# Patient Record
Sex: Female | Born: 1950 | Race: White | Hispanic: No | Smoking: Former smoker
Health system: Southern US, Community
[De-identification: ages and names within clinical notes are randomized; demographics above are authoritative.]

## PROBLEM LIST (undated history)

## (undated) DIAGNOSIS — E785 Hyperlipidemia, unspecified: Secondary | ICD-10-CM

## (undated) DIAGNOSIS — K219 Gastro-esophageal reflux disease without esophagitis: Secondary | ICD-10-CM

## (undated) DIAGNOSIS — I700 Atherosclerosis of aorta: Secondary | ICD-10-CM

## (undated) DIAGNOSIS — R911 Solitary pulmonary nodule: Secondary | ICD-10-CM

## (undated) DIAGNOSIS — I10 Essential (primary) hypertension: Secondary | ICD-10-CM

## (undated) DIAGNOSIS — Z8719 Personal history of other diseases of the digestive system: Secondary | ICD-10-CM

## (undated) DIAGNOSIS — D6851 Activated protein C resistance: Secondary | ICD-10-CM

## (undated) DIAGNOSIS — I2699 Other pulmonary embolism without acute cor pulmonale: Secondary | ICD-10-CM

## (undated) DIAGNOSIS — J439 Emphysema, unspecified: Secondary | ICD-10-CM

## (undated) DIAGNOSIS — I87002 Postthrombotic syndrome without complications of left lower extremity: Secondary | ICD-10-CM

## (undated) DIAGNOSIS — N12 Tubulo-interstitial nephritis, not specified as acute or chronic: Secondary | ICD-10-CM

## (undated) DIAGNOSIS — I341 Nonrheumatic mitral (valve) prolapse: Secondary | ICD-10-CM

## (undated) DIAGNOSIS — J90 Pleural effusion, not elsewhere classified: Secondary | ICD-10-CM

## (undated) DIAGNOSIS — Z1509 Genetic susceptibility to other malignant neoplasm: Secondary | ICD-10-CM

## (undated) DIAGNOSIS — D6859 Other primary thrombophilia: Secondary | ICD-10-CM

## (undated) DIAGNOSIS — I480 Paroxysmal atrial fibrillation: Secondary | ICD-10-CM

## (undated) DIAGNOSIS — R310 Gross hematuria: Secondary | ICD-10-CM

## (undated) DIAGNOSIS — E119 Type 2 diabetes mellitus without complications: Secondary | ICD-10-CM

## (undated) DIAGNOSIS — D689 Coagulation defect, unspecified: Secondary | ICD-10-CM

## (undated) DIAGNOSIS — T457X1A Poisoning by anticoagulant antagonists, vitamin K and other coagulants, accidental (unintentional), initial encounter: Secondary | ICD-10-CM

## (undated) HISTORY — DX: Genetic susceptibility to other malignant neoplasm: Z15.09

## (undated) HISTORY — DX: Other primary thrombophilia: D68.59

## (undated) HISTORY — DX: Postthrombotic syndrome without complications of left lower extremity: I87.002

## (undated) HISTORY — DX: Essential (primary) hypertension: I10

## (undated) HISTORY — DX: Hyperlipidemia, unspecified: E78.5

## (undated) HISTORY — DX: Pleural effusion, not elsewhere classified: J90

## (undated) HISTORY — DX: Type 2 diabetes mellitus without complications: E11.9

## (undated) HISTORY — DX: Gross hematuria: R31.0

## (undated) HISTORY — DX: Other pulmonary embolism without acute cor pulmonale: I26.99

## (undated) HISTORY — DX: Paroxysmal atrial fibrillation: I48.0

## (undated) HISTORY — DX: Tubulo-interstitial nephritis, not specified as acute or chronic: N12

## (undated) HISTORY — DX: Atherosclerosis of aorta: I70.0

## (undated) HISTORY — DX: Coagulation defect, unspecified: D68.9

## (undated) HISTORY — DX: Emphysema, unspecified: J43.9

## (undated) HISTORY — PX: CARDIAC CATHETERIZATION: SHX172

## (undated) HISTORY — PX: COLONOSCOPY: SHX174

## (undated) HISTORY — DX: Nonrheumatic mitral (valve) prolapse: I34.1

## (undated) HISTORY — DX: Poisoning by anticoagulant antagonists, vitamin k and other coagulants, accidental (unintentional), initial encounter: T45.7X1A

---

## 1973-01-21 HISTORY — PX: HYSTERECTOMY ABDOMINAL WITH SALPINGO-OOPHORECTOMY: SHX6792

## 1981-01-21 HISTORY — PX: LYMPH NODE BIOPSY: SHX201

## 1987-01-22 DIAGNOSIS — I469 Cardiac arrest, cause unspecified: Secondary | ICD-10-CM

## 1987-01-22 HISTORY — DX: Cardiac arrest, cause unspecified: I46.9

## 2003-02-27 ENCOUNTER — Emergency Department (HOSPITAL_COMMUNITY): Admission: EM | Admit: 2003-02-27 | Discharge: 2003-02-27 | Payer: Self-pay | Admitting: Emergency Medicine

## 2003-03-03 ENCOUNTER — Ambulatory Visit (HOSPITAL_COMMUNITY): Admission: RE | Admit: 2003-03-03 | Discharge: 2003-03-03 | Payer: Self-pay | Admitting: Unknown Physician Specialty

## 2004-01-07 ENCOUNTER — Emergency Department (HOSPITAL_COMMUNITY): Admission: EM | Admit: 2004-01-07 | Discharge: 2004-01-07 | Payer: Self-pay | Admitting: Family Medicine

## 2004-01-17 ENCOUNTER — Emergency Department (HOSPITAL_COMMUNITY): Admission: EM | Admit: 2004-01-17 | Discharge: 2004-01-17 | Payer: Self-pay | Admitting: Family Medicine

## 2006-09-04 ENCOUNTER — Encounter: Admission: RE | Admit: 2006-09-04 | Discharge: 2006-09-04 | Payer: Self-pay | Admitting: Internal Medicine

## 2012-06-08 ENCOUNTER — Emergency Department (INDEPENDENT_AMBULATORY_CARE_PROVIDER_SITE_OTHER)
Admission: EM | Admit: 2012-06-08 | Discharge: 2012-06-08 | Disposition: A | Discharge status: Home / Self Care | Payer: 59 | Source: Home / Self Care | Attending: Family Medicine | Admitting: Family Medicine

## 2012-06-08 ENCOUNTER — Encounter (HOSPITAL_COMMUNITY): Payer: Self-pay | Admitting: Emergency Medicine

## 2012-06-08 DIAGNOSIS — J40 Bronchitis, not specified as acute or chronic: Secondary | ICD-10-CM

## 2012-06-08 DIAGNOSIS — J309 Allergic rhinitis, unspecified: Secondary | ICD-10-CM

## 2012-06-08 LAB — POCT RAPID STREP A: Streptococcus, Group A Screen (Direct): NEGATIVE

## 2012-06-08 MED ORDER — CETIRIZINE-PSEUDOEPHEDRINE ER 5-120 MG PO TB12: 1.0000 | ORAL_TABLET | Freq: Two times a day (BID) | ORAL | Status: DC | PRN

## 2012-06-08 MED ORDER — PREDNISONE 20 MG PO TABS: ORAL_TABLET | ORAL | Status: DC

## 2012-06-08 MED ORDER — DOXYCYCLINE HYCLATE 100 MG PO CAPS: 100.0000 mg | ORAL_CAPSULE | Freq: Two times a day (BID) | ORAL | Status: DC

## 2012-06-08 MED ORDER — BENZONATATE 100 MG PO CAPS: 100.0000 mg | ORAL_CAPSULE | Freq: Three times a day (TID) | ORAL | Status: DC

## 2012-06-08 MED ORDER — GUAIFENESIN-CODEINE 100-10 MG/5ML PO SYRP: 5.0000 mL | ORAL_SOLUTION | Freq: Three times a day (TID) | ORAL | Status: DC | PRN

## 2012-06-08 MED ORDER — ALBUTEROL SULFATE HFA 108 (90 BASE) MCG/ACT IN AERS: 1.0000 | INHALATION_SPRAY | Freq: Four times a day (QID) | RESPIRATORY_TRACT | Status: DC | PRN

## 2012-06-08 NOTE — ED Notes (Signed)
Pt c/o uri/sinus sx onset Saturday Sx include: cough, sore throat, nasal congestion, itchy eyes, facial pressure, fevers Denies: v/n/d Taking mucinex w/no relief Smokes 0.5 PPD  She is alert and oriented w/no signs of acute distress.

## 2012-06-08 NOTE — ED Provider Notes (Signed)
History     CSN: [id number]  Arrival date & time 06/08/12  1431   First MD Initiated Contact with Patient 06/08/12 1537      Chief Complaint  Patient presents with  . URI    (Consider location/radiation/quality/duration/timing/severity/associated sxs/prior treatment) HPI Comments: 62 year old female smoker. Here complaining of nasal congestion, productive cough of a clear sputum. Sinus pressure. Sneezing watery/itchy eyes. Symptoms also associated with wheezing especially at nighttime. Patient taking Mucinex with no significant relief. Denies chest pain or shortness of breath. Reports she felt subjective fever last night. Patient still smoking. Interested in smoke infection. Appetite is normal. Activity level is normal. Has not taken any fever medications today and her temperature is 98.1 Fahrenheit.   History reviewed. No pertinent past medical history.  History reviewed. No pertinent past surgical history.  No family history on file.  History  Substance Use Topics  . Smoking status: Current Every Day Smoker -- 0.50 packs/day    Types: Cigarettes  . Smokeless tobacco: Not on file  . Alcohol Use: No    OB History   Grav Para Term Preterm Abortions TAB SAB Ect Mult Living                  Review of Systems  Constitutional: Negative for chills, diaphoresis, activity change and appetite change.  HENT: Positive for congestion, sore throat, rhinorrhea, sneezing and sinus pressure. Negative for trouble swallowing.   Eyes: Positive for itching. Negative for discharge.  Respiratory: Positive for cough and wheezing.   Cardiovascular: Negative for chest pain and leg swelling.  Gastrointestinal: Negative for nausea, vomiting, abdominal pain and diarrhea.  Skin: Positive for rash.  Allergic/Immunologic: Positive for environmental allergies.  All other systems reviewed and are negative.    Allergies  Review of patient's allergies indicates no known allergies.  Home  Medications   Current Outpatient Rx  Name  Route  Sig  Dispense  Refill  . albuterol (PROVENTIL HFA;VENTOLIN HFA) 108 (90 BASE) MCG/ACT inhaler   Inhalation   Inhale 1-2 puffs into the lungs every 6 (six) hours as needed for wheezing.   1 Inhaler   0   . benzonatate (TESSALON) 100 MG capsule   Oral   Take 1 capsule (100 mg total) by mouth every 8 (eight) hours.   21 capsule   0   . cetirizine-pseudoephedrine (ZYRTEC-D) 5-120 MG per tablet   Oral   Take 1 tablet by mouth 2 (two) times daily as needed for allergies or rhinitis.   30 tablet   0   . doxycycline (VIBRAMYCIN) 100 MG capsule   Oral   Take 1 capsule (100 mg total) by mouth 2 (two) times daily.   20 capsule   0   . guaiFENesin-codeine (ROBITUSSIN AC) 100-10 MG/5ML syrup   Oral   Take 5 mLs by mouth 3 (three) times daily as needed for cough.   120 mL   0   . predniSONE (DELTASONE) 20 MG tablet      2 tabs by mouth daily for 5 days   10 tablet   0     BP 133/75  Pulse 78  Temp(Src) 98.1 F (36.7 C) (Oral)  Resp 20  SpO2 96%  Physical Exam  Constitutional: She is oriented to person, place, and time. She appears well-developed and well-nourished. No distress.  HENT:  Head: Normocephalic and atraumatic.  Right Ear: External ear normal.  Left Ear: External ear normal.  Nasal Congestion with erythema and swelling of nasal  turbinates, clear rhinorrhea. pharyngeal erythema no exudates. No uvula deviation. No trismus. TM's normal  Neck: No JVD present.  Cardiovascular: Normal rate, regular rhythm and normal heart sounds.   Pulmonary/Chest:  No respiratory distress. No tachypnea. Expiratory rhonchi bilaterally. No active wheezing. No rales.  Neurological: She is alert and oriented to person, place, and time.  Skin: No rash noted. She is not diaphoretic.    ED Course  Procedures (including critical care time)  Labs Reviewed  POCT RAPID STREP A ([facility] CARE ONLY)   No results found.   1.  Bronchitis   2. Allergic rhinitis       MDM  Negative strep. Nontoxic appearance. Increased sputum production and bilateral expiratory rhonchi concerning for bronchitis. Prescribed albuterol, prednisone, doxycycline, cetirizine/pseudoephedrine, Tessalon Perles and guaifenesin/codeine. Encouraged smoking cessation. Provided with contact information for primary care offices in our area. Supportive care and red flags that should prompt her return to medical attention discussed with patient and provided in writing.        Sharin Grave, MD 06/08/12 [phone]

## 2016-03-14 DIAGNOSIS — B349 Viral infection, unspecified: Secondary | ICD-10-CM | POA: Diagnosis not present

## 2016-03-14 DIAGNOSIS — J029 Acute pharyngitis, unspecified: Secondary | ICD-10-CM | POA: Diagnosis not present

## 2017-08-11 ENCOUNTER — Other Ambulatory Visit (HOSPITAL_COMMUNITY): Payer: Self-pay | Admitting: Internal Medicine

## 2017-08-11 ENCOUNTER — Other Ambulatory Visit: Payer: Self-pay | Admitting: Internal Medicine

## 2017-08-11 DIAGNOSIS — J301 Allergic rhinitis due to pollen: Secondary | ICD-10-CM | POA: Diagnosis not present

## 2017-08-11 DIAGNOSIS — Z803 Family history of malignant neoplasm of breast: Secondary | ICD-10-CM | POA: Diagnosis not present

## 2017-08-11 DIAGNOSIS — Z1231 Encounter for screening mammogram for malignant neoplasm of breast: Secondary | ICD-10-CM

## 2017-08-11 DIAGNOSIS — Z720 Tobacco use: Secondary | ICD-10-CM | POA: Diagnosis not present

## 2017-08-11 DIAGNOSIS — I341 Nonrheumatic mitral (valve) prolapse: Secondary | ICD-10-CM | POA: Diagnosis not present

## 2017-08-11 DIAGNOSIS — Z1329 Encounter for screening for other suspected endocrine disorder: Secondary | ICD-10-CM | POA: Diagnosis not present

## 2017-08-11 DIAGNOSIS — Z136 Encounter for screening for cardiovascular disorders: Secondary | ICD-10-CM | POA: Diagnosis not present

## 2017-08-11 DIAGNOSIS — Z1239 Encounter for other screening for malignant neoplasm of breast: Secondary | ICD-10-CM | POA: Diagnosis not present

## 2017-08-11 DIAGNOSIS — Z8041 Family history of malignant neoplasm of ovary: Secondary | ICD-10-CM | POA: Diagnosis not present

## 2017-08-14 ENCOUNTER — Ambulatory Visit (HOSPITAL_COMMUNITY): Payer: Medicare HMO | Attending: Cardiology

## 2017-08-14 ENCOUNTER — Other Ambulatory Visit: Payer: Self-pay

## 2017-08-14 DIAGNOSIS — I341 Nonrheumatic mitral (valve) prolapse: Secondary | ICD-10-CM | POA: Diagnosis not present

## 2017-08-14 DIAGNOSIS — I472 Ventricular tachycardia: Secondary | ICD-10-CM | POA: Insufficient documentation

## 2017-08-14 DIAGNOSIS — I5189 Other ill-defined heart diseases: Secondary | ICD-10-CM | POA: Insufficient documentation

## 2017-08-14 DIAGNOSIS — I517 Cardiomegaly: Secondary | ICD-10-CM | POA: Diagnosis not present

## 2017-08-22 DIAGNOSIS — Z720 Tobacco use: Secondary | ICD-10-CM | POA: Diagnosis not present

## 2017-08-22 DIAGNOSIS — I341 Nonrheumatic mitral (valve) prolapse: Secondary | ICD-10-CM | POA: Diagnosis not present

## 2017-08-22 DIAGNOSIS — Z23 Encounter for immunization: Secondary | ICD-10-CM | POA: Diagnosis not present

## 2017-08-22 DIAGNOSIS — Z803 Family history of malignant neoplasm of breast: Secondary | ICD-10-CM | POA: Diagnosis not present

## 2017-08-26 ENCOUNTER — Other Ambulatory Visit: Payer: Self-pay | Admitting: Internal Medicine

## 2017-08-26 DIAGNOSIS — F17200 Nicotine dependence, unspecified, uncomplicated: Secondary | ICD-10-CM

## 2017-08-26 DIAGNOSIS — Z87891 Personal history of nicotine dependence: Secondary | ICD-10-CM

## 2017-09-02 ENCOUNTER — Ambulatory Visit
Admission: RE | Admit: 2017-09-02 | Discharge: 2017-09-02 | Disposition: A | Discharge status: Home / Self Care | Payer: Medicare HMO | Source: Ambulatory Visit | Attending: Internal Medicine | Admitting: Internal Medicine

## 2017-09-02 DIAGNOSIS — Z1231 Encounter for screening mammogram for malignant neoplasm of breast: Secondary | ICD-10-CM

## 2017-09-09 ENCOUNTER — Ambulatory Visit: Payer: Medicare HMO

## 2017-12-01 ENCOUNTER — Other Ambulatory Visit: Payer: Self-pay | Admitting: Internal Medicine

## 2017-12-01 DIAGNOSIS — Z0000 Encounter for general adult medical examination without abnormal findings: Secondary | ICD-10-CM | POA: Diagnosis not present

## 2017-12-01 DIAGNOSIS — I5189 Other ill-defined heart diseases: Secondary | ICD-10-CM | POA: Diagnosis not present

## 2017-12-01 DIAGNOSIS — Z8679 Personal history of other diseases of the circulatory system: Secondary | ICD-10-CM | POA: Diagnosis not present

## 2017-12-01 DIAGNOSIS — E2839 Other primary ovarian failure: Secondary | ICD-10-CM

## 2017-12-01 DIAGNOSIS — Z1211 Encounter for screening for malignant neoplasm of colon: Secondary | ICD-10-CM | POA: Diagnosis not present

## 2017-12-01 DIAGNOSIS — Z1389 Encounter for screening for other disorder: Secondary | ICD-10-CM | POA: Diagnosis not present

## 2017-12-01 DIAGNOSIS — E559 Vitamin D deficiency, unspecified: Secondary | ICD-10-CM | POA: Diagnosis not present

## 2017-12-01 DIAGNOSIS — Z119 Encounter for screening for infectious and parasitic diseases, unspecified: Secondary | ICD-10-CM | POA: Diagnosis not present

## 2017-12-01 DIAGNOSIS — Z720 Tobacco use: Secondary | ICD-10-CM | POA: Diagnosis not present

## 2018-02-03 ENCOUNTER — Ambulatory Visit
Admission: RE | Admit: 2018-02-03 | Discharge: 2018-02-03 | Disposition: A | Discharge status: Home / Self Care | Payer: Medicare HMO | Source: Ambulatory Visit | Attending: Internal Medicine | Admitting: Internal Medicine

## 2018-02-03 DIAGNOSIS — M8589 Other specified disorders of bone density and structure, multiple sites: Secondary | ICD-10-CM | POA: Diagnosis not present

## 2018-02-03 DIAGNOSIS — E2839 Other primary ovarian failure: Secondary | ICD-10-CM

## 2018-02-03 DIAGNOSIS — Z780 Asymptomatic menopausal state: Secondary | ICD-10-CM | POA: Diagnosis not present

## 2018-02-10 DIAGNOSIS — Z720 Tobacco use: Secondary | ICD-10-CM | POA: Diagnosis not present

## 2018-02-10 DIAGNOSIS — M8580 Other specified disorders of bone density and structure, unspecified site: Secondary | ICD-10-CM | POA: Diagnosis not present

## 2018-02-10 DIAGNOSIS — Z6379 Other stressful life events affecting family and household: Secondary | ICD-10-CM | POA: Diagnosis not present

## 2018-11-16 ENCOUNTER — Other Ambulatory Visit: Payer: Self-pay

## 2018-11-16 ENCOUNTER — Ambulatory Visit (INDEPENDENT_AMBULATORY_CARE_PROVIDER_SITE_OTHER): Payer: Medicare HMO | Admitting: Internal Medicine

## 2018-11-16 ENCOUNTER — Encounter: Payer: Self-pay | Admitting: Internal Medicine

## 2018-11-16 VITALS — BP 181/88 | HR 80 | Temp 98.2°F | Ht 64.0 in | Wt 165.4 lb

## 2018-11-16 DIAGNOSIS — I10 Essential (primary) hypertension: Secondary | ICD-10-CM | POA: Diagnosis not present

## 2018-11-16 DIAGNOSIS — Z634 Disappearance and death of family member: Secondary | ICD-10-CM

## 2018-11-16 DIAGNOSIS — Z1509 Genetic susceptibility to other malignant neoplasm: Secondary | ICD-10-CM

## 2018-11-16 DIAGNOSIS — F17210 Nicotine dependence, cigarettes, uncomplicated: Secondary | ICD-10-CM

## 2018-11-16 DIAGNOSIS — M7989 Other specified soft tissue disorders: Secondary | ICD-10-CM

## 2018-11-16 DIAGNOSIS — I87002 Postthrombotic syndrome without complications of left lower extremity: Secondary | ICD-10-CM | POA: Insufficient documentation

## 2018-11-16 DIAGNOSIS — I341 Nonrheumatic mitral (valve) prolapse: Secondary | ICD-10-CM | POA: Diagnosis not present

## 2018-11-16 DIAGNOSIS — Z8041 Family history of malignant neoplasm of ovary: Secondary | ICD-10-CM

## 2018-11-16 DIAGNOSIS — Z8049 Family history of malignant neoplasm of other genital organs: Secondary | ICD-10-CM | POA: Diagnosis not present

## 2018-11-16 DIAGNOSIS — Z803 Family history of malignant neoplasm of breast: Secondary | ICD-10-CM

## 2018-11-16 DIAGNOSIS — Z800 Family history of malignant neoplasm of digestive organs: Secondary | ICD-10-CM | POA: Diagnosis not present

## 2018-11-16 DIAGNOSIS — Z720 Tobacco use: Secondary | ICD-10-CM | POA: Insufficient documentation

## 2018-11-16 HISTORY — DX: Disappearance and death of family member: Z63.4

## 2018-11-16 LAB — BRAIN NATRIURETIC PEPTIDE: B Natriuretic Peptide: 45.7 pg/mL (ref 0.0–100.0)

## 2018-11-16 NOTE — Progress Notes (Signed)
   CC: Lower extremity swelling   HPI:  [name] is a 68 y.o. female with PMHx listed below presenting for lower extremity swelling. Please see the A&P for the status of the patient's chronic medical problems.  No past medical history on file.   Family History  Problem Relation Age of Onset  . Diabetes Mother   . Diabetes Father   . CAD Father   . Hypothyroidism Sister   . Melanoma Sister   . Ovarian cancer Other   . Uterine cancer Other   . Breast cancer Other   . Diabetes Son   . Uterine cancer Niece   . Pancreatic cancer Nephew     Past Surgical History:  Procedure Laterality Date  . 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 She has two twin boys, five grandchildren, and two great-grandchildren. One of her sons, his 61 year old daughter, and her two grandchildren ages one in two live with her. She is widowed. She initially had one brothers and four sister however one of her sisters has passed one of her brothers has passed.  She is a current smoker. She smokes one half pack per day. She denies use of alcohol or illicit substances.  Review of Systems:  Performed and all others negative.  Physical Exam: Vitals:   11/16/18 1312  BP: (!) 181/88  Pulse: 80  Temp: 98.2 F (36.8 C)  TempSrc: Oral  SpO2: 97%  Weight: 165 lb 6.4 oz (75 kg)  Height: 5\' 4"  (1.626 m)   General: Well nourished female in no acute distress HENT: Normocephalic, atraumatic, moist mucus membranes Pulm: Good air movement with no wheezing or crackles  CV: RRR, no murmurs, no rubs  Abdomen: Active bowel sounds, soft, non-distended, no tenderness to palpation  Extremities: Pulses palpable in all extremities, bilateral pitting edema to the knees Skin: Warm and dry  Neuro: Alert and oriented x 3  Assessment & Plan:   See Encounters Tab for problem based charting.  Patient discussed with Dr.  Lynnae January

## 2018-11-16 NOTE — Assessment & Plan Note (Addendum)
Patient noted to have a high blood pressure on physical exam. She states that she has never been told she had hypertension but does note that she was diagnosed with preeclampsia at 68 y.o. she has never been on a blood pressure medication. She denies signs or symptoms of an organ damage including memory changes, visual changes, headaches, shortness of breath, chest pain, abdominal pain, changes in urine.  A/P: - We discussed that given her degree of elevation she will likely need blood pressure medication. She will come back in one week for nursing blood pressure check which point we will likely need further medication if it is elevated. - Check BMP and protein to creatinine ratio

## 2018-11-16 NOTE — Patient Instructions (Addendum)
Thank you for allowing [place] to provide your care. Today we discussed:  1) Family history of cancer. Please let me know if you're interested in seeing a genetic counselor and to receive further testing.  2) Hypertension. Your blood pressure was elevated today. We will have you back in one week for blood pressure check. If it is high at that point we will need to start medications.  3) Lower extremity swelling. We're gonna check some blood work today to determine what is causing your lower extremity swelling. I will call you when I have the results and we will discuss if there is anything further we need to do.  Please come back in one week for blood pressure check.

## 2018-11-17 DIAGNOSIS — Z1509 Genetic susceptibility to other malignant neoplasm: Secondary | ICD-10-CM | POA: Insufficient documentation

## 2018-11-17 LAB — CMP14 + ANION GAP
ALT: 20 IU/L (ref 0–32)
AST: 16 IU/L (ref 0–40)
Albumin/Globulin Ratio: 2 (ref 1.2–2.2)
Albumin: 4.1 g/dL (ref 3.8–4.8)
Alkaline Phosphatase: 81 IU/L (ref 39–117)
Anion Gap: 13 mmol/L (ref 10.0–18.0)
BUN/Creatinine Ratio: 22 (ref 12–28)
BUN: 17 mg/dL (ref 8–27)
Bilirubin Total: 0.3 mg/dL (ref 0.0–1.2)
CO2: 25 mmol/L (ref 20–29)
Calcium: 9.8 mg/dL (ref 8.7–10.3)
Chloride: 103 mmol/L (ref 96–106)
Creatinine, Ser: 0.78 mg/dL (ref 0.57–1.00)
GFR calc Af Amer: 90 mL/min/{1.73_m2} (ref >59)
GFR calc non Af Amer: 78 mL/min/{1.73_m2} (ref >59)
Globulin, Total: 2.1 g/dL (ref 1.5–4.5)
Glucose: 86 mg/dL (ref 65–99)
Potassium: 5.2 mmol/L (ref 3.5–5.2)
Sodium: 141 mmol/L (ref 134–144)
Total Protein: 6.2 g/dL (ref 6.0–8.5)

## 2018-11-17 LAB — LIPID PANEL
Chol/HDL Ratio: 2.9 ratio (ref 0.0–4.4)
Cholesterol, Total: 168 mg/dL (ref 100–199)
HDL: 58 mg/dL (ref >39)
LDL Chol Calc (NIH): 85 mg/dL (ref 0–99)
Triglycerides: 146 mg/dL (ref 0–149)
VLDL Cholesterol Cal: 25 mg/dL (ref 5–40)

## 2018-11-17 LAB — PROTEIN / CREATININE RATIO, URINE
Creatinine, Urine: 43.7 mg/dL
Protein, Ur: <4 mg/dL

## 2018-11-17 LAB — HEMOGLOBIN A1C
Est. average glucose Bld gHb Est-mCnc: 131 mg/dL
Hgb A1c MFr Bld: 6.2 % — ABNORMAL HIGH (ref 4.8–5.6)

## 2018-11-17 NOTE — Assessment & Plan Note (Signed)
Patient with history of mitral valve prolapse noted on previous echo in 2019. Is not further imaging since that time. She denies shortness of breath, orthopnea, or chest pain.

## 2018-11-17 NOTE — Assessment & Plan Note (Signed)
Patient has had a lot of family losses over the past year. She lost her brother in September and one of her sisters was recently diagnosed with metastatic breast cancer. She is not had any changes in sleep, interest, feelings of guilt, energy, or concentration. She is a good family support. She states that her son and his 69 year old daughter lives with her. In addition she is one-year-old and a two-year-old living with her.  A/P: - She does not meet the definition her major depressive disorder. - Continue to monitor

## 2018-11-17 NOTE — Assessment & Plan Note (Signed)
Currently smokes 1/2 pack per day. She is not interested in quitting at this point. Advised for cessation.

## 2018-11-17 NOTE — Assessment & Plan Note (Signed)
Patient presents for evaluation of lower extremity swelling. She states that is been present for the past 2 to 3 weeks. She notes that sometimes it does improve with elevation of her legs but other times it does not. She is unable to speak to whether she has any orthopnea as she sleeps with 2 to 3 pillows at night. She denies chest pain or shortness of breath she denies a significant alcohol use history. She denies changes in urine output.  A/P: - Check BNP, CMP, and protein to creatinine ratio - May need repeat echo - Symptoms are cosmetic. Will hold on diuretic therapy at this point.

## 2018-11-17 NOTE — Assessment & Plan Note (Signed)
On review of family history patient's mother was diagnosed with ovarian cancer, a niece was diagnosed with uterine cancer, and nephew was diagnosed with pancreatic cancer, and his sister has been diagnosed with breast cancer. She is never had genetic counseling. She is unsure if any of her relatives have been tested. We discussed that given her family history she may need referral to genetic counseling. She currently has five granddaughters and one great granddaughter. She will discuss it with her granddaughters and let us know if she like to be referred to genetic counseling.

## 2018-11-18 ENCOUNTER — Telehealth: Payer: Self-pay | Admitting: Internal Medicine

## 2018-11-18 DIAGNOSIS — M7989 Other specified soft tissue disorders: Secondary | ICD-10-CM

## 2018-11-18 MED ORDER — FUROSEMIDE 20 MG PO TABS: 20.0000 mg | ORAL_TABLET | Freq: Every day | ORAL | 0 refills | Status: DC | PRN

## 2018-11-18 NOTE — Telephone Encounter (Signed)
Called the patient to discuss her lab work. We will start furosemide 20 mg once daily. We discussed her elevated A1c and the need to repeat it at her next visit. She eats a lot of candy and she is gonna try to decrease her candy intake. She will be by Monday for blood pressure check. I which point if her blood pressure is still elevated we will need to start medications. All questions and concerns addressed.  Ina Homes, MD

## 2018-11-18 NOTE — Progress Notes (Signed)
Internal Medicine Clinic Attending  Case discussed with Dr. Helberg at the time of the visit.  We reviewed the resident's history and exam and pertinent patient test results.  I agree with the assessment, diagnosis, and plan of care documented in the resident's note.    

## 2018-11-23 ENCOUNTER — Encounter: Payer: Self-pay | Admitting: Internal Medicine

## 2018-11-23 ENCOUNTER — Ambulatory Visit (INDEPENDENT_AMBULATORY_CARE_PROVIDER_SITE_OTHER): Payer: Medicare HMO | Admitting: Internal Medicine

## 2018-11-23 ENCOUNTER — Other Ambulatory Visit: Payer: Self-pay

## 2018-11-23 VITALS — BP 168/79 | HR 83 | Temp 97.9°F | Ht 64.0 in | Wt 163.0 lb

## 2018-11-23 DIAGNOSIS — I10 Essential (primary) hypertension: Secondary | ICD-10-CM

## 2018-11-23 DIAGNOSIS — R7303 Prediabetes: Secondary | ICD-10-CM

## 2018-11-23 DIAGNOSIS — Z23 Encounter for immunization: Secondary | ICD-10-CM

## 2018-11-23 MED ORDER — LISINOPRIL 10 MG PO TABS: 10.0000 mg | ORAL_TABLET | Freq: Every day | ORAL | 2 refills | Status: DC

## 2018-11-23 NOTE — Progress Notes (Signed)
   CC: Hypertension follow-up  HPI: Patient is a 68 year old female with past medical history of prediabetes and grade 1 diastolic dysfunction who presents for follow-up on elevated blood pressure.  [name] is a 68 y.o.   No past medical history on file. Review of Systems:   Review of Systems  Respiratory: Negative for shortness of breath.   Cardiovascular: Negative for chest pain.  Gastrointestinal: Negative for abdominal pain.  Neurological: Negative for headaches.  All other systems reviewed and are negative.  Physical Exam:  Vitals:   11/23/18 1459  BP: (!) 168/79  Pulse: 83  Temp: 97.9 F (36.6 C)  TempSrc: Oral  SpO2: 95%  Weight: 163 lb (73.9 kg)  Height: 5\' 4"  (1.626 m)   Physical Exam  Constitutional: She is well-developed, well-nourished, and in no distress.  HENT:  Head: Normocephalic and atraumatic.  Eyes: EOM are normal. Right eye exhibits no discharge. Left eye exhibits no discharge.  Neck: Normal range of motion. No tracheal deviation present.  Cardiovascular: Normal rate and regular rhythm. Exam reveals no gallop and no friction rub.  No murmur heard. Pulmonary/Chest: Effort normal and breath sounds normal. No respiratory distress. She has no wheezes. She has no rales.  Abdominal: Soft. She exhibits no distension. There is no abdominal tenderness. There is no rebound and no guarding.  Musculoskeletal: Normal range of motion.        General: Edema (Trace peripheral edema of bilateral ankles) present. No tenderness or deformity.  Neurological: She is alert. Coordination normal.  Skin: Skin is warm and dry. No rash noted. She is not diaphoretic. No erythema.  Psychiatric: Memory and judgment normal.    Assessment & Plan:   See Encounters Tab for problem based charting.  Patient seen and discussed with Dr. Philipp Ovens

## 2018-11-23 NOTE — Assessment & Plan Note (Addendum)
Patient with blood pressure of 181/88 at last visit.  Readings today of 168/79 and 163/81 on repeat.  Patient without signs/symptoms of hypertension related organ damage including headache, vision changes, chest pain, shortness of breath, abdominal pain.  Patient states she is trying to modify her diet to lose weight.  Patient likely to benefit from antihypertensive therapy, lisinopril selected due to prediabetic A1c.  Patient with undetectable urine protein excretion, assessed at last visit. * Prescription sent for lisinopril 10 mg daily * Patient advised on benefits of weight loss and low-salt diet * Follow-up appointment in 4 weeks to check creatinine and adjust dose as necessary

## 2018-11-23 NOTE — Patient Instructions (Addendum)
You were seen today for follow-up on your blood pressure.  Given your elevated blood pressure, we think he would benefit from starting medication.  We sent a prescription for lisinopril to your pharmacy.  Want you to follow-up in 4 weeks for some blood work to check your kidney function.  Your blood pressure can definitely be decreased with diet and exercise as well.  If your blood pressure starts to improve with weight loss we could potentially decrease or stop the medication. A very small percentage of people can experience lip or facial swelling from this medicine.  Stop taking the medication and call the clinic immediately if you experience this symptom.    Thank you for allowing [place] to be part of your medical care

## 2018-11-24 NOTE — Progress Notes (Signed)
Internal Medicine Clinic Attending  I saw and evaluated the patient.  I personally confirmed the key portions of the history and exam documented by Dr. MacLean and I reviewed pertinent patient test results.  The assessment, diagnosis, and plan were formulated together and I agree with the documentation in the resident's note.  

## 2018-12-10 ENCOUNTER — Encounter: Payer: Self-pay | Admitting: *Deleted

## 2018-12-14 ENCOUNTER — Encounter: Payer: Medicare HMO | Admitting: Internal Medicine

## 2018-12-21 ENCOUNTER — Ambulatory Visit (INDEPENDENT_AMBULATORY_CARE_PROVIDER_SITE_OTHER): Payer: Medicare HMO | Admitting: Internal Medicine

## 2018-12-21 ENCOUNTER — Other Ambulatory Visit: Payer: Self-pay

## 2018-12-21 ENCOUNTER — Encounter: Payer: Medicare HMO | Admitting: Internal Medicine

## 2018-12-21 ENCOUNTER — Encounter: Payer: Self-pay | Admitting: Internal Medicine

## 2018-12-21 VITALS — BP 147/71 | HR 79 | Temp 97.6°F | Ht 64.0 in | Wt 164.5 lb

## 2018-12-21 DIAGNOSIS — Z79899 Other long term (current) drug therapy: Secondary | ICD-10-CM | POA: Diagnosis not present

## 2018-12-21 DIAGNOSIS — I10 Essential (primary) hypertension: Secondary | ICD-10-CM

## 2018-12-21 LAB — BASIC METABOLIC PANEL
Anion gap: 8 (ref 5–15)
BUN: 15 mg/dL (ref 8–23)
CO2: 28 mmol/L (ref 22–32)
Calcium: 9.2 mg/dL (ref 8.9–10.3)
Chloride: 104 mmol/L (ref 98–111)
Creatinine, Ser: 0.78 mg/dL (ref 0.44–1.00)
GFR calc Af Amer: >60 mL/min (ref >60)
GFR calc non Af Amer: >60 mL/min (ref >60)
Glucose, Bld: 113 mg/dL — ABNORMAL HIGH (ref 70–99)
Potassium: 4.1 mmol/L (ref 3.5–5.1)
Sodium: 140 mmol/L (ref 135–145)

## 2018-12-21 NOTE — Patient Instructions (Addendum)
Kimberly Walters,  It was a pleasure meeting you this afternoon! Today we discussed your blood pressure. Your at home measurements appear to be well controlled. Today we will draw some blood to see how your kidney's are doing. We will increase your medication from 10 mg to 20 mg, so please take 2 pills daily. If you notice any side effects such as dizziness or lightheadedness, please call the clinic. It was a pleasure working with you, have an enjoyable day!  Sincerely,  Maudie Mercury, MD

## 2018-12-21 NOTE — Assessment & Plan Note (Addendum)
Patient arrived to the clinic today for F/U on her hypertension. BP today was 147/71 with a HR of 79. Patient did not bring her BP journal, but states her systolic pressures have been in the 120-130s with two pressures in the 140s. Her diastolic pressures have been in the 70s. Patient states that she has been compliant with her 10mg  Zestril QD. Patient denies side effects of her medication. Patient is in good health, overall. To further reduce her risk of CAD, CKD, and stroke, her zestril dose will be increased to 20 mg QD for tighter control of her blood pressure.   Plan:  - Increase Zestril  To 20 mg QD.  - Patient educated on side effects of Zestril and will reach out to the clinic if she develops any adverse effects.  - BMP to assess Cr level.

## 2018-12-21 NOTE — Progress Notes (Signed)
   [id number]  HPI:  [name] is a 68 y.o. , with a PMH noted below, who presents to the clinic for a follow up on her hypertension. To see the management of her acute and chronic conditions, please see the A&P note under the encounter tab.   Past Medical History:  Diagnosis Date  . Hypertension    Review of Systems:   Review of Systems  Constitutional: Negative for malaise/fatigue and weight loss.  Eyes: Negative for blurred vision.  Respiratory: Negative for shortness of breath.   Cardiovascular: Negative for chest pain and palpitations.  Gastrointestinal: Negative for abdominal pain, blood in stool, constipation, diarrhea, melena, nausea and vomiting.  Musculoskeletal: Negative for myalgias.  Neurological: Negative for dizziness and headaches.     Physical Exam:  Vitals:   12/21/18 1330  BP: (!) 147/71  Pulse: 79  Temp: 97.6 F (36.4 C)  TempSrc: Oral  SpO2: 96%  Weight: 164 lb 8 oz (74.6 kg)  Height: 5\' 4"  (1.626 m)   Physical Exam Vitals signs and nursing note reviewed.  Constitutional:      General: She is not in acute distress.    Appearance: Normal appearance. She is not ill-appearing or toxic-appearing.     Comments: Patient sitting in her chair comfortably, no noticeable acute distress.   HENT:     Head: Normocephalic and atraumatic.  Eyes:     General:        Right eye: No discharge.        Left eye: No discharge.     Conjunctiva/sclera: Conjunctivae normal.  Cardiovascular:     Rate and Rhythm: Normal rate and regular rhythm.     Pulses: Normal pulses.     Heart sounds: Normal heart sounds. No murmur. No friction rub. No gallop.   Pulmonary:     Effort: Pulmonary effort is normal.     Breath sounds: Normal breath sounds. No wheezing, rhonchi or rales.  Abdominal:     General: Bowel sounds are normal.     Palpations: Abdomen is soft.     Tenderness: There is no abdominal tenderness. There is no guarding.  Musculoskeletal:      General: No swelling or tenderness.     Right lower leg: No edema.     Left lower leg: No edema.  Neurological:     General: No focal deficit present.     Mental Status: She is alert and oriented to person, place, and time.  Psychiatric:        Mood and Affect: Mood normal.        Behavior: Behavior normal.      Assessment & Plan:   See Encounters Tab for problem based charting.  Patient seen with Dr. Evette Doffing

## 2018-12-23 NOTE — Progress Notes (Signed)
Internal Medicine Clinic Attending  I saw and evaluated the patient.  I personally confirmed the key portions of the history and exam documented by Dr. Winters and I reviewed pertinent patient test results.  The assessment, diagnosis, and plan were formulated together and I agree with the documentation in the resident's note.  

## 2019-01-11 ENCOUNTER — Encounter: Payer: Medicare HMO | Admitting: Internal Medicine

## 2019-01-12 ENCOUNTER — Other Ambulatory Visit: Payer: Self-pay | Admitting: Internal Medicine

## 2019-01-12 DIAGNOSIS — M7989 Other specified soft tissue disorders: Secondary | ICD-10-CM

## 2019-01-20 ENCOUNTER — Other Ambulatory Visit: Payer: Self-pay | Admitting: Internal Medicine

## 2019-01-20 DIAGNOSIS — I10 Essential (primary) hypertension: Secondary | ICD-10-CM

## 2019-01-20 MED ORDER — LISINOPRIL 10 MG PO TABS: 10.0000 mg | ORAL_TABLET | Freq: Every day | ORAL | 0 refills | Status: DC

## 2019-01-20 NOTE — Telephone Encounter (Signed)
Need refill on blood pressure medicine ; pt contact [phone]   [id number] - [place], [place] [address]

## 2019-01-22 DIAGNOSIS — N12 Tubulo-interstitial nephritis, not specified as acute or chronic: Secondary | ICD-10-CM

## 2019-01-22 HISTORY — DX: Tubulo-interstitial nephritis, not specified as acute or chronic: N12

## 2019-02-16 ENCOUNTER — Other Ambulatory Visit: Payer: Self-pay | Admitting: Internal Medicine

## 2019-02-16 DIAGNOSIS — I10 Essential (primary) hypertension: Secondary | ICD-10-CM

## 2019-03-15 ENCOUNTER — Other Ambulatory Visit: Payer: Self-pay

## 2019-03-15 ENCOUNTER — Ambulatory Visit (INDEPENDENT_AMBULATORY_CARE_PROVIDER_SITE_OTHER): Payer: Medicare HMO | Admitting: Internal Medicine

## 2019-03-15 VITALS — BP 121/80 | HR 94 | Temp 98.4°F | Wt 165.2 lb

## 2019-03-15 DIAGNOSIS — R1013 Epigastric pain: Secondary | ICD-10-CM | POA: Diagnosis not present

## 2019-03-15 DIAGNOSIS — Z79899 Other long term (current) drug therapy: Secondary | ICD-10-CM

## 2019-03-15 DIAGNOSIS — R1084 Generalized abdominal pain: Secondary | ICD-10-CM

## 2019-03-15 DIAGNOSIS — R1011 Right upper quadrant pain: Secondary | ICD-10-CM | POA: Diagnosis not present

## 2019-03-15 DIAGNOSIS — I10 Essential (primary) hypertension: Secondary | ICD-10-CM | POA: Diagnosis not present

## 2019-03-15 DIAGNOSIS — R140 Abdominal distension (gaseous): Secondary | ICD-10-CM

## 2019-03-15 DIAGNOSIS — Z1211 Encounter for screening for malignant neoplasm of colon: Secondary | ICD-10-CM

## 2019-03-15 MED ORDER — OMEPRAZOLE 20 MG PO CPDR: 20.0000 mg | DELAYED_RELEASE_CAPSULE | Freq: Every day | ORAL | 0 refills | Status: DC

## 2019-03-15 NOTE — Patient Instructions (Signed)
Ms. Arps, It was a pleasure seeing you!  Your blood pressure looks great today! Keep up the good work. We do not need to make any medication changes.   We also discussed your abdominal pain/bloating after meals. I'd like you to try a medicine called omeprazole once daily. I'll plan to give you a goal in 6-8 weeks to see if you've had any improvement. I'm also going to get an ultrasound of your gallbladder and will let you know when I have these results.   We will give you a stool testing kit today to perform as your colon cancer screening.   Take care, Dr. Koleen Distance

## 2019-03-16 ENCOUNTER — Encounter: Payer: Self-pay | Admitting: Internal Medicine

## 2019-03-16 DIAGNOSIS — Z0000 Encounter for general adult medical examination without abnormal findings: Secondary | ICD-10-CM | POA: Insufficient documentation

## 2019-03-16 DIAGNOSIS — R1084 Generalized abdominal pain: Secondary | ICD-10-CM | POA: Insufficient documentation

## 2019-03-16 DIAGNOSIS — Z1211 Encounter for screening for malignant neoplasm of colon: Secondary | ICD-10-CM | POA: Insufficient documentation

## 2019-03-16 MED ORDER — LISINOPRIL 20 MG PO TABS: 20.0000 mg | ORAL_TABLET | Freq: Every day | ORAL | 2 refills | Status: DC

## 2019-03-16 NOTE — Progress Notes (Signed)
   CC: HTN, abdominal pain   HPI:  Ms.Stephan L Blackner is a 69 y.o. female with history of HTN who presents for follow-up on chronic HTN and acute complaint of abdominal pain and bloating.   Past Medical History:  Diagnosis Date  . Hypertension    Review of Systems:  Review of Systems  Constitutional: Negative for chills, fever and weight loss.  HENT: Negative for hearing loss.   Eyes: Negative for blurred vision.  Respiratory: Negative for cough and shortness of breath.   Cardiovascular: Negative for chest pain, palpitations and leg swelling.  Gastrointestinal: Positive for abdominal pain and heartburn. Negative for blood in stool, constipation, diarrhea, melena, nausea and vomiting.  Genitourinary: Negative for dysuria.  Musculoskeletal: Negative for falls.  Skin: Negative for rash.  Neurological: Negative for dizziness, sensory change, focal weakness and headaches.    Physical Exam:  Vitals:   03/15/19 1326  BP: 121/80  Pulse: 94  Temp: 98.4 F (36.9 C)  TempSrc: Oral  SpO2: 94%  Weight: 165 lb 3.2 oz (74.9 kg)   Physical Exam Constitutional:      General: She is not in acute distress.    Appearance: She is well-developed.  Eyes:     General: No scleral icterus. Cardiovascular:     Rate and Rhythm: Normal rate and regular rhythm.     Heart sounds: Normal heart sounds.  Pulmonary:     Effort: Pulmonary effort is normal.     Breath sounds: Normal breath sounds.  Abdominal:     General: Abdomen is protuberant. Bowel sounds are normal. There is no distension.     Palpations: Abdomen is soft.     Tenderness: There is abdominal tenderness in the right upper quadrant and epigastric area. Positive signs include Murphy's sign.  Skin:    General: Skin is warm and dry.  Neurological:     General: No focal deficit present.     Mental Status: She is alert and oriented to person, place, and time.  Psychiatric:        Mood and Affect: Mood normal.        Behavior: Behavior  normal.      Assessment & Plan:   See Encounters Tab for problem based charting.  Patient discussed with Dr. Lynnae January

## 2019-03-16 NOTE — Assessment & Plan Note (Addendum)
Patient reports 2 month history of postprandial abdominal pain described as bloating. Symptoms worse with spicy foods or foods with higher fat content. She endorses associated belching, heartburn, and occasional bad taste in her mouth. She denies fevers, chills, dysphagia, weight loss, vomiting, changes in stools.  On exam, she has epigastric and RUQ pain with a positive Murphy's. CMP at last visit revealed normal liver function and alk phos. Her symptoms seem more consistent with GERD so will start treatment with daily PPI. Will also evaluate her gallbladder with RUQ u/s given her postprandial pain and exam findings.  F/U with telehealth visit in 6 weeks to see if she has gotten any improvement on PPI.

## 2019-03-16 NOTE — Assessment & Plan Note (Signed)
Patient would prefer to avoid colonoscopy if possible, but is willing to get one if absolutely necessary. We agreed to initiate colon cancer screening with [employer] testing and only pursue colonoscopy if further indicated.

## 2019-03-16 NOTE — Assessment & Plan Note (Signed)
This problem is chronic and stable. Blood pressure improved since increasing Lisinopril dose at last visit. Tolerating medication well. Hope blood pressure readings range from [id number] systolic. Will continue 20 mg daily.

## 2019-03-19 NOTE — Progress Notes (Signed)
Internal Medicine Clinic Attending  Case discussed with Dr. Bloomfield at the time of the visit.  We reviewed the resident's history and exam and pertinent patient test results.  I agree with the assessment, diagnosis, and plan of care documented in the resident's note.  

## 2019-03-19 NOTE — Addendum Note (Signed)
Addended by: Modena Nunnery D on: 03/19/2019 12:47 PM   Modules accepted: Level of Service

## 2019-04-02 DIAGNOSIS — H5203 Hypermetropia, bilateral: Secondary | ICD-10-CM | POA: Diagnosis not present

## 2019-04-04 ENCOUNTER — Other Ambulatory Visit: Payer: Self-pay | Admitting: Internal Medicine

## 2019-04-04 DIAGNOSIS — M7989 Other specified soft tissue disorders: Secondary | ICD-10-CM

## 2019-04-07 ENCOUNTER — Ambulatory Visit (HOSPITAL_COMMUNITY): Payer: Medicare HMO

## 2019-04-10 ENCOUNTER — Other Ambulatory Visit: Payer: Self-pay | Admitting: Internal Medicine

## 2019-04-10 DIAGNOSIS — I10 Essential (primary) hypertension: Secondary | ICD-10-CM

## 2019-04-28 ENCOUNTER — Other Ambulatory Visit: Payer: Self-pay

## 2019-04-28 ENCOUNTER — Encounter: Payer: Self-pay | Admitting: Internal Medicine

## 2019-04-28 ENCOUNTER — Ambulatory Visit: Payer: Medicare HMO | Admitting: Internal Medicine

## 2019-04-28 DIAGNOSIS — K219 Gastro-esophageal reflux disease without esophagitis: Secondary | ICD-10-CM

## 2019-04-28 NOTE — Progress Notes (Signed)
Attempted to reach Kimberly Walters on home and work phone multiple times without success.

## 2019-05-03 ENCOUNTER — Other Ambulatory Visit: Payer: Self-pay | Admitting: Student in an Organized Health Care Education/Training Program

## 2019-05-03 DIAGNOSIS — Z1231 Encounter for screening mammogram for malignant neoplasm of breast: Secondary | ICD-10-CM

## 2019-06-02 ENCOUNTER — Encounter: Payer: Self-pay | Admitting: *Deleted

## 2019-06-02 NOTE — Progress Notes (Unsigned)

## 2019-07-12 ENCOUNTER — Other Ambulatory Visit: Payer: Self-pay

## 2019-07-12 ENCOUNTER — Encounter (HOSPITAL_COMMUNITY): Payer: Self-pay

## 2019-07-12 ENCOUNTER — Inpatient Hospital Stay (HOSPITAL_COMMUNITY)
Admission: EM | Admit: 2019-07-12 | Discharge: 2019-07-16 | DRG: 175 | Disposition: A | Discharge status: Home Health | Payer: Medicare HMO | Attending: Internal Medicine | Admitting: Internal Medicine

## 2019-07-12 ENCOUNTER — Emergency Department (HOSPITAL_COMMUNITY): Payer: Medicare HMO

## 2019-07-12 DIAGNOSIS — R0789 Other chest pain: Secondary | ICD-10-CM | POA: Diagnosis not present

## 2019-07-12 DIAGNOSIS — J9601 Acute respiratory failure with hypoxia: Secondary | ICD-10-CM | POA: Diagnosis not present

## 2019-07-12 DIAGNOSIS — R0602 Shortness of breath: Secondary | ICD-10-CM | POA: Diagnosis not present

## 2019-07-12 DIAGNOSIS — Z23 Encounter for immunization: Secondary | ICD-10-CM

## 2019-07-12 DIAGNOSIS — R042 Hemoptysis: Secondary | ICD-10-CM | POA: Diagnosis not present

## 2019-07-12 DIAGNOSIS — Z20822 Contact with and (suspected) exposure to covid-19: Secondary | ICD-10-CM | POA: Diagnosis present

## 2019-07-12 DIAGNOSIS — D72829 Elevated white blood cell count, unspecified: Secondary | ICD-10-CM | POA: Diagnosis not present

## 2019-07-12 DIAGNOSIS — F17210 Nicotine dependence, cigarettes, uncomplicated: Secondary | ICD-10-CM | POA: Diagnosis present

## 2019-07-12 DIAGNOSIS — I471 Supraventricular tachycardia: Secondary | ICD-10-CM | POA: Diagnosis not present

## 2019-07-12 DIAGNOSIS — I480 Paroxysmal atrial fibrillation: Secondary | ICD-10-CM | POA: Diagnosis present

## 2019-07-12 DIAGNOSIS — R0902 Hypoxemia: Secondary | ICD-10-CM | POA: Diagnosis not present

## 2019-07-12 DIAGNOSIS — Z79899 Other long term (current) drug therapy: Secondary | ICD-10-CM

## 2019-07-12 DIAGNOSIS — R0689 Other abnormalities of breathing: Secondary | ICD-10-CM | POA: Diagnosis not present

## 2019-07-12 DIAGNOSIS — Z90710 Acquired absence of both cervix and uterus: Secondary | ICD-10-CM | POA: Diagnosis not present

## 2019-07-12 DIAGNOSIS — J439 Emphysema, unspecified: Secondary | ICD-10-CM | POA: Diagnosis present

## 2019-07-12 DIAGNOSIS — R509 Fever, unspecified: Secondary | ICD-10-CM | POA: Diagnosis not present

## 2019-07-12 DIAGNOSIS — Z720 Tobacco use: Secondary | ICD-10-CM | POA: Diagnosis present

## 2019-07-12 DIAGNOSIS — I2699 Other pulmonary embolism without acute cor pulmonale: Principal | ICD-10-CM | POA: Diagnosis present

## 2019-07-12 DIAGNOSIS — R410 Disorientation, unspecified: Secondary | ICD-10-CM | POA: Diagnosis not present

## 2019-07-12 DIAGNOSIS — J449 Chronic obstructive pulmonary disease, unspecified: Secondary | ICD-10-CM | POA: Diagnosis present

## 2019-07-12 DIAGNOSIS — J90 Pleural effusion, not elsewhere classified: Secondary | ICD-10-CM | POA: Diagnosis not present

## 2019-07-12 DIAGNOSIS — I10 Essential (primary) hypertension: Secondary | ICD-10-CM | POA: Diagnosis present

## 2019-07-12 DIAGNOSIS — R079 Chest pain, unspecified: Secondary | ICD-10-CM | POA: Diagnosis not present

## 2019-07-12 DIAGNOSIS — R9431 Abnormal electrocardiogram [ECG] [EKG]: Secondary | ICD-10-CM | POA: Diagnosis not present

## 2019-07-12 DIAGNOSIS — Z801 Family history of malignant neoplasm of trachea, bronchus and lung: Secondary | ICD-10-CM | POA: Diagnosis not present

## 2019-07-12 LAB — BASIC METABOLIC PANEL
Anion gap: 10 (ref 5–15)
BUN: 13 mg/dL (ref 8–23)
CO2: 24 mmol/L (ref 22–32)
Calcium: 8.5 mg/dL — ABNORMAL LOW (ref 8.9–10.3)
Chloride: 103 mmol/L (ref 98–111)
Creatinine, Ser: 0.81 mg/dL (ref 0.44–1.00)
GFR calc Af Amer: >60 mL/min (ref >60)
GFR calc non Af Amer: >60 mL/min (ref >60)
Glucose, Bld: 202 mg/dL — ABNORMAL HIGH (ref 70–99)
Potassium: 4 mmol/L (ref 3.5–5.1)
Sodium: 137 mmol/L (ref 135–145)

## 2019-07-12 LAB — CBC
HCT: 42.7 % (ref 36.0–46.0)
Hemoglobin: 14 g/dL (ref 12.0–15.0)
MCH: 30.7 pg (ref 26.0–34.0)
MCHC: 32.8 g/dL (ref 30.0–36.0)
MCV: 93.6 fL (ref 80.0–100.0)
Platelets: 153 10*3/uL (ref 150–400)
RBC: 4.56 MIL/uL (ref 3.87–5.11)
RDW: 13.3 % (ref 11.5–15.5)
WBC: 16.2 10*3/uL — ABNORMAL HIGH (ref 4.0–10.5)
nRBC: 0 % (ref 0.0–0.2)

## 2019-07-12 LAB — TROPONIN I (HIGH SENSITIVITY): Troponin I (High Sensitivity): 7 ng/L (ref <18)

## 2019-07-12 MED ORDER — SODIUM CHLORIDE 0.9% FLUSH
3.0000 mL | Freq: Once | INTRAVENOUS | Status: AC
  Administered 2019-07-13: 3 mL via INTRAVENOUS

## 2019-07-12 NOTE — ED Triage Notes (Addendum)
Pt bib [facility] w/ c/o L sided 10/10 chest pain radiating to L shoulder, arm, and jaw. Pt has hx of V-Tach and cardiac arrest. Pt endorses SOB. Pt received 2 nitro and 324 mg aspirin w/ EMS.

## 2019-07-13 ENCOUNTER — Emergency Department (HOSPITAL_COMMUNITY): Payer: Medicare HMO

## 2019-07-13 ENCOUNTER — Ambulatory Visit: Payer: Medicare HMO

## 2019-07-13 ENCOUNTER — Encounter (HOSPITAL_COMMUNITY): Payer: Self-pay | Admitting: Internal Medicine

## 2019-07-13 DIAGNOSIS — J449 Chronic obstructive pulmonary disease, unspecified: Secondary | ICD-10-CM | POA: Diagnosis present

## 2019-07-13 DIAGNOSIS — I2699 Other pulmonary embolism without acute cor pulmonale: Secondary | ICD-10-CM | POA: Diagnosis present

## 2019-07-13 DIAGNOSIS — Z720 Tobacco use: Secondary | ICD-10-CM | POA: Diagnosis not present

## 2019-07-13 DIAGNOSIS — R509 Fever, unspecified: Secondary | ICD-10-CM | POA: Diagnosis not present

## 2019-07-13 DIAGNOSIS — J90 Pleural effusion, not elsewhere classified: Secondary | ICD-10-CM

## 2019-07-13 DIAGNOSIS — I10 Essential (primary) hypertension: Secondary | ICD-10-CM

## 2019-07-13 DIAGNOSIS — D72829 Elevated white blood cell count, unspecified: Secondary | ICD-10-CM | POA: Diagnosis not present

## 2019-07-13 DIAGNOSIS — J439 Emphysema, unspecified: Secondary | ICD-10-CM | POA: Diagnosis present

## 2019-07-13 LAB — URINALYSIS, ROUTINE W REFLEX MICROSCOPIC
Bilirubin Urine: NEGATIVE
Glucose, UA: NEGATIVE mg/dL
Hgb urine dipstick: NEGATIVE
Ketones, ur: 20 mg/dL — AB
Leukocytes,Ua: NEGATIVE
Nitrite: NEGATIVE
Protein, ur: NEGATIVE mg/dL
Specific Gravity, Urine: >1.046 — ABNORMAL HIGH (ref 1.005–1.030)
pH: 5 (ref 5.0–8.0)

## 2019-07-13 LAB — BASIC METABOLIC PANEL
Anion gap: 12 (ref 5–15)
BUN: 11 mg/dL (ref 8–23)
CO2: 23 mmol/L (ref 22–32)
Calcium: 8.3 mg/dL — ABNORMAL LOW (ref 8.9–10.3)
Chloride: 101 mmol/L (ref 98–111)
Creatinine, Ser: 0.78 mg/dL (ref 0.44–1.00)
GFR calc Af Amer: >60 mL/min (ref >60)
GFR calc non Af Amer: >60 mL/min (ref >60)
Glucose, Bld: 133 mg/dL — ABNORMAL HIGH (ref 70–99)
Potassium: 3.9 mmol/L (ref 3.5–5.1)
Sodium: 136 mmol/L (ref 135–145)

## 2019-07-13 LAB — LACTIC ACID, PLASMA: Lactic Acid, Venous: 0.7 mmol/L (ref 0.5–1.9)

## 2019-07-13 LAB — CBC
HCT: 42.9 % (ref 36.0–46.0)
Hemoglobin: 13.9 g/dL (ref 12.0–15.0)
MCH: 30.5 pg (ref 26.0–34.0)
MCHC: 32.4 g/dL (ref 30.0–36.0)
MCV: 94.1 fL (ref 80.0–100.0)
Platelets: 159 10*3/uL (ref 150–400)
RBC: 4.56 MIL/uL (ref 3.87–5.11)
RDW: 13.6 % (ref 11.5–15.5)
WBC: 16.4 10*3/uL — ABNORMAL HIGH (ref 4.0–10.5)
nRBC: 0 % (ref 0.0–0.2)

## 2019-07-13 LAB — PROTIME-INR
INR: 1.2 (ref 0.8–1.2)
Prothrombin Time: 14.4 seconds (ref 11.4–15.2)

## 2019-07-13 LAB — HIV ANTIBODY (ROUTINE TESTING W REFLEX): HIV Screen 4th Generation wRfx: NONREACTIVE

## 2019-07-13 LAB — TROPONIN I (HIGH SENSITIVITY): Troponin I (High Sensitivity): 9 ng/L (ref <18)

## 2019-07-13 LAB — SARS CORONAVIRUS 2 BY RT PCR (HOSPITAL ORDER, PERFORMED IN ~~LOC~~ HOSPITAL LAB): SARS Coronavirus 2: NEGATIVE

## 2019-07-13 LAB — APTT: aPTT: 37 seconds — ABNORMAL HIGH (ref 24–36)

## 2019-07-13 MED ORDER — AEROCHAMBER PLUS FLO-VU LARGE MISC
1.0000 | Freq: Once | Status: AC
  Administered 2019-07-13: 1

## 2019-07-13 MED ORDER — IOHEXOL 350 MG/ML SOLN
100.0000 mL | Freq: Once | INTRAVENOUS | Status: AC | PRN
  Administered 2019-07-13: 100 mL via INTRAVENOUS

## 2019-07-13 MED ORDER — KETOROLAC TROMETHAMINE 15 MG/ML IJ SOLN
15.0000 mg | Freq: Four times a day (QID) | INTRAMUSCULAR | Status: AC | PRN
  Administered 2019-07-13 – 2019-07-14 (×2): 15 mg via INTRAVENOUS
  Filled 2019-07-13 (×2): qty 1

## 2019-07-13 MED ORDER — APIXABAN 5 MG PO TABS: 5.0000 mg | ORAL_TABLET | Freq: Two times a day (BID) | ORAL | Status: DC

## 2019-07-13 MED ORDER — HEPARIN (PORCINE) 25000 UT/250ML-% IV SOLN
1250.0000 [IU]/h | INTRAVENOUS | Status: DC
  Administered 2019-07-13: 1250 [IU]/h via INTRAVENOUS
  Filled 2019-07-13: qty 250

## 2019-07-13 MED ORDER — ONDANSETRON HCL 4 MG/2ML IJ SOLN: 4.0000 mg | Freq: Three times a day (TID) | INTRAMUSCULAR | Status: DC | PRN

## 2019-07-13 MED ORDER — APIXABAN (ELIQUIS) EDUCATION KIT FOR DVT/PE PATIENTS
PACK | Freq: Once | Status: DC
  Filled 2019-07-13: qty 1

## 2019-07-13 MED ORDER — HEPARIN BOLUS VIA INFUSION
4000.0000 [IU] | Freq: Once | INTRAVENOUS | Status: AC
  Administered 2019-07-13: 4000 [IU] via INTRAVENOUS
  Filled 2019-07-13: qty 4000

## 2019-07-13 MED ORDER — SODIUM CHLORIDE 0.9 % IV SOLN
2.0000 g | INTRAVENOUS | Status: DC
  Administered 2019-07-13: 2 g via INTRAVENOUS
  Filled 2019-07-13: qty 20

## 2019-07-13 MED ORDER — FENTANYL CITRATE (PF) 100 MCG/2ML IJ SOLN
50.0000 ug | Freq: Once | INTRAMUSCULAR | Status: AC
  Administered 2019-07-13: 50 ug via INTRAVENOUS
  Filled 2019-07-13: qty 2

## 2019-07-13 MED ORDER — ALBUTEROL SULFATE HFA 108 (90 BASE) MCG/ACT IN AERS
2.0000 | INHALATION_SPRAY | RESPIRATORY_TRACT | Status: DC | PRN
  Administered 2019-07-13: 2 via RESPIRATORY_TRACT
  Filled 2019-07-13: qty 6.7

## 2019-07-13 MED ORDER — PNEUMOCOCCAL VAC POLYVALENT 25 MCG/0.5ML IJ INJ
0.5000 mL | INJECTION | INTRAMUSCULAR | Status: AC
  Administered 2019-07-15: 0.5 mL via INTRAMUSCULAR
  Filled 2019-07-13: qty 0.5

## 2019-07-13 MED ORDER — OXYCODONE HCL 5 MG PO TABS
5.0000 mg | ORAL_TABLET | ORAL | Status: DC | PRN
  Administered 2019-07-13 – 2019-07-16 (×9): 5 mg via ORAL
  Filled 2019-07-13 (×12): qty 1

## 2019-07-13 MED ORDER — APIXABAN 5 MG PO TABS
10.0000 mg | ORAL_TABLET | Freq: Two times a day (BID) | ORAL | Status: DC
  Administered 2019-07-13 – 2019-07-16 (×8): 10 mg via ORAL
  Filled 2019-07-13 (×9): qty 2

## 2019-07-13 MED ORDER — FENTANYL CITRATE (PF) 100 MCG/2ML IJ SOLN
100.0000 ug | Freq: Once | INTRAMUSCULAR | Status: AC
  Administered 2019-07-13: 100 ug via INTRAVENOUS
  Filled 2019-07-13: qty 2

## 2019-07-13 MED ORDER — SODIUM CHLORIDE 0.9 % IV SOLN
500.0000 mg | INTRAVENOUS | Status: DC
  Administered 2019-07-13: 500 mg via INTRAVENOUS
  Filled 2019-07-13: qty 500

## 2019-07-13 NOTE — Discharge Instructions (Signed)
Information on my medicine - ELIQUIS (apixaban)  Why was Eliquis prescribed for you? Eliquis was prescribed to treat blood clots that may have been found in the veins of your legs (deep vein thrombosis) or in your lungs (pulmonary embolism) and to reduce the risk of them occurring again.  What do You need to know about Eliquis ? The starting dose is 10 mg (two 5 mg tablets) taken TWICE daily for the FIRST SEVEN (7) DAYS, then on 07/20/2019 the dose is reduced to ONE 5 mg tablet taken TWICE daily.  Eliquis may be taken with or without food.   Try to take the dose about the same time in the morning and in the evening. If you have difficulty swallowing the tablet whole please discuss with your pharmacist how to take the medication safely.  Take Eliquis exactly as prescribed and DO NOT stop taking Eliquis without talking to the doctor who prescribed the medication.  Stopping may increase your risk of developing a new blood clot.  Refill your prescription before you run out.  After discharge, you should have regular check-up appointments with your healthcare provider that is prescribing your Eliquis.    What do you do if you miss a dose? If a dose of ELIQUIS is not taken at the scheduled time, take it as soon as possible on the same day and twice-daily administration should be resumed. The dose should not be doubled to make up for a missed dose.  Important Safety Information A possible side effect of Eliquis is bleeding. You should call your healthcare provider right away if you experience any of the following: ? Bleeding from an injury or your nose that does not stop. ? Unusual colored urine (red or dark brown) or unusual colored stools (red or black). ? Unusual bruising for unknown reasons. ? A serious fall or if you hit your head (even if there is no bleeding).  Some medicines may interact with Eliquis and might increase your risk of bleeding or clotting while on Eliquis. To help  avoid this, consult your healthcare provider or pharmacist prior to using any new prescription or non-prescription medications, including herbals, vitamins, non-steroidal anti-inflammatory drugs (NSAIDs) and supplements.  This website has more information on Eliquis (apixaban): http://www.eliquis.com/eliquis/home

## 2019-07-13 NOTE — TOC Benefit Eligibility Note (Signed)
Transition of Care [facility]) Benefit Eligibility Note    Patient Details  Name: Kimberly Walters MRN: [id number] Date of Birth: 05-Dec-1950   Medication/Dose: Eliquis  Covered?: Yes     Prescription Coverage Preferred Pharmacy: [facility] or [employer] with Person/Company/Phone Number:: [employer]  Co-Pay: $47 for 30 day retail/ $141 for 90 day mail order  Prior Approval: No          Delorse Lek Phone Number: 07/13/2019, 3:30 PM

## 2019-07-13 NOTE — ED Provider Notes (Signed)
[facility]ERGENCY DEPARTMENT Provider Note   CSN: [id number] Arrival date & time: 07/12/19  2246     History Chief Complaint  Patient presents with  . Chest Pain    Kimberly Walters is a 69 y.o. female.  Patient with past medical history notable for hypertension, MVP, presents to the emergency department with chief complaint of left-sided chest pain.  She states pain radiates into her shoulder.  She states pain started yesterday.  She reports associated cough.  She denies any fever, but is noted to be mildly febrile in triage with a temperature of 100.8.  She states that she feels exhausted and worn out.  She denies any known sick contacts.  She has had her Covid vaccines.  She is an everyday smoker.  She denies any treatments prior to arrival.  The history is provided by the patient. No language interpreter was used.       Past Medical History:  Diagnosis Date  . Hypertension     Patient Active Problem List   Diagnosis Date Noted  . Generalized postprandial abdominal pain 03/16/2019  . Screening for colon cancer 03/16/2019  . 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 Swelling of lower extremity 11/16/2018  . Hypertension 11/16/2018  . Mitral valve prolapse 11/16/2018  . Tobacco use 11/16/2018  . Bereavement 11/16/2018    Past Surgical History:  Procedure Laterality Date  . HYSTERECTOMY ABDOMINAL WITH SALPINGO-OOPHORECTOMY  1975     OB History   No obstetric history on file.     Family History  Problem Relation Age of Onset  . Diabetes Mother   . Diabetes Father   . CAD Father   . Hypothyroidism Sister   . Melanoma Sister   . Ovarian cancer Other   . Uterine cancer Other   . Breast cancer Other   . Diabetes Son   . Uterine cancer Niece   . Pancreatic cancer Nephew     Social History   Tobacco Use  . Smoking status: Current Every Day Smoker    Packs/day: 0.50    Types: Cigarettes  . Tobacco comment: cutting back    Substance Use Topics  . Alcohol use: No  . Drug use: No    Home Medications Prior to Admission medications   Medication Sig Start Date End Date Taking? Authorizing Provider  acetaminophen (TYLENOL) 325 MG tablet Take 650 mg by mouth every 6 (six) hours as needed.   Yes [provider]  lisinopril (ZESTRIL) 20 MG tablet TAKE 1 TABLET BY MOUTH EVERY DAY Patient taking differently: Take 20 mg by mouth daily.  04/12/19  Yes [place], [name], DO  furosemide (LASIX) 20 MG tablet TAKE 1 TABLET (20 MG TOTAL) BY MOUTH DAILY AS NEEDED (SWELLING IN YOUR LEGS). Patient not taking: Reported on 07/12/2019 04/05/19   Aldine Contes, MD  omeprazole (PRILOSEC) 20 MG capsule TAKE 1 CAPSULE BY MOUTH EVERY DAY Patient not taking: Reported on 07/12/2019 04/12/19   Modena Nunnery D, DO    Allergies    Patient has no known allergies.  Review of Systems   Review of Systems  All other systems reviewed and are negative.   Physical Exam Updated Vital Signs BP 127/60   Pulse 93   Temp (!) 100.8 F (38.2 C) (Oral)   Resp (!) 21   Ht 5\' 4"  (1.626 m)   Wt 72.6 kg   SpO2 99%   BMI 27.46 kg/m   Physical Exam Vitals and nursing note  reviewed.  Constitutional:      General: She is not in acute distress.    Appearance: She is well-developed.  HENT:     Head: Normocephalic and atraumatic.  Eyes:     Conjunctiva/sclera: Conjunctivae normal.  Cardiovascular:     Rate and Rhythm: Normal rate and regular rhythm.     Heart sounds: No murmur heard.   Pulmonary:     Effort: No respiratory distress.     Breath sounds: Normal breath sounds.     Comments: Mildly increased work of breathing Abdominal:     Palpations: Abdomen is soft.     Tenderness: There is no abdominal tenderness.  Musculoskeletal:     Cervical back: Neck supple.  Skin:    General: Skin is warm and dry.  Neurological:     Mental Status: She is alert and oriented to person, place, and time.  Psychiatric:         Mood and Affect: Mood normal.        Behavior: Behavior normal.     ED Results / Procedures / Treatments   Labs (all labs ordered are listed, but only abnormal results are displayed) Labs Reviewed  BASIC METABOLIC PANEL - Abnormal; Notable for the following components:      Result Value   Glucose, Bld 202 (*)    Calcium 8.5 (*)    All other components within normal limits  CBC - Abnormal; Notable for the following components:   WBC 16.2 (*)    All other components within normal limits  APTT - Abnormal; Notable for the following components:   aPTT 37 (*)    All other components within normal limits  SARS CORONAVIRUS 2 BY RT PCR (HOSPITAL ORDER, [place] [facility])  CULTURE, BLOOD (ROUTINE X 2)  CULTURE, BLOOD (ROUTINE X 2)  URINE CULTURE  LACTIC ACID, PLASMA  PROTIME-INR  LACTIC ACID, PLASMA  URINALYSIS, ROUTINE W REFLEX MICROSCOPIC  HEPARIN LEVEL (UNFRACTIONATED)  TROPONIN I (HIGH SENSITIVITY)  TROPONIN I (HIGH SENSITIVITY)    EKG EKG Interpretation  Date/Time:  Monday July 12 2019 23:15:28 EDT Ventricular Rate:  96 PR Interval:  128 QRS Duration: 91 QT Interval:  335 QTC Calculation: 424 R Axis:   71 Text Interpretation: Sinus rhythm Probable anteroseptal infarct, old No significant change was found Confirmed by Ezequiel Essex [phone]) on 07/12/2019 11:45:40 PM   Radiology DG Chest 2 View  Result Date: 07/12/2019 CLINICAL DATA:  Left-sided chest pain. EXAM: CHEST - 2 VIEW COMPARISON:  January 07, 2004 FINDINGS: Mild atelectasis and/or infiltrate is seen within the left lung base. A stable 3 mm calcified nodular opacity is seen overlying the right lung base. There is a small left pleural effusion. No pneumothorax is identified. The heart size and mediastinal contours are within normal limits. The visualized skeletal structures are unremarkable. IMPRESSION: 1. Mild left basilar atelectasis and/or infiltrate. 2. Small left pleural effusion.  Electronically Signed   By: Virgina Norfolk M.D.   On: 07/12/2019 23:55    Procedures .Critical Care Performed by: Montine Circle, PA-C Authorized by: Montine Circle, PA-C   Critical care provider statement:    Critical care time (minutes):  50   Critical care was necessary to treat or prevent imminent or life-threatening deterioration of the following conditions:  Respiratory failure   Critical care was time spent personally by me on the following activities:  Discussions with consultants, evaluation of patient's response to treatment, examination of patient, ordering and performing treatments and  interventions, ordering and review of laboratory studies, ordering and review of radiographic studies, pulse oximetry, re-evaluation of patient's condition, obtaining history from patient or surrogate and review of old charts   (including critical care time)  Medications Ordered in ED Medications  sodium chloride flush (NS) 0.9 % injection 3 mL (has no administration in time range)  cefTRIAXone (ROCEPHIN) 2 g in sodium chloride 0.9 % 100 mL IVPB (has no administration in time range)  azithromycin (ZITHROMAX) 500 mg in sodium chloride 0.9 % 250 mL IVPB (has no administration in time range)  fentaNYL (SUBLIMAZE) injection 50 mcg (has no administration in time range)    ED Course  I have reviewed the triage vital signs and the nursing notes.  Pertinent labs & imaging results that were available during my care of the patient were reviewed by me and considered in my medical decision making (see chart for details).    MDM Rules/Calculators/A&P                          This patient complains of chest pain, shortness of breath this in isvolves an extensive number of treatment options, and is a complaint that carries with it a high risk of complications and morbidity.  The differential diagnosis includes ACS, MI, pneumonia.  Pertinent Labs I ordered, reviewed, and interpreted labs, which  included CBC which shows leukocytosis to 16.2, troponin is 7, BMP shows no significant electrolyte derangement.  Covid test is negative.  Imaging Interpretation I ordered imaging studies which included chest x-ray.  I independently visualized and interpreted the chest x-ray, which showed shows evidence of left sided opacity.  I also ordered a CT PE study.  The CT PE study showed bilateral PEs and left-sided pulmonary infarct.  Will start heparin..   Medications I ordered medication Heparin and fentanyl for pain and PE.  Sources Additional history obtained from daughter. Previous records obtained and reviewed no prior history of PE or DVT.  Consultants Internal medicine residency, appreciate residents for admitting the patient.  Critical Interventions  Heparin infusion.  Reassessments After the interventions stated above, I reevaluated the patient and found still quite uncomfortable.  Will give another dose of fentanyl.  Patient found to have bilateral PEs.  She is not hypotensive.  She is maintaining greater than 90% O2 saturation on 2 L of oxygen.  She does appear uncomfortable, but does not appear unstable.  Discussed results with the patient.  Agreeable with plan for admission to the hospital.  Final Clinical Impression(s) / ED Diagnoses Final diagnoses:  Pulmonary infarct [facility])  Other acute pulmonary embolism, unspecified whether acute cor pulmonale present [facility])    Rx / DC Orders ED Discharge Orders    None       Montine Circle, PA-C 07/13/19 0251    Ezequiel Essex, MD 07/13/19 [phone]

## 2019-07-13 NOTE — Evaluation (Signed)
Physical Therapy Evaluation Patient Details Name: Kimberly Walters MRN: [id number] DOB: 12/28/1950 Today's Date: 07/13/2019   History of Present Illness  [name] is a 69 y/o female with history of HTN, tobacco abuse who presents with acute onset chest pain and shortness of breath that began yesterday morning. Also endorses associated cough and nausea. Prior to symptom onset she noticed left lower extremity pain and swelling that began 4 days prior which has now resolved, as well as generalized fatigue and malaise. In ED pt was febrile and tachy and CT of chest showed B PE's with L sided developing pulmonary infarct.   Clinical Impression  Pt admitted with above diagnosis. Pt presents with 9/10 L chest and shoulder pain, RN notified. At rest on 2.5L O2 O2 sats 90-92% with HR low 100's. Pt with wet cough, occasionally coughing up mucus. Pt required min HHA to steady with pivot to recliner and then back to bed as pt was more uncomfortable in recliner than bed. O2 sats dropped to 85% on RA with HR 124 bpm. Pt with 3/4 DOE. Further ambulation deferred today.  Pt currently with functional limitations due to the deficits listed below (see PT Problem List). Pt will benefit from skilled PT to increase their independence and safety with mobility to allow discharge to the venue listed below.       Follow Up Recommendations No PT follow up    Equipment Recommendations  None recommended by PT    Recommendations for Other Services       Precautions / Restrictions Precautions Precautions: Fall Restrictions Weight Bearing Restrictions: No      Mobility  Bed Mobility Overal bed mobility: Modified Independent             General bed mobility comments: pt able to come to EOB from [facility] elevated with use of rail (pt cannot currently tolerate HOB below 55 deg). Able to lift LE's against gravity independently for return to bed  Transfers Overall transfer level: Needs assistance Equipment used: 1  person hand held assist Transfers: Sit to/from [facility] Sit to Stand: Min assist Stand pivot transfers: Min assist       General transfer comment: min HHA to steady with sit to stand and to pivot to recliner. Pt more uncomfortable in recliner, remained a few minutes and then requested back to bed.   Ambulation/Gait             General Gait Details: deferred today due to DOE, O2 desat, and chest discomfort  Stairs            Wheelchair Mobility    Modified Rankin (Stroke Patients Only)       Balance Overall balance assessment: Mild deficits observed, not formally tested                                           Pertinent Vitals/Pain Pain Assessment: 0-10 Pain Score: 9  Pain Location: R chest and shoulder, LE soreness from falling recently (not rated) Pain Descriptors / Indicators: Cramping;Tightness Pain Intervention(s): Limited activity within patient's tolerance;Monitored during session    Home Living Family/patient expects to be discharged to:: Private residence Living Arrangements: Children Available Help at Discharge: Family;Available PRN/intermittently Type of Home: House Home Access: Stairs to enter Entrance Stairs-Rails: Right Entrance Stairs-Number of Steps: 4 Home Layout: One level Home Equipment: Walker - 2 wheels;Cane - single  point;Shower seat Additional Comments: pt lives with 2 sons and 95 yo granddaughter    Prior Function Level of Independence: Independent         Comments: did not use AD. Was keeping her 2 great grands (2 and 3) regularly     Hand Dominance        Extremity/Trunk Assessment   Upper Extremity Assessment Upper Extremity Assessment: Overall WFL for tasks assessed    Lower Extremity Assessment Lower Extremity Assessment: Overall WFL for tasks assessed    Cervical / Trunk Assessment Cervical / Trunk Assessment: Normal  Communication   Communication: Other (comment) (SOB  with conversation)  Cognition Arousal/Alertness: Awake/alert Behavior During Therapy: WFL for tasks assessed/performed Overall Cognitive Status: Within Functional Limits for tasks assessed                                        General Comments General comments (skin integrity, edema, etc.): on 2.5 L SpO2 90% HR low 100's, on RA SPO2 85% HR up to 124 bpm. Pt with 2/4 dyspnea with conversation at rest, 3/4 DOE.     Exercises General Exercises - Lower Extremity Ankle Circles/Pumps: AROM;Both;20 reps;Supine   Assessment/Plan    PT Assessment Patient needs continued PT services  PT Problem List Cardiopulmonary status limiting activity;Pain;Decreased mobility       PT Treatment Interventions DME instruction;Gait training;Stair training;Functional mobility training;Therapeutic activities;Therapeutic exercise;Patient/family education    PT Goals (Current goals can be found in the Care Plan section)  Acute Rehab PT Goals Patient Stated Goal: less pain PT Goal Formulation: With patient Time For Goal Achievement: 07/27/19 Potential to Achieve Goals: Good Additional Goals Additional Goal #1: Pt to ambulate with SPO2 >90% on RA    Frequency Min 3X/week   Barriers to discharge        Co-evaluation               AM-PAC PT "6 Clicks" Mobility  Outcome Measure Help needed turning from your back to your side while in a flat bed without using bedrails?: None Help needed moving from lying on your back to sitting on the side of a flat bed without using bedrails?: None Help needed moving to and from a bed to a chair (including a wheelchair)?: A Little Help needed standing up from a chair using your arms (e.g., wheelchair or bedside chair)?: A Little Help needed to walk in hospital room?: A Lot Help needed climbing 3-5 steps with a railing? : Total 6 Click Score: 17    End of Session Equipment Utilized During Treatment: Oxygen Activity Tolerance: Patient limited by  pain Patient left: in bed;with call bell/phone within reach;with bed alarm set Nurse Communication: Mobility status;Patient requests pain meds PT Visit Diagnosis: Pain;Difficulty in walking, not elsewhere classified (R26.2) Pain - Right/Left: Left Pain - part of body: Shoulder    Time: 4782-9562 PT Time Calculation (min) (ACUTE ONLY): 24 min   Charges:   PT Evaluation $PT Eval Moderate Complexity: 1 Mod PT Treatments $Therapeutic Activity: 8-22 mins        Leighton Roach, PT  Acute Rehab Services  Pager [phone] Office [place] 07/13/2019, 2:10 PM

## 2019-07-13 NOTE — Progress Notes (Signed)
ANTICOAGULATION CONSULT NOTE - Initial Consult  Pharmacy Consult for Heparin Indication: pulmonary embolus  No Known Allergies  Patient Measurements: Height: 5\' 4"  (162.6 cm) Weight: 72.6 kg (160 lb) IBW/kg (Calculated) : 54.7 Heparin Dosing Weight: 70 kg  Vital Signs: Temp: 100.8 F (38.2 C) (06/21 2249) Temp Source: Oral (06/21 2249) BP: 118/81 (06/22 0115) Pulse Rate: 88 (06/22 0115)  Labs: Recent Labs    07/12/19 2248 07/13/19 0033  HGB 14.0  --   HCT 42.7  --   PLT 153  --   APTT  --  37*  LABPROT  --  14.4  INR  --  1.2  CREATININE 0.81  --   TROPONINIHS 7 9    Estimated Creatinine Clearance: 64.1 mL/min (by C-G formula based on SCr of 0.81 mg/dL).   Medical History: Past Medical History:  Diagnosis Date  . Hypertension     Medications:  No current facility-administered medications on file prior to encounter.   Current Outpatient Medications on File Prior to Encounter  Medication Sig Dispense Refill  . acetaminophen (TYLENOL) 325 MG tablet Take 650 mg by mouth every 6 (six) hours as needed.    [name] lisinopril (ZESTRIL) 20 MG tablet TAKE 1 TABLET BY MOUTH EVERY DAY (Patient taking differently: Take 20 mg by mouth daily. ) 90 tablet 0  . furosemide (LASIX) 20 MG tablet TAKE 1 TABLET (20 MG TOTAL) BY MOUTH DAILY AS NEEDED (SWELLING IN YOUR LEGS). (Patient not taking: Reported on 07/12/2019) 90 tablet 0  . omeprazole (PRILOSEC) 20 MG capsule TAKE 1 CAPSULE BY MOUTH EVERY DAY (Patient not taking: Reported on 07/12/2019) 90 capsule 0     Assessment: 69 y.o. female with PE for heparin Goal of Therapy:  Heparin level 0.3-0.7 units/ml Monitor platelets by anticoagulation protocol: Yes   Plan:  Heparin 4000 units IV bolus, then start heparin 1250 units/hr Check heparin level in 6 hours.   Annaleese Guier, [name] Curb 07/13/2019,2:39 AM

## 2019-07-13 NOTE — H&P (Signed)
Date: 07/13/2019               Patient Name:  Kimberly Walters MRN: [id number]  DOB: 05-28-1950 Age / Sex: 69 y.o., female   PCP: Delice Bison, DO         Medical Service: Internal Medicine Teaching Service         Attending Physician: Dr. Lucious Groves, DO    First Contact: Dr. Darrick Meigs Pager: [phone]  Second Contact: Dr. Truman Hayward Pager: [phone]       After Hours (After 5p/  First Contact Pager: [phone]  weekends / holidays): Second Contact Pager: [phone]   Chief Complaint: chest pain, shortness of breath   History of Present Illness: Ms. Ficek is a 69 y/o female with history of HTN who presents with acute onset chest pain and shortness of breath that began yesterday morning. Also endorses associated cough and nausea. Prior to symptom onset she noticed left lower extremity pain and swelling that began 4 days prior which has now resolved, as well as generalized fatigue and malaise.  When her symptoms had not improved by yesterday evening, her daughter became concerned and called EMS.  Denies any history of blood clots, recent travel, or prolonged immobilization. Her sister has a blood clotting disorder, but is unsure of specifics. She does smoke approximately a half pack per day.  Denies light headedness, headaches, URI symptoms, hemoptysis, palpitations, abdominal pain, urinary symptoms.   On arrival to the ED she was febrile to 100.8 and tachycardic but normotensive and saturating well on room air. Lab work significant for leukocytosis of 16. Other labs including BMP and troponins were normal. CXR showed left-sided pleural effusion with possible infiltrate. A CT angio of the chest was obtained which showed extensive bilateral pulmonary arterial filling defects, as well as evidence of developing left-sided pulmonary infarct.   Meds:  Current Meds  Medication Sig  . acetaminophen (TYLENOL) 325 MG tablet Take 650 mg by mouth every 6 (six) hours as needed.  [name] lisinopril  (ZESTRIL) 20 MG tablet TAKE 1 TABLET BY MOUTH EVERY DAY (Patient taking differently: Take 20 mg by mouth daily. )     Allergies: Allergies as of 07/12/2019  . (No Known Allergies)   Past Medical History:  Diagnosis Date  . Hypertension     Family History: Mom died from congestive heart failure; father had diabetes and heart disease; all of her sisters are diabetic   Social History: lives at home with her son; smokes 1/2 ppd; no EtOH or illicit drug use  Review of Systems: A complete ROS was negative except as per HPI.   Physical Exam: Blood pressure 134/70, pulse 95, temperature (!) 100.8 F (38.2 C), temperature source Oral, resp. rate 18, height 5\' 4"  (1.626 m), weight 72.6 kg, SpO2 94 %. General: awake, alert, appears uncomfortable 2/2 pain, non-toxic  HEENT: [place]/AT; moist mucous membranes; conjunctiva normal  Neck: supple; no JVD CV: RRR; no m/r/g Pulm: normal work of breathing; shallow breaths due to pain; scattered expiratory wheezes  Abd: BS+; abdomen soft, non-tender, non-distended Neuro: A&Ox3; no focal deficits Ext: LLE without calf pain or swelling; negative Homan's sign.   EKG: personally reviewed my interpretation is NSR. No acute ischemic changes   CXR: personally reviewed my interpretation is small left sided pleural effusion, otherwise clear.   Assessment & Plan by Problem: Principal Problem:   Acute pulmonary embolus ([place])  In summary, Ms. Blass is a 69 y/o female with history HTN  who presents with acute left-sided chest pain and shortness of breath. Work-up consistent with bilateral segmental PE with evidence of left-sided pulmonary infarct and effusion. She was admitted for further management.   Acute bilateral segmental PE -patient is HDS, does not have evidence of right heart strain on imaging and has normal troponins. No indication for IR intervention or systemic thrombolytics -she was initiated on heparin drip in the ED which can be transitioned to  PO DOAC  -risk factors for DVT/PE include smoking and family history; will need to ensure she's UTD on age appropriate cancer screenings  -given that this seems to have been an unprovoked event, will need at least 6 months of anticoagulation with preference for lifelong therapy  -continue supportive care with oxygen supplementation as needed and analgesics   HTN -on Lisinopril as an outpatient -will hold while inpatient   DVT ppx: on full dose anticoagulation Diet: Regular CODE: FULL   Dispo: Admit patient to Observation with expected length of stay less than 2 midnights.  SignedDelice Bison, DO 07/13/2019, 3:38 AM  Pager: [phone] After 5pm on weekdays and 1pm on weekends: On Call pager: [phone]

## 2019-07-13 NOTE — Progress Notes (Signed)
Transitions of Care Pharmacist Note  Kimberly Walters is a 69 y.o. female that has been diagnosed with PE and will be prescribed Eliquis (apixaban) at discharge.   Patient Education: I provided the following education on Eliquis (apixaban) to the patient: How to take the medication Described what the medication is Signs of bleeding Signs/symptoms of VTE and stroke  Answered their questions  Discharge Medications Plan: The patient wants to have their discharge medications filled by the Transitions of Care pharmacy rather than their usual pharmacy.  The discharge orders pharmacy has been changed to the Transitions of Care pharmacy, the patient will receive a phone call regarding co-pay, and their medications will be delivered by the Transitions of Care pharmacy.    Thank you,   Cristela Felt, PharmD PGY1 Pharmacy Resident  July 13, 2019

## 2019-07-13 NOTE — Progress Notes (Signed)
NAME:  Kimberly Walters, MRN:  [id number], DOB:  [birth year], LOS: 0 ADMISSION DATE:  07/12/2019   Brief History  69 yo female with hypertension who presented [facility] on [facility] for acute onset chest pain and shortness of breath and found to have bilateral segmental Pes. She was noted to have LLE pain/swelling 4d prior.  She denied any risk factors however did note a sister with a clotting disorder. Smokes 0.5packs per day.  Subjective  No overnight events She was examined and evaluated at bedside this am. She mentions that she is continuing to endorse chest pain, exacerbated by breathing but severity has improved since admission. Family member at bedside mentions having other family members with lupus. Mentions tripping on the steps and injuring her leg about a month ago and states the same leg was swollen for a period of time.  Significant Hospital Events   6/22 admission  Objective   Blood pressure 106/65, pulse 99, temperature (!) 100.8 F (38.2 C), temperature source Oral, resp. rate (!) 27, height 5\' 4"  (1.626 m), weight 72.6 kg, SpO2 93 %.     Intake/Output Summary (Last 24 hours) at 07/13/2019 0541 Last data filed at 07/13/2019 0256 Gross per 24 hour  Intake 350 ml  Output --  Net 350 ml   Filed Weights   07/12/19 2349  Weight: 72.6 kg    Examination: GENERAL: in no acute distress CARDIAC: tachycardic rate. Regular rhythm. No peripheral edema.  PULMONARY: mild respiratory distress with gross expiratory wheezing. Poor air movement with expiratory wheezes appreciated on auscultation as well  Consults:  none  Significant Diagnostic Tests:  6/21 CXR>>mild left basilar aelectasis and/or infiltrate. Small left pleural effusion 6/22  CTA chest>>extensive bilateral pulm arterial filling defects extending from bilateral lower lobe into the segmental and subsegmental branches. Mild central pulm artery enlargement but with a maintained rV/LV ratio that could suggest right heart strain.  Mixed ground glass consolidative opacity in the left lung base concerning for pulm infarct. Additional ground glass in the posterior basal segment of the RLL could reflect infarct vs atelectasis. Emphysema  Micro Data:  n/a  Antimicrobials:  n/a  Labs    CBC Latest Ref Rng & Units 07/13/2019 07/12/2019  WBC 4.0 - 10.5 K/uL 16.4(H) 16.2(H)  Hemoglobin 12.0 - 15.0 g/dL 13.9 14.0  Hematocrit 36 - 46 % 42.9 42.7  Platelets 150 - 400 K/uL 159 153   BMP Latest Ref Rng & Units 07/13/2019 07/12/2019 12/21/2018  Glucose 70 - 99 mg/dL 133(H) 202(H) 113(H)  BUN 8 - 23 mg/dL 11 13 15   Creatinine 0.44 - 1.00 mg/dL 0.78 0.81 0.78  BUN/Creat Ratio 12 - 28 - - -  Sodium 135 - 145 mmol/L 136 137 140  Potassium 3.5 - 5.1 mmol/L 3.9 4.0 4.1  Chloride 98 - 111 mmol/L 101 103 104  CO2 22 - 32 mmol/L 23 24 28   Calcium 8.9 - 10.3 mg/dL 8.3(L) 8.5(L) 9.2    Summary  69 yo female with hypertension who was admitted to IMTS on 07/13/19 for acute bilateral segmental and subsegmental PE's that is presumably unprovoked. She is hemodynamically stable and was placed on apixaban. She is still somewhat short of breath this morning and remains on minimal supplemental oxygen. We are obtaining a PT consultation to assist with safe disposition.  Assessment & Plan:  Principal Problem:   Acute pulmonary embolus (HCC)  Acute bilateral segemental and subsegmental PE. Presumedly unprovoked at this time. FH notable for lupus in a  sister. Remains hemodynamically stable. Normal LV/RV ratio on CTA to suggest right heart strain. Fever/leukocytosis likely 2/2 PE however would consider infectious etiology if symptoms worsen or do not improve over the next few days. Plan Continue eliquis Will need age appropriate cancer screening May benefit from lupus workup if anticoagulation is stopped after 26mo  PT eval  Emphysematous pulmonary changes. As noted on 6/22 CTA. Significant pack year history. Currently smokes about 0.5  packs/day. Discussed importance of smoking cessation to which she noted she stopped about 3d ago. Offered nicotine patch to which she refused. Discussed obtaining PFTs outpatient for further evaluation.  Hypertension. Home antihypertensives held on admission. Blood pressures normotensive at this time. Will resume if needed later on.  Best practice:  CODE STATUS: full Diet: cardiac DVT for prophylaxis: eliquis Social considerations/Family communication: daughter updated at bedside Dispo: 0-1d   Mitzi Hansen, [place] PGY-1 Contact info:  Please page me at [phone] from 7am-5pm M-F.  Please use the IMTS after hours pager at [phone] after 5pm M-F and on weekends 07/13/19  5:41 AM

## 2019-07-13 NOTE — ED Notes (Signed)
Report given to inpatient RN.

## 2019-07-13 NOTE — ED Notes (Signed)
Lunch Tray Ordered @ 1048. °

## 2019-07-14 DIAGNOSIS — J439 Emphysema, unspecified: Secondary | ICD-10-CM | POA: Diagnosis present

## 2019-07-14 DIAGNOSIS — J9601 Acute respiratory failure with hypoxia: Secondary | ICD-10-CM | POA: Diagnosis present

## 2019-07-14 DIAGNOSIS — I480 Paroxysmal atrial fibrillation: Secondary | ICD-10-CM | POA: Diagnosis present

## 2019-07-14 DIAGNOSIS — I2699 Other pulmonary embolism without acute cor pulmonale: Secondary | ICD-10-CM | POA: Diagnosis present

## 2019-07-14 DIAGNOSIS — F17210 Nicotine dependence, cigarettes, uncomplicated: Secondary | ICD-10-CM | POA: Diagnosis present

## 2019-07-14 DIAGNOSIS — Z90710 Acquired absence of both cervix and uterus: Secondary | ICD-10-CM | POA: Diagnosis not present

## 2019-07-14 DIAGNOSIS — Z20822 Contact with and (suspected) exposure to covid-19: Secondary | ICD-10-CM | POA: Diagnosis present

## 2019-07-14 DIAGNOSIS — I10 Essential (primary) hypertension: Secondary | ICD-10-CM | POA: Diagnosis present

## 2019-07-14 DIAGNOSIS — Z801 Family history of malignant neoplasm of trachea, bronchus and lung: Secondary | ICD-10-CM | POA: Diagnosis not present

## 2019-07-14 DIAGNOSIS — R509 Fever, unspecified: Secondary | ICD-10-CM | POA: Diagnosis not present

## 2019-07-14 DIAGNOSIS — Z79899 Other long term (current) drug therapy: Secondary | ICD-10-CM | POA: Diagnosis not present

## 2019-07-14 DIAGNOSIS — Z23 Encounter for immunization: Secondary | ICD-10-CM | POA: Diagnosis present

## 2019-07-14 DIAGNOSIS — Z720 Tobacco use: Secondary | ICD-10-CM | POA: Diagnosis not present

## 2019-07-14 DIAGNOSIS — R9431 Abnormal electrocardiogram [ECG] [EKG]: Secondary | ICD-10-CM | POA: Diagnosis not present

## 2019-07-14 DIAGNOSIS — I471 Supraventricular tachycardia: Secondary | ICD-10-CM | POA: Diagnosis not present

## 2019-07-14 DIAGNOSIS — R042 Hemoptysis: Secondary | ICD-10-CM | POA: Diagnosis not present

## 2019-07-14 LAB — CBC
HCT: 40 % (ref 36.0–46.0)
Hemoglobin: 12.9 g/dL (ref 12.0–15.0)
MCH: 30.5 pg (ref 26.0–34.0)
MCHC: 32.3 g/dL (ref 30.0–36.0)
MCV: 94.6 fL (ref 80.0–100.0)
Platelets: 158 10*3/uL (ref 150–400)
RBC: 4.23 MIL/uL (ref 3.87–5.11)
RDW: 13.6 % (ref 11.5–15.5)
WBC: 16.1 10*3/uL — ABNORMAL HIGH (ref 4.0–10.5)
nRBC: 0 % (ref 0.0–0.2)

## 2019-07-14 LAB — BASIC METABOLIC PANEL
Anion gap: 13 (ref 5–15)
BUN: 14 mg/dL (ref 8–23)
CO2: 22 mmol/L (ref 22–32)
Calcium: 8.2 mg/dL — ABNORMAL LOW (ref 8.9–10.3)
Chloride: 101 mmol/L (ref 98–111)
Creatinine, Ser: 0.93 mg/dL (ref 0.44–1.00)
GFR calc Af Amer: >60 mL/min (ref >60)
GFR calc non Af Amer: >60 mL/min (ref >60)
Glucose, Bld: 169 mg/dL — ABNORMAL HIGH (ref 70–99)
Potassium: 3.8 mmol/L (ref 3.5–5.1)
Sodium: 136 mmol/L (ref 135–145)

## 2019-07-14 LAB — URINE CULTURE

## 2019-07-14 LAB — TSH: TSH: 0.547 u[IU]/mL (ref 0.350–4.500)

## 2019-07-14 MED ORDER — METOPROLOL TARTRATE 5 MG/5ML IV SOLN
5.0000 mg | Freq: Once | INTRAVENOUS | Status: AC
  Administered 2019-07-14: 5 mg via INTRAVENOUS
  Filled 2019-07-14: qty 5

## 2019-07-14 MED ORDER — METOPROLOL TARTRATE 5 MG/5ML IV SOLN
INTRAVENOUS | Status: AC
  Filled 2019-07-14: qty 5

## 2019-07-14 MED ORDER — METOPROLOL TARTRATE 25 MG PO TABS
25.0000 mg | ORAL_TABLET | Freq: Two times a day (BID) | ORAL | Status: DC
  Administered 2019-07-14 – 2019-07-15 (×3): 25 mg via ORAL
  Filled 2019-07-14 (×3): qty 1

## 2019-07-14 MED ORDER — METOPROLOL TARTRATE 5 MG/5ML IV SOLN
5.0000 mg | Freq: Once | INTRAVENOUS | Status: AC
  Administered 2019-07-14: 5 mg via INTRAVENOUS

## 2019-07-14 NOTE — Progress Notes (Signed)
NAME:  Kimberly Walters, MRN:  [id number], DOB:  1950-11-07, LOS: 1 ADMISSION DATE:  07/12/2019   Brief History  69 yo female with hypertension who presented to [facility] on 6/21 for acute onset chest pain and shortness of breath and found to have bilateral segmental Pes. She was noted to have LLE pain/swelling 4d prior.  She denied any risk factors however did note a sister with a clotting disorder. Smokes 0.5packs per day.  Subjective/Interm history  Unable to discharge yesterday due to desaturations with ambulation requiring 2.5L supplemental O2  Paged around 0930 this morning for tele reporting SVT. Pt evaluated at bedside. She notes that she does feel some chest discomfort on the left, otherwise no other symtpoms. HR on the monitor showing 140s-150s. 5mg  lopressor given which worked briefly and EKG was obtained showing atrial fibrillation. HR increased back into the 140s but responded well to another 5mg  lopressor and has remained <110 since then. Pt re-evaluated and she is noting that the chest discomfort is resolved and she is feeling better.  Significant Hospital Events   6/22 admission 6/23 on 3L supplemental O2. Went into afib with RVR>improved with lopressor x2. Continuing to monitor. Starting 25mg  metoprolol po  Objective   Blood pressure 120/62, pulse 96, temperature 98.1 F (36.7 C), resp. rate 20, height 5\' 4"  (1.626 m), weight 72.6 kg, SpO2 95 %.     Intake/Output Summary (Last 24 hours) at 07/14/2019 0518 Last data filed at 07/13/2019 2100 Gross per 24 hour  Intake 240 ml  Output --  Net 240 ml   Filed Weights   07/12/19 2349  Weight: 72.6 kg    Examination: GENERAL: in no acute distress; no diaphoresis CARDIAC: tachycardic rate. Irregular rhythm. No peripheral edema.  PULMONARY: lungs clear. Breathing comfortably on 3L supplemental O2  Consults:  none  Significant Diagnostic Tests:  6/21 CXR>>mild left basilar aelectasis and/or infiltrate. Small left pleural  effusion 6/22  CTA chest>>extensive bilateral pulm arterial filling defects extending from bilateral lower lobe into the segmental and subsegmental branches. Mild central pulm artery enlargement but with a maintained rV/LV ratio that could suggest right heart strain. Mixed ground glass consolidative opacity in the left lung base concerning for pulm infarct. Additional ground glass in the posterior basal segment of the RLL could reflect infarct vs atelectasis. Emphysema  Micro Data:  n/a  Antimicrobials:  n/a  Labs    CBC Latest Ref Rng & Units 07/14/2019 07/13/2019 07/12/2019  WBC 4.0 - 10.5 K/uL 16.1(H) 16.4(H) 16.2(H)  Hemoglobin 12.0 - 15.0 g/dL 12.9 13.9 14.0  Hematocrit 36 - 46 % 40.0 42.9 42.7  Platelets 150 - 400 K/uL 158 159 153   BMP Latest Ref Rng & Units 07/13/2019 07/12/2019 12/21/2018  Glucose 70 - 99 mg/dL 133(H) 202(H) 113(H)  BUN 8 - 23 mg/dL 11 13 15   Creatinine 0.44 - 1.00 mg/dL 0.78 0.81 0.78  BUN/Creat Ratio 12 - 28 - - -  Sodium 135 - 145 mmol/L 136 137 140  Potassium 3.5 - 5.1 mmol/L 3.9 4.0 4.1  Chloride 98 - 111 mmol/L 101 103 104  CO2 22 - 32 mmol/L 23 24 28   Calcium 8.9 - 10.3 mg/dL 8.3(L) 8.5(L) 9.2    Summary  69 yo female with hypertension who was admitted to IMTS on 07/13/19 for acute bilateral segmental and subsegmental PE's that is presumably unprovoked and was placed on apixaban. She has continued to require 3L of supplemental oxygen. She developed afib with RVR on 6/23 which  responded well to lopressor. We have started her on po metoprolol to have longer lasting rate control. Her HR, on last assessment, was being maintained in the 90s.  Assessment & Plan:  Principal Problem:   Acute pulmonary embolus (HCC) Active Problems:   Hypertension   Tobacco use   Emphysema, unspecified ([place])   Acute pulmonary embolism (HCC)  Acute hypoxic respiratory failure due to -Acute unprovoked bilateral segemental and subsegmental PE.  FH notable for lupus in a  sister. Remains hemodynamically stable--tachycardia improving. Normal LV/RV ratio on CTA so unlikely to have right heart strain. Fever resolved--likely 2/2 PE Leukocytosis likely reactive Plan Continue eliquis. Will discharge with supplemental oxygen and instruct her on close outpatient follow up. Will need age appropriate cancer screening at discharge  Emphysematous pulmonary changes. As noted on 6/22 CTA. Likely contributing to respiratory symptoms Tobacco use disorder. Patient noting recent cessation 3d prior to admission. Not interested in nicotine patch at this time. Continue to encourage continued cessation at discharge.  Acute onset atrial fibrillation--likely precipitated by PE. See subjective.  No electrolyte derangements on BMP and TSH wnl.  EKG without STE/TWI regional patterns. Plan: started on 25mg  metoprolol. Already on eliquis for the PE. Continue tele monitoring.   Hypertension. Continue holding home antihypertensives  Best practice:  CODE STATUS: full Diet: cardiac DVT for prophylaxis: eliquis Social considerations/Family communication: updated at bedside Dispo: [place], [place] PGY-1 Contact info:  Please page me at [phone] from 7am-5pm M-F.  Please use the IMTS after hours pager at [phone] after 5pm M-F and on weekends 07/14/19  5:18 AM

## 2019-07-14 NOTE — Progress Notes (Signed)
Chaplain responded to Spiritual Consult request for an AD.  Chaplain introduced self to patient and her son and daughter.  Chaplain inquired if patient was interested in an AD.  She was, so Chaplain explained AD.  Patient discussed each section with her children.  This brought up sad memories of when their dad had died as well as memories around other family members' deaths.  Both children shed tears thinking about this being an eventuality with their mom.  She and they comforted one another.  Son had to leave but daughter stayed. She and I held her mother's hands as Chaplain prayed with mother, offering up her petition asking  the Lord to protect her children, all of them including her grandchildren and great-grands.  Chaplain invited patient and daughter to have nurse contact [place] when document has been completed for notary.  Chaplain will return when called.  De Burrs Chaplain Resident.

## 2019-07-14 NOTE — Assessment & Plan Note (Signed)
Discovered during hospitalization for bilateral Pulmonary embolism.

## 2019-07-14 NOTE — Progress Notes (Signed)
Physical Therapy Treatment Patient Details Name: Kimberly Walters MRN: [id number] DOB: 07-24-1950 Today's Date: 07/14/2019    History of Present Illness [name] is a 69 y/o female with history of HTN, tobacco abuse who presents with acute onset chest pain and shortness of breath that began yesterday morning. Also endorses associated cough and nausea. Prior to symptom onset she noticed left lower extremity pain and swelling that began 4 days prior which has now resolved, as well as generalized fatigue and malaise. In ED pt was febrile and tachy and CT of chest showed B PE's with L sided developing pulmonary infarct.     PT Comments    Pt with mild resting dyspnea and chest wall discomfort 2* PE upon PT arrival to room, but agreeable to mobility as pt determined to go home ASAP. Pt required close guard for safety this day for all mobility, and tolerated 25 ft ambulation in room before needing rest for significant dyspnea and tachycardia up to 127 bpm. Pt's SpO2 was 88% and greater on 2LO2 during gait, but immediately post-gait in recliner sats dropped to 78%, requiring 4LO2, breathing technique, and 2-minute seated rest to recover to 90%. PT briefly trialled RA prior to mobility, with SpO2 reaching 85% so not appropriate to trial room air with pt during mobility this day. PT updated recommendation to reflect HHPT, as pt is deconditioned and struggles with mobility at this time. At baseline, pt is very independent and would like to return to this.  SATURATION QUALIFICATIONS: (This note is used to comply with regulatory documentation for home oxygen)  Patient Saturations on Room Air at Rest = 85%  Patient Saturations on Room Air while Ambulating = -- %  Patient Saturations on 4 Liters of oxygen while Ambulating = 90%  Please briefly explain why patient needs home oxygen: to maintain O2 >88% during mobility    Follow Up Recommendations  Home health PT;Supervision for mobility/OOB     Equipment  Recommendations  None recommended by PT    Recommendations for Other Services       Precautions / Restrictions Precautions Precautions: Fall Restrictions Weight Bearing Restrictions: No    Mobility  Bed Mobility Overal bed mobility: Needs Assistance Bed Mobility: Supine to Sit     Supine to sit: HOB elevated;Supervision     General bed mobility comments: for safety, increased time and effort with use of HOB elevation to 45* and bedrails.  Transfers Overall transfer level: Needs assistance Equipment used: 1 person hand held assist Transfers: Sit to/from Stand Sit to Stand: Min assist         General transfer comment: Min assist for initial power up and steadying upon standing, slow to rise and steady.  Ambulation/Gait Ambulation/Gait assistance: Min guard Gait Distance (Feet): 25 Feet Assistive device: None Gait Pattern/deviations: Step-through pattern;Decreased stride length;Trunk flexed;Wide base of support Gait velocity: decr   General Gait Details: Close guard for safety, verbal cuing for upright posture. Slow, mildly unsteady gait with DOE 3/4. SpO2 88% and greater on 2LO2 during gait, but immediately post-gait in recliner sats dropped to 78%, requiring 4LO2 to recover to 90% with breathing technique and 2 minutes rest.   Stairs             Wheelchair Mobility    Modified Rankin (Stroke Patients Only)       Balance Overall balance assessment: Mild deficits observed, not formally tested  Cognition Arousal/Alertness: Awake/alert Behavior During Therapy: WFL for tasks assessed/performed Overall Cognitive Status: Within Functional Limits for tasks assessed                                        Exercises Other Exercises Other Exercises: Breathing technique: in through nose, out through mouth (pt requires max verbal cuing for breathing technique during mobility and  recovery)    General Comments General comments (skin integrity, edema, etc.): HR 113-127 bpm during session, SpO2 minimum 78% on 2LO2 after short distance ambulation requiring 4LO2 to recover sats >88%. DOE 3/4 with mobility      Pertinent Vitals/Pain Pain Assessment: Faces Faces Pain Scale: Hurts even more Pain Location: R chest wall Pain Descriptors / Indicators: Cramping;Tightness Pain Intervention(s): Monitored during session;Limited activity within patient's tolerance;Repositioned    Home Living                      Prior Function            PT Goals (current goals can now be found in the care plan section) Acute Rehab PT Goals Patient Stated Goal: less pain PT Goal Formulation: With patient Time For Goal Achievement: 07/27/19 Potential to Achieve Goals: Good Progress towards PT goals: Progressing toward goals    Frequency    Min 3X/week      PT Plan Discharge plan needs to be updated    Co-evaluation              AM-PAC PT "6 Clicks" Mobility   Outcome Measure  Help needed turning from your back to your side while in a flat bed without using bedrails?: A Little Help needed moving from lying on your back to sitting on the side of a flat bed without using bedrails?: A Little Help needed moving to and from a bed to a chair (including a wheelchair)?: A Little Help needed standing up from a chair using your arms (e.g., wheelchair or bedside chair)?: A Little Help needed to walk in hospital room?: A Little Help needed climbing 3-5 steps with a railing? : A Lot 6 Click Score: 17    End of Session Equipment Utilized During Treatment: Oxygen Activity Tolerance: Patient limited by pain;Patient limited by fatigue;Other (comment) (dyspnea) Patient left: in bed;with call bell/phone within reach;with bed alarm set Nurse Communication: Mobility status PT Visit Diagnosis: Pain;Difficulty in walking, not elsewhere classified (R26.2) Pain - Right/Left:  Left Pain - part of body: Shoulder     Time: 3893-7342 PT Time Calculation (min) (ACUTE ONLY): 20 min  Charges:  $Gait Training: 8-22 mins                     Bretta Fees E, PT [place] Pager [phone]  Office [phone]    Ande Therrell D Elonda Husky 07/14/2019, 10:38 AM

## 2019-07-14 NOTE — TOC Initial Note (Signed)
Transition of Care [facility]) - Initial/Assessment Note    Patient Details  Name: Kimberly Walters MRN: [id number] Date of Birth: 08-11-1950  Transition of Care [facility]) CM/SW Contact:    Emeterio Reeve, [place] Phone Number: 07/14/2019, 4:46 PM  Clinical Narrative:                  CSW met with pt at bedside. CSW introduced self and explained her role at the hospital. Pts daughter was present for conversation. PT stated PTA she was independent and taking care of all ADL's. Pt lives at home with her two sons and granddaughter. Pt was not using any DME  CSW reviewed PT reccs of HHPT. Pt is agreeable to PT. Pt states she has not used HH in the past. PT stated she wanted to use [facility], csw informed [facility] that [facility] not have a HHPT nor a contract with other HHPT. PT states she has no other preference.   CSW reached out to [place] at [place] [facility]. Tiffany stated she will look at availability and return the phone call.   CSW will continue to follow.   Expected Discharge Plan: [place] Barriers to Discharge: Continued Medical Work up   Patient Goals and CMS Choice Patient states their goals for this hospitalization and ongoing recovery are:: "To return home"      Expected Discharge Plan and Services Expected Discharge Plan: [place]       Living arrangements for the past 2 months: Single Family Home                                      Prior Living Arrangements/Services Living arrangements for the past 2 months: Single Family Home Lives with:: Adult Children Patient language and need for interpreter reviewed:: Yes Do you feel safe going back to the place where you live?: Yes      Need for Family Participation in Patient Care: Yes (Comment) Care giver support system in place?: Yes (comment)   Criminal Activity/Legal Involvement Pertinent to Current Situation/Hospitalization: No - Comment as needed  Activities of Daily Living Home  Assistive Devices/Equipment: None ADL Screening (condition at time of admission) Patient's cognitive ability adequate to safely complete daily activities?: Yes Is the patient deaf or have difficulty hearing?: No Does the patient have difficulty seeing, even when wearing glasses/contacts?: No Does the patient have difficulty concentrating, remembering, or making decisions?: No Patient able to express need for assistance with ADLs?: Yes Does the patient have difficulty dressing or bathing?: No Independently performs ADLs?: Yes (appropriate for developmental age) Communication: Independent Dressing (OT): Independent Does the patient have difficulty walking or climbing stairs?: No Weakness of Legs: None Weakness of Arms/Hands: None  Permission Sought/Granted   Permission granted to share information with : Yes, Verbal Permission Granted     Permission granted to share info w AGENCY: HH agencies        Emotional Assessment Appearance:: Appears stated age Attitude/Demeanor/Rapport: Engaged Affect (typically observed): Appropriate Orientation: : Oriented to Self, Oriented to Place, Oriented to  Time, Oriented to Situation Alcohol / Substance Use: Not Applicable Psych Involvement: No (comment)  Admission diagnosis:  Pulmonary infarct ([place]) [I26.99] Acute pulmonary embolus ([place]) [I26.99] Other acute pulmonary embolism, unspecified whether acute cor pulmonale present ([place]) [I26.99] Acute pulmonary embolism ([place]) [I26.99] Patient Active Problem List   Diagnosis Date Noted  .  Paroxysmal atrial fibrillation with RVR ([place])   . Acute pulmonary embolus ([place]) 07/13/2019  . Emphysema, unspecified ([place]) 07/13/2019  . Acute pulmonary embolism ([place]) 07/13/2019  . Generalized postprandial abdominal pain 03/16/2019  . Screening for colon cancer 03/16/2019  . 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 Swelling of lower extremity 11/16/2018  . Hypertension 11/16/2018  . Mitral valve prolapse  11/16/2018  . Tobacco use 11/16/2018  . Bereavement 11/16/2018   PCP:  Delice Bison, DO Pharmacy:   [id number] [address] [place]Phone: [phone]: [phone] MZacarias PontesTransitions of CEl [place] [address]Phone: [phone]: [phone]    Social Determinants of Health (SDOH) Interventions    Readmission Risk Interventions No flowsheet data found.  MEmeterio Reeve LLatanya Presser LKaanapaliSocial Worker [phone]

## 2019-07-15 ENCOUNTER — Inpatient Hospital Stay (HOSPITAL_COMMUNITY): Payer: Medicare HMO

## 2019-07-15 ENCOUNTER — Encounter (HOSPITAL_COMMUNITY): Payer: Self-pay | Admitting: Internal Medicine

## 2019-07-15 DIAGNOSIS — R042 Hemoptysis: Secondary | ICD-10-CM

## 2019-07-15 DIAGNOSIS — R9431 Abnormal electrocardiogram [ECG] [EKG]: Secondary | ICD-10-CM

## 2019-07-15 LAB — ECHOCARDIOGRAM COMPLETE
Height: 64 in
Weight: 2560 oz

## 2019-07-15 LAB — CBC
HCT: 39 % (ref 36.0–46.0)
Hemoglobin: 12.6 g/dL (ref 12.0–15.0)
MCH: 30.2 pg (ref 26.0–34.0)
MCHC: 32.3 g/dL (ref 30.0–36.0)
MCV: 93.5 fL (ref 80.0–100.0)
Platelets: 176 10*3/uL (ref 150–400)
RBC: 4.17 MIL/uL (ref 3.87–5.11)
RDW: 13.5 % (ref 11.5–15.5)
WBC: 14.4 10*3/uL — ABNORMAL HIGH (ref 4.0–10.5)
nRBC: 0 % (ref 0.0–0.2)

## 2019-07-15 MED ORDER — METOPROLOL TARTRATE 50 MG PO TABS
50.0000 mg | ORAL_TABLET | Freq: Two times a day (BID) | ORAL | Status: DC
  Administered 2019-07-15 – 2019-07-16 (×2): 50 mg via ORAL
  Filled 2019-07-15 (×2): qty 1

## 2019-07-15 MED ORDER — METOPROLOL TARTRATE 25 MG PO TABS
25.0000 mg | ORAL_TABLET | Freq: Once | ORAL | Status: AC
  Administered 2019-07-15: 25 mg via ORAL
  Filled 2019-07-15: qty 1

## 2019-07-15 NOTE — Progress Notes (Signed)
Physical Therapy Treatment Patient Details Name: Kimberly Walters MRN: [id number] DOB: 1950/03/11 Today's Date: 07/15/2019    History of Present Illness Ms. Walstad is a 69 y/o female with history of HTN, tobacco abuse who presents with acute onset chest pain and shortness of breath that began yesterday morning. Also endorses associated cough and nausea. Prior to symptom onset she noticed left lower extremity pain and swelling that began 4 days prior which has now resolved, as well as generalized fatigue and malaise. In ED pt was febrile and tachy and CT of chest showed B PE's with L sided developing pulmonary infarct.     PT Comments    Pt with much improved activity tolerance this day, although dyspnea on exertion still present requiring cuing from PT for breathing technique and standing rest breaks. Pt required 3LO2 during ambulation to maintain SpO2 88% and greater, RN notified. Multiple productive coughs noted during mobility, which pt states makes her breathing feel better. PT to continue to follow acutely.   SATURATION QUALIFICATIONS: (This note is used to comply with regulatory documentation for home oxygen)  Patient Saturations on Room Air at Rest = unable to assess  Patient Saturations on Room Air while Ambulating = unable to assess   Patient Saturations on 3 Liters of oxygen while Ambulating = 88%  Please briefly explain why patient needs home oxygen: unable to assess RA trials as pt satting at 85% on 2LO2 during gait, requires supplemental O2 to maintain O2 saturation at appropriate level   Follow Up Recommendations  Home health PT;Supervision for mobility/OOB     Equipment Recommendations  None recommended by PT    Recommendations for Other Services       Precautions / Restrictions Precautions Precautions: Fall Restrictions Weight Bearing Restrictions: No    Mobility  Bed Mobility Overal bed mobility: Needs Assistance Bed Mobility: Supine to Sit;Sit to Supine      Supine to sit: HOB elevated;Supervision Sit to supine: Supervision;HOB elevated   General bed mobility comments: for safety, increased time and effort with use of HOB elevation to 45* and bedrails  Transfers Overall transfer level: Needs assistance Equipment used: None Transfers: Sit to/from Stand Sit to Stand: Supervision         General transfer comment: for safety  Ambulation/Gait Ambulation/Gait assistance: Min guard Gait Distance (Feet): 75 Feet (x2) Assistive device: None Gait Pattern/deviations: Step-through pattern;Decreased stride length;Wide base of support Gait velocity: decr   General Gait Details: Min guard for safety, pt with wide BOS and lateral unsteadiness but no overt LOB. SpO2 85% on 2LO2, increased O2 to 3LO2 with recovery to 89-91% during gait. Verbal cuing for standing rest break, breathing technique.   Stairs             Wheelchair Mobility    Modified Rankin (Stroke Patients Only)       Balance Overall balance assessment: Mild deficits observed, not formally tested                                          Cognition Arousal/Alertness: Awake/alert Behavior During Therapy: WFL for tasks assessed/performed Overall Cognitive Status: Within Functional Limits for tasks assessed                                        Exercises  Other Exercises Other Exercises: Breathing technique: in through nose, out through mouth (pt requires max verbal cuing for breathing technique during mobility and recovery)    General Comments General comments (skin integrity, edema, etc.): HRmax 108 bpm during gait, SpO2 88-91% on 3LO2 during gait      Pertinent Vitals/Pain Pain Assessment: Faces Faces Pain Scale: Hurts little more Pain Location: chest wall Pain Descriptors / Indicators: Sore;Discomfort Pain Intervention(s): Limited activity within patient's tolerance;Monitored during session;Repositioned;Premedicated before  session    Home Living                      Prior Function            PT Goals (current goals can now be found in the care plan section) Acute Rehab PT Goals Patient Stated Goal: less pain PT Goal Formulation: With patient Time For Goal Achievement: 07/27/19 Potential to Achieve Goals: Good Progress towards PT goals: Progressing toward goals    Frequency    Min 3X/week      PT Plan Current plan remains appropriate    Co-evaluation              AM-PAC PT "6 Clicks" Mobility   Outcome Measure  Help needed turning from your back to your side while in a flat bed without using bedrails?: None Help needed moving from lying on your back to sitting on the side of a flat bed without using bedrails?: None Help needed moving to and from a bed to a chair (including a wheelchair)?: A Little Help needed standing up from a chair using your arms (e.g., wheelchair or bedside chair)?: A Little Help needed to walk in hospital room?: A Little Help needed climbing 3-5 steps with a railing? : A Little 6 Click Score: 20    End of Session Equipment Utilized During Treatment: Oxygen;Gait belt Activity Tolerance: Other (comment) (dyspnea) Patient left: in bed;with call bell/phone within reach;with family/visitor present Nurse Communication: Mobility status PT Visit Diagnosis: Pain;Difficulty in walking, not elsewhere classified (R26.2) Pain - Right/Left: Left Pain - part of body: Shoulder     Time: 7124-5809 PT Time Calculation (min) (ACUTE ONLY): 18 min  Charges:  $Gait Training: 8-22 mins                     Zaquan Duffner E, PT Acute Rehabilitation Services Pager [phone]  Office [phone]   Orange Hilligoss D Sharief Wainwright 07/15/2019, 5:18 PM

## 2019-07-15 NOTE — Progress Notes (Signed)
  Echocardiogram 2D Echocardiogram has been performed.  Jennette Dubin 07/15/2019, 11:01 AM

## 2019-07-15 NOTE — Progress Notes (Signed)
Internal Medicine Attending:   I saw and examined the patient. I reviewed Dr Chase Picket note and I agree with the resident's findings and plan as documented in the resident's note.  Patient overall feeling better this morning.  She did have another episode of A. fib with RVR last night despite the 25 mg twice daily oral metoprolol tartrate.  She remains on 1 L of supplemental oxygen via nasal cannula.  She does not report some episodes of hemoptysis that started yesterday/last night.  Agree with plan delineated by Dr. Darrick Meigs.  Suspect mild hemoptysis is due to her pulmonary embolism with pulmonary infarction would be fine to monitor this for now if worsens consult pulmonology for bronchoscopy.  For her A. fib with RVR we will increase her metoprolol tartrate to 50 mg twice daily and obtain echocardiogram.

## 2019-07-15 NOTE — TOC Progression Note (Signed)
Transition of Care [facility]) - Progression Note    Patient Details  Name: Kimberly Walters MRN: [id number] Date of Birth: [birth year]  Transition of Care [facility]) CM/SW [place], [place] Phone Number: 07/15/2019, 3:43 PM  Clinical Narrative:     CSW met with pt and daughter bedside. Confirmed they are okay with [place] rec for PT. CSW explained that [facility] PT arranged with [facility]. No other needs identified. CSW sign off for now.   Expected Discharge Plan: [place] Barriers to Discharge: Continued Medical Work up  Expected Discharge Plan and Services Expected Discharge Plan: [place] arrangements for the past 2 months: Single Family Home                                       Social Determinants of Health (SDOH) Interventions    Readmission Risk Interventions No flowsheet data found.

## 2019-07-15 NOTE — Progress Notes (Signed)
NAME:  KISA FUJII, MRN:  [id number], DOB:  Feb 15, 1950, LOS: 2 ADMISSION DATE:  07/12/2019   Brief History  69 yo female with hypertension who presented to [facility] on 6/21 for acute onset chest pain and shortness of breath and found to have bilateral segmental Pes. She was noted to have LLE pain/swelling 4d prior.  She denied any risk factors however did note a sister with a clotting disorder. Smokes 0.5packs per day.  Subjective/Interm history  Went back into afib with RVR last evening which responded to IV metoprolol  This morning, feels better. Daughter at bedside. She has been out of bed this morning and notes that she tolerated it well.  Patient endorses hemoptysis since yesterday. Initially her sputum was thick and white but now has same consistency as before but now with blood. Patient's daughter concerned given that her father had lung cancer previously.Discussed that CT Chest was negative; however, will need cancer screening as outpatient.  Significant Hospital Events   6/22 admission 6/23 on 3L supplemental O2. Went into afib with RVR>improved with lopressor x2. Continuing to monitor. Starting 25mg  metoprolol po 6/24 had another episode of RVR overnight. 1episode of hemopytsis overnight. Obtaining echo.  Objective   Blood pressure (!) 121/58, pulse 97, temperature 97.9 F (36.6 C), resp. rate 18, height 5\' 4"  (1.626 m), weight 72.6 kg, SpO2 93 %.    No intake or output data in the 24 hours ending 07/15/19 1058 Filed Weights   07/12/19 2349  Weight: 72.6 kg    Examination: General: in NAD Cardiac: regular rate, irregular rhythm. No LE edema Pulm: breathing comfortably on 1L [place]. Lungs clear  Consults:  none  Significant Diagnostic Tests:  6/21 CXR>>mild left basilar aelectasis and/or infiltrate. Small left pleural effusion 6/22  CTA chest>>extensive bilateral pulm arterial filling defects extending from bilateral lower lobe into the segmental and subsegmental branches.  Mild central pulm artery enlargement but with a maintained rV/LV ratio that could suggest right heart strain. Mixed ground glass consolidative opacity in the left lung base concerning for pulm infarct. Additional ground glass in the posterior basal segment of the RLL could reflect infarct vs atelectasis. Emphysema   Summary  69 yo female with hypertension who was admitted to [facility]MT[facility] on 07/13/19 for acute bilateral segmental and subsegmental PE's that is presumably unprovoked and was placed on apixaban. She has continued to require 3L of supplemental oxygen. She developed afib with RVR on 6/23 which responded well to lopressor. We have started her on po metoprolol to have longer lasting rate control. She continues to have intermittent episodes of RVR which continues to respond to lopressor and is most likely due to PE however we are obtaining an echocardiogram today to evaluate for other causes of afib.  Assessment & Plan:  Principal Problem:   Acute pulmonary embolus (HCC) Active Problems:   Hypertension   Tobacco use   Emphysema, unspecified (HCC)   Acute pulmonary embolism (HCC)   Paroxysmal atrial fibrillation with RVR (HCC)  Acute hypoxic respiratory failure due to -Acute unprovoked bilateral segemental and subsegmental PE.  Remains hemodynamically stable--tachycardia improving. Normal LV/RV ratio on CTA so unlikely to have right heart strain. Down to 1L [place]  Hemopytsis (noted 6/24) likely 2/2 to PE Plan --Continue eliquis.  --Will need to monitor hemopytsis and will need to consider bronchoscopy if it worsens  Acute onset atrial fibrillation with RVR--likely precipitated by PE. Had another recurrence last evening.  No electrolyte derangements on BMP and TSH wnl.  EKG without STE/TWI regional patterns.  Plan: --increase metoprolol (started 6/23) to 50mg  po bid for rate control --continue eliquis  --echocardiogram today to r/o other etiologies of afib other than the PE --continuous  tele monitoring  Emphysematous pulmonary changes. As noted on 6/22 CTA. Likely contributing to respiratory symptoms Tobacco use disorder. Patient noting recent cessation 3d prior to admission. Not interested in nicotine patch at this time. Continue to encourage continued cessation at discharge.  Hypertension. Continue holding home antihypertensives  Best practice:  CODE STATUS: full Diet: cardiac DVT for prophylaxis: eliquis Social considerations/Family communication: updated at bedside Dispo: 1-2d. Will need age appropriate cancer screening at discharge   Mitzi Hansen, MD [place] PGY-1 Contact info:  Please page me at [phone] from 7am-5pm M-F.  Please use the I[facility] after hours pager at [phone] after 5pm M-F and on weekends 07/15/19  10:58 AM  Labs    CBC Latest Ref Rng & Units 07/15/2019 07/14/2019 07/13/2019  WBC 4.0 - 10.5 K/uL 14.4(H) 16.1(H) 16.4(H)  Hemoglobin 12.0 - 15.0 g/dL 12.6 12.9 13.9  Hematocrit 36 - 46 % 39.0 40.0 42.9  Platelets 150 - 400 K/uL 176 158 159   BMP Latest Ref Rng & Units 07/14/2019 07/13/2019 07/12/2019  Glucose 70 - 99 mg/dL 169(H) 133(H) 202(H)  BUN 8 - 23 mg/dL 14 11 13   Creatinine 0.44 - 1.00 mg/dL 0.93 0.78 0.81  BUN/Creat Ratio 12 - 28 - - -  Sodium 135 - 145 mmol/L 136 136 137  Potassium 3.5 - 5.1 mmol/L 3.8 3.9 4.0  Chloride 98 - 111 mmol/L 101 101 103  CO2 22 - 32 mmol/L 22 23 24   Calcium 8.9 - 10.3 mg/dL 8.2(L) 8.3(L) 8.5(L)

## 2019-07-15 NOTE — Progress Notes (Signed)
Notified by CCMD that pt converted into afib with a heart rate of 140s. This RN assessed pt, paged MD Koleen Distance, and completed a EKG.  MD put order in for IV Metoprolol 5mg 

## 2019-07-16 DIAGNOSIS — J9601 Acute respiratory failure with hypoxia: Secondary | ICD-10-CM

## 2019-07-16 LAB — CBC
HCT: 36.2 % (ref 36.0–46.0)
Hemoglobin: 11.8 g/dL — ABNORMAL LOW (ref 12.0–15.0)
MCH: 30.4 pg (ref 26.0–34.0)
MCHC: 32.6 g/dL (ref 30.0–36.0)
MCV: 93.3 fL (ref 80.0–100.0)
Platelets: 186 10*3/uL (ref 150–400)
RBC: 3.88 MIL/uL (ref 3.87–5.11)
RDW: 13.4 % (ref 11.5–15.5)
WBC: 13.4 10*3/uL — ABNORMAL HIGH (ref 4.0–10.5)
nRBC: 0 % (ref 0.0–0.2)

## 2019-07-16 MED ORDER — METOPROLOL TARTRATE 50 MG PO TABS: 50.0000 mg | ORAL_TABLET | Freq: Two times a day (BID) | ORAL | 0 refills | Status: DC

## 2019-07-16 MED ORDER — APIXABAN 5 MG PO TABS: 5.0000 mg | ORAL_TABLET | Freq: Two times a day (BID) | ORAL | 0 refills | Status: DC

## 2019-07-16 MED ORDER — APIXABAN 5 MG PO TABS: 10.0000 mg | ORAL_TABLET | Freq: Two times a day (BID) | ORAL | 0 refills | Status: DC

## 2019-07-16 MED FILL — ELIQUIS 5 MG TABLET: 5 | 30 days supply | Qty: 68 | Fill #0

## 2019-07-16 MED FILL — METOPROLOL TARTRATE 50 MG T: 50 | 30 days supply | Qty: 60 | Fill #0

## 2019-07-16 NOTE — Progress Notes (Signed)
The chaplain followed up with the patient concerning the completion of their AD paperwork. The chaplain notified the family that a notary is not currently available, but they can get it notarized outside of the hospital. The family plans to complete the paperwork after discharge. The chaplain is no longer needed for this patient.  Brion Aliment Chaplain Resident For questions concerning this note please contact me by pager [phone]

## 2019-07-16 NOTE — TOC Transition Note (Signed)
Transition of Care [facility]) - CM/SW Discharge Note   Patient Details  Name: Kimberly Walters MRN: [id number] Date of Birth: [birth year]  Transition of Care [facility]) CM/SW Contact:  Emeterio Reeve, [place] Phone Number: 07/16/2019, 2:14 PM   Clinical Narrative:     Pt is discharging home with HHpt/ot through [facility].   Pt will be discharging home with oxygen from [employer]. Oxygen will be delivered to the pts home due to pt not wanting to wait for it at the hospital.   Final next level of care: [place] Barriers to Discharge: Barriers Resolved   Patient Goals and CMS Choice Patient states their goals for this hospitalization and ongoing recovery are:: "To return home"      Discharge Placement                Patient to be transferred to facility by: Family      Discharge Plan and Services                DME Arranged: Oxygen DME Agency: [facility] Date DME Agency Contacted: 07/16/19 Time DME Agency Contacted: 1214 Representative spoke with at DME Agency: [place]: PT, OT HH Agency: [facility] Date [place]: 07/15/19   Representative spoke with at [place]: [place] ([place]) Interventions     Readmission Risk Interventions No flowsheet data found.   Emeterio Reeve, Latanya Presser, [place] Social Worker [phone]

## 2019-07-16 NOTE — Discharge Summary (Signed)
Name: Kimberly Walters MRN: [id number] DOB: Aug 05, 1950 69 y.o. PCP: Delice Bison, DO  Date of Admission: 07/12/2019 10:46 PM Date of Discharge: 07/16/2019 Attending Physician: Lucious Groves, DO  Discharge Diagnosis: 1.  Acute hypoxic respiratory failure 2.  Acute bilateral segmental and subsegmental pulmonary embolisms 3.  A. fib with RVR 4.  Emphysematous changes on chest imaging  Discharge Medications: Allergies as of 07/16/2019   No Known Allergies     Medication List    TAKE these medications   acetaminophen 325 MG tablet Commonly known as: TYLENOL Take 650 mg by mouth every 6 (six) hours as needed.   apixaban 5 MG Tabs tablet Commonly known as: ELIQUIS Take 2 tablets (10 mg total) by mouth 2 (two) times daily for 4 days.   apixaban 5 MG Tabs tablet Commonly known as: ELIQUIS Take 1 tablet (5 mg total) by mouth 2 (two) times daily. Start taking on: July 19, 2019   furosemide 20 MG tablet Commonly known as: LASIX TAKE 1 TABLET (20 MG TOTAL) BY MOUTH DAILY AS NEEDED (SWELLING IN YOUR LEGS).   lisinopril 20 MG tablet Commonly known as: ZESTRIL TAKE 1 TABLET BY MOUTH EVERY DAY   metoprolol tartrate 50 MG tablet Commonly known as: LOPRESSOR Take 1 tablet (50 mg total) by mouth 2 (two) times daily.   omeprazole 20 MG capsule Commonly known as: PRILOSEC TAKE 1 CAPSULE BY MOUTH EVERY DAY            Durable Medical Equipment  (From admission, onward)         Start     Ordered   07/16/19 0000  For home use only DME oxygen       Question Answer Comment  Length of Need 6 Months   Oxygen delivery system Gas      07/16/19 1118          Disposition and follow-up:   Kimberly Walters was discharged from [facility] in Stable condition.  At the hospital follow up visit please address:  1.  Acute hypoxic respiratory failure secondary to acute bilateral pulmonary embolisms.  Considering presumably unprovoked at this time.  No sign of  significant right heart strain at this time.  -Continue Eliquis for at least 6 months -Reassess need for supplemental oxygen  2.  Acute onset atrial fibrillation with RVR.  No prior history of atrial fibrillation.  Most likely, precipitated by the pulmonary embolism.  Metoprolol started during her hospitalization and titrated up to 50 mg which he seemed to be responding well to. -Continue metoprolol 50 mg -Consider referral to A. fib clinic -Continue Eliquis  3.  Emphysematous lung changes on imaging -Consider obtaining PFTs after recovery from her PE  Labs / imaging needed at time of follow-up: None  Pending labs/ test needing follow-up: None  Follow-up Appointments: PCP in 3 to 5 days   Hospital Course: 69 year old female with hypertension who presented to [facility] emergency department on 07/12/2019 for acute onset chest pain associated with shortness of breath.  Imaging in the ED revealed bilateral pulmonary embolisms.  She was started on Eliquis for this. She was also in acute hypoxic respiratory failure on admission requiring 3 L of supplemental oxygen.  This was titrated down to 1 L at rest.  PT was consulted and she did require 3 L to maintain her oxygen saturations greater than 88% with ambulation.  Her hospitalization was extended due to acute onset of atrial fibrillation with RVR.  Metoprolol was started and titrated up to 50 mg after which time she had no further episodes but remained in atrial fibrillation.  We did obtain an echocardiogram which did not show any valvular disease or any other abnormalities to speak for the atrial fibrillation so this was most likely due to the pulmonary embolisms and will hopefully resolve on its own after resolution.  Discharge Vitals:   BP 109/76 (BP Location: Right Arm)   Pulse 95   Temp (!) 97.3 F (36.3 C)   Resp 19   Ht 5\' 4"  (1.626 m)   Wt 72.6 kg   SpO2 93%   BMI 27.46 kg/m   Pertinent Labs, Studies, and Procedures:   6/22   CTA chest>>extensive bilateral pulm arterial filling defects extending from bilateral lower lobe into the segmental and subsegmental branches. Mild central pulm artery enlargement but with a maintained rV/LV ratio that could suggest right heart strain. Mixed ground glass consolidative opacity in the left lung base concerning for pulm infarct. Additional ground glass in the posterior basal segment of the RLL could reflect infarct vs atelectasis. Emphysema  6/24 echocardiogram>>LVEF 50-55%, unable to evaluate for regional wall abnormalities, intermittent septal bounce possible due to PE, mild LV dilation and hypertrophy; normal diastolic parameters; normal RV size and function; no evidence of valvular disease; IVC dilated with >50% resp variability  CBC Latest Ref Rng & Units 07/16/2019 07/15/2019 07/14/2019  WBC 4.0 - 10.5 K/uL 13.4(H) 14.4(H) 16.1(H)  Hemoglobin 12.0 - 15.0 g/dL 11.8(L) 12.6 12.9  Hematocrit 36 - 46 % 36.2 39.0 40.0  Platelets 150 - 400 K/uL 186 176 158   BMP Latest Ref Rng & Units 07/14/2019 07/13/2019 07/12/2019  Glucose 70 - 99 mg/dL 169(H) 133(H) 202(H)  BUN 8 - 23 mg/dL 14 11 13   Creatinine 0.44 - 1.00 mg/dL 0.93 0.78 0.81  BUN/Creat Ratio 12 - 28 - - -  Sodium 135 - 145 mmol/L 136 136 137  Potassium 3.5 - 5.1 mmol/L 3.8 3.9 4.0  Chloride 98 - 111 mmol/L 101 101 103  CO2 22 - 32 mmol/L 22 23 24   Calcium 8.9 - 10.3 mg/dL 8.2(L) 8.3(L) 8.5(L)     Discharge Instructions: Discharge Instructions    For home use only DME oxygen   Complete by: As directed    Length of Need: 6 Months   Oxygen delivery system: Gas      Signed: Mitzi Hansen, MD 07/16/2019, 11:39 AM   Pager: [phone]

## 2019-07-16 NOTE — Progress Notes (Signed)
Pt and daughter refusing to wait until Oxygen delivered to the room, they are requesting to leave now.  This Designer, multimedia provided education to pt and daughter on risk of leaving hospital without oxygen delivered to room.

## 2019-07-16 NOTE — Progress Notes (Addendum)
SATURATION QUALIFICATIONS: (This note is used to comply with regulatory documentation for home oxygen)  Patient Saturations on Room Air at Rest = 91%  Patient Saturations on Room Air while Ambulating = 86%  Patient Saturations on 2 Liters of oxygen while Ambulating = 96%  Please briefly explain why patient needs home oxygen: Pt oxygen saturations 91% on room air at rest. Pt oxygen saturations decrease to 86% with ambulation.

## 2019-07-16 NOTE — Progress Notes (Signed)
NAME:  Kimberly Walters, MRN:  [id number], DOB:  May 31, 1950, LOS: 3 ADMISSION DATE:  07/12/2019   Brief History  69 yo female with hypertension who presented to [facility] on 6/21 for acute onset chest pain and shortness of breath and found to have bilateral segmental Pes. She was noted to have LLE pain/swelling 4d prior.  She denied any risk factors however did note a sister with a clotting disorder. Smokes 0.5packs per day.  Subjective/Interm history  No significant overnight events  Patient evaluated at bedside this morning. Her daughter is at bedside. Patient reports feeling better this morning. She notes that she was able to get up to the bathroom without significant dyspnea but does have productive cough with hemoptysis (improving). She does endorse some epigastric pain radiating to the left side.  Patient also able to work with physical therapy yesterday with 3L O2. Discussed discharge planning with oxygen and follow up with PCP. Patient and daughter express understanding.  Significant Hospital Events   6/22 admission 6/23 on 3L supplemental O2. Went into afib with RVR>improved with lopressor x2. Continuing to monitor. Starting 25mg  metoprolol po 6/24 had another episode of RVR overnight. 1episode of hemopytsis overnight. Obtaining echo.  Objective   Blood pressure 127/60, pulse 95, temperature 98.7 F (37.1 C), temperature source Oral, resp. rate 18, height 5\' 4"  (1.626 m), weight 72.6 kg, SpO2 93 %.    No intake or output data in the 24 hours ending 07/16/19 0500 Filed Weights   07/12/19 2349  Weight: 72.6 kg    Examination: General: resting comfortably Pulm: breathing comfortably on 1L supplemental O2. Lungs clear Cardiac: regular rate, irregular rhythm.   Consults:  none  Significant Diagnostic Tests:  6/21 CXR>>mild left basilar aelectasis and/or infiltrate. Small left pleural effusion 6/22  CTA chest>>extensive bilateral pulm arterial filling defects extending from  bilateral lower lobe into the segmental and subsegmental branches. Mild central pulm artery enlargement but with a maintained rV/LV ratio that could suggest right heart strain. Mixed ground glass consolidative opacity in the left lung base concerning for pulm infarct. Additional ground glass in the posterior basal segment of the RLL could reflect infarct vs atelectasis. Emphysema  6/24 echocardiogram>>LVEF 50-55%, unable to evaluate for regional wall abnormalities, intermittent septal bounce possible due to PE, mild LV dilation and hypertrophy; normal diastolic parameters; normal RV size and function; no evidence of valvular disease; IVC dilated with >50% resp variability   Summary  69 yo female with hypertension who was admitted to [facility] on 07/13/19 for acute bilateral segmental and subsegmental PE's that is presumably unprovoked and was placed on apixaban. She has continued to require 3L of supplemental oxygen. She developed afib with RVR on 6/23 which responded well to lopressor. We have started her on po metoprolol to have longer lasting rate control. We obtained an echocardiogram yesterday which did not show any typical causes of atrial fibrillation. She is medically stable for discharge today.  Assessment & Plan:  Principal Problem:   Acute pulmonary embolus (HCC) Active Problems:   Hypertension   Tobacco use   Emphysema, unspecified (HCC)   Acute pulmonary embolism (HCC)   Paroxysmal atrial fibrillation with RVR (HCC)  Acute hypoxic respiratory failure due to -Acute unprovoked bilateral segemental and subsegmental PE.  Remains hemodynamically stable--tachycardia improving.  Down to 1L [place]  Hemopytsis (noted 6/24) likely 2/2 to PE Plan --Continue eliquis.  --Will need to monitor hemopytsis and will need to consider bronchoscopy if it worsens  Acute onset atrial fibrillation  with RVR--likely precipitated by PE. Rate controlled on metoprolol with HR 80s-90s. Echo with septal bouncing  consistent with PE but no valvular abnormalities or other etiology identifiable of afib. Plan: --continue 50mg  metoprolol --continue eliquis   Emphysematous pulmonary changes. As noted on 6/22 CTA. Likely contributing to respiratory symptoms  Tobacco use disorder. Patient noting recent cessation 3d prior to admission. Not interested in nicotine patch at this time. Continue to encourage continued cessation at discharge.  Hypertension. Will resume lisinopril at discharge.  Best practice:  CODE STATUS: full Diet: cardiac DVT for prophylaxis: eliquis Social considerations/Family communication: updated at bedside Dispo: discharge today. Will need age appropriate cancer screening at discharge   Mitzi Hansen, MD [place] PGY-1 Contact info:  Please page me at [phone] from 7am-5pm M-F.  Please use the [facility] after hours pager at [phone] after 5pm M-F and on weekends 07/16/19  5:00 AM  Labs    CBC Latest Ref Rng & Units 07/15/2019 07/14/2019 07/13/2019  WBC 4.0 - 10.5 K/uL 14.4(H) 16.1(H) 16.4(H)  Hemoglobin 12.0 - 15.0 g/dL 12.6 12.9 13.9  Hematocrit 36 - 46 % 39.0 40.0 42.9  Platelets 150 - 400 K/uL 176 158 159   BMP Latest Ref Rng & Units 07/14/2019 07/13/2019 07/12/2019  Glucose 70 - 99 mg/dL 169(H) 133(H) 202(H)  BUN 8 - 23 mg/dL 14 11 13   Creatinine 0.44 - 1.00 mg/dL 0.93 0.78 0.81  BUN/Creat Ratio 12 - 28 - - -  Sodium 135 - 145 mmol/L 136 136 137  Potassium 3.5 - 5.1 mmol/L 3.8 3.9 4.0  Chloride 98 - 111 mmol/L 101 101 103  CO2 22 - 32 mmol/L 22 23 24   Calcium 8.9 - 10.3 mg/dL 8.2(L) 8.3(L) 8.5(L)

## 2019-07-18 LAB — CULTURE, BLOOD (ROUTINE X 2)
Culture: NO GROWTH
Culture: NO GROWTH
Special Requests: ADEQUATE

## 2019-07-19 ENCOUNTER — Telehealth: Payer: Self-pay | Admitting: *Deleted

## 2019-07-19 NOTE — Telephone Encounter (Signed)
Call from [facility] - requesting verbal order for PT/OT. Per chart, hospital SW arranged services w/[facility] stated she had called them, they do not have PT/OT. Verbal order given for "PT/OT/skilled nursing for evaluation". If inappropriate, let me know. Thanks

## 2019-07-19 NOTE — Telephone Encounter (Signed)
I agree, thank you.

## 2019-07-20 ENCOUNTER — Observation Stay (HOSPITAL_COMMUNITY)
Admission: EM | Admit: 2019-07-20 | Discharge: 2019-07-21 | Disposition: A | Discharge status: Home Health | Payer: Medicare HMO | Attending: Internal Medicine | Admitting: Internal Medicine

## 2019-07-20 ENCOUNTER — Emergency Department (HOSPITAL_COMMUNITY): Payer: Medicare HMO

## 2019-07-20 ENCOUNTER — Other Ambulatory Visit: Payer: Self-pay

## 2019-07-20 DIAGNOSIS — I10 Essential (primary) hypertension: Secondary | ICD-10-CM | POA: Insufficient documentation

## 2019-07-20 DIAGNOSIS — Z20822 Contact with and (suspected) exposure to covid-19: Secondary | ICD-10-CM | POA: Diagnosis not present

## 2019-07-20 DIAGNOSIS — I2699 Other pulmonary embolism without acute cor pulmonale: Principal | ICD-10-CM | POA: Insufficient documentation

## 2019-07-20 DIAGNOSIS — R0902 Hypoxemia: Secondary | ICD-10-CM | POA: Diagnosis not present

## 2019-07-20 DIAGNOSIS — R079 Chest pain, unspecified: Secondary | ICD-10-CM | POA: Diagnosis not present

## 2019-07-20 DIAGNOSIS — F17210 Nicotine dependence, cigarettes, uncomplicated: Secondary | ICD-10-CM | POA: Insufficient documentation

## 2019-07-20 DIAGNOSIS — J90 Pleural effusion, not elsewhere classified: Secondary | ICD-10-CM | POA: Diagnosis not present

## 2019-07-20 DIAGNOSIS — Z7901 Long term (current) use of anticoagulants: Secondary | ICD-10-CM | POA: Diagnosis not present

## 2019-07-20 DIAGNOSIS — D649 Anemia, unspecified: Secondary | ICD-10-CM | POA: Diagnosis not present

## 2019-07-20 DIAGNOSIS — Z79899 Other long term (current) drug therapy: Secondary | ICD-10-CM | POA: Diagnosis not present

## 2019-07-20 DIAGNOSIS — Z8249 Family history of ischemic heart disease and other diseases of the circulatory system: Secondary | ICD-10-CM | POA: Diagnosis not present

## 2019-07-20 DIAGNOSIS — I491 Atrial premature depolarization: Secondary | ICD-10-CM | POA: Insufficient documentation

## 2019-07-20 DIAGNOSIS — I4891 Unspecified atrial fibrillation: Secondary | ICD-10-CM | POA: Diagnosis not present

## 2019-07-20 DIAGNOSIS — R0602 Shortness of breath: Secondary | ICD-10-CM | POA: Diagnosis not present

## 2019-07-20 DIAGNOSIS — R0789 Other chest pain: Secondary | ICD-10-CM | POA: Diagnosis not present

## 2019-07-20 DIAGNOSIS — I480 Paroxysmal atrial fibrillation: Secondary | ICD-10-CM | POA: Insufficient documentation

## 2019-07-20 LAB — CBC
HCT: 34.1 % — ABNORMAL LOW (ref 36.0–46.0)
Hemoglobin: 10.7 g/dL — ABNORMAL LOW (ref 12.0–15.0)
MCH: 29.9 pg (ref 26.0–34.0)
MCHC: 31.4 g/dL (ref 30.0–36.0)
MCV: 95.3 fL (ref 80.0–100.0)
Platelets: 331 10*3/uL (ref 150–400)
RBC: 3.58 MIL/uL — ABNORMAL LOW (ref 3.87–5.11)
RDW: 13.9 % (ref 11.5–15.5)
WBC: 11.7 10*3/uL — ABNORMAL HIGH (ref 4.0–10.5)
nRBC: 0 % (ref 0.0–0.2)

## 2019-07-20 LAB — BASIC METABOLIC PANEL
Anion gap: 9 (ref 5–15)
BUN: 14 mg/dL (ref 8–23)
CO2: 27 mmol/L (ref 22–32)
Calcium: 8.4 mg/dL — ABNORMAL LOW (ref 8.9–10.3)
Chloride: 106 mmol/L (ref 98–111)
Creatinine, Ser: 0.64 mg/dL (ref 0.44–1.00)
GFR calc Af Amer: >60 mL/min (ref >60)
GFR calc non Af Amer: >60 mL/min (ref >60)
Glucose, Bld: 124 mg/dL — ABNORMAL HIGH (ref 70–99)
Potassium: 4.1 mmol/L (ref 3.5–5.1)
Sodium: 142 mmol/L (ref 135–145)

## 2019-07-20 LAB — TROPONIN I (HIGH SENSITIVITY)
Troponin I (High Sensitivity): 7 ng/L (ref <18)
Troponin I (High Sensitivity): 7 ng/L (ref <18)

## 2019-07-20 LAB — BRAIN NATRIURETIC PEPTIDE: B Natriuretic Peptide: 157.7 pg/mL — ABNORMAL HIGH (ref 0.0–100.0)

## 2019-07-20 LAB — SARS CORONAVIRUS 2 BY RT PCR (HOSPITAL ORDER, PERFORMED IN ~~LOC~~ HOSPITAL LAB): SARS Coronavirus 2: NEGATIVE

## 2019-07-20 MED ORDER — IOHEXOL 350 MG/ML SOLN
75.0000 mL | Freq: Once | INTRAVENOUS | Status: AC | PRN
  Administered 2019-07-20: 75 mL via INTRAVENOUS

## 2019-07-20 MED ORDER — HYDROCODONE-ACETAMINOPHEN 5-325 MG PO TABS
1.0000 | ORAL_TABLET | Freq: Once | ORAL | Status: AC
  Administered 2019-07-20: 1 via ORAL
  Filled 2019-07-20: qty 1

## 2019-07-20 MED ORDER — METOPROLOL TARTRATE 50 MG PO TABS
50.0000 mg | ORAL_TABLET | Freq: Two times a day (BID) | ORAL | Status: DC
  Administered 2019-07-20 – 2019-07-21 (×2): 50 mg via ORAL
  Filled 2019-07-20 (×2): qty 1

## 2019-07-20 MED ORDER — KETOROLAC TROMETHAMINE 15 MG/ML IJ SOLN
15.0000 mg | Freq: Four times a day (QID) | INTRAMUSCULAR | Status: DC | PRN
  Administered 2019-07-20: 15 mg via INTRAVENOUS
  Filled 2019-07-20: qty 1

## 2019-07-20 MED ORDER — SODIUM CHLORIDE 0.9% FLUSH: 3.0000 mL | Freq: Once | INTRAVENOUS | Status: DC

## 2019-07-20 MED ORDER — HEPARIN (PORCINE) 25000 UT/250ML-% IV SOLN
1300.0000 [IU]/h | INTRAVENOUS | Status: DC
  Administered 2019-07-20: 1150 [IU]/h via INTRAVENOUS
  Filled 2019-07-20: qty 250

## 2019-07-20 MED ORDER — SODIUM CHLORIDE 0.9 % IV BOLUS
500.0000 mL | Freq: Once | INTRAVENOUS | Status: AC
  Administered 2019-07-20: 500 mL via INTRAVENOUS

## 2019-07-20 MED ORDER — HEPARIN BOLUS VIA INFUSION
2400.0000 [IU] | Freq: Once | INTRAVENOUS | Status: AC
  Administered 2019-07-20: 2400 [IU] via INTRAVENOUS
  Filled 2019-07-20: qty 2400

## 2019-07-20 NOTE — ED Notes (Signed)
Pt assisted to the bathroom in wheelchair. RN noted increased SOB with exertion.

## 2019-07-20 NOTE — ED Notes (Signed)
Pt transported to XR.  

## 2019-07-20 NOTE — H&P (Signed)
Date: 07/20/2019               Patient Name:  Kimberly Walters MRN: [id number]  DOB: [birth year] Age / Sex: 69 y.o., female   PCP: Delice Bison, DO         Medical Service: Internal Medicine Teaching Service         Attending Physician: Dr. Lucious Groves, DO    First Contact: Dr. Darrick Meigs Pager: [phone]  Second Contact: Dr. Sherry Ruffing  Pager: [phone]       After Hours (After 5p/  First Contact Pager: [phone]  weekends / holidays): Second Contact Pager: [phone]   Chief Complaint: chest pain, shortness of breath   History of Present Illness: Kimberly Walters is a 69 y/o female recently  admitted with acute hypoxic respiratory failure secondary to bilateral pulmonary emboli complicated by pulmonary infarct presents with worsening chest pain and increased work of breathing.  She has been compliant with Eliquis since discharge and has not missed any doses.  Also endorses cough with blood tinged sputum production. Her sister has anti-cardiolipin syndrome and is anticoagulated with warfarin. Since her discharge she has been tired and resting mostly in bed. On the afternoon of 6/28 she noticed some left leg cramping and worsening sob. She said this afternoon she was having dull substernal chest pain and it started to feel sharp. Her SOB was worsening and she came to the ED. She had an episode of night sweats on 6/25.Denies fevers, chills, syncope, palpitations, n/v, abdominal pain, urinary symptoms, changes in BMs, lower extremity edema    Meds:  Current Meds  Medication Sig  . acetaminophen (TYLENOL) 500 MG tablet Take 1,000 mg by mouth as needed for moderate pain.   [name] apixaban (ELIQUIS) 5 MG TABS tablet Take 1 tablet (5 mg total) by mouth 2 (two) times daily.  [name] lisinopril (ZESTRIL) 20 MG tablet TAKE 1 TABLET BY MOUTH EVERY DAY (Patient taking differently: Take 20 mg by mouth daily. )  . metoprolol tartrate (LOPRESSOR) 50 MG tablet Take 1 tablet (50 mg total) by mouth 2 (two) times  daily.     Allergies: Allergies as of 07/20/2019  . (No Known Allergies)   Past Medical History:  Diagnosis Date  . Hypertension     Family History: positive for sister with clotting disorder. Mother died from CHF. Father had DM and CAD; off of her sisters have DM  Social History: lives at home with her son; smokes 1/2 ppd but has not had smoked since first hospitalization; no EtOH or illicit drug use   Review of Systems: A complete ROS was negative except as per HPI.   Physical Exam: Blood pressure (!) 128/59, pulse 79, temperature 98.3 F (36.8 C), temperature source Oral, resp. rate (!) 23, height 5\' 4"  (1.626 m), weight 72.1 kg, SpO2 96 %.   General: NAD, nl appearance HE: Normocephalic, atraumatic , EOMI, Conjunctivae normal ENT: No congestion, no rhinorrhea, no exudate or erythema  Cardiovascular: Normal rate, regular rhythm.  No murmurs, rubs, or gallops Pulmonary : Tachypnic, on nasal canula, no increased work of breathing. No wheezes, rales, or rhonchi Abdominal: soft, nontender,  bowel sounds present Musculoskeletal: no swelling , deformity, injury ,or tenderness in extremities, Skin: Warm, dry , no bruising, erythema, or rash Psychiatric/Behavioral:  normal mood, normal behavior  Neuro: AOx4  Results for orders placed or performed during the hospital encounter of 07/20/19 (from the past 24 hour(s))  Basic metabolic panel  Status: Abnormal   Collection Time: 07/20/19  4:51 PM  Result Value Ref Range   Sodium 142 135 - 145 mmol/L   Potassium 4.1 3.5 - 5.1 mmol/L   Chloride 106 98 - 111 mmol/L   CO2 27 22 - 32 mmol/L   Glucose, Bld 124 (H) 70 - 99 mg/dL   BUN 14 8 - 23 mg/dL   Creatinine, Ser 0.64 0.44 - 1.00 mg/dL   Calcium 8.4 (L) 8.9 - 10.3 mg/dL   GFR calc non Af Amer >60 >60 mL/min   GFR calc Af Amer >60 >60 mL/min   Anion gap 9 5 - 15  CBC     Status: Abnormal   Collection Time: 07/20/19  4:51 PM  Result Value Ref Range   WBC 11.7 (H) 4.0 - 10.5  K/uL   RBC 3.58 (L) 3.87 - 5.11 MIL/uL   Hemoglobin 10.7 (L) 12.0 - 15.0 g/dL   HCT 34.1 (L) 36 - 46 %   MCV 95.3 80.0 - 100.0 fL   MCH 29.9 26.0 - 34.0 pg   MCHC 31.4 30.0 - 36.0 g/dL   RDW 13.9 11.5 - 15.5 %   Platelets 331 150 - 400 K/uL   nRBC 0.0 0.0 - 0.2 %  Troponin I (High Sensitivity)     Status: None   Collection Time: 07/20/19  4:51 PM  Result Value Ref Range   Troponin I (High Sensitivity) 7 <18 ng/L  Troponin I (High Sensitivity)     Status: None   Collection Time: 07/20/19  7:13 PM  Result Value Ref Range   Troponin I (High Sensitivity) 7 <18 ng/L  Brain natriuretic peptide     Status: Abnormal   Collection Time: 07/20/19  7:13 PM  Result Value Ref Range   B Natriuretic Peptide 157.7 (H) 0.0 - 100.0 pg/mL    EKG: personally reviewed my interpretation is sinus rhythm, with a PVC  CXR: personally reviewed my interpretation is small right, moderate right pleural effusions,   CT Angio Chest IMPRESSION: 1. Acute bilateral pulmonary emboli are again demonstrated. Clot burden on the right appears mildly decreased compared to the prior chest CT, however there is more proximal extension of thrombus on the left with thrombus now visible in the distal left main pulmonary artery and more thrombus in the descending left pulmonary artery. RV LV ratio remains non elevated, no right heart strain by CT measurements. 2. Moderate left pleural effusion, increased compared to prior with worsening consolidation in the left lower lobe which may reflect pneumonia, pulmonary infarct, or combination of the 2.  Assessment & Plan by Problem: Active Problems:   Pulmonary embolism [facility])  Ms. Cardy is a 69 y/o female recently admitted with acute hypoxic respiratory failure secondary to bilateral pulmonary emboli complicated by pulmonary infarct presents with worsening chest pain and increased work of breathing.   Pulmonary Embolism CT Angio shows worsening of PE on left side compared  to CT angio one week ago, specifically more proximal extension of thrombus on the left with thrombus now visible in the distal left main pulmonary arter and more thrombus in the descending left pulmonary artery. Right sided clot burden slightly decreased.  BNP 157, but no signs of right sided heart failure or sign of right heart strain on CT. Patient has been compliant on abixiban and taking as directed. Started on 5mg  BID dose today. She has been started on unfractioned heparin in ED. Her vitals are stable and started on supplemental oxygen for  comfort. Patient has no history suggestive of malabsorption. It is more likely patient to have a hypercoagulable disorder. Her sister has antiphospholipid syndrome and she will likely need workup for hypercoagulable state. Patient has consolidation in left lower lobe. This most likely represents pulmonary infarct, however can not rule out pneumonia. Patients leukocytosis is trending down since last admission other symptom of blood tinged sputum can also be explained by PE. Afebrile. Pleural effusion have worsened, but patient is able to maintain normal oxygen saturation and only on supplemental oxygen for comfort as mentioned above. No need for thoracentesis at this time.   Plan: - Continue Heparin -Toradol PRN for pain - Procalcitonin to guide decision on possible pneumonia.  - Trend CBC  Paroxysmal atrial fibrillation Patient in sinus rhythm on exam. In atrial fibrillation on last hospitalization. - Continue Lopressor 50 mg  Diet:Regular VTE px: Heparin Code: Full  Dispo: Admit patient to Observation with expected length of stay less than 2 midnights.  Signed: Madalyn Rob, MD 07/20/2019, 10:12 PM  After 5pm on weekdays and 1pm on weekends: On Call pager: [phone]

## 2019-07-20 NOTE — ED Notes (Signed)
Pt 94% on room air RR 27-29. Placed on 2 L O2 for comfort. 96% with RR 22-24

## 2019-07-20 NOTE — ED Notes (Signed)
Pt transported to CT ?

## 2019-07-20 NOTE — ED Provider Notes (Signed)
Kimberly Walters Provider Note   CSN: [id number] Arrival date & time: 07/20/19  1635     History Chief Complaint  Patient presents with  . Chest Pain  . Shortness of Breath    Kimberly Walters is a 69 y.o. female.  HPI   Pt is a 69 y/o female with a h/o HTN, acute respiratory failure with hypoxia, afib with RVR, and recently dx PE (07/13/2019) who presents to the ED today for eval of chest pain and shortness of breath. Chest pain located to the left side of the chest.  Chest pain has been present all day and worsened around 3pm. Pain felt like a stabbing pain. Pain is worse with breathing. Rates pain initially a 10/10, now pain rated 4/10. She took some tylenol. She states that she is not more SOB that she has been since being diagnosed with a PE. She has been coughing and has had some hemoptysis as well. She had more copious hemoptysis today that was darker than it has been today.   She has been compliant with her Eliquis.   Past Medical History:  Diagnosis Date  . Hypertension     Patient Active Problem List   Diagnosis Date Noted  . Acute respiratory failure with hypoxia ([place])   . Paroxysmal atrial fibrillation with RVR ([place])   . Acute pulmonary embolus ([place]) 07/13/2019  . Emphysema, unspecified ([place]) 07/13/2019  . Acute pulmonary embolism ([place]) 07/13/2019  . Generalized postprandial abdominal pain 03/16/2019  . Screening for colon cancer 03/16/2019  . 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 Swelling of lower extremity 11/16/2018  . Hypertension 11/16/2018  . Mitral valve prolapse 11/16/2018  . Tobacco use 11/16/2018  . Bereavement 11/16/2018    Past Surgical History:  Procedure Laterality Date  . HYSTERECTOMY ABDOMINAL WITH SALPINGO-OOPHORECTOMY  1975     OB History   No obstetric history on file.     Family History  Problem Relation Age of Onset  . Diabetes Mother   . Diabetes Father   . CAD Father   .  Hypothyroidism Sister   . Melanoma Sister   . Ovarian cancer Other   . Uterine cancer Other   . Breast cancer Other   . Diabetes Son   . Uterine cancer Niece   . Pancreatic cancer Nephew     Social History   Tobacco Use  . Smoking status: Current Every Day Smoker    Packs/day: 0.50    Types: Cigarettes  . Smokeless tobacco: Never Used  . Tobacco comment: cutting back   Substance Use Topics  . Alcohol use: No  . Drug use: No    Home Medications Prior to Admission medications   Medication Sig Start Date End Date Taking? Authorizing Provider  acetaminophen (TYLENOL) 325 MG tablet Take 650 mg by mouth every 6 (six) hours as needed.    [provider]  apixaban (ELIQUIS) 5 MG TABS tablet Take 2 tablets (10 mg total) by mouth 2 (two) times daily for 4 days. 07/13/19 07/17/19  Mitzi Hansen, MD  apixaban (ELIQUIS) 5 MG TABS tablet Take 1 tablet (5 mg total) by mouth 2 (two) times daily. 07/19/19 09/17/19  Mitzi Hansen, MD  furosemide (LASIX) 20 MG tablet TAKE 1 TABLET (20 MG TOTAL) BY MOUTH DAILY AS NEEDED (SWELLING IN YOUR LEGS). Patient not taking: Reported on 07/12/2019 04/05/19   Aldine Contes, MD  lisinopril (ZESTRIL) 20 MG tablet TAKE 1 TABLET BY MOUTH EVERY DAY Patient  taking differently: Take 20 mg by mouth daily.  04/12/19   [place], [name], DO  metoprolol tartrate (LOPRESSOR) 50 MG tablet Take 1 tablet (50 mg total) by mouth 2 (two) times daily. 07/16/19 08/15/19  Mitzi Hansen, MD  omeprazole (PRILOSEC) 20 MG capsule TAKE 1 CAPSULE BY MOUTH EVERY DAY Patient not taking: Reported on 07/12/2019 04/12/19   Modena Nunnery D, DO    Allergies    Patient has no known allergies.  Review of Systems   Review of Systems  Constitutional: Negative for chills and fever.  HENT: Negative for ear pain and sore throat.   Eyes: Negative for visual disturbance.  Respiratory: Positive for shortness of breath. Negative for cough.   Cardiovascular: Positive for chest  pain and leg swelling. Negative for palpitations.  Gastrointestinal: Negative for abdominal pain, constipation, diarrhea, nausea and vomiting.  Genitourinary: Negative for dysuria and hematuria.  Musculoskeletal: Negative for back pain.  Skin: Negative for rash.  Neurological: Negative for headaches.  All other systems reviewed and are negative.   Physical Exam Updated Vital Signs BP (!) 120/56   Pulse 67   Temp 98.3 F (36.8 C) (Oral)   Resp (!) 24   Ht 5\' 4"  (1.626 m)   Wt 72.1 kg   SpO2 100%   BMI 27.29 kg/m   Physical Exam Vitals and nursing note reviewed.  Constitutional:      General: She is not in acute distress.    Appearance: She is well-developed.  HENT:     Head: Normocephalic and atraumatic.  Eyes:     Conjunctiva/sclera: Conjunctivae normal.  Cardiovascular:     Rate and Rhythm: Normal rate and regular rhythm.     Heart sounds: No murmur heard.   Pulmonary:     Effort: Pulmonary effort is normal. No respiratory distress.     Breath sounds: Examination of the right-lower field reveals decreased breath sounds. Examination of the left-lower field reveals decreased breath sounds. Decreased breath sounds present. No wheezing, rhonchi or rales.  Abdominal:     General: Bowel sounds are normal.     Palpations: Abdomen is soft.     Tenderness: There is no abdominal tenderness. There is no guarding or rebound.  Musculoskeletal:     Cervical back: Neck supple.     Right lower leg: Edema (trace) present.     Left lower leg: Edema (1+) present.  Skin:    General: Skin is warm and dry.  Neurological:     Mental Status: She is alert.     ED Results / Procedures / Treatments   Labs (all labs ordered are listed, but only abnormal results are displayed) Labs Reviewed  BASIC METABOLIC PANEL - Abnormal; Notable for the following components:      Result Value   Glucose, Bld 124 (*)    Calcium 8.4 (*)    All other components within normal limits  CBC - Abnormal;  Notable for the following components:   WBC 11.7 (*)    RBC 3.58 (*)    Hemoglobin 10.7 (*)    HCT 34.1 (*)    All other components within normal limits  BRAIN NATRIURETIC PEPTIDE  TROPONIN I (HIGH SENSITIVITY)  TROPONIN I (HIGH SENSITIVITY)    EKG None  Radiology DG Chest 2 View  Result Date: 07/20/2019 CLINICAL DATA:  Chest pain EXAM: CHEST - 2 VIEW COMPARISON:  07/12/2019, chest CT 07/13/2019 FINDINGS: Small right pleural effusion and moderate left pleural effusion increased compared to prior. Increased airspace  disease within the left greater than right lung base. Stable cardiomediastinal silhouette. No pneumothorax. Probable chronic mild wedging deformities of mid thoracic vertebra and thoracolumbar vertebra. IMPRESSION: 1. Moderate left and small right bilateral pleural effusions, increased compared to prior. 2. Worsening airspace disease at the left greater than right lung base. Electronically Signed   By: Donavan Foil M.D.   On: 07/20/2019 17:47    Procedures Procedures (including critical care time)  Medications Ordered in ED Medications  sodium chloride flush (NS) 0.9 % injection 3 mL (3 mLs Intravenous Not Given 07/20/19 1654)  HYDROcodone-acetaminophen (NORCO/VICODIN) 5-325 MG per tablet 1 tablet (1 tablet Oral Given 07/20/19 1811)  sodium chloride 0.9 % bolus 500 mL (0 mLs Intravenous Stopped 07/20/19 1913)  iohexol (OMNIPAQUE) 350 MG/ML injection 75 mL (75 mLs Intravenous Contrast Given 07/20/19 1930)    ED Course  I have reviewed the triage vital signs and the nursing notes.  Pertinent labs & imaging results that were available during my care of the patient were reviewed by me and considered in my medical decision making (see chart for details).    MDM Rules/Calculators/A&P                          69 year old female with recently diagnosed with PE presenting for evaluation of worsening shortness of breath and chest pain.  Has been on light 3 L O2 at home.  Also  reports an increase in hemoptysis today.  CBC with mild leukocytosis, mild anemia BMP nonacute Trop negative  EKG with NSR, PACs, low voltage in the precordial leads  CXR with moderate left and small right bilateral pleural effusions, increased compared to prior. Worsening airspace disease at the left greater than right lung base.  At shift change, pt pending CTA chest and BNP. Care signed out to [facility], PA-C with plan to f/u on pending CTA chest and disposition accordingly.   Final Clinical Impression(s) / ED Diagnoses Final diagnoses:  None    Rx / DC Orders ED Discharge Orders    None       Bishop Dublin 07/20/19 1934    Lucrezia Starch, MD 07/20/19 2352

## 2019-07-20 NOTE — ED Notes (Signed)
Admitting at bedside will return to start heparin drip

## 2019-07-20 NOTE — Progress Notes (Signed)
ANTICOAGULATION CONSULT NOTE  Pharmacy Consult for Heparin Indication: pulmonary embolus  No Known Allergies  Patient Measurements: Height: 5\' 4"  (162.6 cm) Weight: 72.1 kg (159 lb) IBW/kg (Calculated) : 54.7 Heparin Dosing Weight: 69.5 kg  Vital Signs: Temp: 98.3 F (36.8 C) (06/29 1640) Temp Source: Oral (06/29 1640) BP: 122/66 (06/29 1915) Pulse Rate: 69 (06/29 1915)  Labs: Recent Labs    07/20/19 1651 07/20/19 1913  HGB 10.7*  --   HCT 34.1*  --   PLT 331  --   CREATININE 0.64  --   TROPONINIHS 7 7    Estimated Creatinine Clearance: 64.6 mL/min (by C-G formula based on SCr of 0.64 mg/dL).   Medical History: Past Medical History:  Diagnosis Date  . Hypertension     Medications:  Scheduled:  . heparin  2,400 Units Intravenous Once  . sodium chloride flush  3 mL Intravenous Once    Assessment: Patient is a 5 yof that presents to the ED with worsening SOB and hypoxia. The patient was dx with a PE ~ 1 week ago and sent home on Apixaban which she reports compliance with. On evaluation the patient's PE has worsened on CT. Pharmacy has been asked to dose heparin at this time for her PE.   Last dose of Apixaban was at 0900 on 6/29 Goal of Therapy:  aPTT 66-102 seconds Monitor platelets by anticoagulation protocol: Yes   Plan:  - No Heparin bolus as on Apixaban PTA last dose was at 0900 on 6/29 - With worsening PE and symptoms will give a half dose bolus of Heparin 2400 units IV x 1 dose - Start heparin drip @ 1150 units/hr  - Will monitor heparin using aPTT until aPTT and Heparin levels correlate - aPTT level in ~ 6 hours  - Monitor patient for s/s of bleeding and CBC while on heparin   Duanne Limerick PharmD. BCPS  07/20/2019,8:42 PM

## 2019-07-20 NOTE — ED Notes (Signed)
Per family at bedside pt was discharge home on 2-3L of oxygen.   Ambulated pt in the hall while wearing 2 L of supplemental oxygen. SpO2 decreased to 88%, RR 27. Pt spo increased to 94% at rest.

## 2019-07-20 NOTE — ED Provider Notes (Addendum)
Care assumed from C. Courture PA-C at shift change pending BNP and CTA results.  See her note for full H&P.   Briefly this is a 69 year old female presenting with shortness of breath.  She has recent PE diagnosis x1 week ago and discharged home on Eliquis which she reports compliance with.  Plan is for likely admission for her dyspnea with worsening pleural effusions seen on chest xray.  Physical Exam  BP (!) 120/56   Pulse 67   Temp 98.3 F (36.8 C) (Oral)   Resp (!) 24   Ht 5\' 4"  (1.626 m)   Wt 72.1 kg   SpO2 100%   BMI 27.29 kg/m   Physical Exam PE: Constitutional: well-developed, well-nourished HENT: normocephalic, atraumatic. no cervical adenopathy Cardiovascular: normal rate and rhythm, distal pulses intact Pulmonary/Chest: Patient is tachypneic.  Lung sounds decreased throughout.  No wheezing, rales or rhonchi heard.  Oxygen saturation is 100% on room air. Abdominal: soft and nontender Musculoskeletal: full ROM.  Bilateral 1+ lower extremity edema. Neurological: alert with goal directed thinking Skin: warm and dry, no rash, no diaphoresis Psychiatric: normal mood and affect, normal behavior   ED Course/Procedures     Results for orders placed or performed during the hospital encounter of 07/20/19 (from the past 24 hour(s))  Basic metabolic panel     Status: Abnormal   Collection Time: 07/20/19  4:51 PM  Result Value Ref Range   Sodium 142 135 - 145 mmol/L   Potassium 4.1 3.5 - 5.1 mmol/L   Chloride 106 98 - 111 mmol/L   CO2 27 22 - 32 mmol/L   Glucose, Bld 124 (H) 70 - 99 mg/dL   BUN 14 8 - 23 mg/dL   Creatinine, Ser 0.64 0.44 - 1.00 mg/dL   Calcium 8.4 (L) 8.9 - 10.3 mg/dL   GFR calc non Af Amer >60 >60 mL/min   GFR calc Af Amer >60 >60 mL/min   Anion gap 9 5 - 15  CBC     Status: Abnormal   Collection Time: 07/20/19  4:51 PM  Result Value Ref Range   WBC 11.7 (H) 4.0 - 10.5 K/uL   RBC 3.58 (L) 3.87 - 5.11 MIL/uL   Hemoglobin 10.7 (L) 12.0 - 15.0 g/dL   HCT  34.1 (L) 36 - 46 %   MCV 95.3 80.0 - 100.0 fL   MCH 29.9 26.0 - 34.0 pg   MCHC 31.4 30.0 - 36.0 g/dL   RDW 13.9 11.5 - 15.5 %   Platelets 331 150 - 400 K/uL   nRBC 0.0 0.0 - 0.2 %  Troponin I (High Sensitivity)     Status: None   Collection Time: 07/20/19  4:51 PM  Result Value Ref Range   Troponin I (High Sensitivity) 7 <18 ng/L  Brain natriuretic peptide     Status: Abnormal   Collection Time: 07/20/19  7:13 PM  Result Value Ref Range   B Natriuretic Peptide 157.7 (H) 0.0 - 100.0 pg/mL   EXAM: CT ANGIOGRAPHY CHEST WITH CONTRAST  TECHNIQUE: Multidetector CT imaging of the chest was performed using the standard protocol during bolus administration of intravenous contrast. Multiplanar CT image reconstructions and MIPs were obtained to evaluate the vascular anatomy.  CONTRAST: 57mL OMNIPAQUE IOHEXOL 350 MG/ML SOLN  COMPARISON: Chest x-ray 07/20/2019, chest CT 07/13/2019  FINDINGS: Cardiovascular: Satisfactory opacification of the pulmonary arteries to the segmental level. Pulmonary emboli are again visualized within the right lower lobar, segmental and subsegmental vessels with overall decreased clot burden  compared to prior. No filling defects within the right middle or upper lobe vessels. Embolus within left lower lobe segmental and subsegmental vessels with more proximal extension of thrombus in the descending left pulmonary artery with small amount of thrombus within the distal left main pulmonary artery. RV LV ratio is non elevated at 0.7. Borderline to mild cardiomegaly. No pericardial effusion. Nonaneurysmal aorta.  Mediastinum/Nodes: Midline trachea. No thyroid mass. No suspicious adenopathy. Esophagus within normal limits.  Lungs/Pleura: Moderate left pleural effusion, increased compared to prior. Worsening consolidation in the left lower lobe. Atelectasis in the right base. No pneumothorax. Emphysematous disease.  Upper Abdomen: No acute  abnormality  Musculoskeletal: Chronic wedging T7 and T12. No acute or suspicious osseous abnormality.  Review of the MIP images confirms the above findings.  IMPRESSION: 1. Acute bilateral pulmonary emboli are again demonstrated. Clot burden on the right appears mildly decreased compared to the prior chest CT, however there is more proximal extension of thrombus on the left with thrombus now visible in the distal left main pulmonary artery and more thrombus in the descending left pulmonary artery. RV LV ratio remains non elevated, no right heart strain by CT measurements. 2. Moderate left pleural effusion, increased compared to prior with worsening consolidation in the left lower lobe which may reflect pneumonia, pulmonary infarct, or combination of the 2.  Critical Value/emergent results were called by telephone at the time of interpretation on 07/20/2019 at 8:27 pm to provider Anda Kraft , who verbally acknowledged these results.  Emphysema (ICD10-J43.9).   Electronically Signed By: Donavan Foil M.D. On: 07/20/2019 20:27  .Critical Care Performed by: Cherre Robins, PA-C Authorized by: Cherre Robins, PA-C   Critical care provider statement:    Critical care time (minutes):  30   Critical care was necessary to treat or prevent imminent or life-threatening deterioration of the following conditions: PE.   Critical care was time spent personally by me on the following activities:  Development of treatment plan with patient or surrogate, discussions with consultants, examination of patient, evaluation of patient's response to treatment, ordering and performing treatments and interventions, ordering and review of laboratory studies, ordering and review of radiographic studies, re-evaluation of patient's condition, review of old charts and pulse oximetry   I assumed direction of critical care for this patient from another provider in my specialty: yes       MDM  Patient  received in sign out at [place]. Please see previous prior note to include MDM at this point.  BNP slightly elevated at 157.7.  Delta troponin is flat at 7.  Received call from radiologist regarding CTA chest.  Imaging shows that the pulmonary emboli on the right side is similar to previous imaging.  However the left-sided emboli has propagated more proximally.  There is no right heart strain.  Heparin ordered.    Spoke with Dr. Koleen Distance with internal medicine service who agrees to assume care of patient and bring into the hospital for further evaluation and management.    Portions of this note were generated with [employer]. Dictation errors may occur despite best attempts at proofreading.      Cherre Robins, PA-C 07/20/19 2036    Loreli Debruler, Harley Hallmark, PA-C 07/20/19 2038    Lucrezia Starch, MD 07/20/19 2352

## 2019-07-20 NOTE — ED Triage Notes (Signed)
Pt BIB [facility] from home c/o 6/10, centralized Chest pain and worsening shortness of breath today around 3 pm. CP worsens with cough.   Recent HX PE with hospital admission discharged 07/16/2019. Pt complaint with Eliquis.  NTG x1 with CP improvement and 324 ASA given by EMS.

## 2019-07-21 ENCOUNTER — Other Ambulatory Visit: Payer: Self-pay

## 2019-07-21 ENCOUNTER — Other Ambulatory Visit: Payer: Self-pay | Admitting: Internal Medicine

## 2019-07-21 ENCOUNTER — Telehealth: Payer: Self-pay | Admitting: Oncology

## 2019-07-21 DIAGNOSIS — I2699 Other pulmonary embolism without acute cor pulmonale: Secondary | ICD-10-CM | POA: Diagnosis not present

## 2019-07-21 DIAGNOSIS — R042 Hemoptysis: Secondary | ICD-10-CM

## 2019-07-21 LAB — HEPARIN LEVEL (UNFRACTIONATED): Heparin Unfractionated: 0.73 IU/mL — ABNORMAL HIGH (ref 0.30–0.70)

## 2019-07-21 LAB — CBC
HCT: 35.6 % — ABNORMAL LOW (ref 36.0–46.0)
Hemoglobin: 11.2 g/dL — ABNORMAL LOW (ref 12.0–15.0)
MCH: 29.9 pg (ref 26.0–34.0)
MCHC: 31.5 g/dL (ref 30.0–36.0)
MCV: 95.2 fL (ref 80.0–100.0)
Platelets: 347 10*3/uL (ref 150–400)
RBC: 3.74 MIL/uL — ABNORMAL LOW (ref 3.87–5.11)
RDW: 13.9 % (ref 11.5–15.5)
WBC: 9.8 10*3/uL (ref 4.0–10.5)
nRBC: 0 % (ref 0.0–0.2)

## 2019-07-21 LAB — BASIC METABOLIC PANEL
Anion gap: 8 (ref 5–15)
BUN: 15 mg/dL (ref 8–23)
CO2: 31 mmol/L (ref 22–32)
Calcium: 8.6 mg/dL — ABNORMAL LOW (ref 8.9–10.3)
Chloride: 104 mmol/L (ref 98–111)
Creatinine, Ser: 0.8 mg/dL (ref 0.44–1.00)
GFR calc Af Amer: >60 mL/min (ref >60)
GFR calc non Af Amer: >60 mL/min (ref >60)
Glucose, Bld: 126 mg/dL — ABNORMAL HIGH (ref 70–99)
Potassium: 4.2 mmol/L (ref 3.5–5.1)
Sodium: 143 mmol/L (ref 135–145)

## 2019-07-21 LAB — APTT: aPTT: 51 seconds — ABNORMAL HIGH (ref 24–36)

## 2019-07-21 LAB — PROCALCITONIN: Procalcitonin: <0.1 ng/mL

## 2019-07-21 MED ORDER — ENOXAPARIN SODIUM 80 MG/0.8ML ~~LOC~~ SOLN
1.0000 mg/kg | Freq: Two times a day (BID) | SUBCUTANEOUS | Status: DC
  Administered 2019-07-21: 75 mg via SUBCUTANEOUS
  Filled 2019-07-21: qty 0.8

## 2019-07-21 MED ORDER — ENOXAPARIN (LOVENOX) PATIENT EDUCATION KIT
PACK | Freq: Once | Status: AC
  Filled 2019-07-21: qty 1

## 2019-07-21 MED ORDER — ENOXAPARIN SODIUM 80 MG/0.8ML ~~LOC~~ SOLN
80.0000 mg | Freq: Two times a day (BID) | SUBCUTANEOUS | 0 refills | Status: DC
Start: 2019-07-21 — End: 2019-08-12

## 2019-07-21 MED ORDER — HEPARIN BOLUS VIA INFUSION
1000.0000 [IU] | Freq: Once | INTRAVENOUS | Status: AC
  Administered 2019-07-21: 1000 [IU] via INTRAVENOUS
  Filled 2019-07-21: qty 1000

## 2019-07-21 MED FILL — ENOXAPARIN SODIUM 80 MG/0.8: 80 | 30 days supply | Qty: 31 | Fill #0

## 2019-07-21 NOTE — Progress Notes (Signed)
ANTICOAGULATION CONSULT NOTE - Follow Up Consult  Pharmacy Consult for heparin Indication: pulmonary embolus  Labs: Recent Labs    07/20/19 1651 07/20/19 1913 07/21/19 0312  HGB 10.7*  --  11.2*  HCT 34.1*  --  35.6*  PLT 331  --  347  APTT  --   --  51*  HEPARINUNFRC  --   --  0.73*  CREATININE 0.64  --  0.80  TROPONINIHS 7 7  --     Assessment: 68yo female subtherapeutic on heparin with initial dosing for worsening PE despite Eliquis treatment; no gtt issues or signs of bleeding per RN.  Goal of Therapy:  aPTT 66-102 seconds   Plan:  Will give small heparin bolus of 1000 units and increase heparin gtt by 3 units/kg/hr to 1300 units/hr and check PTT in 6 hours.    Wynona Neat, PharmD, BCPS  07/21/2019,4:00 AM

## 2019-07-21 NOTE — Discharge Instructions (Signed)
Pulmonary Embolism  A pulmonary embolism (PE) is a sudden blockage or decrease of blood flow in one or both lungs. Most blockages come from a blood clot that forms in the vein of a lower leg, thigh, or arm (deep vein thrombosis, DVT) and travels to the lungs. A clot is blood that has thickened into a gel or solid. PE is a dangerous and life-threatening condition that needs to be treated right away. What are the causes? This condition is usually caused by a blood clot that forms in a vein and moves to the lungs. In rare cases, it may be caused by air, fat, part of a tumor, or other tissue that moves through the veins and into the lungs. What increases the risk? The following factors may make you more likely to develop this condition:  Experiencing a traumatic injury, such as breaking a hip or leg.  Having: ? A spinal cord injury. ? Orthopedic surgery, especially hip or knee replacement. ? Any major surgery. ? A stroke. ? DVT. ? Blood clots or blood clotting disease. ? Long-term (chronic) lung or heart disease. ? Cancer treated with chemotherapy. ? A central venous catheter.  Taking medicines that contain estrogen. These include birth control pills and hormone replacement therapy.  Being: ? Pregnant. ? In the period of time after your baby is delivered (postpartum). ? Older than age 60. ? Overweight. ? A smoker, especially if you have other risks. What are the signs or symptoms? Symptoms of this condition usually start suddenly and include:  Shortness of breath during activity or at rest.  Coughing, coughing up blood, or coughing up blood-tinged mucus.  Chest pain that is often worse with deep breaths.  Rapid or irregular heartbeat.  Feeling light-headed or dizzy.  Fainting.  Feeling anxious.  Fever.  Sweating.  Pain and swelling in a leg. This is a symptom of DVT, which can lead to PE. How is this diagnosed? This condition may be diagnosed based on:  Your medical  history.  A physical exam.  Blood tests.  CT pulmonary angiogram. This test checks blood flow in and around your lungs.  Ventilation-perfusion scan, also called a lung VQ scan. This test measures air flow and blood flow to the lungs.  An ultrasound of the legs. How is this treated? Treatment for this condition depends on many factors, such as the cause of your PE, your risk for bleeding or developing more clots, and other medical conditions you have. Treatment aims to remove, dissolve, or stop blood clots from forming or growing larger. Treatment may include:  Medicines, such as: ? Blood thinning medicines (anticoagulants) to stop clots from forming. ? Medicines that dissolve clots (thrombolytics).  Procedures, such as: ? Using a flexible tube to remove a blood clot (embolectomy) or to deliver medicine to destroy it (catheter-directed thrombolysis). ? Inserting a filter into a large vein that carries blood to the heart (inferior vena cava). This filter (vena cava filter) catches blood clots before they reach the lungs. ? Surgery to remove the clot (surgical embolectomy). This is rare. You may need a combination of immediate, long-term (up to 3 months after diagnosis), and extended (more than 3 months after diagnosis) treatments. Your treatment may continue for several months (maintenance therapy). You and your health care provider will work together to choose the treatment program that is best for you. Follow these instructions at home: Medicines  Take over-the-counter and prescription medicines only as told by your health care provider.  If you   are taking an anticoagulant medicine: ? Take the medicine every day at the same time each day. ? Understand what foods and drugs interact with your medicine. ? Understand the side effects of this medicine, including excessive bruising or bleeding. Ask your health care provider or pharmacist about other side effects. General  instructions  Wear a medical alert bracelet or carry a medical alert card that says you have had a PE and lists what medicines you take.  Ask your health care provider when you may return to your normal activities. Avoid sitting or lying for a long time without moving.  Maintain a healthy weight. Ask your health care provider what weight is healthy for you.  Do not use any products that contain nicotine or tobacco, such as cigarettes, e-cigarettes, and chewing tobacco. If you need help quitting, ask your health care provider.  Talk with your health care provider about any travel plans. It is important to make sure that you are still able to take your medicine while on trips.  Keep all follow-up visits as told by your health care provider. This is important. Contact a health care provider if:  You missed a dose of your blood thinner medicine. Get help right away if:  You have: ? New or increased pain, swelling, warmth, or redness in an arm or leg. ? Numbness or tingling in an arm or leg. ? Shortness of breath during activity or at rest. ? A fever. ? Chest pain. ? A rapid or irregular heartbeat. ? A severe headache. ? Vision changes. ? A serious fall or accident, or you hit your head. ? Stomach (abdominal) pain. ? Blood in your vomit, stool, or urine. ? A cut that will not stop bleeding.  You cough up blood.  You feel light-headed or dizzy.  You cannot move your arms or legs.  You are confused or have memory loss. These symptoms may represent a serious problem that is an emergency. Do not wait to see if the symptoms will go away. Get medical help right away. Call your local emergency services (911 in the [place] Do not drive yourself to the hospital. Summary  A pulmonary embolism (PE) is a sudden blockage or decrease of blood flow in one or both lungs. PE is a dangerous and life-threatening condition that needs to be treated right away.  Treatments for this condition usually  include medicines to thin your blood (anticoagulants) or medicines to break apart blood clots (thrombolytics).  If you are given blood thinners, it is important to take the medicine every day at the same time each day.  Understand what foods and drugs interact with any medicines that you are taking.  If you have signs of PE or DVT, call your local emergency services (911 in the [place] This information is not intended to replace advice given to you by your health care provider. Make sure you discuss any questions you have with your health care provider. Document Revised: 10/15/2017 Document Reviewed: 10/15/2017 Elsevier Patient Education  2020 Elsevier Inc.  

## 2019-07-21 NOTE — TOC Transition Note (Signed)
Transition of Care (TOC) - CM/SW Discharge Note Marvetta Gibbons RN, BSN Transitions of Care Unit 4E- RN Case Manager See Treatment Team for direct phone #     Patient Details  Name: CANTRELL MARTUS MRN: [id number] Date of Birth: 11/27/1950  Transition of Care [facility]) CM/SW Contact:  Dawayne Patricia, RN Phone Number: 07/21/2019, 11:47 AM   Clinical Narrative:    Pt recently admitted 6/21-6/25 and was set up with [facility] on discharge along with home 02 - home 02 was set up with [employer]- and per TOC note [place] with [facility]- Per MD pt also need f/u appointment with Hematology.  Call made to the [facility] for Hematology f/u appointment- spoke with Seth Bake- they note in the system referral was placed in correctly however Seth Bake was able to correct it and make f/u appointment- appointment made with Dr. Alen Blew- for July 20 at 11am.   Call also made to [facility] with [facility] to check on [facility] referral - as patient reports she has not heard from any [facility] agency. [place] to check on referral.  Noted a telephonic encounter note referencing a call from [place] with [facility] about [place] orders - so TOC made a call to [place] with [facility] about [facility] services- [facility] but had to call for orders as no orders were in place at discharge. However at this time [facility] is on a delay for start of care until end of next week.  Spoke again with [place] with [facility] to see if they could do a start of care any sooner- and they can if [facility] added to order - start of care can be done tomorrow 7/1.  Spoke with MD who is agreeable to [facility] for disease management and Lovenox needs- order modified to add RN to [place] will provide [facility] RN/PT needs with start of care for 7/1 - notified T[place] with [facility] of provider change to [facility].    Final next level of care: Home w Home Health Services Barriers to Discharge: No Barriers Identified   Patient Goals and CMS Choice Patient states their  goals for this hospitalization and ongoing recovery are:: return home      Discharge Placement               Home with [facility]        Discharge Plan and Services   Discharge Planning Services: CM Consult, Follow-up appt scheduled Post Acute Care Choice: Home Health, Resumption of Svcs/PTA Provider                    HH Arranged: RN, PT [facility] Agency: [facility] Date [place]: 07/21/19 Time [place]: 1146 Representative spoke with at [place]: [place] Interventions     Readmission Risk Interventions No flowsheet data found.

## 2019-07-21 NOTE — Discharge Summary (Signed)
Name: TANEISHA FUSON MRN: [id number] DOB: 03-21-1950 69 y.o. PCP: Delice Bison, DO  Date of Admission: 07/20/2019  4:35 PM Date of Discharge: 07/21/2019 Attending Physician: Dr. Joni Reining  Discharge Diagnosis: 1. Pulmonary embolism  Discharge Medications: Allergies as of 07/21/2019   No Known Allergies     Medication List    STOP taking these medications   apixaban 5 MG Tabs tablet Commonly known as: ELIQUIS   furosemide 20 MG tablet Commonly known as: LASIX   omeprazole 20 MG capsule Commonly known as: PRILOSEC     TAKE these medications   acetaminophen 500 MG tablet Commonly known as: TYLENOL Take 1,000 mg by mouth as needed for moderate pain.   enoxaparin 80 MG/0.8ML injection Commonly known as: LOVENOX Inject 0.8 mLs (80 mg total) into the skin every 12 (twelve) hours.   lisinopril 20 MG tablet Commonly known as: ZESTRIL TAKE 1 TABLET BY MOUTH EVERY DAY Notes to patient: Take as you were at home.   metoprolol tartrate 50 MG tablet Commonly known as: LOPRESSOR Take 1 tablet (50 mg total) by mouth 2 (two) times daily.       Disposition and follow-up:   Ms.Brettney L Maule was discharged from [facility] in Stable condition.  At the hospital follow up visit please address:  1.  Acute PE after treatment failure while on eliquis. Pt recently hospitalized 6/21-6/25 for bilateral PE's presumably unprovoked, and was discharged on eliquis. Returned to [facility] on 07/21/19 for worsening dyspnea and pleuritic chest pain. CTA showed propogation of her prior PE. Pt had noted FH of a sister with antiphospholipid syndrome. 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 --Medications discontinued: eliquis --Medication added: BID lovenox at discharge --F/u in warfarin clinic 7/1 to discuss bridge to warfarin --F/u with PCP 07/29/19 arranged prior to discharge --obtain antiphospholipid antibody  testing at time of follow up (OK to do during anticoagulation) --Recommend referral to hematology for further evaluation --Recommend age appropriate cancer screening  2. Hemopytis. Suspect 2/2 PE. No masses noted on imaging report.  --continue to monitor. If hemoptysis persists, consider pulm referral for bronchoscopy  2.  Labs / imaging needed at time of follow-up: antiphospholipid antibody  3.  Pending labs/ test needing follow-up: none  Follow-up Appointments:  [place] [place] [facility] up.   Specialty: [place] Why: HHPT/OT ordered on last admit- they will f/u on referral and call for start of care (look out for a phone # calling that starts with [phone]) Contact information: [address] [place] [address] [phone]        Wyatt Portela, MD. Go on 08/10/2019.   Specialty: Oncology Why: appointment made for hematology follow up-  at 11:00 am Contact information: [place] [address] [phone]        Pennie Banter, RPH-CPP .   Specialty: Pharmacist Contact information: [place] [address] [phone]               Hospital Course: 76 yof recently admitted 6/21-6/25 for unprovoked bilateral PE's and was discharged with eliquis. Pt returned to ED 07/21/19 for progressive shortness of breath, chest pain, and unilateral leg pain. CTA in the ED showed worsening of her PE. She reported compliance with her eliquis regimen and denied any missed doses. She reports a sister with antiphospholipid  antibody syndrome. She was discharged on 3L supplemental O2 on last discharge. No increase in O2 require during hospitalization.  She was transitioned to BID lovenox and was medically stable at time of discharge.  Discharge Vitals:   BP (!) 141/85 (BP Location: Right Arm)   Pulse 75   Temp 98.1 F (36.7 C) (Oral)   Resp 20   Ht 5\' 4"  (1.626 m)   Wt 74.3 kg    SpO2 93%   BMI 28.12 kg/m   Pertinent Labs, Studies, and Procedures:  6/29 CTA chest>>acute bilateral pulmonary emboli again demonstrated. Clot burden on the right mildly decreased compared to prior however left clot has distended proximally and is now visible in the distal left main pulm artery and more thrombus in the descending left pulm artery. RV/LV ratio remains non-elevated, no right heart strain. Moderate left pleural effusion increased from prior with worsening consolidation in the LLL which may reflect pna, pulm infarct or both   Signed: Mitzi Hansen, MD 07/23/2019, 3:05 PM   Pager: [phone]

## 2019-07-21 NOTE — Telephone Encounter (Signed)
A new hem appt has been scheduled for Kimberly Walters to see Dr. Alen Blew on 7/20 at 11am.

## 2019-07-21 NOTE — Progress Notes (Signed)
Discharge instructions given to patient. IV removed, clean and intact. Medications reviewed, all questions answered. Pt escorted home with daughter.  Essa Malachi R Jentri Aye, RN  

## 2019-07-21 NOTE — Progress Notes (Signed)
NAME:  Kimberly Walters, MRN:  [id number], DOB:  1950/12/15, LOS: 0 ADMISSION DATE:  07/20/2019   Brief History  39 yof recently admitted 6/21-6/25 for unprovoked bilateral PE's and was discharged with eliquis. Pt returned to ED 07/21/19 for progressive shortness of breath, chest pain, and unilateral leg pain. Workup in the ED showed worsening of her PE.  Subjective  No overnight events.   Patient evaluated at bedside this morning. Daughter is at bedside. She is resting comfortably in bed at this time on 3L O2 via [place]. Discussed failure of Eliquis and recommendation for Lovenox with transition to warfarin. Patient will also need to follow up with hematologist. Patient and daughter express understanding.  Daughter also inquired about the pleural effusion noted on imaging. We discussed that, since she is not displaying really significant sx from this, we would not typically do a thoracentesis, especially in the setting of being anticoagulated.   After rounding on pt this morning, I did reach out to pharmacy to discuss the lovenox and education to go with it. They are noting that lovenox tends to be fairly expensive so they will be checking cost to discuss with pt and daughter. They relayed that they are unsure if this will be able to be completed today.   The daughter had also noted that she had not received a call from hematology, afib clinic or [facility] which were all referred on prior discharge. I also spoke with SW and case management regarding ensuring pt has appts with Afib clinic and hematology prior to discharge. They agree to follow these issues up prior to discharge.   Significant Hospital Events   6/29> admitted   Objective   Blood pressure (!) 142/74, pulse 73, temperature 98.4 F (36.9 C), temperature source Oral, resp. rate (!) 24, height 5\' 4"  (1.626 m), weight 74.3 kg, SpO2 97 %.     Intake/Output Summary (Last 24 hours) at 07/21/2019 0743 Last data filed at 07/21/2019 0404 Gross per 24  hour  Intake 589.82 ml  Output --  Net 589.82 ml   Filed Weights   07/20/19 1640 07/20/19 2209  Weight: 72.1 kg 74.3 kg    Examination: General: resting comfortably in bed CV: RRR, no LE edema Pulm: breathing comfortably on 3L [place], diminished sounds at the left base  Significant Diagnostic Tests:  6/29 CTA chest>>acute bilateral pulmonary emboli again demonstrated. Clot burden on the right mildly decreased compared to prior however left clot has distended proximally and is now visible in the distal left main pulm artery and more thrombus in the descending left pulm artery. RV/LV ratio remains non-elevated, no right heart strain. Moderate left pleural effusion increased from prior with worsening consolidation in the LLL which may reflect pna, pulm infarct or both   Labs    CBC Latest Ref Rng & Units 07/21/2019 07/20/2019 07/16/2019  WBC 4.0 - 10.5 K/uL 9.8 11.7(H) 13.4(H)  Hemoglobin 12.0 - 15.0 g/dL 11.2(L) 10.7(L) 11.8(L)  Hematocrit 36 - 46 % 35.6(L) 34.1(L) 36.2  Platelets 150 - 400 K/uL 347 331 186   BMP Latest Ref Rng & Units 07/21/2019 07/20/2019 07/14/2019  Glucose 70 - 99 mg/dL 126(H) 124(H) 169(H)  BUN 8 - 23 mg/dL 15 14 14   Creatinine 0.44 - 1.00 mg/dL 0.80 0.64 0.93  BUN/Creat Ratio 12 - 28 - - -  Sodium 135 - 145 mmol/L 143 142 136  Potassium 3.5 - 5.1 mmol/L 4.2 4.1 3.8  Chloride 98 - 111 mmol/L 104 106 101  CO2 22 - 32 mmol/L 31 27 22   Calcium 8.9 - 10.3 mg/dL 8.6(L) 8.4(L) 8.2(L)    Summary  75 yof admitted to [facility] for worsening of her PE while on eliquis. I would consider this a failure of eliquis and, especially in the setting of her sister having antiphospholipid syndrome, she will require alternative therapy. No diagnostic findings consistent with right heart strain and she remains on her prior discharge O2 requirement of 3L. We are transitioning her off heparin today and starting BID lovenox dosing. She is medically stable for discharge today pending pharmacy  education and arranging outpatient follow up with [facility], hematology, and afib clinic.  Assessment & Plan:  Active Problems:   Pulmonary embolism (HCC)  Acute worsening of bilateral pulmonary embolisms in the setting of suspected drug failure. No evidence of right heart strain diagnostically and stable respiratory status from prior discharge.  Pt reiterated that she was taking her eliquis as instructed and had not missed any dosing. Difficult to discern for certain if this was a drug failure however would highly consider it in the setting of her FH significance of a sister with APS. Ideally, we would transition her to the BID lovenox dosing and have her follow up with hematology. Unfortunately, pharmacy is noting her insurance will only cover for 2 weeks of the BID dosing. Warfarin is also indicated in her situation however would like to hold off to allow hematology to obtain some of the testing that would otherwise have to be delayed while on warfarin. Plan: Will transition her to BID lovenox dosing. She already has an appt in our clinic on Friday. Will arrange her to see Dr. Elie Confer in clinic to start warfarin bridge since earliest hematology appt is 7/20.  Warfarin clinic 7/1 at [facility] July 8 10:45  Atrial fibrillation. Now in SR. Rate controlled on metoprolol. Anticoagulated with lovenox BID as above.  F/u in afib clinic at discharge.   Best practice:  CODE STATUS: Full Diet: cardiac DVT for prophylaxis: lovenox BID Social considerations/Family communication: see above Dispo: d/c today   Mitzi Hansen, MD [place] PGY-1 Contact info:  Please page me at [phone] from 7am-5pm M-F.  Please use the [facility] after hours pager at [phone] after 5pm M-F and on weekends 07/21/19  7:43 AM

## 2019-07-21 NOTE — Patient Outreach (Signed)
[facility]) Care Management  07/21/2019  DANNIKA HILGEMAN 04/21/1950 [id number]  Incoming call received by patients daughter Alric Ran). Daughter states patient is currently readmitted and would like automated follow up calls to be discontinued.  CMA has sent notification to [place] for calls to be deactivated.  Ina Homes [facility] Management Assistant [phone]

## 2019-07-22 ENCOUNTER — Ambulatory Visit: Payer: Medicare HMO | Admitting: Pharmacist

## 2019-07-22 VITALS — BP 123/59 | HR 66

## 2019-07-22 DIAGNOSIS — I2609 Other pulmonary embolism with acute cor pulmonale: Secondary | ICD-10-CM

## 2019-07-22 DIAGNOSIS — I2699 Other pulmonary embolism without acute cor pulmonale: Secondary | ICD-10-CM

## 2019-07-22 DIAGNOSIS — D6859 Other primary thrombophilia: Secondary | ICD-10-CM | POA: Insufficient documentation

## 2019-07-22 DIAGNOSIS — Z7901 Long term (current) use of anticoagulants: Secondary | ICD-10-CM | POA: Insufficient documentation

## 2019-07-22 NOTE — Patient Instructions (Signed)
Continue 80mg  of Lovenox, every 12 hours. Administer 2 inches away from your "belly-button", at the level of the waistband, alternating sites (Right-Left-Right, etc.) Continue this until last dose on 14-July-21. On 15-July-21, you will commence Lovenox 120mg  subcutaneously every twenty-four (24) hours. You will continue this until seen by the Hematologist on August 10, 2019. After any blood work that the Hematologist wishes to collect at that visit, and with their approval, I will commence warfarin/Coumadin, which will be overlapped with the Lovenox, for MINIMALLY five (5) days. Call [phone] with any questions you may have before your next scheduled visits with Dr. Koleen Distance on 8-JUL-21 or with the Hematologist on 20-JUL-21.

## 2019-07-22 NOTE — Addendum Note (Signed)
Addended by: Jorene Guest B on: 07/22/2019 11:36 AM   Modules accepted: Level of Service

## 2019-07-22 NOTE — Progress Notes (Signed)
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 Re-admitted 29-JUN-21 with pulmonary infarction/pulmonary embolism. Have reviewed the physical exam findings as well as the HPI and discharge notes. Spoke with members of the [facility] B2/Lane Service yesterday to discuss options for LMWH. We discussed a 2017 Thrombosis and Haemostasis Paper, a meta-analysis, examining the efficacy and safety of once-daily LMWH (1.5mg /kg sq q24h). Findings of this paper were favorable with respect to efficacy, safety and mortality. Case manager reported to the team that patient's insurance would only permit 14 days of LMWH at 1mg /kg SQ q12h. Decision by team, the patient with shared decision making was to use 1mg /kg SQ q12h x 14 days (30-JUN-21 to 14-JUL-21), on day 15--in order to have continued LMWH insurance coverage, the patient will be started on 1.5mg /kg SQ q24h (patient weight today is 76kg, receiving 80mg  SQ q12h). When the patient is transitioned to enoxaparin 1.5mg /kg SQ q24h, dose will be 114kg---no commercially available syringe strength. Given she had a breakthrough embolic event, I would suggest rounding UP to the 120mg  commercially available syringe strength and will commence this on 15-JUL-21 until seen by the Hematologist. Once any hypercoagulable work-up is performed at that visit--NOT on warfarin, we will proceed with the requisite minimum overlap of LMWH + warfarin of FIVE (5) days MINIMUM. INR will be evaluated after five days to determine if the INR is therapeutic (target 2.0 - 3.0--unless otherwise selected by hematologist to be higher.) We/I can see the patient in the [facility] for continued oversight of her warfarin therapy. A total of 45 minutes were allocated for patient disease state  and drug therapy patient education. My phone number has been provided to the patient and her daughter. She was apprised of symptoms/signs that could indicate bleeding as well as recurrence of VTE. She was given an opportunity to ask, and have questions answered.   Jorene Guest, PharmD, CPP

## 2019-07-23 ENCOUNTER — Encounter: Payer: Medicare HMO | Admitting: Internal Medicine

## 2019-07-23 DIAGNOSIS — I2699 Other pulmonary embolism without acute cor pulmonale: Secondary | ICD-10-CM | POA: Diagnosis not present

## 2019-07-23 DIAGNOSIS — I10 Essential (primary) hypertension: Secondary | ICD-10-CM | POA: Diagnosis not present

## 2019-07-23 DIAGNOSIS — Z9181 History of falling: Secondary | ICD-10-CM | POA: Diagnosis not present

## 2019-07-23 DIAGNOSIS — Z9981 Dependence on supplemental oxygen: Secondary | ICD-10-CM | POA: Diagnosis not present

## 2019-07-23 DIAGNOSIS — F17210 Nicotine dependence, cigarettes, uncomplicated: Secondary | ICD-10-CM | POA: Diagnosis not present

## 2019-07-23 DIAGNOSIS — I480 Paroxysmal atrial fibrillation: Secondary | ICD-10-CM | POA: Diagnosis not present

## 2019-07-23 DIAGNOSIS — Z7901 Long term (current) use of anticoagulants: Secondary | ICD-10-CM | POA: Diagnosis not present

## 2019-07-27 ENCOUNTER — Emergency Department (HOSPITAL_COMMUNITY)
Admission: EM | Admit: 2019-07-27 | Discharge: 2019-07-28 | Disposition: A | Discharge status: Home / Self Care | Payer: Medicare HMO | Source: Home / Self Care | Attending: Emergency Medicine | Admitting: Emergency Medicine

## 2019-07-27 ENCOUNTER — Telehealth: Payer: Self-pay | Admitting: Internal Medicine

## 2019-07-27 ENCOUNTER — Ambulatory Visit (INDEPENDENT_AMBULATORY_CARE_PROVIDER_SITE_OTHER): Payer: Medicare HMO | Admitting: Internal Medicine

## 2019-07-27 ENCOUNTER — Encounter (HOSPITAL_COMMUNITY): Payer: Self-pay

## 2019-07-27 ENCOUNTER — Encounter: Payer: Self-pay | Admitting: Internal Medicine

## 2019-07-27 VITALS — BP 136/70 | HR 71 | Temp 98.2°F | Ht 64.0 in | Wt 160.2 lb

## 2019-07-27 DIAGNOSIS — D649 Anemia, unspecified: Secondary | ICD-10-CM | POA: Diagnosis not present

## 2019-07-27 DIAGNOSIS — N200 Calculus of kidney: Secondary | ICD-10-CM | POA: Diagnosis not present

## 2019-07-27 DIAGNOSIS — R6883 Chills (without fever): Secondary | ICD-10-CM | POA: Diagnosis not present

## 2019-07-27 DIAGNOSIS — R319 Hematuria, unspecified: Secondary | ICD-10-CM | POA: Diagnosis not present

## 2019-07-27 DIAGNOSIS — I700 Atherosclerosis of aorta: Secondary | ICD-10-CM | POA: Diagnosis not present

## 2019-07-27 DIAGNOSIS — J90 Pleural effusion, not elsewhere classified: Secondary | ICD-10-CM | POA: Diagnosis not present

## 2019-07-27 DIAGNOSIS — R1011 Right upper quadrant pain: Secondary | ICD-10-CM | POA: Diagnosis not present

## 2019-07-27 DIAGNOSIS — D3500 Benign neoplasm of unspecified adrenal gland: Secondary | ICD-10-CM | POA: Diagnosis not present

## 2019-07-27 DIAGNOSIS — Z79899 Other long term (current) drug therapy: Secondary | ICD-10-CM | POA: Insufficient documentation

## 2019-07-27 DIAGNOSIS — Z833 Family history of diabetes mellitus: Secondary | ICD-10-CM | POA: Diagnosis not present

## 2019-07-27 DIAGNOSIS — N1330 Unspecified hydronephrosis: Secondary | ICD-10-CM | POA: Diagnosis not present

## 2019-07-27 DIAGNOSIS — D72829 Elevated white blood cell count, unspecified: Secondary | ICD-10-CM | POA: Diagnosis not present

## 2019-07-27 DIAGNOSIS — R52 Pain, unspecified: Secondary | ICD-10-CM | POA: Diagnosis not present

## 2019-07-27 DIAGNOSIS — Z9981 Dependence on supplemental oxygen: Secondary | ICD-10-CM | POA: Diagnosis not present

## 2019-07-27 DIAGNOSIS — R8289 Other abnormal findings on cytological and histological examination of urine: Secondary | ICD-10-CM | POA: Diagnosis not present

## 2019-07-27 DIAGNOSIS — F17210 Nicotine dependence, cigarettes, uncomplicated: Secondary | ICD-10-CM | POA: Diagnosis not present

## 2019-07-27 DIAGNOSIS — J439 Emphysema, unspecified: Secondary | ICD-10-CM | POA: Diagnosis present

## 2019-07-27 DIAGNOSIS — T457X1A Poisoning by anticoagulant antagonists, vitamin K and other coagulants, accidental (unintentional), initial encounter: Secondary | ICD-10-CM | POA: Diagnosis not present

## 2019-07-27 DIAGNOSIS — R0902 Hypoxemia: Secondary | ICD-10-CM | POA: Diagnosis not present

## 2019-07-27 DIAGNOSIS — Z8249 Family history of ischemic heart disease and other diseases of the circulatory system: Secondary | ICD-10-CM | POA: Diagnosis not present

## 2019-07-27 DIAGNOSIS — Z87891 Personal history of nicotine dependence: Secondary | ICD-10-CM | POA: Insufficient documentation

## 2019-07-27 DIAGNOSIS — I480 Paroxysmal atrial fibrillation: Secondary | ICD-10-CM | POA: Diagnosis not present

## 2019-07-27 DIAGNOSIS — R6510 Systemic inflammatory response syndrome (SIRS) of non-infectious origin without acute organ dysfunction: Secondary | ICD-10-CM | POA: Diagnosis not present

## 2019-07-27 DIAGNOSIS — I2699 Other pulmonary embolism without acute cor pulmonale: Secondary | ICD-10-CM

## 2019-07-27 DIAGNOSIS — N134 Hydroureter: Secondary | ICD-10-CM | POA: Diagnosis not present

## 2019-07-27 DIAGNOSIS — B951 Streptococcus, group B, as the cause of diseases classified elsewhere: Secondary | ICD-10-CM | POA: Diagnosis present

## 2019-07-27 DIAGNOSIS — N179 Acute kidney failure, unspecified: Secondary | ICD-10-CM | POA: Diagnosis not present

## 2019-07-27 DIAGNOSIS — N136 Pyonephrosis: Secondary | ICD-10-CM | POA: Diagnosis not present

## 2019-07-27 DIAGNOSIS — K6389 Other specified diseases of intestine: Secondary | ICD-10-CM | POA: Diagnosis not present

## 2019-07-27 DIAGNOSIS — Z791 Long term (current) use of non-steroidal anti-inflammatories (NSAID): Secondary | ICD-10-CM | POA: Diagnosis not present

## 2019-07-27 DIAGNOSIS — Z86711 Personal history of pulmonary embolism: Secondary | ICD-10-CM | POA: Diagnosis not present

## 2019-07-27 DIAGNOSIS — Z9181 History of falling: Secondary | ICD-10-CM | POA: Diagnosis not present

## 2019-07-27 DIAGNOSIS — D689 Coagulation defect, unspecified: Secondary | ICD-10-CM

## 2019-07-27 DIAGNOSIS — N23 Unspecified renal colic: Secondary | ICD-10-CM | POA: Diagnosis not present

## 2019-07-27 DIAGNOSIS — R310 Gross hematuria: Secondary | ICD-10-CM | POA: Insufficient documentation

## 2019-07-27 DIAGNOSIS — Z20822 Contact with and (suspected) exposure to covid-19: Secondary | ICD-10-CM | POA: Diagnosis not present

## 2019-07-27 DIAGNOSIS — Z7901 Long term (current) use of anticoagulants: Secondary | ICD-10-CM | POA: Diagnosis not present

## 2019-07-27 DIAGNOSIS — N12 Tubulo-interstitial nephritis, not specified as acute or chronic: Secondary | ICD-10-CM | POA: Diagnosis not present

## 2019-07-27 DIAGNOSIS — I10 Essential (primary) hypertension: Secondary | ICD-10-CM | POA: Insufficient documentation

## 2019-07-27 DIAGNOSIS — K429 Umbilical hernia without obstruction or gangrene: Secondary | ICD-10-CM | POA: Diagnosis not present

## 2019-07-27 LAB — CBC
HCT: 37 % (ref 36.0–46.0)
HCT: 35.6 % — ABNORMAL LOW (ref 36.0–46.0)
Hemoglobin: 11.3 g/dL — ABNORMAL LOW (ref 12.0–15.0)
Hemoglobin: 11 g/dL — ABNORMAL LOW (ref 12.0–15.0)
MCH: 29.1 pg (ref 26.0–34.0)
MCH: 29.5 pg (ref 26.0–34.0)
MCHC: 30.5 g/dL (ref 30.0–36.0)
MCHC: 30.9 g/dL (ref 30.0–36.0)
MCV: 95.4 fL (ref 80.0–100.0)
MCV: 95.4 fL (ref 80.0–100.0)
Platelets: 483 10*3/uL — ABNORMAL HIGH (ref 150–400)
Platelets: 463 10*3/uL — ABNORMAL HIGH (ref 150–400)
RBC: 3.88 MIL/uL (ref 3.87–5.11)
RBC: 3.73 MIL/uL — ABNORMAL LOW (ref 3.87–5.11)
RDW: 13.4 % (ref 11.5–15.5)
RDW: 13.3 % (ref 11.5–15.5)
WBC: 10.9 10*3/uL — ABNORMAL HIGH (ref 4.0–10.5)
WBC: 11.9 10*3/uL — ABNORMAL HIGH (ref 4.0–10.5)
nRBC: 0 % (ref 0.0–0.2)
nRBC: 0 % (ref 0.0–0.2)

## 2019-07-27 LAB — COMPREHENSIVE METABOLIC PANEL
ALT: 55 U/L — ABNORMAL HIGH (ref 0–44)
AST: 33 U/L (ref 15–41)
Albumin: 2.4 g/dL — ABNORMAL LOW (ref 3.5–5.0)
Alkaline Phosphatase: 74 U/L (ref 38–126)
Anion gap: 9 (ref 5–15)
BUN: 21 mg/dL (ref 8–23)
CO2: 26 mmol/L (ref 22–32)
Calcium: 8.6 mg/dL — ABNORMAL LOW (ref 8.9–10.3)
Chloride: 104 mmol/L (ref 98–111)
Creatinine, Ser: 0.81 mg/dL (ref 0.44–1.00)
GFR calc Af Amer: >60 mL/min (ref >60)
GFR calc non Af Amer: >60 mL/min (ref >60)
Glucose, Bld: 186 mg/dL — ABNORMAL HIGH (ref 70–99)
Potassium: 3.7 mmol/L (ref 3.5–5.1)
Sodium: 139 mmol/L (ref 135–145)
Total Bilirubin: 0.3 mg/dL (ref 0.3–1.2)
Total Protein: 5.6 g/dL — ABNORMAL LOW (ref 6.5–8.1)

## 2019-07-27 LAB — HEPARIN ANTI-XA: Heparin LMW: 0.97 IU/mL

## 2019-07-27 LAB — TYPE AND SCREEN
ABO/RH(D): A POS
Antibody Screen: NEGATIVE

## 2019-07-27 LAB — ABO/RH: ABO/RH(D): A POS

## 2019-07-27 LAB — POCT INR: INR: 1.1 — AB (ref 2.0–3.0)

## 2019-07-27 NOTE — Telephone Encounter (Signed)
Pt s daughter is bringing to clinic, she states pt was fussing about not wanting to go to ED anyway

## 2019-07-27 NOTE — Telephone Encounter (Signed)
Pt reporting a lot  Bright Red Blood in the stool today.  Pt is concerned and would like for a nurse to call back.

## 2019-07-27 NOTE — Telephone Encounter (Signed)
Pt reports spitting up blood while in hospital and continues, states she reported to the doctor while inpt, she is now having blood come out of rectal area and when she voids, states she is having a little when she is not using the bathroom, nurse had her check her underwear while on phone and she stated she has a little coming out on its own. She denies chest pain, short of breath, h/a, weakness, dizziness. She has someone present with her. appt in clinic? Or ED? Sending to attending and blue team Per charsetta we can do a 1545 today, please advise

## 2019-07-27 NOTE — ED Triage Notes (Signed)
Pt arrives to ED w/ c/o vaginal bleeding. Pt also reports coughing up blood. Pt recently admitted for PE and discharged w/ known PE on a blood thinner.

## 2019-07-27 NOTE — Progress Notes (Signed)
   CC: vaginal bleeding   HPI:  [name] is a 69 y.o. female with recent pulmonary embolisms on Lovenox therapy presenting for evaluation of painless rectal/vaginal bleeding. She notes that she was in her usual state of health until earlier today when she was using the restroom. She noted to have bright red blood in the toilet bowl and is unsure whether it was associated with her bowel movement or urine. She notes urinating following that and noticing only a small amount of blood with urinating. She denies any other symptoms. Please see problem based charting for complete assessment and plan.  Past Medical History:  Diagnosis Date  . Hypertension    Review of Systems:  Negative except as stated in HPI.  Physical Exam:  Vitals:   07/27/19 1549  BP: 136/70  Pulse: 71  Temp: 98.2 F (36.8 C)  TempSrc: Oral  SpO2: 99%  Weight: 160 lb 3.2 oz (72.7 kg)  Height: 5\' 4"  (1.626 m)   Physical Exam  Constitutional: Appears well-developed and well-nourished. No distress.  HENT: Normocephalic and atraumatic, EOMI, conjunctiva normal, moist mucous membranes Cardiovascular: Normal rate, regular rhythm, S1 and S2 present, no murmurs, rubs, gallops.  Distal pulses intact Respiratory: No respiratory distress, no accessory muscle use.  Effort is normal.  Lungs are clear to auscultation bilaterally. GI: Nondistended, soft, nontender to palpation, normal active bowel sounds Musculoskeletal: Normal bulk and tone.  No peripheral edema noted. Neurological: Is alert and oriented x4, no apparent focal deficits noted. Skin: Warm and dry.  No rash, erythema, lesions noted. Genitourinary: labia majora and minora without lesions noted; no adnexal tenderness; no obvious source of bleeding identified Rectal: Normal tone; no external or internal hemorrhoids noted on rectal exam; brown stool in rectal vault without bleeding  Psychiatric: Normal mood and affect. Behavior is normal. Judgment and thought  content normal.   Assessment & Plan:   See Encounters Tab for problem based charting.  Patient discussed with Dr. Evette Doffing

## 2019-07-27 NOTE — Patient Instructions (Addendum)
Kimberly Walters,  It was a pleasure seeing you in clinic. Today we discussed:   Bleeding:  Thank you for coming to the clinic today for this. Your examination is reassuring; however, I will check some labs and inform you of the results as soon as possible.   If you have any questions or concerns, please call our clinic at [phone] between 9am-5pm and after hours call [phone] and ask for the internal medicine resident on call. If you feel you are having a medical emergency please call 911.   Thank you, we look forward to helping you remain healthy!

## 2019-07-27 NOTE — Telephone Encounter (Signed)
I called Kimberly Walters and talked to her and her daughter after got page on after hour on call pager. She is on Lovenox BID at home due to recent PE. Reports continuous hematuria (also vaginal bleeding?) since this afternoon. She went to bathroom and urinated significant amount of blood, 3 times in past 2 hours. Reports pressure on her abdomen when goes to bathroom. Denies dizziness or headache.   The daughter mentions that pt' SBP is 140s and HR at 70s. She was seen in [facility] today but her symptoms got significantly worse. CBC checked today and showed Hb 11.3 (was 11.2, last week  And 12.6 12 days ago).  -Instructed to hold Lovenox tonight and come to ED for further management if continues to bleed whithin next

## 2019-07-27 NOTE — Telephone Encounter (Signed)
Ok. Im sure we can work her in.

## 2019-07-28 ENCOUNTER — Other Ambulatory Visit: Payer: Self-pay

## 2019-07-28 DIAGNOSIS — Z9981 Dependence on supplemental oxygen: Secondary | ICD-10-CM | POA: Diagnosis not present

## 2019-07-28 DIAGNOSIS — I10 Essential (primary) hypertension: Secondary | ICD-10-CM | POA: Diagnosis not present

## 2019-07-28 DIAGNOSIS — Z9181 History of falling: Secondary | ICD-10-CM | POA: Diagnosis not present

## 2019-07-28 DIAGNOSIS — D689 Coagulation defect, unspecified: Secondary | ICD-10-CM | POA: Insufficient documentation

## 2019-07-28 DIAGNOSIS — I480 Paroxysmal atrial fibrillation: Secondary | ICD-10-CM | POA: Diagnosis not present

## 2019-07-28 DIAGNOSIS — Z7901 Long term (current) use of anticoagulants: Secondary | ICD-10-CM | POA: Diagnosis not present

## 2019-07-28 DIAGNOSIS — T457X1A Poisoning by anticoagulant antagonists, vitamin K and other coagulants, accidental (unintentional), initial encounter: Secondary | ICD-10-CM | POA: Insufficient documentation

## 2019-07-28 DIAGNOSIS — I2699 Other pulmonary embolism without acute cor pulmonale: Secondary | ICD-10-CM | POA: Diagnosis not present

## 2019-07-28 DIAGNOSIS — F17210 Nicotine dependence, cigarettes, uncomplicated: Secondary | ICD-10-CM | POA: Diagnosis not present

## 2019-07-28 LAB — URINALYSIS, ROUTINE W REFLEX MICROSCOPIC

## 2019-07-28 LAB — URINALYSIS, MICROSCOPIC (REFLEX): RBC / HPF: >50 RBC/hpf (ref 0–5)

## 2019-07-28 MED ORDER — CEPHALEXIN 500 MG PO CAPS
500.0000 mg | ORAL_CAPSULE | Freq: Three times a day (TID) | ORAL | 0 refills | Status: DC
Start: 2019-07-28 — End: 2019-08-05

## 2019-07-28 MED ORDER — LIDOCAINE HCL (PF) 1 % IJ SOLN
INTRAMUSCULAR | Status: AC
  Filled 2019-07-28: qty 5

## 2019-07-28 MED ORDER — STERILE WATER FOR INJECTION IJ SOLN
INTRAMUSCULAR | Status: AC
  Administered 2019-07-28: 2.1 mL
  Filled 2019-07-28: qty 10

## 2019-07-28 MED ORDER — CEFTRIAXONE SODIUM 1 G IJ SOLR
1.0000 g | Freq: Once | INTRAMUSCULAR | Status: AC
  Administered 2019-07-28: 1 g via INTRAMUSCULAR
  Filled 2019-07-28: qty 10

## 2019-07-28 NOTE — Addendum Note (Signed)
Addended by: Lalla Brothers T on: 07/28/2019 04:47 PM   Modules accepted: Level of Service

## 2019-07-28 NOTE — Discharge Instructions (Signed)
You were seen today for grossly bloody urine.  Often times this is related to minor trauma or urinary tract infection.  You will be treated for urinary tract infection.  Continue your Lovenox as directed.  If you develop dizziness, weakness, any new or worsening symptoms you should be reevaluated.

## 2019-07-28 NOTE — Assessment & Plan Note (Signed)
Ms. Diamonds Lippard presents with concerns of an episode of bleeding while using the restroom. Patient endorses seeing bright red blood in her toilet earlier today and is unsure whether it is associated with bowel movement or with urination. She notes second episode when trying to urinate and noticing blood when wiping; however, not as significant as the first episode. On examination, no source of bleeding identified on rectal or vaginal exam. Discussed with Dr. Elie Confer, and recommended to check for anti-Xa heparin levels.  CBC with stable hemoglobin levels, Heparin Xa levels within therapeutic range and INR subtherapeutic. Followed up with patient following her clinic visit via telephone to discuss lab results. Given risk vs benefit of anticoagulation given her recurrent PE's and therapeutic levels of heparin Xa, recommended for continuing Lovenox at this time. Patient advised that if bleeding significantly worsens, to seek further care. She expressed understanding.  Plan: Patient to f/u with PCP on 7/8 Patient to f/u with hematologist on 7/20

## 2019-07-28 NOTE — ED Provider Notes (Signed)
[facility] EMERGENCY DEPARTMENT Provider Note   CSN: [id number] Arrival date & time: 07/27/19  2215     History Chief Complaint  Patient presents with  . Shortness of Breath    Kimberly Walters is a 69 y.o. female.  HPI     This is a 69 year old female with a history of pulmonary embolism on Lovenox, atrial fibrillation, hypertension who presents with hematuria.  Patient reports that today she developed gross blood and clot in her urine.  She initially described "bleeding from her vagina."  However she states that the bleeding only occurs when she urinates.  She was seen and evaluated internal medicine clinic today had normal hemoglobin and was told to watch carefully.  However, she states that she has had ongoing bleeding that has been "significant."  She describes having a negative rectal and vaginal exam in clinic although clinic notes do not reflect this.  Denies any dizziness.    Reports frequency but no dysuria.  Denies chest pain.  Reports decreased oxygen requirement since discharge from her recent PE diagnosis.  She missed her evening dose of Lovenox.  Past Medical History:  Diagnosis Date  . Hypertension     Patient Active Problem List   Diagnosis Date Noted  . Primary hypercoagulable state ([place]) 07/22/2019  . Long term (current) use of anticoagulants 07/22/2019  . Pulmonary embolism ([place]) 07/20/2019  . Acute respiratory failure with hypoxia ([place])   . Paroxysmal atrial fibrillation with RVR ([place])   . Acute pulmonary embolus ([place]) 07/13/2019  . Emphysema, unspecified ([place]) 07/13/2019  . Acute pulmonary embolism ([place]) 07/13/2019  . Generalized postprandial abdominal pain 03/16/2019  . Screening for colon cancer 03/16/2019  . 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 Swelling of lower extremity 11/16/2018  . Hypertension 11/16/2018  . Mitral valve prolapse 11/16/2018  . Tobacco use 11/16/2018  . Bereavement 11/16/2018    Past Surgical History:   Procedure Laterality Date  . HYSTERECTOMY ABDOMINAL WITH SALPINGO-OOPHORECTOMY  1975     OB History   No obstetric history on file.     Family History  Problem Relation Age of Onset  . Diabetes Mother   . Diabetes Father   . CAD Father   . Hypothyroidism Sister   . Melanoma Sister   . Ovarian cancer Other   . Uterine cancer Other   . Breast cancer Other   . Diabetes Son   . Uterine cancer Niece   . Pancreatic cancer Nephew     Social History   Tobacco Use  . Smoking status: Former Smoker    Packs/day: 0.50    Types: Cigarettes    Quit date: 07/01/2019    Years since quitting: 0.0  . Smokeless tobacco: Never Used  . Tobacco comment: cutting back   Substance Use Topics  . Alcohol use: No  . Drug use: No    Home Medications Prior to Admission medications   Medication Sig Start Date End Date Taking? Authorizing Provider  acetaminophen (TYLENOL) 500 MG tablet Take 1,000 mg by mouth as needed for moderate pain.     [provider]  cephALEXin (KEFLEX) 500 MG capsule Take 1 capsule (500 mg total) by mouth 3 (three) times daily. 07/28/19   Jaskiran Pata, Barbette Hair, MD  enoxaparin (LOVENOX) 80 MG/0.8ML injection Inject 0.8 mLs (80 mg total) into the skin every 12 (twelve) hours. 07/21/19 08/20/19  Mitzi Hansen, MD  lisinopril (ZESTRIL) 20 MG tablet TAKE 1 TABLET BY MOUTH EVERY DAY Patient  taking differently: Take 20 mg by mouth daily.  04/12/19   [place], [name], DO  metoprolol tartrate (LOPRESSOR) 50 MG tablet Take 1 tablet (50 mg total) by mouth 2 (two) times daily. 07/16/19 08/15/19  Mitzi Hansen, MD    Allergies    Patient has no known allergies.  Review of Systems   Review of Systems  Constitutional: Negative for fever.  Respiratory: Negative for shortness of breath.   Cardiovascular: Negative for chest pain.  Gastrointestinal: Negative for abdominal pain, nausea and vomiting.  Genitourinary: Positive for frequency and hematuria. Negative for dysuria.   Neurological: Negative for dizziness and light-headedness.  All other systems reviewed and are negative.   Physical Exam Updated Vital Signs BP 125/74   Pulse 67   Temp 98.2 F (36.8 C) (Oral)   Resp 20   Ht 1.626 m (5\' 4" )   Wt 72.6 kg   SpO2 95%   BMI 27.46 kg/m   Physical Exam Vitals and nursing note reviewed.  Constitutional:      Appearance: She is well-developed.     Comments: Chronically ill-appearing but nontoxic  HENT:     Head: Normocephalic and atraumatic.     Mouth/Throat:     Mouth: Mucous membranes are moist.  Eyes:     Pupils: Pupils are equal, round, and reactive to light.  Cardiovascular:     Rate and Rhythm: Normal rate and regular rhythm.     Heart sounds: Normal heart sounds.  Pulmonary:     Effort: Pulmonary effort is normal. No respiratory distress.     Breath sounds: No wheezing.     Comments: Nasal cannula in place Abdominal:     General: Bowel sounds are normal.     Palpations: Abdomen is soft.  Genitourinary:    Comments: Deferred given reported recent exam earlier today Musculoskeletal:     Cervical back: Neck supple.  Skin:    General: Skin is warm and dry.  Neurological:     Mental Status: She is alert and oriented to person, place, and time.  Psychiatric:        Mood and Affect: Mood normal.     ED Results / Procedures / Treatments   Labs (all labs ordered are listed, but only abnormal results are displayed) Labs Reviewed  COMPREHENSIVE METABOLIC PANEL - Abnormal; Notable for the following components:      Result Value   Glucose, Bld 186 (*)    Calcium 8.6 (*)    Total Protein 5.6 (*)    Albumin 2.4 (*)    ALT 55 (*)    All other components within normal limits  CBC - Abnormal; Notable for the following components:   WBC 11.9 (*)    RBC 3.73 (*)    Hemoglobin 11.0 (*)    HCT 35.6 (*)    Platelets 463 (*)    All other components within normal limits  URINALYSIS, ROUTINE W REFLEX MICROSCOPIC - Abnormal; Notable for  the following components:   Color, Urine BROWN (*)    APPearance TURBID (*)    Glucose, UA   (*)    Value: TEST NOT REPORTED DUE TO COLOR INTERFERENCE OF URINE PIGMENT   Hgb urine dipstick   (*)    Value: TEST NOT REPORTED DUE TO COLOR INTERFERENCE OF URINE PIGMENT   Bilirubin Urine   (*)    Value: TEST NOT REPORTED DUE TO COLOR INTERFERENCE OF URINE PIGMENT   Ketones, ur   (*)    Value:  TEST NOT REPORTED DUE TO COLOR INTERFERENCE OF URINE PIGMENT   Protein, ur   (*)    Value: TEST NOT REPORTED DUE TO COLOR INTERFERENCE OF URINE PIGMENT   Nitrite   (*)    Value: TEST NOT REPORTED DUE TO COLOR INTERFERENCE OF URINE PIGMENT   Leukocytes,Ua   (*)    Value: TEST NOT REPORTED DUE TO COLOR INTERFERENCE OF URINE PIGMENT   All other components within normal limits  URINALYSIS, MICROSCOPIC (REFLEX) - Abnormal; Notable for the following components:   Bacteria, UA FEW (*)    All other components within normal limits  URINE CULTURE  TYPE AND SCREEN  ABO/RH    EKG None  Radiology No results found.  Procedures Procedures (including critical care time)  Medications Ordered in ED Medications  cefTRIAXone (ROCEPHIN) injection 1 g (1 g Intramuscular Given 07/28/19 0415)  sterile water (preservative free) injection (2.1 mLs  Given 07/28/19 0420)    ED Course  I have reviewed the triage vital signs and the nursing notes.  Pertinent labs & imaging results that were available during my care of the patient were reviewed by me and considered in my medical decision making (see chart for details).  Clinical Course as of Jul 28 427  Wed Jul 28, 2019  0429 Spoke with internal medicine resident teaching service.  Discussed plan of care.  Patient hemodynamically stable and with stable hemoglobin.  Urine analysis difficult to interpret.  We will plan to treat for UTI.  Continue Lovenox and follow-up in clinic.   [CH]    Clinical Course User Index [CH] Dom Haverland, Barbette Hair, MD   MDM  Rules/Calculators/A&P                           Patient presents with descriptions of gross hematuria.  She reports only bleeding with urination.  She reports having a normal rectal and vaginal exam today without blood although clinic notes do not go into detail.  Will defer exam at this time.  I have reviewed her labs from triage.  Hemoglobin is stable.  Vital signs are normal and she is not in any distress.  She is not hemodynamically unstable.  Requested urinalysis.  Urinalysis is difficult to interpret given gross hematuria.  There are greater than 50 red blood cells, 6-10 white cells and few bacteria.  Urine culture was sent.  Given prevalence of hematuria with UTI, will treat for urinary tract infection.  Discussed this with internal medicine resident service as above.  Plan for continuing Lovenox given benefits of Lovenox injections for PE treatment as patient is hemodynamically stable and has no evidence of clinically significant bleeding.  If bleeding persists with UTI treatment, patient will need urology evaluation.  After history, exam, and medical workup I feel the patient has been appropriately medically screened and is safe for discharge home. Pertinent diagnoses were discussed with the patient. Patient was given return precautions.   Final Clinical Impression(s) / ED Diagnoses Final diagnoses:  Gross hematuria    Rx / DC Orders ED Discharge Orders         Ordered    cephALEXin (KEFLEX) 500 MG capsule  3 times daily     Discontinue  Reprint     07/28/19 0428           Merryl Hacker, MD 07/28/19 [phone]

## 2019-07-28 NOTE — Assessment & Plan Note (Addendum)
Patient admitted to hospital on 6/21-6/25 for acute hypoxic respiratory failure secondary to acute bilateral pulmonary embolisms. These were presumably unprovoked and patient discharged with Eliquis and supplemental oxygen. She was readmitted on 6/29-6/30  with worsening dyspnea and pleuritic chest pain, noted to have propogation of her PE while on Eliquis. Patient discharged on Lovenox subq twice daily and recommended to start bridge to warfarin on 7/1 when following up in coumadin clinic. She is also recommended to follow up with hematologist given strong family history of coagulation disorder (sister with antiphospholipid syndrome).  At this visit, patient is down to 2L oxygen (discharged on 3L oxygen). She notes significant improvement in pleuritic chest pain, and endorses only taking Tylenol once over the past 72 hours. On examination, lungs are clear to auscultation bilaterally.  Concerns for possible malignancy promoting a hypercoagulable state are also a possibility in addition to her family history of coagulation disorder. Patient does have significant history of tobacco use disorder. She has not had a colonoscopy yet. She would benefit from routine age appropriate cancer screening. At her visit on 7/1 in coumadin clinic, decision made to hold off on warfarin bridge pending her evaluation by hematologist. Patient recommended to continue Lovenox 80mg  q12h and f/u with hematologist on 7/20.

## 2019-07-28 NOTE — Progress Notes (Signed)
Internal Medicine Clinic Attending ° °Case discussed with Dr. Aslam  At the time of the visit.  We reviewed the resident’s history and exam and pertinent patient test results.  I agree with the assessment, diagnosis, and plan of care documented in the resident’s note.  °

## 2019-07-29 ENCOUNTER — Other Ambulatory Visit: Payer: Self-pay

## 2019-07-29 ENCOUNTER — Encounter: Payer: Self-pay | Admitting: Internal Medicine

## 2019-07-29 ENCOUNTER — Telehealth: Payer: Self-pay | Admitting: *Deleted

## 2019-07-29 ENCOUNTER — Ambulatory Visit (HOSPITAL_COMMUNITY)
Admission: RE | Admit: 2019-07-29 | Discharge: 2019-07-29 | Disposition: A | Discharge status: Home / Self Care | Payer: Medicare HMO | Source: Ambulatory Visit | Attending: Student in an Organized Health Care Education/Training Program | Admitting: Student in an Organized Health Care Education/Training Program

## 2019-07-29 ENCOUNTER — Observation Stay: Admission: AD | Admit: 2019-07-29 | Payer: Medicare HMO | Source: Ambulatory Visit | Admitting: Internal Medicine

## 2019-07-29 ENCOUNTER — Ambulatory Visit (INDEPENDENT_AMBULATORY_CARE_PROVIDER_SITE_OTHER): Payer: Medicare HMO | Admitting: Internal Medicine

## 2019-07-29 DIAGNOSIS — D3500 Benign neoplasm of unspecified adrenal gland: Secondary | ICD-10-CM | POA: Diagnosis not present

## 2019-07-29 DIAGNOSIS — I2699 Other pulmonary embolism without acute cor pulmonale: Secondary | ICD-10-CM | POA: Diagnosis not present

## 2019-07-29 DIAGNOSIS — R1011 Right upper quadrant pain: Secondary | ICD-10-CM | POA: Insufficient documentation

## 2019-07-29 DIAGNOSIS — K429 Umbilical hernia without obstruction or gangrene: Secondary | ICD-10-CM | POA: Diagnosis not present

## 2019-07-29 DIAGNOSIS — N1330 Unspecified hydronephrosis: Secondary | ICD-10-CM | POA: Diagnosis not present

## 2019-07-29 DIAGNOSIS — N134 Hydroureter: Secondary | ICD-10-CM | POA: Diagnosis not present

## 2019-07-29 LAB — URINE CULTURE: Culture: 60000 — AB

## 2019-07-29 MED ORDER — ONDANSETRON HCL 4 MG PO TABS: 4.0000 mg | ORAL_TABLET | Freq: Every day | ORAL | 0 refills | Status: DC | PRN

## 2019-07-29 MED ORDER — KETOROLAC TROMETHAMINE 30 MG/ML IJ SOLN
30.0000 mg | Freq: Once | INTRAMUSCULAR | Status: AC
  Administered 2019-07-29: 30 mg via INTRAMUSCULAR

## 2019-07-29 MED ORDER — OXYCODONE HCL 5 MG PO TABS: 5.0000 mg | ORAL_TABLET | ORAL | 0 refills | Status: DC | PRN

## 2019-07-29 MED ORDER — MELOXICAM 15 MG PO TABS: 15.0000 mg | ORAL_TABLET | Freq: Every day | ORAL | 0 refills | Status: DC

## 2019-07-29 NOTE — Telephone Encounter (Signed)
Spoke with Kimberly Walters's daughter regarding indication for NSAIDs for renal colic. Discussed risk of bleed with long term use especially with concurrent anti-coagulation therapy. Advised to use sparingly for severe pain and discontinue once pain has resolved promptly to avoid GI bleeds. Kimberly Walters expressed understanding.

## 2019-07-29 NOTE — Addendum Note (Signed)
Addended by: Mosetta Anis on: 07/29/2019 12:50 PM   Modules accepted: Orders

## 2019-07-29 NOTE — Telephone Encounter (Signed)
Patient's daughter called in asking to bring patient in early for today's appt. States patient has been vomiting since 2 AM (non-bloody emesis) also coughing up blood. Instructed daughter to head directly to ED. Patient refusing to go to ED 2/2 wait times. Daughter will bring patient in now but is advised that she may not be able to be seen early as provider schedule is full. Daughter states understanding. Hubbard Hartshorn, BSN, RN-BC

## 2019-07-29 NOTE — Assessment & Plan Note (Addendum)
Kimberly Walters is a 69 yo F w/ PMH of recent PEs on Lovenox, HTN, Paroxysmal A.fib, presenting to [facility] with complaint of acute flank pain. She was in her usual state of health until about 2am this morning when she had acute onset stabbing pain of her R flank without any obvious inciting event. She mentions the pain is constant 10/10 and never had pain like this in the past. She mentions continuing to have hematuria despite taking the keflex that was prescribed for her in the ED couple days prior for UTI. She denies any dysuria, urgency or frequency. Mentions having very small urine volume.  A/P Flank pain with hematuria concerning for renal colic due to nephrolithiasis. Acute onset with radiation and nausea/vomiting fits the disease script. Will get stat CT to assess for size. May need urgent referral to urology. Unable to tolerate oral intake at the moment  - IM toradol in clinic - stat CT renal study - May need to go to ED if unable to control symptoms  Addendum: CT scan showing no current nephrolithiasis. Read suggests pyelonephritis vs recent stone passage. Discussed results with Kimberly Walters and daughter. Advised to go home with anti-emetics, pain medications and c/w antibiotic therapy. Kimberly Walters is in agreement with the plan.

## 2019-07-29 NOTE — Progress Notes (Signed)
Internal Medicine Clinic Attending  I saw and evaluated the patient.  I personally confirmed the key portions of the history and exam documented by Dr. Truman Hayward and I reviewed pertinent patient test results.  The assessment, diagnosis, and plan were formulated together and I agree with the documentation in the residents note.   Clinical history very consistent with acute obstructing nephrolithiasis on the right side. Point-of-care ultrasound of the right kidney shows moderate hydronephrosis, there is a hypoechoic mass on the kidney that may be a complex cyst distant to the hydronephrosis. Will plan for stat CT abdomen this morning to rule in obstructing stone, measure size and location. Likely to need admission and intervention given hydronephrosis. Urologic surgery will be higher than average risk due to recent PE, we will not want to interrupt anticoagulation if at all possible right now.

## 2019-07-29 NOTE — Addendum Note (Signed)
Addended by: Mosetta Anis on: 07/29/2019 01:55 PM   Modules accepted: Orders

## 2019-07-29 NOTE — Telephone Encounter (Signed)
Call from pt's daughter,Kimberly Walters - state when she went to the pharmacy to pick up new medications; the pharmacist told her Meloxicam interacts with her blood thinner, Lvenox and not to take until she talks to the doctor.

## 2019-07-29 NOTE — Progress Notes (Signed)
   CC: Flank pain  HPI: Ms.Dennice L Belmar is a 69 y.o. with PMH listed below presenting with complaint of flank pain. Please see problem based assessment and plan for further details.  Past Medical History:  Diagnosis Date  . Hypertension    Review of Systems: Review of Systems  Constitutional: Negative for chills, fever and malaise/fatigue.  Eyes: Negative for blurred vision.  Respiratory: Negative for shortness of breath.   Cardiovascular: Negative for chest pain, palpitations and leg swelling.  Gastrointestinal: Positive for nausea and vomiting. Negative for diarrhea.  Genitourinary: Positive for flank pain and hematuria. Negative for dysuria, frequency and urgency.  All other systems reviewed and are negative.    Physical Exam: Vitals:   07/29/19 0946  BP: (!) 164/84  Pulse: 81  Temp: 98.2 F (36.8 C)  TempSrc: Oral  SpO2: 96%  Weight: 162 lb 3.2 oz (73.6 kg)  Height: 5\' 4"  (1.626 m)   Physical Exam Constitutional:      General: She is in acute distress (moaning in pain).     Appearance: She is ill-appearing.  HENT:     Nose: No congestion.     Mouth/Throat:     Mouth: Mucous membranes are moist.     Pharynx: Oropharynx is clear. No oropharyngeal exudate.  Eyes:     Conjunctiva/sclera: Conjunctivae normal.  Cardiovascular:     Rate and Rhythm: Normal rate and regular rhythm.     Pulses: Normal pulses.     Heart sounds: No murmur heard.   Pulmonary:     Effort: Pulmonary effort is normal.     Breath sounds: Normal breath sounds. No wheezing or rales.  Abdominal:     General: Abdomen is flat. Bowel sounds are normal. There is no distension.     Palpations: Abdomen is soft.     Tenderness: There is abdominal tenderness.  Musculoskeletal:        General: Tenderness present. No swelling.     Cervical back: Normal range of motion and neck supple.  Skin:    General: Skin is warm and dry.  Neurological:     General: No focal deficit present.     Mental  Status: She is oriented to person, place, and time.    Assessment & Plan:   Right upper quadrant abdominal pain Ms.Polakowski is a 69 yo F w/ PMH of recent PEs on Lovenox, HTN, Paroxysmal A.fib, presenting to [facility] with complaint of acute flank pain. She was in her usual state of health until about 2am this morning when she had acute onset stabbing pain of her R flank without any obvious inciting event. She mentions the pain is constant 10/10 and never had pain like this in the past. She mentions continuing to have hematuria despite taking the keflex that was prescribed for her in the ED couple days prior for UTI. She denies any dysuria, urgency or frequency. Mentions having very small urine volume.  A/P Flank pain with hematuria concerning for renal colic due to nephrolithiasis. Acute onset with radiation and nausea/vomiting fits the disease script. Will get stat CT to assess for size. May need urgent referral to urology. Unable to tolerate oral intake at the moment  - IM toradol in clinic - stat CT renal study - May need to go to ED if unable to control symptoms    Patient discussed with Dr. Evette Doffing  -Gilberto Better, [place] [facility] Pager: [phone]

## 2019-07-29 NOTE — Assessment & Plan Note (Addendum)
Recently admitted for PE. Instructed to hold per overnight provider due to concern for hematuria. Discussed case with Dr.Groce. Advised to continue anti-coagulation through this process to prevent re-occurence of PE

## 2019-07-30 ENCOUNTER — Telehealth: Payer: Self-pay | Admitting: Emergency Medicine

## 2019-07-30 ENCOUNTER — Other Ambulatory Visit: Payer: Self-pay

## 2019-07-30 ENCOUNTER — Encounter (HOSPITAL_COMMUNITY): Payer: Self-pay | Admitting: Internal Medicine

## 2019-07-30 ENCOUNTER — Inpatient Hospital Stay (HOSPITAL_COMMUNITY)
Admission: EM | Admit: 2019-07-30 | Discharge: 2019-08-05 | DRG: 690 | Disposition: A | Discharge status: Home / Self Care | Payer: Medicare HMO | Attending: Internal Medicine | Admitting: Internal Medicine

## 2019-07-30 ENCOUNTER — Telehealth: Payer: Self-pay | Admitting: Internal Medicine

## 2019-07-30 DIAGNOSIS — I480 Paroxysmal atrial fibrillation: Secondary | ICD-10-CM | POA: Diagnosis present

## 2019-07-30 DIAGNOSIS — D689 Coagulation defect, unspecified: Secondary | ICD-10-CM | POA: Diagnosis present

## 2019-07-30 DIAGNOSIS — R310 Gross hematuria: Secondary | ICD-10-CM | POA: Diagnosis present

## 2019-07-30 DIAGNOSIS — I2699 Other pulmonary embolism without acute cor pulmonale: Secondary | ICD-10-CM | POA: Diagnosis present

## 2019-07-30 DIAGNOSIS — D649 Anemia, unspecified: Secondary | ICD-10-CM | POA: Diagnosis present

## 2019-07-30 DIAGNOSIS — N179 Acute kidney failure, unspecified: Secondary | ICD-10-CM | POA: Diagnosis present

## 2019-07-30 DIAGNOSIS — J449 Chronic obstructive pulmonary disease, unspecified: Secondary | ICD-10-CM | POA: Diagnosis present

## 2019-07-30 DIAGNOSIS — Z833 Family history of diabetes mellitus: Secondary | ICD-10-CM

## 2019-07-30 DIAGNOSIS — I10 Essential (primary) hypertension: Secondary | ICD-10-CM | POA: Diagnosis present

## 2019-07-30 DIAGNOSIS — Z86711 Personal history of pulmonary embolism: Secondary | ICD-10-CM | POA: Diagnosis not present

## 2019-07-30 DIAGNOSIS — Z20822 Contact with and (suspected) exposure to covid-19: Secondary | ICD-10-CM | POA: Diagnosis present

## 2019-07-30 DIAGNOSIS — Z79899 Other long term (current) drug therapy: Secondary | ICD-10-CM

## 2019-07-30 DIAGNOSIS — N136 Pyonephrosis: Secondary | ICD-10-CM | POA: Diagnosis present

## 2019-07-30 DIAGNOSIS — J90 Pleural effusion, not elsewhere classified: Secondary | ICD-10-CM | POA: Diagnosis present

## 2019-07-30 DIAGNOSIS — Z791 Long term (current) use of non-steroidal anti-inflammatories (NSAID): Secondary | ICD-10-CM

## 2019-07-30 DIAGNOSIS — Z8249 Family history of ischemic heart disease and other diseases of the circulatory system: Secondary | ICD-10-CM | POA: Diagnosis not present

## 2019-07-30 DIAGNOSIS — B951 Streptococcus, group B, as the cause of diseases classified elsewhere: Secondary | ICD-10-CM | POA: Diagnosis present

## 2019-07-30 DIAGNOSIS — F17210 Nicotine dependence, cigarettes, uncomplicated: Secondary | ICD-10-CM | POA: Diagnosis present

## 2019-07-30 DIAGNOSIS — R1011 Right upper quadrant pain: Secondary | ICD-10-CM

## 2019-07-30 DIAGNOSIS — J439 Emphysema, unspecified: Secondary | ICD-10-CM | POA: Diagnosis present

## 2019-07-30 DIAGNOSIS — N12 Tubulo-interstitial nephritis, not specified as acute or chronic: Secondary | ICD-10-CM | POA: Diagnosis present

## 2019-07-30 DIAGNOSIS — R6510 Systemic inflammatory response syndrome (SIRS) of non-infectious origin without acute organ dysfunction: Secondary | ICD-10-CM

## 2019-07-30 DIAGNOSIS — T457X1A Poisoning by anticoagulant antagonists, vitamin K and other coagulants, accidental (unintentional), initial encounter: Secondary | ICD-10-CM | POA: Diagnosis present

## 2019-07-30 LAB — COMPREHENSIVE METABOLIC PANEL WITH GFR
ALT: 40 U/L (ref 0–44)
AST: 24 U/L (ref 15–41)
Albumin: 2.8 g/dL — ABNORMAL LOW (ref 3.5–5.0)
Alkaline Phosphatase: 67 U/L (ref 38–126)
Anion gap: 10 (ref 5–15)
BUN: 15 mg/dL (ref 8–23)
CO2: 28 mmol/L (ref 22–32)
Calcium: 8.6 mg/dL — ABNORMAL LOW (ref 8.9–10.3)
Chloride: 101 mmol/L (ref 98–111)
Creatinine, Ser: 1.11 mg/dL — ABNORMAL HIGH (ref 0.44–1.00)
GFR calc Af Amer: 59 mL/min — ABNORMAL LOW
GFR calc non Af Amer: 51 mL/min — ABNORMAL LOW
Glucose, Bld: 123 mg/dL — ABNORMAL HIGH (ref 70–99)
Potassium: 4.4 mmol/L (ref 3.5–5.1)
Sodium: 139 mmol/L (ref 135–145)
Total Bilirubin: 0.8 mg/dL (ref 0.3–1.2)
Total Protein: 6.1 g/dL — ABNORMAL LOW (ref 6.5–8.1)

## 2019-07-30 LAB — CBC WITH DIFFERENTIAL/PLATELET
Abs Immature Granulocytes: 0.31 10*3/uL — ABNORMAL HIGH (ref 0.00–0.07)
Basophils Absolute: 0.1 10*3/uL (ref 0.0–0.1)
Basophils Relative: 0 %
Eosinophils Absolute: 0.1 10*3/uL (ref 0.0–0.5)
Eosinophils Relative: 1 %
HCT: 36.9 % (ref 36.0–46.0)
Hemoglobin: 11.7 g/dL — ABNORMAL LOW (ref 12.0–15.0)
Immature Granulocytes: 2 %
Lymphocytes Relative: 6 %
Lymphs Abs: 1 10*3/uL (ref 0.7–4.0)
MCH: 29.3 pg (ref 26.0–34.0)
MCHC: 31.7 g/dL (ref 30.0–36.0)
MCV: 92.5 fL (ref 80.0–100.0)
Monocytes Absolute: 0.7 10*3/uL (ref 0.1–1.0)
Monocytes Relative: 4 %
Neutro Abs: 14.6 10*3/uL — ABNORMAL HIGH (ref 1.7–7.7)
Neutrophils Relative %: 87 %
Platelets: 399 10*3/uL (ref 150–400)
RBC: 3.99 MIL/uL (ref 3.87–5.11)
RDW: 13.5 % (ref 11.5–15.5)
WBC: 16.8 10*3/uL — ABNORMAL HIGH (ref 4.0–10.5)
nRBC: 0 % (ref 0.0–0.2)

## 2019-07-30 LAB — PROTIME-INR
INR: 1.1 (ref 0.8–1.2)
Prothrombin Time: 13.3 seconds (ref 11.4–15.2)

## 2019-07-30 LAB — URINALYSIS, ROUTINE W REFLEX MICROSCOPIC
Bacteria, UA: NONE SEEN
Bilirubin Urine: NEGATIVE
Glucose, UA: NEGATIVE mg/dL
Ketones, ur: NEGATIVE mg/dL
Leukocytes,Ua: NEGATIVE
Nitrite: NEGATIVE
Protein, ur: NEGATIVE mg/dL
Specific Gravity, Urine: 1.028 (ref 1.005–1.030)
pH: 5 (ref 5.0–8.0)

## 2019-07-30 LAB — LACTIC ACID, PLASMA: Lactic Acid, Venous: 1 mmol/L (ref 0.5–1.9)

## 2019-07-30 LAB — HEMOGLOBIN AND HEMATOCRIT, BLOOD
HCT: 32 % — ABNORMAL LOW (ref 36.0–46.0)
Hemoglobin: 10.2 g/dL — ABNORMAL LOW (ref 12.0–15.0)

## 2019-07-30 LAB — SARS CORONAVIRUS 2 BY RT PCR (HOSPITAL ORDER, PERFORMED IN ~~LOC~~ HOSPITAL LAB): SARS Coronavirus 2: NEGATIVE

## 2019-07-30 LAB — APTT: aPTT: 33 seconds (ref 24–36)

## 2019-07-30 MED ORDER — WARFARIN - PHARMACIST DOSING INPATIENT: Freq: Every day | Status: DC

## 2019-07-30 MED ORDER — LACTATED RINGERS IV SOLN: INTRAVENOUS | Status: DC

## 2019-07-30 MED ORDER — SODIUM CHLORIDE 0.9 % IV SOLN
2.0000 g | Freq: Once | INTRAVENOUS | Status: AC
  Administered 2019-07-30: 2 g via INTRAVENOUS

## 2019-07-30 MED ORDER — METOPROLOL TARTRATE 12.5 MG HALF TABLET
12.5000 mg | ORAL_TABLET | Freq: Two times a day (BID) | ORAL | Status: DC
  Administered 2019-07-30 – 2019-08-05 (×12): 12.5 mg via ORAL
  Filled 2019-07-30 (×12): qty 1

## 2019-07-30 MED ORDER — SODIUM CHLORIDE 0.9 % IV BOLUS
1000.0000 mL | Freq: Once | INTRAVENOUS | Status: AC
  Administered 2019-07-30: 1000 mL via INTRAVENOUS

## 2019-07-30 MED ORDER — CEPHALEXIN 250 MG PO CAPS: 500.0000 mg | ORAL_CAPSULE | Freq: Three times a day (TID) | ORAL | Status: DC

## 2019-07-30 MED ORDER — CEFTRIAXONE SODIUM 1 G IJ SOLR
2.0000 g | Freq: Once | INTRAMUSCULAR | Status: DC
  Filled 2019-07-30: qty 20

## 2019-07-30 MED ORDER — FENTANYL CITRATE (PF) 100 MCG/2ML IJ SOLN
100.0000 ug | Freq: Once | INTRAMUSCULAR | Status: AC
  Administered 2019-07-30: 100 ug via INTRAVENOUS
  Filled 2019-07-30: qty 2

## 2019-07-30 MED ORDER — ONDANSETRON HCL 4 MG/2ML IJ SOLN
4.0000 mg | Freq: Once | INTRAMUSCULAR | Status: AC
  Administered 2019-07-30: 4 mg via INTRAVENOUS
  Filled 2019-07-30: qty 2

## 2019-07-30 MED ORDER — HYDROMORPHONE HCL 1 MG/ML IJ SOLN
0.5000 mg | Freq: Four times a day (QID) | INTRAMUSCULAR | Status: DC | PRN
  Administered 2019-07-31 – 2019-08-03 (×6): 0.5 mg via INTRAVENOUS
  Filled 2019-07-30 (×6): qty 1

## 2019-07-30 MED ORDER — ACETAMINOPHEN 500 MG PO TABS
1000.0000 mg | ORAL_TABLET | Freq: Once | ORAL | Status: AC
  Administered 2019-07-30: 1000 mg via ORAL
  Filled 2019-07-30: qty 2

## 2019-07-30 MED ORDER — ONDANSETRON HCL 4 MG/2ML IJ SOLN
4.0000 mg | Freq: Four times a day (QID) | INTRAMUSCULAR | Status: DC | PRN
  Administered 2019-08-03: 4 mg via INTRAVENOUS
  Filled 2019-07-30 (×2): qty 2

## 2019-07-30 MED ORDER — WARFARIN SODIUM 5 MG PO TABS
5.0000 mg | ORAL_TABLET | Freq: Once | ORAL | Status: AC
  Administered 2019-07-30: 5 mg via ORAL
  Filled 2019-07-30 (×2): qty 1

## 2019-07-30 MED ORDER — METOPROLOL TARTRATE 25 MG PO TABS: 50.0000 mg | ORAL_TABLET | Freq: Two times a day (BID) | ORAL | Status: DC

## 2019-07-30 MED ORDER — SODIUM CHLORIDE 0.9 % IV SOLN
1.0000 g | INTRAVENOUS | Status: DC
  Administered 2019-07-31 – 2019-08-05 (×6): 1 g via INTRAVENOUS
  Filled 2019-07-30: qty 1
  Filled 2019-07-30: qty 10
  Filled 2019-07-30 (×2): qty 1
  Filled 2019-07-30 (×3): qty 10

## 2019-07-30 MED ORDER — LACTATED RINGERS IV BOLUS
1000.0000 mL | Freq: Once | INTRAVENOUS | Status: AC
  Administered 2019-07-30: 1000 mL via INTRAVENOUS

## 2019-07-30 MED ORDER — OXYCODONE HCL 5 MG PO TABS: 5.0000 mg | ORAL_TABLET | ORAL | Status: DC | PRN

## 2019-07-30 MED ORDER — ENOXAPARIN SODIUM 80 MG/0.8ML ~~LOC~~ SOLN
1.0000 mg/kg | Freq: Two times a day (BID) | SUBCUTANEOUS | Status: DC
  Administered 2019-07-30: 72.5 mg via SUBCUTANEOUS
  Filled 2019-07-30: qty 0.8
  Filled 2019-07-30 (×2): qty 0.72

## 2019-07-30 NOTE — Telephone Encounter (Signed)
Agree with referral to ER.

## 2019-07-30 NOTE — Telephone Encounter (Signed)
Post ED Visit - Positive Culture Follow-up  Culture report reviewed by antimicrobial stewardship pharmacist: [place] Team []  Elenor Quinones, Pharm.D. []  Heide Guile, Pharm.D., BCPS AQ-ID []  Parks Neptune, Pharm.D., BCPS []  Alycia Rossetti, Pharm.D., BCPS []  [place], Pharm.D., BCPS, AAHIVP []  Legrand Como, Pharm.D., BCPS, AAHIVP [x]  Salome Arnt, PharmD, BCPS []  Johnnette Gourd, PharmD, BCPS []  Hughes Better, PharmD, BCPS []  Leeroy Cha, PharmD []  Laqueta Linden, PharmD, BCPS []  Albertina Parr, PharmD  [place] Team []  Leodis Sias, PharmD []  Lindell Spar, PharmD []  Royetta Asal, PharmD []  Graylin Shiver, Rph []  Rema Fendt) Glennon Mac, PharmD []  Arlyn Dunning, PharmD []  Netta Cedars, PharmD []  Dia Sitter, PharmD []  Leone Haven, PharmD []  Gretta Arab, PharmD []  Theodis Shove, PharmD []  Peggyann Juba, PharmD []  Reuel Boom, PharmD   Positive urine culture Treated with Cephalexin, organism sensitive to the same and no further patient follow-up is required at this time.  Sandi Raveling Amazin Pincock 07/30/2019, 11:48 AM

## 2019-07-30 NOTE — ED Triage Notes (Signed)
Per [facility] pt presents with Right flank pain x2 days, hematuria x2 weeks, recently started a blood thinner for a PE. Pt seen here 7/6 for coughing up blood.  CT scan 2 days ago, EMS reports pt recently dx w/a mass to her kidney. IM advised her to come to ED for evaluation for uncontrolled pain. Taking oxycodone and meloxicam for pain.   20g LH   BP 135/83 HR 85 RR 16 95% 4L [place] wear this at home CBG 148

## 2019-07-30 NOTE — ED Notes (Signed)
Claudia PA at bedside  

## 2019-07-30 NOTE — Telephone Encounter (Signed)
Pt daughter is calling to inform physician is not feeling any better from yesterday & the pain medicine isn't working pls contact [phone]

## 2019-07-30 NOTE — Progress Notes (Signed)
Pt arrived to the unit. Pt not in distress and tolerated well. Vital signs taken, family at bedside.

## 2019-07-30 NOTE — Progress Notes (Signed)
Pharmacy Antibiotic Note  Kimberly Walters is a 69 y.o. female admitted on 07/30/2019 with UTI.  Pharmacy has been consulted for cephalexin dosing. WBC 16.8. Tm 103.63F. SCr 1.11. Of note, UCx from 7/7 grew out 60k CFU's of GBS.   Plan: Cephalexin 500 mg PO three times daily Monitor CBC, renal fx, cultures and clinical progress   Height: 5\' 4"  (162.6 cm) Weight: 72.6 kg (160 lb) IBW/kg (Calculated) : 54.7  Temp (24hrs), Avg:99.3 F (37.4 C), Min:99.3 F (37.4 C), Max:99.3 F (37.4 C)  Recent Labs  Lab 07/27/19 1638 07/27/19 2233 07/30/19 1242 07/30/19 1252  WBC 10.9* 11.9* 16.8*  --   CREATININE  --  0.81 1.11*  --   LATICACIDVEN  --   --   --  1.0    Estimated Creatinine Clearance: 46.7 mL/min (A) (by C-G formula based on SCr of 1.11 mg/dL (H)).    No Known Allergies   Thank you for allowing pharmacy to be a part of this patient's care.  Albertina Parr, PharmD., BCPS, BCCCP Clinical Pharmacist Clinical phone for 07/30/19 until 11:30pm: [phone] If after 11:30pm, please refer to [facility] for unit-specific pharmacist

## 2019-07-30 NOTE — Telephone Encounter (Addendum)
Received 2nd phone call from patient's daughter, Kimberly Walters.  She states "We are now in the ER and they haven't put my mom in a freaking room yet!  Are we going to have to wait all day!  Yesterday we were able to be seen in your office and she is a lot worse today and in a lot more pain!".  RN explained to daughter, again, ER triage procedure and patient is in the best place now if anything emergent were to happen, Informed daughter both Dr. Truman Hayward and attending were in agreement patient needs to be evaluated in ER.  Patient's daughter verbalized understanding and ended call. SChaplin, RN,BSN

## 2019-07-30 NOTE — Plan of Care (Signed)
Problem: Education: °Goal: Knowledge of General Education information will improve °Description: Including pain rating scale, medication(s)/side effects and non-pharmacologic comfort measures °Outcome: Completed/Met °  °

## 2019-07-30 NOTE — H&P (Addendum)
Date: 07/30/2019               Patient Name:  Kimberly Walters MRN: [id number]  DOB: [birth year] Age / Sex: 69 y.o., female   PCP: Delice Bison, DO         Medical Service: Internal Medicine Teaching Service         Attending Physician: Dr. Rebeca Alert Raynaldo Opitz, MD    First Contact: Dr. Alfonse Spruce Pager: [phone]  Second Contact: Dr. Eileen Stanford Pager: [phone]       After Hours (After 5p/  First Contact Pager: [phone]  weekends / holidays): Second Contact Pager: [phone]   Chief Complaint: Right lower back pain with hematuria  History of Present Illness:  Patient is a 69 yo female with history of PE on Lovenox, A-fib, HTN, who presents to the ER for Right low back pain, associated with hematuria, nausea and vomiting.   Patient was seen in the ED on 07/28/19 for hematuria. Urine culture grew Group B Strep. She was started on Keflex 500 mg for. When she was seen at the clinic on 07/29/19, CT was order for her acute onset of R flank pain and hematuria. CT shows no evidence of nephrolithiasis. There is perinephric stranding and perinephric fluid which can suggest Pyelonephritis. Patient was sent home with Oxycodone for pain and continue antibiotics. Overnight, her daughter reports of right flank pain since morning with hematuria, associated with nausea. Patient states that it is the worst pain of her life, which Oxycodone only provide minimal relief. Patient states that pain originates from Right lower flank and moves up to between her scapulas. Shortness of breath and shivering was also present. Patient states that she is taking the Keflex and Lovenox as instructed.   Patient was examined at bedside today. She reports pain has significantly improves after receiving Fetanyl injection. Rate 1/10. Patient denies dysuria, chest pain, headache, weight loss or night sweat.    Meds: Current Meds  Medication Sig   acetaminophen (TYLENOL) 500 MG tablet Take 1,000 mg by mouth as needed for moderate pain.     cephALEXin (KEFLEX) 500 MG capsule Take 1 capsule (500 mg total) by mouth 3 (three) times daily.   enoxaparin (LOVENOX) 80 MG/0.8ML injection Inject 0.8 mLs (80 mg total) into the skin every 12 (twelve) hours.   lisinopril (ZESTRIL) 20 MG tablet TAKE 1 TABLET BY MOUTH EVERY DAY (Patient taking differently: Take 20 mg by mouth daily. )   meloxicam (MOBIC) 15 MG tablet Take 1 tablet (15 mg total) by mouth daily.   metoprolol tartrate (LOPRESSOR) 50 MG tablet Take 1 tablet (50 mg total) by mouth 2 (two) times daily.   ondansetron (ZOFRAN) 4 MG tablet Take 1 tablet (4 mg total) by mouth daily as needed for nausea or vomiting.   oxyCODONE (ROXICODONE) 5 MG immediate release tablet Take 1 tablet (5 mg total) by mouth every 4 (four) hours as needed for up to 5 days for severe pain.     Allergies: Allergies as of 07/30/2019   (No Known Allergies)   Past Medical History:  Diagnosis Date   Hypertension     Family History:  Many family members of blood clot disorders and disabetes  Social History:  Former smoker. Quit recently. Was smoking 1/2 pack a day Alcohol occasionally No drugs use  Review of Systems: Review of Systems  Constitutional: Positive for chills and fever. Negative for weight loss.  HENT: Negative for hearing loss.   Respiratory: Positive  for shortness of breath.   Cardiovascular: Negative for chest pain.  Gastrointestinal: Positive for abdominal pain and nausea.  Genitourinary: Positive for flank pain and hematuria. Negative for dysuria.  Neurological: Negative for headaches.  Psychiatric/Behavioral: Negative for substance abuse.    Physical Exam: Blood pressure 124/67, pulse 82, temperature 99 F (37.2 C), temperature source Oral, resp. rate 18, height 5\' 4"  (1.626 m), weight 72.6 kg, SpO2 95 %.  Physical Exam Constitutional:      Comments: In mild distress  HENT:     Head: Normocephalic.  Eyes:     General: No scleral icterus. Cardiovascular:     Rate and  Rhythm: Normal rate and regular rhythm.     Heart sounds: Normal heart sounds. No murmur heard.   Pulmonary:     Effort: No respiratory distress.     Comments: Crackles noted at Left lower lobe  Chest:     Chest wall: No tenderness.  Abdominal:     General: Bowel sounds are normal.     Palpations: Abdomen is soft.     Tenderness: There is no abdominal tenderness. There is right CVA tenderness. There is no left CVA tenderness.  Musculoskeletal:     Right lower leg: No edema.     Left lower leg: No edema.  Skin:    General: Skin is warm.     Coloration: Skin is not jaundiced.  Neurological:     Mental Status: She is alert.     EKG: personally reviewed my interpretation is sinus rhythm   Assessment & Plan by Problem:  Patient is a 69 yo female with history of PE on Lovenox, A-fib, HTN, who presents to the ER for Right low back pain, associated with hematuria, nausea and vomiting. Currently treating for Pyelonephritis.    Principal Problem:   Pyelonephritis Active Problems:   Hypertension   Emphysema, unspecified ([place])   Pulmonary embolism (HCC)   Pleural effusion on left  1. Pyelonephritis Patient presenting with Right CVA tenderness and hematuria, which suggest renal calculi. CT renal stone study reveals no evidence of nephrolithiasis.There is perinephric stranding and perinephric fluid which can suggest Pyelonephritis. Urine culture 07/28/19 grew Group B strep. 2/4 SIRS criteria on admission (RR 21, Temp 103.1). She is admitted for treatment of urosepsis secondary to pyelonephritis.  - Start Cetriaxone 2g (day 1) - 1 L bolus of LR given. Continue LR 100cc/h  - UA negative for Nitrites and Leukocytes, likely because patient was taking Keflex at home. Pending urine culture.  - Blood culture pending  - Dilaudid 0.5 mg Q6h PRN for pain - Zofran PRN for nausea  2. History of Pulmonary embolism Patient was in the ED in July 12 2019 for pulmonary embolism. Eliquis was started.  Patient was seen again in the ED in 07/20/19. Patient was switched to Lovenox and planned to bridge to Warfarin.  - Continue Lovenox and Warfarin bridging - Pharmacy follow INR - Monitor H&H q6h. If Hgb drop 1-2 g or visible bleeding, will hold anticoagulants. Baseline Hgb ~ 11  3. Acute kidney injury - Baseline Creatine 0.8. Current Cr 1.11 - Continue LR 100 cc/h.  - Follow up BMP in AM  4. Pleural Effusion Left pleural effusion was present since 07/12/19. Differentials include neoplasm given smoking history. Crackle noted on at left lower lobe on physical exam. Patient is not fluid overload on exam. - Monitor at the moment - Patient quit smoking recently  5. Hypertension  - Holding home med Lisinopril given current  BP  6. A-fib - Continue Metoprolol - Lovenox-Warfarin for anticoagulation  7. Emphysema disease - Patient has history of smoking. Quit recently - O2 sat 95% on 2L   Diet: regular IVF: 100cc/h LR CODE: Full DVT: Lovenox-Warfarin  Dispo: Admit patient to Inpatient with expected length of stay greater than 2 midnights.  Signed: Gaylan Gerold, DO 07/30/2019, 6:50 PM  Pager: [phone] After 5pm on weekdays and 1pm on weekends: On Call pager: [phone]

## 2019-07-30 NOTE — Progress Notes (Signed)
ANTICOAGULATION CONSULT NOTE - Initial Consult  Pharmacy Consult for Warfarin + Lovenox bridge  Indication: pulmonary embolus  No Known Allergies  Patient Measurements: Height: 5\' 4"  (162.6 cm) Weight: 72.6 kg (160 lb) IBW/kg (Calculated) : 54.7  Vital Signs: Temp: 103.1 F (39.5 C) (07/09 1527) Temp Source: Rectal (07/09 1527) BP: 131/57 (07/09 1645) Pulse Rate: 89 (07/09 1645)  Labs: Recent Labs    07/27/19 2233 07/30/19 1242 07/30/19 1606  HGB 11.0* 11.7*  --   HCT 35.6* 36.9  --   PLT 463* 399  --   APTT  --   --  33  LABPROT  --   --  13.3  INR  --   --  1.1  CREATININE 0.81 1.11*  --     Estimated Creatinine Clearance: 46.7 mL/min (A) (by C-G formula based on SCr of 1.11 mg/dL (H)).   Medical History: Past Medical History:  Diagnosis Date  . Hypertension     Medications:  (Not in a hospital admission)   Assessment: 3 YOF with h/o PE on Lovenox to start warfarin with Lovenox bridge. INR on admission is 1.1. Hgb low, Plt wnl. SCr 1.11   Goal of Therapy:  INR 2-3 Monitor platelets by anticoagulation protocol: Yes   Plan:  -Warfarin 5 mg x 1 dose today -Lovenox 1 mg/kg (72.5 mg) twice daily -Monitor CBC, daily INR, renal fx and s/s of bleeding   Albertina Parr, PharmD., BCPS, BCCCP Clinical Pharmacist Clinical phone for 07/30/19 until 11:30pm: [phone] If after 11:30pm, please refer to [facility] for unit-specific pharmacist

## 2019-07-30 NOTE — ED Notes (Signed)
Report called to [facility] RN 57M

## 2019-07-30 NOTE — ED Provider Notes (Signed)
[facility] EMERGENCY DEPARTMENT Provider Note   CSN: [id number] Arrival date & time: 07/30/19  1029     History Chief Complaint  Patient presents with   Flank Pain    Kimberly Walters is a 69 y.o. female with history of PE on lovenox, HTN, PAF presents to ER for evaluation of right low back pain for the next 24 hours associated with hematuria, nausea, vomiting.  Patient has had blood in urine for at least 2 weeks. Seen in ER for this on 7/6 and was told she had a UTI, compliant with keflex for the last 3 days.  Went to PCP yesterday who did a CT and told her it was probably a stone per daughter report.  Daughter at bedside expressing frustration and "don't think it is just a stone".  States patient needs something else "other than a CT or x-ray".  Daughter concern this is a stroke because patient started shaking this morning and couldn't walk.  Patient states this is from the pain.  Pain was sudden this morning despite oxycodone every 4 hours.  Pain is constant, severe in right low back. States it radiated to the right abdomen but not anymore.  Daughter states it was so bad patient started shaking shivering and unable to walk.  Denies fall, trauma. Patient reports feeling cold sometimes. Daughter states EMS told her she had a fever but can't remember the recorded temperature.  Oral temp 99.7 on arrival to ER.  Patient had meloxicam in waiting room.  Denies history of kidney stones.  Denies extremity pain, paresthesias, weakness or numbness.    HPI     Past Medical History:  Diagnosis Date   Hypertension     Patient Active Problem List   Diagnosis Date Noted   Right upper quadrant abdominal pain 07/29/2019   Anticoagulant-induced bleeding ([place]) 07/28/2019   Primary hypercoagulable state ([place]) 07/22/2019   Long term (current) use of anticoagulants 07/22/2019   Pulmonary embolism ([place]) 07/20/2019   Acute respiratory failure with hypoxia (HCC)    Paroxysmal atrial  fibrillation with RVR (HCC)    Emphysema, unspecified ([place]) 07/13/2019   Acute pulmonary embolism ([place]) 07/13/2019   Generalized postprandial abdominal pain 03/16/2019   Screening for colon cancer 03/16/2019   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 11/17/2018   Swelling of lower extremity 11/16/2018   Hypertension 11/16/2018   Mitral valve prolapse 11/16/2018   Tobacco use 11/16/2018   Bereavement 11/16/2018    Past Surgical History:  Procedure Laterality Date   HYSTERECTOMY ABDOMINAL WITH SALPINGO-OOPHORECTOMY  1975     OB History   No obstetric history on file.     Family History  Problem Relation Age of Onset   Diabetes Mother    Diabetes Father    CAD Father    Hypothyroidism Sister    Melanoma Sister    Ovarian cancer Other    Uterine cancer Other    Breast cancer Other    Diabetes Son    Uterine cancer Niece    Pancreatic cancer Nephew     Social History   Tobacco Use   Smoking status: Former Smoker    Packs/day: 0.50    Types: Cigarettes    Quit date: 07/01/2019    Years since quitting: 0.0   Smokeless tobacco: Never Used   Tobacco comment: cutting back   Substance Use Topics   Alcohol use: No   Drug use: No    Home Medications Prior to Admission medications   Medication  Sig Start Date End Date Taking? Authorizing Provider  acetaminophen (TYLENOL) 500 MG tablet Take 1,000 mg by mouth as needed for moderate pain.    Yes [provider]  cephALEXin (KEFLEX) 500 MG capsule Take 1 capsule (500 mg total) by mouth 3 (three) times daily. 07/28/19  Yes Horton, Barbette Hair, MD  enoxaparin (LOVENOX) 80 MG/0.8ML injection Inject 0.8 mLs (80 mg total) into the skin every 12 (twelve) hours. 07/21/19 08/20/19 Yes Christian, Rylee, MD  lisinopril (ZESTRIL) 20 MG tablet TAKE 1 TABLET BY MOUTH EVERY DAY Patient taking differently: Take 20 mg by mouth daily.  04/12/19  Yes [place]field, [name], DO  meloxicam (MOBIC) 15 MG tablet Take 1 tablet  (15 mg total) by mouth daily. 07/29/19  Yes Mosetta Anis, MD  metoprolol tartrate (LOPRESSOR) 50 MG tablet Take 1 tablet (50 mg total) by mouth 2 (two) times daily. 07/16/19 08/15/19 Yes Christian, Rylee, MD  ondansetron (ZOFRAN) 4 MG tablet Take 1 tablet (4 mg total) by mouth daily as needed for nausea or vomiting. 07/29/19 07/28/20 Yes Mosetta Anis, MD  oxyCODONE (ROXICODONE) 5 MG immediate release tablet Take 1 tablet (5 mg total) by mouth every 4 (four) hours as needed for up to 5 days for severe pain. 07/29/19 08/03/19 Yes Mosetta Anis, MD    Allergies    Patient has no known allergies.  Review of Systems   Review of Systems  Constitutional: Positive for chills (rigors).  Gastrointestinal: Positive for nausea and vomiting.  Genitourinary: Positive for hematuria.  Musculoskeletal: Positive for back pain.  All other systems reviewed and are negative.   Physical Exam Updated Vital Signs BP 137/66 (BP Location: Left Arm)    Pulse 70    Temp (!) 103.1 F (39.5 C) (Rectal)    Resp 16    Ht 5\' 4"  (1.626 m)    Wt 72.6 kg    SpO2 98%    BMI 27.46 kg/m   Physical Exam Vitals and nursing note reviewed.  Constitutional:      Appearance: She is well-developed.     Comments: Non toxic but patient appears uncomfortable. Rigors, which stop with movement, sitting up   HENT:     Head: Normocephalic and atraumatic.     Nose: Nose normal.  Eyes:     Conjunctiva/sclera: Conjunctivae normal.  Cardiovascular:     Rate and Rhythm: Normal rate and regular rhythm.     Comments: 1+ radial and DP pulses bilaterally. No abdominal pulsatility.  Pulmonary:     Effort: Pulmonary effort is normal.     Breath sounds: Normal breath sounds.  Abdominal:     General: Bowel sounds are normal.     Palpations: Abdomen is soft.     Tenderness: There is no abdominal tenderness.     Comments: No G/R/R. No suprapubic or CVA tenderness. Negative Murphy's and McBurney's. Active BS to lower quadrants.   Musculoskeletal:         General: Normal range of motion.     Cervical back: Normal range of motion.     Comments: Patient reports "soreness" in right low back/upper buttock.  No reproducible lumbar midline/paraspinal muscular tenderness. Normal skin over back, no contusion or ecchymosis  Skin:    General: Skin is warm and dry.     Capillary Refill: Capillary refill takes less than 2 seconds.  Neurological:     Mental Status: She is alert.     Comments: Sensation and strength intact in upper/lower extremities. Patient  able to sit up, lift both legs and hold them above bed without worsening low back pain or deficit. Negative SLR bilaterally   Psychiatric:        Behavior: Behavior normal.     ED Results / Procedures / Treatments   Labs (all labs ordered are listed, but only abnormal results are displayed) Labs Reviewed  CBC WITH DIFFERENTIAL/PLATELET - Abnormal; Notable for the following components:      Result Value   WBC 16.8 (*)    Hemoglobin 11.7 (*)    Neutro Abs 14.6 (*)    Abs Immature Granulocytes 0.31 (*)    All other components within normal limits  COMPREHENSIVE METABOLIC PANEL - Abnormal; Notable for the following components:   Glucose, Bld 123 (*)    Creatinine, Ser 1.11 (*)    Calcium 8.6 (*)    Total Protein 6.1 (*)    Albumin 2.8 (*)    GFR calc non Af Amer 51 (*)    GFR calc Af Amer 59 (*)    All other components within normal limits  URINALYSIS, ROUTINE W REFLEX MICROSCOPIC - Abnormal; Notable for the following components:   APPearance HAZY (*)    Hgb urine dipstick LARGE (*)    All other components within normal limits  URINE CULTURE  CULTURE, BLOOD (ROUTINE X 2)  CULTURE, BLOOD (ROUTINE X 2)  LACTIC ACID, PLASMA  APTT  PROTIME-INR  LACTIC ACID, PLASMA    EKG None  Radiology CT RENAL STONE STUDY  Result Date: 07/29/2019 CLINICAL DATA:  Flank pain with hematuria EXAM: CT ABDOMEN AND PELVIS WITHOUT CONTRAST TECHNIQUE: Multidetector CT imaging of the abdomen and pelvis  was performed following the standard protocol without oral or IV contrast. COMPARISON:  None. FINDINGS: Lower chest: There is a left pleural effusion with consolidation in the left base. There is atelectatic change in the lateral right base. Hepatobiliary: There is a small granuloma in the right lobe of the liver. No other focal liver lesions are identified on this noncontrast enhanced study. The gallbladder wall is not appreciably thickened. There is no biliary duct dilatation. Pancreas: There is no pancreatic mass or inflammatory focus. Spleen: No splenic lesions are evident. Adrenals/Urinary Tract: The right adrenal appears normal. 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 There is perinephric stranding and perinephric fluid in the right perirenal/perinephric fascia region. There is a cyst arising from the lower pole of the right kidney measuring 6.0 x 5.5 cm. There is moderate hydronephrosis on the right. There is no hydronephrosis on the left. There is increased attenuation throughout the fluid in the right collecting system. There is edema tracking along portions of the right ureter. No ureteral calculus is evident on either side, however. Urinary bladder is midline with wall thickness within normal limits. Stomach/Bowel: There is no appreciable bowel wall or mesenteric thickening. There are multiple descending colonic and sigmoid diverticula without diverticulitis. There is no evident bowel obstruction. The terminal ileum appears normal. There is no evident free air or portal venous air. Vascular/Lymphatic: There is no abdominal aortic aneurysm. There is aortic and iliac artery atherosclerosis. There is no appreciable adenopathy in the abdomen or pelvis. Reproductive: The uterus is absent.  No pelvic mass evident. Other: No periappendiceal region inflammation evident. No appreciable abscess or ascites in the abdomen or pelvis. There is a small umbilical hernia containing only  fat. No bowel containing hernia evident. Musculoskeletal: There is degenerative change throughout the lumbar region. There  is slight anterior wedging of the L1 vertebral body. There are no blastic or lytic bone lesions. There is no intramuscular lesion. IMPRESSION: 1. There is moderate hydronephrosis and proximal ureterectasis on the right. There is extensive soft tissue stranding and fluid in the right perinephric fascia region. Increased attenuation is noted in the fluid in the dilated right collecting system. No ureteral calculus is evident on the right. Question recent calculus passage on the right. Pyelonephritis may present in this manner and is a differential consideration given the appearance on the right. 2. Left pleural effusion with left base consolidation, likely pneumonia. Atelectasis lateral right base noted. 3. Multiple descending colonic and sigmoid diverticula without diverticulitis. No bowel wall thickening or bowel obstruction. No abscess in the abdomen or pelvis. No periappendiceal region inflammation. 4.  Uterus absent. 5.  Small benign left adrenal adenoma. 6.  Aortic Atherosclerosis (ICD10-I70.0). 7.  Small umbilical hernia containing fat but no bowel. These results will be called to the ordering clinician or representative by the Radiologist Assistant, and communication documented in the PACS or [employer]. Electronically Signed   By: Lowella Grip III M.D.   On: 07/29/2019 12:23    Procedures .Critical Care Performed by: Kinnie Feil, PA-C Authorized by: Kinnie Feil, PA-C   Critical care provider statement:    Critical care time (minutes):  45   Critical care was necessary to treat or prevent imminent or life-threatening deterioration of the following conditions:  Sepsis (SIRS refractory pain)   Critical care was time spent personally by me on the following activities:  Discussions with consultants, evaluation of patient's response to treatment, examination  of patient, ordering and performing treatments and interventions, ordering and review of laboratory studies, ordering and review of radiographic studies, pulse oximetry, re-evaluation of patient's condition, obtaining history from patient or surrogate and review of old charts   I assumed direction of critical care for this patient from another provider in my specialty: no     (including critical care time)  Medications Ordered in ED Medications  cefTRIAXone (ROCEPHIN) 2 g in sodium chloride 0.9 % 100 mL IVPB (2 g Intravenous New Bag/Given 07/30/19 1640)  fentaNYL (SUBLIMAZE) injection 100 mcg (100 mcg Intravenous Given 07/30/19 1242)  ondansetron (ZOFRAN) injection 4 mg (4 mg Intravenous Given 07/30/19 1242)  sodium chloride 0.9 % bolus 1,000 mL (1,000 mLs Intravenous New Bag/Given 07/30/19 1620)  acetaminophen (TYLENOL) tablet 1,000 mg (1,000 mg Oral Given 07/30/19 1635)  fentaNYL (SUBLIMAZE) injection 100 mcg (100 mcg Intravenous Given 07/30/19 1635)    ED Course  I have reviewed the triage vital signs and the nursing notes.  Pertinent labs & imaging results that were available during my care of the patient were reviewed by me and considered in my medical decision making (see chart for details).  Clinical Course as of Jul 30 1647  Fri Jul 30, 2019  1246 Creatinine(!): 1.11 [CG]  1346 WBC(!): 16.8 [CG]  1347 Hgb urine dipstick(!): LARGE [CG]  1347 RBC / HPF: 6-10 [CG]  1347 WBC, UA: 0-5 [CG]  1347 Bacteria, UA: NONE SEEN [CG]  1347 Squamous Epithelial / LPF: 0-5 [CG]  1347 Mucus: PRESENT [CG]  1347 Lactic Acid, Venous: 1.0 [CG]  9563 Spoke to radiologist Dr Jasmine December who read CT renal yesterday.  States images most classically look like pyelonephritis, some fluid in the kidney could be blood or pus from infection recomends urology consult.    [CG]  8756 Spoke to Dr Tresa Moore with urology who has  reviewed patient's CT scan. Agrees clinical picture most likely from pyelonephritis given recent urine  culture. States CT renal shows there is blood and patient likely having pain from passing blood clots, like a kidney stone.  Recommends to treat as pyelo and pain control similar to kidney stone pain.  Patient does not need surgery or admission from urological stand point. Consider admit if pain uncontrollable.     [CG]  1456 Spoke to [place] who states keflex is appropriate for group B strep grew on culture 7/6   [CG]    Clinical Course User Index [CG] Arlean Hopping   MDM Rules/Calculators/A&P                          I reviewed patient's available medical records, triage nursing notes.  Seen in the ED for gross hematuria and 7/6, discharged with Keflex.  Seen by PCP yesterday and CT scan obtained shows findings consistent with either pyelonephritis or recently passed kidney stone.  Urine culture grew 60 K of group B strep.  On exam she is nontoxic but appears uncomfortable, afebrile.  No tachycardia.  No neuro pulse deficits distally.  I cannot reproduce CVA tenderness or abdominal tenderness.  No pain.  I order lab work, urinalysis and urine culture.  I ordered fentanyl, Zofran for pain control.  Patient's pain initially well controlled but pain returned, severe.  I have spoken with radiologist read CT scan yesterday as well as urology, see above.  High suspicion for infectious process.    Rectal temp obtained, fever of 103.  She meets sirs criteria with fever, leukocytosis that is trending upwards.  Hemodynamically stable.  Urinalysis today shows some possible improvement after 3 days of Keflex.  Given her recent urine culture, this is likely the source  Given patient's refractory pain, SIRS criteria, will speak to hospitalist/IM for admission for UTI/SIRS.  I have ordered IV fluids, ceftriazone okay'ed by pharm, fentanyl and Tylenol.  Discussed with EDP. Final Clinical Impression(s) / ED Diagnoses Final diagnoses:  SIRS (systemic inflammatory response  syndrome) [facility])    Rx / DC Orders ED Discharge Orders    None       Arlean Hopping 07/30/19 1649    Milton Ferguson, MD 08/02/19 1006

## 2019-07-30 NOTE — Telephone Encounter (Signed)
Agree with assessment that they need to stay in the ER for full assessment

## 2019-07-30 NOTE — Telephone Encounter (Signed)
TC to patient, spoke with daughter, Hilda Blades, she states her mother (patient) is "worse than yesterday".  Patient heard in background and states she is in severe back pain which radiates to her abdomen, she is nauseated, no vomiting, reports positive hematuria, states she is very SOB and unable to take a deep breath".  RN asked if patient has any chest discomfort, patient states she is unable to tell if she has chest pain or pressure.  RN advised patient's daughter with above symptoms/patient hx, to call 911 for ER  Evaluation.  Daughter states she wants a guarantee that patient will not have to wait for a long time in ER.  This RN explained to daughter there is no guarantee how long patient will have to wait, that ER will triage patient and all patients are seen according to urgency.  Daughter verbalized understanding and is in agreement.  Patient was seen by Dr. Truman Hayward yesterday, he was notified of call this morning. SChaplin, RN,BSN

## 2019-07-31 DIAGNOSIS — R310 Gross hematuria: Secondary | ICD-10-CM | POA: Diagnosis present

## 2019-07-31 LAB — URINE CULTURE: Culture: NO GROWTH

## 2019-07-31 LAB — HEMOGLOBIN AND HEMATOCRIT, BLOOD
HCT: 29.6 % — ABNORMAL LOW (ref 36.0–46.0)
HCT: 29.8 % — ABNORMAL LOW (ref 36.0–46.0)
Hemoglobin: 9.2 g/dL — ABNORMAL LOW (ref 12.0–15.0)
Hemoglobin: 9.4 g/dL — ABNORMAL LOW (ref 12.0–15.0)

## 2019-07-31 LAB — CBC
HCT: 29.6 % — ABNORMAL LOW (ref 36.0–46.0)
Hemoglobin: 9.3 g/dL — ABNORMAL LOW (ref 12.0–15.0)
MCH: 29.8 pg (ref 26.0–34.0)
MCHC: 31.4 g/dL (ref 30.0–36.0)
MCV: 94.9 fL (ref 80.0–100.0)
Platelets: 318 10*3/uL (ref 150–400)
RBC: 3.12 MIL/uL — ABNORMAL LOW (ref 3.87–5.11)
RDW: 13.9 % (ref 11.5–15.5)
WBC: 12.7 10*3/uL — ABNORMAL HIGH (ref 4.0–10.5)
nRBC: 0 % (ref 0.0–0.2)

## 2019-07-31 LAB — COMPREHENSIVE METABOLIC PANEL
ALT: 29 U/L (ref 0–44)
AST: 16 U/L (ref 15–41)
Albumin: 2.1 g/dL — ABNORMAL LOW (ref 3.5–5.0)
Alkaline Phosphatase: 59 U/L (ref 38–126)
Anion gap: 7 (ref 5–15)
BUN: 12 mg/dL (ref 8–23)
CO2: 29 mmol/L (ref 22–32)
Calcium: 8.3 mg/dL — ABNORMAL LOW (ref 8.9–10.3)
Chloride: 105 mmol/L (ref 98–111)
Creatinine, Ser: 0.92 mg/dL (ref 0.44–1.00)
GFR calc Af Amer: >60 mL/min (ref >60)
GFR calc non Af Amer: >60 mL/min (ref >60)
Glucose, Bld: 86 mg/dL (ref 70–99)
Potassium: 3.9 mmol/L (ref 3.5–5.1)
Sodium: 141 mmol/L (ref 135–145)
Total Bilirubin: 0.3 mg/dL (ref 0.3–1.2)
Total Protein: 5.1 g/dL — ABNORMAL LOW (ref 6.5–8.1)

## 2019-07-31 LAB — RETICULOCYTES
Immature Retic Fract: 15.8 % (ref 2.3–15.9)
RBC.: 3.08 MIL/uL — ABNORMAL LOW (ref 3.87–5.11)
Retic Count, Absolute: 58.5 10*3/uL (ref 19.0–186.0)
Retic Ct Pct: 1.9 % (ref 0.4–3.1)

## 2019-07-31 LAB — LACTATE DEHYDROGENASE: LDH: 179 U/L (ref 98–192)

## 2019-07-31 LAB — PROTIME-INR
INR: 1.2 (ref 0.8–1.2)
Prothrombin Time: 14.3 seconds (ref 11.4–15.2)

## 2019-07-31 MED ORDER — WARFARIN SODIUM 5 MG PO TABS
5.0000 mg | ORAL_TABLET | Freq: Once | ORAL | Status: AC
  Administered 2019-07-31: 5 mg via ORAL
  Filled 2019-07-31: qty 1

## 2019-07-31 MED ORDER — ENOXAPARIN SODIUM 80 MG/0.8ML ~~LOC~~ SOLN
75.0000 mg | Freq: Two times a day (BID) | SUBCUTANEOUS | Status: DC
  Administered 2019-07-31 – 2019-08-05 (×10): 75 mg via SUBCUTANEOUS
  Filled 2019-07-31 (×10): qty 0.8

## 2019-07-31 MED ORDER — PANTOPRAZOLE SODIUM 40 MG IV SOLR
40.0000 mg | Freq: Once | INTRAVENOUS | Status: AC
  Administered 2019-07-31: 40 mg via INTRAVENOUS
  Filled 2019-07-31: qty 40

## 2019-07-31 MED ORDER — PANTOPRAZOLE SODIUM 40 MG IV SOLR
40.0000 mg | INTRAVENOUS | Status: DC
  Administered 2019-07-31 – 2019-08-01 (×2): 40 mg via INTRAVENOUS
  Filled 2019-07-31 (×2): qty 40

## 2019-07-31 MED ORDER — PANTOPRAZOLE SODIUM 40 MG IV SOLR: 40.0000 mg | Freq: Once | INTRAVENOUS | Status: DC

## 2019-07-31 NOTE — Progress Notes (Addendum)
Subjective: Ms. Vanorman reports that she feels better this morning. Her pain has significantly improved. She has been on supplemental oxygen 2-3L since she was diagnosed with PE in June. She usually does not use her supplemental oxygen all the time. When she takes it off, her O2 saturation is in the high 80s and mid 90s. Patient states that her urine last night was dark. She denies dysuria.   Objective:  Vital signs in last 24 hours: Vitals:   07/30/19 1841 07/30/19 2259 07/31/19 0240 07/31/19 0624  BP: 124/67 119/71 111/64 121/62  Pulse: 82 73 73 68  Resp: 18 20 20 16   Temp: 99 F (37.2 C) 99.5 F (37.5 C) 99.7 F (37.6 C) 99 F (37.2 C)  TempSrc: Oral Oral Oral Oral  SpO2: 95% 96% 96% 96%  Weight:  75.4 kg    Height:        Physical Exam  Physical Exam Constitutional:      General: She is not in acute distress.    Appearance: She is not toxic-appearing.  HENT:     Head: Normocephalic.  Eyes:     General: No scleral icterus. Cardiovascular:     Rate and Rhythm: Normal rate and regular rhythm.     Heart sounds: Normal heart sounds.  Pulmonary:     Effort: Pulmonary effort is normal. No respiratory distress.     Comments: Crackles and diminished breath sound at lower left lung field Abdominal:     General: Bowel sounds are normal.     Palpations: Abdomen is soft.     Tenderness: There is no abdominal tenderness.  Musculoskeletal:     Cervical back: Normal range of motion.     Right lower leg: No edema.     Left lower leg: No edema.  Skin:    General: Skin is warm.  Neurological:     Mental Status: She is alert.  Psychiatric:        Mood and Affect: Mood normal.     Assessment/Plan: Patient is a 69 yo female with history of PE on Lovenox, A-fib, HTN, who presents to the ER for Right low back pain, associated with hematuria, nausea and vomiting. Currently treating for Pyelonephritis.   Principal Problem:   Pyelonephritis Active Problems:   Hypertension    Emphysema, unspecified ([place])   Pulmonary embolism (HCC)   Pleural effusion on left  Pyelonephritis Patient presenting with Right CVA tenderness and hematuria, which suggest renal calculi. CT renal stone study reveals no evidence of nephrolithiasis.There is perinephric stranding and perinephric fluid which can suggest Pyelonephritis. Urine culture 07/28/19 grew Group B strep.UA negative for Nitrites and Leukocytes, likely because patient was taking Keflex at home. 2/4 SIRS criteria on admission (RR 21, Temp 103.1). She is admitted for treatment of urosepsis secondary to pyelonephritis.   - Continue Cetriaxone 1g (day 2) - BP and HR are within normal limits - Pending urine culture - Blood culture pending  - Dilaudid 0.5 mg Q6h PRN for pain - Zofran PRN for nausea   History of Pulmonary embolism Patient was in the ED in July 12 2019 for pulmonary embolism. Eliquis was started. Patient was seen again in the ED in 07/20/19. Patient was switched to Lovenox and planned to bridge to Warfarin. Baseline Hgb ~ 11  - Continue Lovenox and Warfarin bridging - Pharmacy follow INR - Daily CBC - If visible bleeding, will hold anticoagulants.   Anemia Baseline Hgb ~11. MCV 92.5. Differentials include blood loss from hematuria  or hemolysis secondary to infection.   - Hgb 11.7 - 10.2 - 9.4 - 9.2 - Pending reticulocytes count, LDH, and Haptoglobin.   - Patient has no obvious bleeding. Continue to monitor - PPI prophylasix - Continue Lovenox-Warfarin. Pharmacy is monitoring INR  Acute kidney injury - Baseline Creatine 0.8.  - Cr 1.11 -0.92. Almost resolved - Follow up BMP in AM   Pleural Effusion Left pleural effusion was present since 07/12/19. Differentials include neoplasm given smoking history. Crackle noted on at left lower lobe on physical exam. Patient is not fluid overload on exam.  - Monitor at the moment - Patient quit smoking recently. She is on 2 L of oxygen at home - Will follow up at the  Internal Medicine clinic with CXR and [place].   Hypertension  - Holding home med Lisinopril given current BP - Vitals daily    A-fib - Continue Metoprolol - Lovenox-Warfarin for anticoagulation   Emphysema disease - Patient has history of smoking. Quit recently - O2 sat 95% on 2L    Diet: regular IVF: N/A CODE: Full DVT: Lovenox-Warfarin  Prior to Admission Living Arrangement: home Anticipated Discharge Location: Home Barriers to Discharge: antibiotics Dispo: Anticipated discharge in approximately 2 day(s).   Gaylan Gerold, DO 07/31/2019, 7:58 AM Pager: [phone] After 5pm on weekdays and 1pm on weekends: On Call pager [phone]

## 2019-07-31 NOTE — Progress Notes (Signed)
Patient continues to have dark red urine. No dysuria per patient.

## 2019-07-31 NOTE — Progress Notes (Signed)
[facility] for Warfarin + Lovenox bridge  Indication: pulmonary embolus  No Known Allergies  Patient Measurements: Height: 5\' 4"  (162.6 cm) Weight: 75.4 kg (166 lb 3.6 oz) IBW/kg (Calculated) : 54.7  Vital Signs: Temp: 99 F (37.2 C) (07/10 0624) Temp Source: Oral (07/10 0624) BP: 121/62 (07/10 0624) Pulse Rate: 68 (07/10 0624)  Labs: Recent Labs    07/30/19 1242 07/30/19 1242 07/30/19 1606 07/30/19 2024 07/30/19 2024 07/30/19 2343 07/31/19 0639  HGB 11.7*   < >  --  10.2*   < > 9.4* 9.3*  HCT 36.9   < >  --  32.0*  --  29.8* 29.6*  PLT 399  --   --   --   --   --  318  APTT  --   --  33  --   --   --   --   LABPROT  --   --  13.3  --   --   --  14.3  INR  --   --  1.1  --   --   --  1.2  CREATININE 1.11*  --   --   --   --   --  0.92   < > = values in this interval not displayed.    Estimated Creatinine Clearance: 57.4 mL/min (by C-G formula based on SCr of 0.92 mg/dL).   Medical History: Past Medical History:  Diagnosis Date  . Hypertension     Medications:  Medications Prior to Admission  Medication Sig Dispense Refill Last Dose  . acetaminophen (TYLENOL) 500 MG tablet Take 1,000 mg by mouth as needed for moderate pain.    Past Month at Unknown time  . cephALEXin (KEFLEX) 500 MG capsule Take 1 capsule (500 mg total) by mouth 3 (three) times daily. 21 capsule 0 07/29/2019 at Unknown time  . enoxaparin (LOVENOX) 80 MG/0.8ML injection Inject 0.8 mLs (80 mg total) into the skin every 12 (twelve) hours. 48 mL 0 07/29/2019 at Unknown time  . lisinopril (ZESTRIL) 20 MG tablet TAKE 1 TABLET BY MOUTH EVERY DAY (Patient taking differently: Take 20 mg by mouth daily. ) 90 tablet 0 Past Week at Unknown time  . meloxicam (MOBIC) 15 MG tablet Take 1 tablet (15 mg total) by mouth daily. 30 tablet 0 07/30/2019 at Unknown time  . metoprolol tartrate (LOPRESSOR) 50 MG tablet Take 1 tablet (50 mg total) by mouth 2 (two) times daily. 60 tablet 0  07/29/2019 at 2115  . ondansetron (ZOFRAN) 4 MG tablet Take 1 tablet (4 mg total) by mouth daily as needed for nausea or vomiting. 30 tablet 0 07/30/2019 at Unknown time  . oxyCODONE (ROXICODONE) 5 MG immediate release tablet Take 1 tablet (5 mg total) by mouth every 4 (four) hours as needed for up to 5 days for severe pain. 20 tablet 0 07/30/2019 at Unknown time    Assessment: 23 YOF with h/o PE on Lovenox to start warfarin with Lovenox bridge. INR on admission is 1.1. Hgb low, Plt wnl.  INR today 1.2.   Goal of Therapy:  INR 2-3 Monitor platelets by anticoagulation protocol: Yes   Plan:  -Warfarin 5 mg x 1 dose today -Lovenox 1 mg/kg (75 mg) twice daily -Monitor CBC, daily INR, renal fx and s/s of bleeding   Claudina Lick, PharmD PGY1 Acute Care Pharmacy Resident 07/31/2019 8:45 AM  Please check [email or web address] for unit-specific pharmacy phone numbers.

## 2019-08-01 LAB — CBC
HCT: 30.9 % — ABNORMAL LOW (ref 36.0–46.0)
Hemoglobin: 9.7 g/dL — ABNORMAL LOW (ref 12.0–15.0)
MCH: 29.8 pg (ref 26.0–34.0)
MCHC: 31.4 g/dL (ref 30.0–36.0)
MCV: 95.1 fL (ref 80.0–100.0)
Platelets: 319 10*3/uL (ref 150–400)
RBC: 3.25 MIL/uL — ABNORMAL LOW (ref 3.87–5.11)
RDW: 13.7 % (ref 11.5–15.5)
WBC: 8.5 10*3/uL (ref 4.0–10.5)
nRBC: 0 % (ref 0.0–0.2)

## 2019-08-01 LAB — BASIC METABOLIC PANEL
Anion gap: 9 (ref 5–15)
BUN: 10 mg/dL (ref 8–23)
CO2: 26 mmol/L (ref 22–32)
Calcium: 8.3 mg/dL — ABNORMAL LOW (ref 8.9–10.3)
Chloride: 106 mmol/L (ref 98–111)
Creatinine, Ser: 0.83 mg/dL (ref 0.44–1.00)
GFR calc Af Amer: >60 mL/min (ref >60)
GFR calc non Af Amer: >60 mL/min (ref >60)
Glucose, Bld: 88 mg/dL (ref 70–99)
Potassium: 3.9 mmol/L (ref 3.5–5.1)
Sodium: 141 mmol/L (ref 135–145)

## 2019-08-01 LAB — PROTIME-INR
INR: 1.1 (ref 0.8–1.2)
Prothrombin Time: 14.1 seconds (ref 11.4–15.2)

## 2019-08-01 LAB — HAPTOGLOBIN: Haptoglobin: 246 mg/dL (ref 37–355)

## 2019-08-01 MED ORDER — SENNA 8.6 MG PO TABS
1.0000 | ORAL_TABLET | Freq: Every day | ORAL | Status: DC
  Administered 2019-08-01 – 2019-08-02 (×2): 8.6 mg via ORAL
  Filled 2019-08-01 (×2): qty 1

## 2019-08-01 MED ORDER — SODIUM CHLORIDE 0.9 % IV BOLUS
500.0000 mL | Freq: Once | INTRAVENOUS | Status: AC
  Administered 2019-08-01: 500 mL via INTRAVENOUS

## 2019-08-01 MED ORDER — LISINOPRIL 20 MG PO TABS
20.0000 mg | ORAL_TABLET | Freq: Every day | ORAL | Status: DC
  Administered 2019-08-01 – 2019-08-05 (×5): 20 mg via ORAL
  Filled 2019-08-01 (×5): qty 1

## 2019-08-01 MED ORDER — POLYETHYLENE GLYCOL 3350 17 G PO PACK
17.0000 g | PACK | Freq: Two times a day (BID) | ORAL | Status: DC
  Administered 2019-08-01 – 2019-08-05 (×6): 17 g via ORAL
  Filled 2019-08-01 (×7): qty 1

## 2019-08-01 MED ORDER — DOCUSATE SODIUM 100 MG PO CAPS
100.0000 mg | ORAL_CAPSULE | Freq: Every day | ORAL | Status: DC
  Administered 2019-08-01 – 2019-08-02 (×2): 100 mg via ORAL
  Filled 2019-08-01 (×2): qty 1

## 2019-08-01 MED ORDER — ACETAMINOPHEN 325 MG PO TABS
650.0000 mg | ORAL_TABLET | Freq: Four times a day (QID) | ORAL | Status: DC
  Administered 2019-08-01 – 2019-08-05 (×15): 650 mg via ORAL
  Filled 2019-08-01 (×15): qty 2

## 2019-08-01 NOTE — Progress Notes (Addendum)
Subjective: HD#2   Overnight: No acute events reported  Today, Kimberly Walters was examined at bedside with her daughter present.  She complained of right flank pain that happened intermittently during the night.  She states that when she is urinating, she does not experience her right flank pain however right after urination she would abruptly experience the right flank pain that radiates to her right groin.  She continues to see small streaks of blood clots in the toilet bowl.  She denies fevers, chills but has intermittent nausea.  She has been tolerating her diet without any difficulties.  Objective:  Vital signs in last 24 hours: Vitals:   07/31/19 0911 07/31/19 1709 07/31/19 2134 08/01/19 0430  BP: (!) 116/56 (!) 145/76 137/71 (!) 143/67  Pulse: 74 72 68 93  Resp: 18 18 20 18   Temp: 98.6 F (37 C) 99.1 F (37.3 C) 98.4 F (36.9 C) 99.3 F (37.4 C)  TempSrc: Oral Oral Oral Oral  SpO2: 95% 96% 92% 91%  Weight:   76.5 kg   Height:       Const: In moderate distress, lying comfortably in bed, conversational HEENT: Nasal cannula in place Resp:  SPO2 95% on room air CV: RRR, no murmurs, gallop, rub Abd: Bowel sounds present, nondistended, nontender to palpation Back: No CVA tenderness bilaterally   Assessment/Plan:  Principal Problem:   Pyelonephritis Active Problems:   Hypertension   Emphysema, unspecified (HCC)   Pulmonary embolism (HCC)   Anticoagulant-induced bleeding (HCC)   Pleural effusion on left   Gross hematuria  Patient is a 69 yo female with history of PE on Lovenox, A-fib, HTN, who presents to the ER for Right low back pain, associated with hematuria, nausea and vomiting.Currently treating for Pyelonephritis.   Right flank pain (pyelonephritis with component of visceral pain from passage of blood clot through ureters) Patient presenting with Right CVA tenderness and hematuria, which suggest renal calculi. CT renal stone study reveals noevidence of  nephrolithiasis.There isperinephric stranding and perinephric fluid which can suggest Pyelonephritis. Urine culture 07/28/19 grew Group B strep.UA negative for Nitrites and Leukocytes, likely because patient was taking Keflex at home.2/4SIRS criteria on admission (RR 21, Temp 103.1). She is admitted for treatment of urosepsis secondary to pyelonephritis.  Blood cultures and urine cultures so far have been unremarkable.  She has remained afebrile.  - Continue Cetriaxone 1g (day 3) with plan to transition to Keflex or cefdinir - Pain regimen: Tylenol 650 mg q6hrs, Dilaudid 0.5 mg q6hr for breakthrough - Zofran PRN for nausea   History of Pulmonary embolism Patient was in the ED in July 12 2019 for pulmonary embolism. Eliquis was started. Patient was seen again in the ED in 07/20/19.  She was initially started on Lovenox with bridge to warfarin however because there is no definitive objective underlying finding of a hypercoagulable state, will discontinue warfarin and treat her pulmonary embolism with low molecular weight heparin  - Continue Lovenox at 1 mg/kg - Daily CBC   Anemia Hematuria - Hgb stable his a.m. at 9.7 (baseline is approximately 10-11) - Follow-up daily CBC   Acute kidney injury-resolvedd - Follow up BMP in AM   Pleural Effusion Left pleural effusion was present since 07/12/19. Differentials include neoplasm given smoking history. Crackle noted on at left lower lobe on physical exam.   - Monitor at the moment - Patient quit smoking recently. She is on 2 L of oxygen at home - Will follow up at the Internal Medicine clinic  with CXR and [place].   Hypertension  - Resume lisinopril    A-fib - Continue Metoprolol - Continue Lovenox   Emphysema disease - Patient has history of smoking. Quit recently - Will require outpatient PFTs   Diet: regular IVF: N/A CODE: Full DVT: Lovenox  Prior to Admission Living Arrangement: home Anticipated Discharge  Location: Home Barriers to Discharge: antibiotics Dispo: Anticipated discharge in approximately 1 day(s).    Jean Rosenthal, MD 08/01/2019, 6:54 AM Pager: [phone] Internal Medicine Teaching Service After 5pm on weekdays and 1pm on weekends: On Call pager: [phone]

## 2019-08-01 NOTE — Progress Notes (Signed)
  Date: 08/01/2019  Patient name: Kimberly Walters  Medical record number: [id number]  Date of birth: 24-Sep-1950   I have seen and evaluated this patient and I have discussed the plan of care with the house staff. Please see their note for complete details. I concur with their findings with the following additions/corrections:   Had a rough night last night, feeling better today. Still having bouts of pain followed by gross hematuria. No fevers or dysuria. Still having some hemoptysis.   Discussed that her pain is likely related to passing clots from her kidney through her ureter. Needing pain meds to manage the pain, hopeful for improvement soon. Agreed that if she is not improving by tomorrow, will discuss again with urology for further recommendations.   Lenice Pressman, M.D., Ph.D. 08/01/2019, 3:50 PM

## 2019-08-01 NOTE — Progress Notes (Signed)
Patient had a large BM and states, "my pain is much better now"

## 2019-08-02 LAB — CBC
HCT: 32.7 % — ABNORMAL LOW (ref 36.0–46.0)
Hemoglobin: 10.5 g/dL — ABNORMAL LOW (ref 12.0–15.0)
MCH: 29.2 pg (ref 26.0–34.0)
MCHC: 32.1 g/dL (ref 30.0–36.0)
MCV: 91.1 fL (ref 80.0–100.0)
Platelets: 332 10*3/uL (ref 150–400)
RBC: 3.59 MIL/uL — ABNORMAL LOW (ref 3.87–5.11)
RDW: 13.2 % (ref 11.5–15.5)
WBC: 14.8 10*3/uL — ABNORMAL HIGH (ref 4.0–10.5)
nRBC: 0 % (ref 0.0–0.2)

## 2019-08-02 LAB — BASIC METABOLIC PANEL
Anion gap: 11 (ref 5–15)
BUN: 10 mg/dL (ref 8–23)
CO2: 28 mmol/L (ref 22–32)
Calcium: 8.8 mg/dL — ABNORMAL LOW (ref 8.9–10.3)
Chloride: 103 mmol/L (ref 98–111)
Creatinine, Ser: 1.1 mg/dL — ABNORMAL HIGH (ref 0.44–1.00)
GFR calc Af Amer: 59 mL/min — ABNORMAL LOW (ref >60)
GFR calc non Af Amer: 51 mL/min — ABNORMAL LOW (ref >60)
Glucose, Bld: 137 mg/dL — ABNORMAL HIGH (ref 70–99)
Potassium: 4.2 mmol/L (ref 3.5–5.1)
Sodium: 142 mmol/L (ref 135–145)

## 2019-08-02 LAB — PROTIME-INR
INR: 1.2 (ref 0.8–1.2)
Prothrombin Time: 14.9 seconds (ref 11.4–15.2)

## 2019-08-02 MED ORDER — KETOROLAC TROMETHAMINE 30 MG/ML IJ SOLN
30.0000 mg | Freq: Once | INTRAMUSCULAR | Status: AC | PRN
  Administered 2019-08-02: 30 mg via INTRAVENOUS
  Filled 2019-08-02: qty 1

## 2019-08-02 MED ORDER — SENNA 8.6 MG PO TABS
1.0000 | ORAL_TABLET | Freq: Two times a day (BID) | ORAL | Status: DC
  Administered 2019-08-02 – 2019-08-05 (×6): 8.6 mg via ORAL
  Filled 2019-08-02 (×6): qty 1

## 2019-08-02 MED ORDER — PANTOPRAZOLE SODIUM 40 MG PO TBEC
40.0000 mg | DELAYED_RELEASE_TABLET | Freq: Every day | ORAL | Status: DC
  Administered 2019-08-02 – 2019-08-04 (×3): 40 mg via ORAL
  Filled 2019-08-02 (×3): qty 1

## 2019-08-02 NOTE — Plan of Care (Signed)
  Problem: Health Behavior/Discharge Planning: Goal: Ability to manage health-related needs will improve Outcome: Completed/Met   Problem: Clinical Measurements: Goal: Ability to maintain clinical measurements within normal limits will improve Outcome: Completed/Met Goal: Will remain free from infection Outcome: Completed/Met Goal: Diagnostic test results will improve Outcome: Completed/Met Goal: Respiratory complications will improve Outcome: Completed/Met Goal: Cardiovascular complication will be avoided Outcome: Completed/Met   Problem: Activity: Goal: Risk for activity intolerance will decrease Outcome: Completed/Met   Problem: Nutrition: Goal: Adequate nutrition will be maintained Outcome: Completed/Met   Problem: Coping: Goal: Level of anxiety will decrease Outcome: Completed/Met   Problem: Elimination: Goal: Will not experience complications related to bowel motility Outcome: Completed/Met Goal: Will not experience complications related to urinary retention Outcome: Completed/Met   Problem: Skin Integrity: Goal: Risk for impaired skin integrity will decrease Outcome: Completed/Met   Problem: Urinary Elimination: Goal: Signs and symptoms of infection will decrease Outcome: Completed/Met

## 2019-08-02 NOTE — Progress Notes (Addendum)
Subjective:  Hospital day: 3  Overnight event: Patient experienced severe R flank plain last night. She was given 30 mg of Toradol and 0.5 mg of Dilaudid for breakthrough pain. She reports much improvement of pain after.   Patient examined at bedside and states that she feels well at this time. She states that she had a lot of flank pain last night, and notes that she woke with a lot of sweat on her bed. She states that she has an episode of dysuria earlier this smorning. Patient reports that she has a large BM this morning, and had pain relief afterwards. Pain is resolved at this time.  Objective:  Vital signs in last 24 hours: Vitals:   08/01/19 0923 08/01/19 1956 08/01/19 1957 08/02/19 0458  BP: (!) 143/76 (!) 170/83 (!) 170/83 136/62  Pulse: 75 77 77 73  Resp: 18 20 20 18   Temp: 99.2 F (37.3 C) 98.2 F (36.8 C) 98.2 F (36.8 C) 98.8 F (37.1 C)  TempSrc: Oral Oral Oral Oral  SpO2: 95% 90% 90% 96%  Weight:      Height:        Physical Exam Physical Exam Constitutional:      General: She is not in acute distress. HENT:     Head: Normocephalic.  Eyes:     General: No scleral icterus.    Conjunctiva/sclera: Conjunctivae normal.  Cardiovascular:     Rate and Rhythm: Normal rate and regular rhythm.     Heart sounds: Normal heart sounds.  Pulmonary:     Effort: Pulmonary effort is normal. No respiratory distress.     Breath sounds: Normal breath sounds.  Abdominal:     General: Bowel sounds are normal.     Palpations: Abdomen is soft.     Tenderness: There is no abdominal tenderness.  Musculoskeletal:     Cervical back: Normal range of motion.     Right lower leg: No edema.     Left lower leg: No edema.  Skin:    General: Skin is warm.     Coloration: Skin is not jaundiced.  Neurological:     Mental Status: She is alert.  Psychiatric:        Mood and Affect: Mood normal.     Assessment/Plan: Kimberly Walters is a 69 yo female with history of PE on Lovenox,  A-fib, HTN, who presents to the ER for Right low back pain, associated with hematuria, nausea and vomiting. Currently treating for Pyelonephritis.   Principal Problem:   Pyelonephritis Active Problems:   Hypertension   Emphysema, unspecified ([place])   Pulmonary embolism (HCC)   Anticoagulant-induced bleeding (HCC)   Pleural effusion on left   Gross hematuria  Right flank pain (pyelonephritis with component of visceral pain from passage of blood clot through ureters) Patient presenting with Right CVA tenderness and hematuria. CT renal stone study reveals no evidence of nephrolithiasis. There is hydronephrosis and perinephric stranding with renal cyst on the right. Urine culture 07/28/19 grew Group B strep. UA negative for Nitrites and Leukocytes, likely because patient was taking Keflex at home. 2/4 SIRS criteria on admission (RR 21, Temp 103.1). She is admitted for treatment of urosepsis secondary to pyelonephritis.  Blood cultures shows no growth on day 2.   - WBC elevated to 14.8, she has remained afebrile. Patient appears clinically stable.  - Continue Cetriaxone 1g (day 4) with plan to transition to Keflex or cefdinir - Pain regimen: Tylenol 650 mg q6hrs, Dilaudid 0.5 mg  q6hr for breakthrough.  - Zofran PRN for nausea - Patient still have intermittent flank pain that requires IV pain med.  - Continue bowel regimen Miralax and Senna to help relieve abdominal pressure from stool burden, especially when patient is on Opioid.  - Will order an UA if patient continues to have dysuria.      History of Pulmonary embolism Patient was in the ED in July 12 2019 for pulmonary embolism. Eliquis was started. Patient was seen again in the ED in 07/20/19.  She was initially started on Lovenox with bridge to warfarin however because there is no definitive objective underlying finding of a hypercoagulable state, will discontinue warfarin and treat her pulmonary embolism with low molecular weight heparin   -  Continue Lovenox at 1 mg/kg - Daily CBC - Hgb stable     Anemia Hematuria - Hgb stable his a.m. at 10.5 (baseline is approximately 10-11) - LDH, Haptoglobin normal. Reticulocytes low, suggesting hypoproliferation  - Follow-up daily CBC     Acute kidney injury - Creatine up to 1.10 today - Follow up BMP in AM     Pleural Effusion Left pleural effusion was present since 07/12/19. Differentials include neoplasm given smoking history. Crackle noted on at left lower lobe on physical exam.    - Monitor at the moment - Patient quit smoking recently. She is on 2 L of oxygen at home - Will follow up at the Internal Medicine clinic with CXR and [place].     Hypertension  - Resume lisinopril      A-fib - Continue Metoprolol - Continue Lovenox     Emphysema disease - Patient has history of smoking. Quit recently - Will require outpatient PFTs     Diet: regular IVF: N/A CODE: Full DVT: Lovenox   Prior to Admission Living Arrangement: home Anticipated Discharge Location: Home Barriers to Discharge: requiring IV pain medications Dispo: Anticipated discharge in approximately 1 day(s).     Gaylan Gerold, DO 08/02/2019, 6:12 AM Pager: [phone] After 5pm on weekdays and 1pm on weekends: On Call pager [phone]

## 2019-08-02 NOTE — Progress Notes (Signed)
Paged by RN for continued R flank pain. Dr. Myrtie Hawk and I went to room. Patient reports sharp, R flank pain has returned. She had temporary relief earlier this evening after 562mL bolus NS and large BM. On exam, HDS, R CVA tenderness. Explained current pain regimen and discussed different options for pain control with patient. Also encouraged patient that she is receiving appropriate antibiotics for pyelonephritis. Will give toradol 30mg , then dilaudid 0.5mg  for breakthrough pain.   [name], MD [phone]

## 2019-08-03 ENCOUNTER — Inpatient Hospital Stay (HOSPITAL_COMMUNITY): Payer: Medicare HMO

## 2019-08-03 LAB — URINALYSIS, ROUTINE W REFLEX MICROSCOPIC

## 2019-08-03 LAB — BASIC METABOLIC PANEL
Anion gap: 12 (ref 5–15)
BUN: 14 mg/dL (ref 8–23)
CO2: 27 mmol/L (ref 22–32)
Calcium: 8.5 mg/dL — ABNORMAL LOW (ref 8.9–10.3)
Chloride: 102 mmol/L (ref 98–111)
Creatinine, Ser: 1.09 mg/dL — ABNORMAL HIGH (ref 0.44–1.00)
GFR calc Af Amer: 60 mL/min — ABNORMAL LOW (ref >60)
GFR calc non Af Amer: 52 mL/min — ABNORMAL LOW (ref >60)
Glucose, Bld: 142 mg/dL — ABNORMAL HIGH (ref 70–99)
Potassium: 3.6 mmol/L (ref 3.5–5.1)
Sodium: 141 mmol/L (ref 135–145)

## 2019-08-03 LAB — CBC
HCT: 32.8 % — ABNORMAL LOW (ref 36.0–46.0)
Hemoglobin: 10.5 g/dL — ABNORMAL LOW (ref 12.0–15.0)
MCH: 29.9 pg (ref 26.0–34.0)
MCHC: 32 g/dL (ref 30.0–36.0)
MCV: 93.4 fL (ref 80.0–100.0)
Platelets: 340 10*3/uL (ref 150–400)
RBC: 3.51 MIL/uL — ABNORMAL LOW (ref 3.87–5.11)
RDW: 13.7 % (ref 11.5–15.5)
WBC: 16.4 10*3/uL — ABNORMAL HIGH (ref 4.0–10.5)
nRBC: 0 % (ref 0.0–0.2)

## 2019-08-03 LAB — URINALYSIS, MICROSCOPIC (REFLEX)
RBC / HPF: >50 RBC/hpf (ref 0–5)
Squamous Epithelial / HPF: NONE SEEN (ref 0–5)
WBC, UA: >50 WBC/hpf (ref 0–5)

## 2019-08-03 MED ORDER — HYDROMORPHONE HCL 1 MG/ML IJ SOLN: 0.5000 mg | Freq: Four times a day (QID) | INTRAMUSCULAR | Status: DC | PRN

## 2019-08-03 MED ORDER — TAMSULOSIN HCL 0.4 MG PO CAPS
0.4000 mg | ORAL_CAPSULE | Freq: Every day | ORAL | Status: DC
  Administered 2019-08-03 – 2019-08-05 (×3): 0.4 mg via ORAL
  Filled 2019-08-03 (×3): qty 1

## 2019-08-03 MED ORDER — KETOROLAC TROMETHAMINE 15 MG/ML IJ SOLN
15.0000 mg | Freq: Once | INTRAMUSCULAR | Status: AC
  Administered 2019-08-03: 15 mg via INTRAVENOUS
  Filled 2019-08-03: qty 1

## 2019-08-03 MED ORDER — OXYCODONE HCL 5 MG PO TABS
5.0000 mg | ORAL_TABLET | ORAL | Status: DC | PRN
  Administered 2019-08-03 – 2019-08-04 (×3): 5 mg via ORAL
  Filled 2019-08-03 (×3): qty 1

## 2019-08-03 NOTE — Progress Notes (Signed)
Patient continue to have hematuria. Dark red.

## 2019-08-03 NOTE — Progress Notes (Addendum)
   Subjective:   Hospital day: 4  Overnight event: episode of flank pain last night which required 2 doses of pain medication, similar to prior episodes  [name] was examined at bedside with her daughter present.  She does not report of any acute complaints at this time, but notes an episode of pain last night and is afraid of further episodes.  She also does endorse hematuria and showed the team her urine at bedside.  Objective:  Vital signs in last 24 hours: Vitals:   08/02/19 0849 08/02/19 1645 08/02/19 2029 08/03/19 0447  BP: (!) 147/80 130/71 131/72 (!) 147/83  Pulse: 73 75 74 84  Resp: 18 18 18 19   Temp: 98.1 F (36.7 C) 98.1 F (36.7 C) 98.4 F (36.9 C) 98.6 F (37 C)  TempSrc: Oral Oral Oral Oral  SpO2: 94% 91% 93% 90%  Weight:    75.2 kg  Height:        Physical Exam Constitutional: In no apparent distress, lying comfortably in bed and conversational Abdomen: The right side of her abdomen is tender to palpation though no evidence of CVA tenderness. Psych: Mood and affect unremarkable   Assessment/Plan: Kimberly Walters is a 69 yo female with history of PE on Lovenox, A-fib, HTN, who presents to the ER for Right low back pain, associated with hematuria, nausea and vomiting. Currently treating for Pyelonephritis and intractable pain.     Principal Problem:   Pyelonephritis Active Problems:   Hypertension   Emphysema, unspecified ([place])   Pulmonary embolism (HCC)   Anticoagulant-induced bleeding (HCC)   Pleural effusion on left   Gross hematuria    Right flank pain (pyelonephritis with component of visceral pain from passage of blood clot through ureters) CT abdomen performed today showed ongoing hemorrhage within the right renal collecting system and proximal ureter, similar right-sided hydroureteronephrosis.  No evidence of urinary tract calculi was identified.  Per initial conversation with urology, they recommend an outpatient ureteroscopy and continuation  of symptomatic management.   - Continue Cetriaxone 1g (day 5) with plan to transition to Keflex or cefdinir at discharge - Pain regimen: Tylenol 650 mg q6hrs, Dilaudid 0.5 mg q6hr for breakthrough.  Intermittent doses of Toradol to be used judiciously - Zofran PRN for nausea.  - Continue bowel regimen Miralax and Senna  - Follow-up UA - Follow-up urology recommendations     History of Pulmonary embolism - Continue Lovenox at 1 mg/kg - Daily CBC - Hgb stable     Anemia Hematuria - Hemoglobin stable - Follow-up daily CBC     Acute kidney injury-resolved - Follow up BMP in AM     Pleural Effusion Left pleural effusion was present since 07/12/19. Differentials include neoplasm given smoking history.  SPO2 unremarkable   - Monitor at the moment - Will follow up at the Internal Medicine clinic with CXR and [place].     Hypertension  - Continue lisinopril      A-fib - Continue Metoprolol - Continue Lovenox     Emphysema disease - Patient has history of smoking. Quit recently - Will require outpatient PFTs     Diet: regular IVF: N/A CODE: Full DVT: Lovenox   Prior to Admission Living Arrangement: home Anticipated Discharge Location: Home Barriers to Discharge: requiring IV pain medications Dispo: Anticipated discharge in approximately 1-2 day(s).     Gaylan Gerold, DO 08/03/2019, 7:46 AM Pager: [phone] After 5pm on weekdays and 1pm on weekends: On Call pager [phone]

## 2019-08-03 NOTE — Consult Note (Signed)
H&P Physician requesting consult: Lenice Pressman  Chief Complaint: Right flank pain  History of Present Illness: 69 year old female currently admitted with right-sided flank pain and a left-sided pleural effusion consistent with atelectasis versus pneumonia.  She has been on ceftriaxone.  The patient has a recent history of bilateral pulmonary emboli.  She is currently on Lovenox 75 mg subcutaneous every 12 hours.  For the past 2 weeks, the patient has experienced gross hematuria.  She presented with severe right-sided flank pain.  She initially underwent CT renal stone protocol on 07/29/2019 that revealed right-sided perirenal/perinephric stranding with a cyst arising from the lower pole of the right kidney measuring 6 cm.  There was moderate hydronephrosis with increased attenuation suggesting blood product in the right collecting system.  No ureteral calculus was seen.  She was having ongoing right-sided flank pain that was intermittent consistent with renal colic and therefore underwent a repeat CT scan today that again showed hyperattenuating material consistent with hemorrhage within the right collecting system and proximal ureter with moderate right hydronephrosis.  Her perinephric edema was less.  Currently, creatinine is normal at 1.09.  Hemoglobin has been stable.  She does have leukocytosis of 16.4.  She is currently afebrile with vital signs stable.  She did have a T-max of 100 earlier.  Last febrile episode was on 07/30/2019 and her temperature was 103.  She states that currently her pain is controlled.  She is not having severe pain at this time.  She is voiding well though she does have ongoing hematuria.  Past Medical History:  Diagnosis Date  . Hypertension    Past Surgical History:  Procedure Laterality Date  . HYSTERECTOMY ABDOMINAL WITH SALPINGO-OOPHORECTOMY  1975    Home Medications:  Medications Prior to Admission  Medication Sig Dispense Refill Last Dose  . acetaminophen  (TYLENOL) 500 MG tablet Take 1,000 mg by mouth as needed for moderate pain.    Past Month at Unknown time  . cephALEXin (KEFLEX) 500 MG capsule Take 1 capsule (500 mg total) by mouth 3 (three) times daily. 21 capsule 0 07/29/2019 at Unknown time  . enoxaparin (LOVENOX) 80 MG/0.8ML injection Inject 0.8 mLs (80 mg total) into the skin every 12 (twelve) hours. 48 mL 0 07/29/2019 at Unknown time  . lisinopril (ZESTRIL) 20 MG tablet TAKE 1 TABLET BY MOUTH EVERY DAY (Patient taking differently: Take 20 mg by mouth daily. ) 90 tablet 0 Past Week at Unknown time  . meloxicam (MOBIC) 15 MG tablet Take 1 tablet (15 mg total) by mouth daily. 30 tablet 0 07/30/2019 at Unknown time  . metoprolol tartrate (LOPRESSOR) 50 MG tablet Take 1 tablet (50 mg total) by mouth 2 (two) times daily. 60 tablet 0 07/29/2019 at 2115  . ondansetron (ZOFRAN) 4 MG tablet Take 1 tablet (4 mg total) by mouth daily as needed for nausea or vomiting. 30 tablet 0 07/30/2019 at Unknown time  . oxyCODONE (ROXICODONE) 5 MG immediate release tablet Take 1 tablet (5 mg total) by mouth every 4 (four) hours as needed for up to 5 days for severe pain. 20 tablet 0 07/30/2019 at Unknown time   Allergies: No Known Allergies  Family History  Problem Relation Age of Onset  . Diabetes Mother   . Diabetes Father   . CAD Father   . Hypothyroidism Sister   . Melanoma Sister   . Ovarian cancer Other   . Uterine cancer Other   . Breast cancer Other   . Diabetes Son   .  Uterine cancer Niece   . Pancreatic cancer Nephew    Social History:  reports that she quit smoking about 4 weeks ago. Her smoking use included cigarettes. She smoked 0.50 packs per day. She has never used smokeless tobacco. She reports that she does not drink alcohol and does not use drugs.  ROS: A complete review of systems was performed.  All systems are negative except for pertinent findings as noted. ROS   Physical Exam:  Vital signs in last 24 hours: Temp:  [97.5 F (36.4 C)-100  F (37.8 C)] 100 F (37.8 C) (07/13 1629) Pulse Rate:  [84-105] 87 (07/13 1629) Resp:  [18-20] 18 (07/13 1629) BP: (146-178)/(74-90) 153/74 (07/13 1629) SpO2:  [90 %-96 %] 96 % (07/13 1629) Weight:  [75.2 kg] 75.2 kg (07/13 0447) General:  Alert and oriented, No acute distress HEENT: Normocephalic, atraumatic Neck: No JVD or lymphadenopathy Cardiovascular: Regular rate and rhythm Lungs: Regular rate and effort Abdomen: Soft, nontender, nondistended, no abdominal masses Back: No CVA tenderness Extremities: No edema Neurologic: Grossly intact  Laboratory Data:  Results for orders placed or performed during the hospital encounter of 07/30/19 (from the past 24 hour(s))  CBC     Status: Abnormal   Collection Time: 08/03/19  6:10 AM  Result Value Ref Range   WBC 16.4 (H) 4.0 - 10.5 K/uL   RBC 3.51 (L) 3.87 - 5.11 MIL/uL   Hemoglobin 10.5 (L) 12.0 - 15.0 g/dL   HCT 32.8 (L) 36 - 46 %   MCV 93.4 80.0 - 100.0 fL   MCH 29.9 26.0 - 34.0 pg   MCHC 32.0 30.0 - 36.0 g/dL   RDW 13.7 11.5 - 15.5 %   Platelets 340 150 - 400 K/uL   nRBC 0.0 0.0 - 0.2 %  Basic metabolic panel     Status: Abnormal   Collection Time: 08/03/19  6:10 AM  Result Value Ref Range   Sodium 141 135 - 145 mmol/L   Potassium 3.6 3.5 - 5.1 mmol/L   Chloride 102 98 - 111 mmol/L   CO2 27 22 - 32 mmol/L   Glucose, Bld 142 (H) 70 - 99 mg/dL   BUN 14 8 - 23 mg/dL   Creatinine, Ser 1.09 (H) 0.44 - 1.00 mg/dL   Calcium 8.5 (L) 8.9 - 10.3 mg/dL   GFR calc non Af Amer 52 (L) >60 mL/min   GFR calc Af Amer 60 (L) >60 mL/min   Anion gap 12 5 - 15  Urinalysis, Routine w reflex microscopic     Status: Abnormal   Collection Time: 08/03/19  4:44 PM  Result Value Ref Range   Color, Urine RED (A) YELLOW   APPearance TURBID (A) CLEAR   Specific Gravity, Urine  1.005 - 1.030    TEST NOT REPORTED DUE TO COLOR INTERFERENCE OF URINE PIGMENT   pH  5.0 - 8.0    TEST NOT REPORTED DUE TO COLOR INTERFERENCE OF URINE PIGMENT   Glucose,  UA (A) NEGATIVE mg/dL    TEST NOT REPORTED DUE TO COLOR INTERFERENCE OF URINE PIGMENT   Hgb urine dipstick (A) NEGATIVE    TEST NOT REPORTED DUE TO COLOR INTERFERENCE OF URINE PIGMENT   Bilirubin Urine (A) NEGATIVE    TEST NOT REPORTED DUE TO COLOR INTERFERENCE OF URINE PIGMENT   Ketones, ur (A) NEGATIVE mg/dL    TEST NOT REPORTED DUE TO COLOR INTERFERENCE OF URINE PIGMENT   Protein, ur (A) NEGATIVE mg/dL    TEST NOT REPORTED  DUE TO COLOR INTERFERENCE OF URINE PIGMENT   Nitrite (A) NEGATIVE    TEST NOT REPORTED DUE TO COLOR INTERFERENCE OF URINE PIGMENT   Leukocytes,Ua (A) NEGATIVE    TEST NOT REPORTED DUE TO COLOR INTERFERENCE OF URINE PIGMENT  Urinalysis, Microscopic (reflex)     Status: Abnormal   Collection Time: 08/03/19  4:44 PM  Result Value Ref Range   RBC / HPF >50 0 - 5 RBC/hpf   WBC, UA >50 0 - 5 WBC/hpf   Bacteria, UA MANY (A) NONE SEEN   Squamous Epithelial / LPF NONE SEEN 0 - 5   Urine-Other MICROSCOPIC EXAM PERFORMED ON UNCONCENTRATED URINE    Recent Results (from the past 240 hour(s))  Urine culture     Status: Abnormal   Collection Time: 07/28/19  1:56 AM   Specimen: Urine, Random  Result Value Ref Range Status   Specimen Description URINE, RANDOM  Final   Special Requests NONE  Final   Culture (A)  Final    60,000 COLONIES/mL GROUP B STREP(S.AGALACTIAE)ISOLATED TESTING AGAINST S. AGALACTIAE NOT ROUTINELY PERFORMED DUE TO PREDICTABILITY OF AMP/PEN/VAN SUSCEPTIBILITY. Performed at [facility], [place] [address] [place], [place] [address]    Report Status 07/29/2019 FINAL  Final  Urine culture     Status: None   Collection Time: 07/30/19 12:42 PM   Specimen: Urine, Random  Result Value Ref Range Status   Specimen Description URINE, RANDOM  Final   Special Requests NONE  Final   Culture   Final    NO GROWTH Performed at [facility], [place] [address] [place], [place] [address]    Report Status 07/31/2019 FINAL  Final  Blood Culture (routine x 2)      Status: None (Preliminary result)   Collection Time: 07/30/19  4:06 PM   Specimen: BLOOD RIGHT HAND  Result Value Ref Range Status   Specimen Description BLOOD RIGHT HAND  Final   Special Requests   Final    BOTTLES DRAWN AEROBIC AND ANAEROBIC Blood Culture results may not be optimal due to an inadequate volume of blood received in culture bottles   Culture   Final    NO GROWTH 4 DAYS Performed at [facility], [place] [address], [place], [place] [address]    Report Status PENDING  Incomplete  Blood Culture (routine x 2)     Status: None (Preliminary result)   Collection Time: 07/30/19  4:06 PM   Specimen: BLOOD LEFT FOREARM  Result Value Ref Range Status   Specimen Description BLOOD LEFT FOREARM  Final   Special Requests   Final    BOTTLES DRAWN AEROBIC AND ANAEROBIC Blood Culture results may not be optimal due to an inadequate volume of blood received in culture bottles   Culture   Final    NO GROWTH 4 DAYS Performed at [facility], [place] [address] [place], [place] [address]    Report Status PENDING  Incomplete  SARS Coronavirus 2 by RT PCR (hospital order, performed in [facility] hospital lab) Nasopharyngeal Nasopharyngeal Swab     Status: None   Collection Time: 07/30/19  5:47 PM   Specimen: Nasopharyngeal Swab  Result Value Ref Range Status   SARS Coronavirus 2 NEGATIVE NEGATIVE Final    Comment: (NOTE) SARS-CoV-2 target nucleic acids are NOT DETECTED.  The SARS-CoV-2 RNA is generally detectable in upper and lower respiratory specimens during the acute phase of infection. The lowest concentration of SARS-CoV-2 viral copies  this assay can detect is 250 copies / mL. A negative result does not preclude SARS-CoV-2 infection and should not be used as the sole basis for treatment or other patient management decisions.  A negative result may occur with improper specimen collection / handling, submission of specimen other than nasopharyngeal swab, presence of viral  mutation(s) within the areas targeted by this assay, and inadequate number of viral copies (<250 copies / mL). A negative result must be combined with clinical observations, patient history, and epidemiological information.  Fact Sheet for Patients:   StrictlyIdeas.no  Fact Sheet for Healthcare Providers: BankingDealers.co.za  This test is not yet approved or  cleared by the [place] FDA and has been authorized for detection and/or diagnosis of SARS-CoV-2 by FDA under an Emergency Use Authorization (EUA).  This EUA will remain in effect (meaning this test can be used) for the duration of the COVID-19 declaration under Section 564(b)(1) of the Act, 21 U.S.C. section 360bbb-3(b)(1), unless the authorization is terminated or revoked sooner.  Performed at [facility], [place] [address] [place],  [address]    Creatinine: Recent Labs    07/27/19 2233 07/30/19 1242 07/31/19 0639 08/01/19 0509 08/02/19 0716 08/03/19 0610  CREATININE 0.81 1.11* 0.92 0.83 1.10* 1.09*   CT scan personally reviewed and is detailed illness  Impression/Assessment:  Gross hematuria Right hydronephrosis Right renal colic  Plan:  We will add Flomax to hopefully help with ureteral relaxation and decreased renal colic.  I will also add oral opioid agents as she is not currently prescribed any on a as needed basis.  Ideally would stop blood thinners given the ongoing hematuria but understand the fact that she recently had pulmonary embolus less than a month ago.  She will ultimately need a diagnostic cystoscopy with diagnostic right ureteroscopy, possible biopsy, right ureteral stent placement.  Etiology of hematuria is unknown at this time.  Is obviously from the upper tract in the kidney but may be from spontaneous bleed such as a bleed from the cyst versus a urothelial tumor.  Only way to distinguish would be direct visualization with  ureteroscopy.  If her pain continues to be not well controlled or if her leukocytosis continues to worsen despite antibiotics, may ultimately require temporization with the stent.  However, we will try to avoid this as there is a potential that it can cause increased pain or discomfort and could possibly cause the stent to clot off and cause more problems.  I ordered oxycodone 5 mg as needed for pain and tamsulosin.   Marton Redwood, III 08/03/2019, 8:48 PM

## 2019-08-03 NOTE — Progress Notes (Signed)
ANTICOAGULATION CONSULT NOTE  Pharmacy Consult for Lovenox Indication: pulmonary embolus  No Known Allergies  Patient Measurements: Height: 5\' 4"  (162.6 cm) Weight: 75.2 kg (165 lb 12.6 oz) IBW/kg (Calculated) : 54.7  Vital Signs: Temp: 98.6 F (37 C) (07/13 0904) Temp Source: Oral (07/13 0904) BP: 146/80 (07/13 0904) Pulse Rate: 85 (07/13 0904)  Labs: Recent Labs    08/01/19 0509 08/01/19 0509 08/02/19 0402 08/02/19 0716 08/03/19 0610  HGB 9.7*   < >  --  10.5* 10.5*  HCT 30.9*  --   --  32.7* 32.8*  PLT 319  --   --  332 340  LABPROT 14.1  --  14.9  --   --   INR 1.1  --  1.2  --   --   CREATININE 0.83  --   --  1.10* 1.09*   < > = values in this interval not displayed.    Estimated Creatinine Clearance: 48.4 mL/min (A) (by C-G formula based on SCr of 1.09 mg/dL (H)).   Medical History: Past Medical History:  Diagnosis Date  . Hypertension     Medications:  Medications Prior to Admission  Medication Sig Dispense Refill Last Dose  . acetaminophen (TYLENOL) 500 MG tablet Take 1,000 mg by mouth as needed for moderate pain.    Past Month at Unknown time  . cephALEXin (KEFLEX) 500 MG capsule Take 1 capsule (500 mg total) by mouth 3 (three) times daily. 21 capsule 0 07/29/2019 at Unknown time  . enoxaparin (LOVENOX) 80 MG/0.8ML injection Inject 0.8 mLs (80 mg total) into the skin every 12 (twelve) hours. 48 mL 0 07/29/2019 at Unknown time  . lisinopril (ZESTRIL) 20 MG tablet TAKE 1 TABLET BY MOUTH EVERY DAY (Patient taking differently: Take 20 mg by mouth daily. ) 90 tablet 0 Past Week at Unknown time  . meloxicam (MOBIC) 15 MG tablet Take 1 tablet (15 mg total) by mouth daily. 30 tablet 0 07/30/2019 at Unknown time  . metoprolol tartrate (LOPRESSOR) 50 MG tablet Take 1 tablet (50 mg total) by mouth 2 (two) times daily. 60 tablet 0 07/29/2019 at 2115  . ondansetron (ZOFRAN) 4 MG tablet Take 1 tablet (4 mg total) by mouth daily as needed for nausea or vomiting. 30 tablet 0  07/30/2019 at Unknown time  . oxyCODONE (ROXICODONE) 5 MG immediate release tablet Take 1 tablet (5 mg total) by mouth every 4 (four) hours as needed for up to 5 days for severe pain. 20 tablet 0 07/30/2019 at Unknown time    Assessment: 19 YOF with recent PE 07/12/19 originally discharged on Eliquis. Patient returned with worsening PE on 07/20/19 and denied missing doses of Eliquis. Patient originally started on Lovenox with plan to bridge to warfarin. Without hypercoagulable state, plan to treat PE with Lovenox. Hgb low but stable, Plt WNL.  Goal of Therapy:  Monitor platelets by anticoagulation protocol: Yes   Plan:  -Continue Lovenox 1 mg/kg (75 mg) twice daily -Monitor CBC, renal fx and s/s of bleeding   Romilda Garret, PharmD PGY1 Acute Care Pharmacy Resident Phone: [phone] 08/03/2019 9:37 AM  Please check [email or web address] for unit specific pharmacy phone numbers.

## 2019-08-03 NOTE — Plan of Care (Signed)
  Problem: Pain Managment: Goal: General experience of comfort will improve Outcome: Progressing   

## 2019-08-03 NOTE — Progress Notes (Signed)
Notified MD re severe pain to Rt lower back. New order received.

## 2019-08-03 NOTE — Hospital Course (Addendum)
Right flank pain  Pyelonephritis Patient presenting with Right CVA tenderness and hematuria. CT renal stone study reveals no evidence of nephrolithiasis. There is hydronephrosis and perinephric stranding with renal cyst on the right. Urine culture 07/28/19 grew Group B strep. UA negative for Nitrites and Leukocytes, likely because patient was taking Keflex at home. She is admitted for treatment of urosepsis secondary to pyelonephritis. Blood cultures shows no growth to date. Ceftriaxone was started for the duration of 5 days. She was passing clots intermittently in urine. Right flank pain is likely secondary to visceral pain from passage of blood clot through ureters. Repeat CT renal study showed no renal calculus. There was hemorrhage within the right renal collecting system and proximal ureter. Urology consulted and recommended outpatient ureteroscopy to rule out any tumor. Pain was controlled with Dilaudid, Oxycodone and Flomax. Patient is discharged home with PO oxycodone for 5 days and Flomax. She will follow up with Urology outpatient.    History of Pulmonary embolism Patient was in the ED in July 12 2019 for pulmonary embolism. Eliquis was started. Patient was seen again in the ED in 07/20/19.  She was initially started on Lovenox with bridge to warfarin however because there is no definitive objective underlying finding of a hypercoagulable state, will discontinue warfarin and treat her pulmonary embolism with low molecular weight heparin. Patient will follow up with her Hematologist after discharge. Hypercoagulable lab pending for reviewed.    Pleural Effusion CTA performed on admission reveals Left pleural effusion which was present since 07/12/19. Differentials include neoplasm given smoking history. Crackle was noted on at left lower lobe on physical exam. Her oxygen was in the 90s% on room air. Thoracentesis was deferred due to risk of bleeding while on Lovenox and hematuria. She will follow up at  the Internal Medicine clinic with CXR and [place].     History of A-fib Metoprolol for continued.   Emphysema She has history of smoking and just quit recently. She will follow up at our clinic and my require PFTs outpatient

## 2019-08-04 ENCOUNTER — Encounter: Payer: Medicare HMO | Admitting: Internal Medicine

## 2019-08-04 LAB — BASIC METABOLIC PANEL
Anion gap: 8 (ref 5–15)
BUN: 16 mg/dL (ref 8–23)
CO2: 29 mmol/L (ref 22–32)
Calcium: 8.1 mg/dL — ABNORMAL LOW (ref 8.9–10.3)
Chloride: 100 mmol/L (ref 98–111)
Creatinine, Ser: 1.2 mg/dL — ABNORMAL HIGH (ref 0.44–1.00)
GFR calc Af Amer: 53 mL/min — ABNORMAL LOW (ref >60)
GFR calc non Af Amer: 46 mL/min — ABNORMAL LOW (ref >60)
Glucose, Bld: 100 mg/dL — ABNORMAL HIGH (ref 70–99)
Potassium: 3.7 mmol/L (ref 3.5–5.1)
Sodium: 137 mmol/L (ref 135–145)

## 2019-08-04 LAB — CREATININE, SERUM
Creatinine, Ser: 1.25 mg/dL — ABNORMAL HIGH (ref 0.44–1.00)
GFR calc Af Amer: 51 mL/min — ABNORMAL LOW (ref >60)
GFR calc non Af Amer: 44 mL/min — ABNORMAL LOW (ref >60)

## 2019-08-04 LAB — CBC
HCT: 28.5 % — ABNORMAL LOW (ref 36.0–46.0)
Hemoglobin: 8.9 g/dL — ABNORMAL LOW (ref 12.0–15.0)
MCH: 29.5 pg (ref 26.0–34.0)
MCHC: 31.2 g/dL (ref 30.0–36.0)
MCV: 94.4 fL (ref 80.0–100.0)
Platelets: 262 10*3/uL (ref 150–400)
RBC: 3.02 MIL/uL — ABNORMAL LOW (ref 3.87–5.11)
RDW: 14 % (ref 11.5–15.5)
WBC: 12.9 10*3/uL — ABNORMAL HIGH (ref 4.0–10.5)
nRBC: 0 % (ref 0.0–0.2)

## 2019-08-04 LAB — CULTURE, BLOOD (ROUTINE X 2)
Culture: NO GROWTH
Culture: NO GROWTH

## 2019-08-04 NOTE — Progress Notes (Signed)
Urology Inpatient Progress Report  Pyelonephritis [N12] SIRS (systemic inflammatory response syndrome) (HCC) [R65.10]        Intv/Subj: No acute events overnight. Patient is without complaint. She is afebrile with vital signs stable.  Her pain has resolved.  Still having intermittent gross hematuria.  Principal Problem:   Pyelonephritis Active Problems:   Hypertension   Emphysema, unspecified ([place])   Pulmonary embolism (HCC)   Anticoagulant-induced bleeding (HCC)   Pleural effusion on left   Gross hematuria  Current Facility-Administered Medications  Medication Dose Route Frequency Provider Last Rate Last Admin  . acetaminophen (TYLENOL) tablet 650 mg  650 mg Oral Q6H Andrew Au, MD   650 mg at 08/04/19 1031  . cefTRIAXone (ROCEPHIN) 1 g in sodium chloride 0.9 % 100 mL IVPB  1 g Intravenous Q24H Agyei, Obed K, MD 200 mL/hr at 08/04/19 1034 1 g at 08/04/19 1034  . enoxaparin (LOVENOX) injection 75 mg  75 mg Subcutaneous Q12H Thea Gist, RPH   75 mg at 08/04/19 2119  . HYDROmorphone (DILAUDID) injection 0.5 mg  0.5 mg Intravenous Q6H PRN Marton Redwood III, MD      . lisinopril (ZESTRIL) tablet 20 mg  20 mg Oral Daily Agyei, Obed K, MD   20 mg at 08/04/19 1029  . metoprolol tartrate (LOPRESSOR) tablet 12.5 mg  12.5 mg Oral BID Agyei, Obed K, MD   12.5 mg at 08/04/19 1029  . ondansetron (ZOFRAN) injection 4 mg  4 mg Intravenous Q6H PRN Jean Rosenthal, MD   4 mg at 08/03/19 1914  . oxyCODONE (Oxy IR/ROXICODONE) immediate release tablet 5 mg  5 mg Oral Q4H PRN Marton Redwood III, MD   5 mg at 08/04/19 0357  . pantoprazole (PROTONIX) EC tablet 40 mg  40 mg Oral QHS Romilda Garret, RPH   40 mg at 08/03/19 2111  . polyethylene glycol (MIRALAX / GLYCOLAX) packet 17 g  17 g Oral BID Agyei, Obed K, MD   17 g at 08/04/19 1031  . senna (SENOKOT) tablet 8.6 mg  1 tablet Oral BID Andrew Au, MD   8.6 mg at 08/04/19 1029  . tamsulosin (FLOMAX) capsule 0.4 mg  0.4 mg Oral Daily Marton Redwood III, MD   0.4 mg at 08/04/19 1030     Objective: Vital: Vitals:   08/03/19 1629 08/03/19 2051 08/04/19 0441 08/04/19 0938  BP: (!) 153/74 134/66 123/69 135/65  Pulse: 87 77 81 77  Resp: 18 17 17 18   Temp: 100 F (37.8 C) 98.6 F (37 C) 97.7 F (36.5 C) 98.8 F (37.1 C)  TempSrc: Oral Oral Oral Oral  SpO2: 96% 93% 91% 90%  Weight:  75.2 kg    Height:       I/Os: I/O last 3 completed shifts: In: 1894 [P.O.:1380; IV ERDEYCXKG:818] Out: 0   Physical Exam:  General: Patient is in no apparent distress Lungs: Normal respiratory effort, chest expands symmetrically. GI: The abdomen is soft and nontender without mass. Ext: lower extremities symmetric  Lab Results: Recent Labs    08/02/19 0716 08/03/19 0610  WBC 14.8* 16.4*  HGB 10.5* 10.5*  HCT 32.7* 32.8*   Recent Labs    08/02/19 0716 08/02/19 0716 08/03/19 0610 08/04/19 0213 08/04/19 0656  NA 142  --  141  --  137  K 4.2  --  3.6  --  3.7  CL 103  --  102  --  100  CO2 28  --  27  --  29  GLUCOSE 137*  --  142*  --  100*  BUN 10  --  14  --  16  CREATININE 1.10*   < > 1.09* 1.25* 1.20*  CALCIUM 8.8*  --  8.5*  --  8.1*   < > = values in this interval not displayed.   Recent Labs    08/02/19 0402  INR 1.2   No results for input(s): LABURIN in the last 72 hours. Results for orders placed or performed during the hospital encounter of 07/30/19  Urine culture     Status: None   Collection Time: 07/30/19 12:42 PM   Specimen: Urine, Random  Result Value Ref Range Status   Specimen Description URINE, RANDOM  Final   Special Requests NONE  Final   Culture   Final    NO GROWTH Performed at [facility], [address], [place], [place] [address]    Report Status 07/31/2019 FINAL  Final  Blood Culture (routine x 2)     Status: None   Collection Time: 07/30/19  4:06 PM   Specimen: BLOOD RIGHT HAND  Result Value Ref Range Status   Specimen Description BLOOD RIGHT HAND  Final   Special  Requests   Final    BOTTLES DRAWN AEROBIC AND ANAEROBIC Blood Culture results may not be optimal due to an inadequate volume of blood received in culture bottles   Culture   Final    NO GROWTH 5 DAYS Performed at [facility], [place] [address], [place], [place] [address]    Report Status 08/04/2019 FINAL  Final  Blood Culture (routine x 2)     Status: None   Collection Time: 07/30/19  4:06 PM   Specimen: BLOOD LEFT FOREARM  Result Value Ref Range Status   Specimen Description BLOOD LEFT FOREARM  Final   Special Requests   Final    BOTTLES DRAWN AEROBIC AND ANAEROBIC Blood Culture results may not be optimal due to an inadequate volume of blood received in culture bottles   Culture   Final    NO GROWTH 5 DAYS Performed at [facility], [place] [address], [place],  [address]    Report Status 08/04/2019 FINAL  Final  SARS Coronavirus 2 by RT PCR (hospital order, performed in [facility] hospital lab) Nasopharyngeal Nasopharyngeal Swab     Status: None   Collection Time: 07/30/19  5:47 PM   Specimen: Nasopharyngeal Swab  Result Value Ref Range Status   SARS Coronavirus 2 NEGATIVE NEGATIVE Final    Comment: (NOTE) SARS-CoV-2 target nucleic acids are NOT DETECTED.  The SARS-CoV-2 RNA is generally detectable in upper and lower respiratory specimens during the acute phase of infection. The lowest concentration of SARS-CoV-2 viral copies this assay can detect is 250 copies / mL. A negative result does not preclude SARS-CoV-2 infection and should not be used as the sole basis for treatment or other patient management decisions.  A negative result may occur with improper specimen collection / handling, submission of specimen other than nasopharyngeal swab, presence of viral mutation(s) within the areas targeted by this assay, and inadequate number of viral copies (<250 copies / mL). A negative result must be combined with clinical observations, patient history, and  epidemiological information.  Fact Sheet for Patients:   StrictlyIdeas.no  Fact Sheet for Healthcare Providers: BankingDealers.co.za  This test is not yet approved or  cleared by the [place] FDA and has been authorized  for detection and/or diagnosis of SARS-CoV-2 by FDA under an Emergency Use Authorization (EUA).  This EUA will remain in effect (meaning this test can be used) for the duration of the COVID-19 declaration under Section 564(b)(1) of the Act, 21 U.S.C. section 360bbb-3(b)(1), unless the authorization is terminated or revoked sooner.  Performed at [facility], [place] [address] [place],  [address]     Studies/Results: CT RENAL STONE STUDY  Result Date: 08/03/2019 CLINICAL DATA:  Flank pain.  Kidney stone suspected. EXAM: CT ABDOMEN AND PELVIS WITHOUT CONTRAST TECHNIQUE: Multidetector CT imaging of the abdomen and pelvis was performed following the standard protocol without IV contrast. COMPARISON:  07/29/2019 FINDINGS: Lower chest: Right base subsegmental atelectasis. Persistent left base airspace disease. Small left pleural effusion is similar. Normal heart size. Hepatobiliary: Normal liver. Normal gallbladder, without biliary ductal dilatation. Pancreas: Mild pancreatic atrophy, without duct dilatation or acute inflammation. Spleen: Normal in size, without focal abnormality. Adrenals/Urinary Tract: Left greater than right adrenal thickening. Normal noncontrast appearance of the left kidney. Lower pole right renal 5.7 cm fluid density lesion is likely a cyst. Hyperattenuating material, most consistent with hemorrhage, within the right renal collecting system and proximal right ureter. Moderate upper pole right caliectasis. Mild to moderate right hydroureter. Perinephric edema is decreased, mild. The more distal right ureter is relatively normal in caliber. Normal, partially decompressed urinary bladder.  Stomach/Bowel: Normal stomach, without wall thickening. New presacral edema including on 75/5, possibly associated with the rectum. Scattered colonic diverticula, including within this region. Normal terminal ileum. Normal small bowel. Vascular/Lymphatic: Aortic atherosclerosis. No abdominopelvic adenopathy. Reproductive: Hysterectomy.  No adnexal mass. Other: No significant free fluid. Mild pelvic floor laxity. Subcutaneous gas in the anterior abdominal wall is likely iatrogenic. Musculoskeletal: Remote lower anterior right rib fracture. Mild osteopenia. Lumbosacral spondylosis. Mild L1 and T8 compression deformities are similar. IMPRESSION: 1. Similar appearance of hemorrhage within the right renal collecting system and proximal ureter. Similar right-sided hydroureteronephrosis with decrease in perinephric edema. Although this hemorrhage could be spontaneous, an underlying cause such as urothelial carcinoma is a concern. Consider urology consultation. 2. No urinary tract calculi identified. 3. New presacral edema which may be associated with the rectosigmoid colon. Given diverticula in this region, diverticulitis is suspected. Correlate with symptomatology. 4. Similar small left pleural effusion with adjacent collapse/consolidative change which could represent atelectasis or pneumonia. 5.  Aortic Atherosclerosis (ICD10-I70.0). Electronically Signed   By: Abigail Miyamoto M.D.   On: 08/03/2019 11:01    Assessment: Gross hematuria Right hydronephrosis secondary to upper tract gross hematuria with clots  Plan: I discussed the role of cystoscopy with right diagnostic ureteroscopy with possible biopsy, possible lithotripsy, possible ureteral stent placement.  This would be diagnostic to rule out upper tract urothelial cell carcinoma.  Her pain has resolved.  Request input on ability to hold anticoagulation versus anticoagulation management perioperatively.  I will obtain a urine cytology.  Plan to schedule this on  an outpatient basis.  I will have my schedulers call her.  Recommend sending her home with Flomax 0.4 mg daily and oral opioid analgesia as needed.   Link Snuffer, MD Urology 08/04/2019, 11:16 AM

## 2019-08-04 NOTE — Progress Notes (Addendum)
Subjective:   Hospital day: 5  Overnight event: Patient complained of severe Right flank pain yesterday afternoon. Patient had received 0.5 mg Dilaudid 2 hour prior. 15 mg Toradol was given and pain did improve.     Patient examined at bedside. States she feels much better this morning. States her pressure feels improved as well. Pain medication last required at 9PM last night. She is moving her bowels well with Miralax. Patient agreeable to urology follow up.  Objective:  Vital signs in last 24 hours: Vitals:   08/03/19 1629 08/03/19 2051 08/04/19 0441 08/04/19 0938  BP: (!) 153/74 134/66 123/69 135/65  Pulse: 87 77 81 77  Resp: 18 17 17 18   Temp: 100 F (37.8 C) 98.6 F (37 C) 97.7 F (36.5 C) 98.8 F (37.1 C)  TempSrc: Oral Oral Oral Oral  SpO2: 96% 93% 91% 90%  Weight:  75.2 kg    Height:        Physical Exam  Physical Exam Constitutional:      General: She is not in acute distress.    Appearance: Normal appearance. She is not toxic-appearing.  HENT:     Head: Normocephalic.  Eyes:     General: No scleral icterus.    Conjunctiva/sclera: Conjunctivae normal.  Cardiovascular:     Rate and Rhythm: Normal rate and regular rhythm.     Heart sounds: Normal heart sounds.  Pulmonary:     Effort: Pulmonary effort is normal. No respiratory distress.     Comments: Crackles noted left lower lung field Abdominal:     General: Bowel sounds are normal.     Palpations: Abdomen is soft.     Tenderness: There is no abdominal tenderness.  Musculoskeletal:        General: Normal range of motion.     Right lower leg: No edema.     Left lower leg: No edema.  Skin:    General: Skin is warm.     Coloration: Skin is not jaundiced.  Neurological:     Mental Status: She is alert.  Psychiatric:        Mood and Affect: Mood normal.     Assessment/Plan: Kimberly Walters is a 69 yo female with history of PE on Lovenox, A-fib, HTN, who presents to the ER for Right low back pain,  associated with hematuria, nausea and vomiting. Currently treating for Pyelonephritis and intractable pain.  Principal Problem:   Pyelonephritis Active Problems:   Hypertension   Emphysema, unspecified ([place])   Pulmonary embolism (HCC)   Anticoagulant-induced bleeding (HCC)   Pleural effusion on left   Gross hematuria   Right flank pain (pyelonephritis with component of visceral pain from passage of blood clot through ureters) CT abdomen performed showed ongoing hemorrhage within the right renal collecting system and proximal ureter, similar right-sided hydroureteronephrosis, stranding has improved.  No evidence of urinary tract calculi was identified. Urology consulted. They recommend supportive care and schedule an outpatient diagnostic ureteroscopy with possible biopsy, possible lithotripsy, possible ureteral stent placement to rule out upper tract urothelial cell carcinoma.    - Continue Cetriaxone 1g (day 6) with plan to transition to Keflex or cefdinir at discharge. - Continue Flomax 0.4 mg daily. Patient will continue this medication after discharge. - Pain regimen: Tylenol 650 mg q6hrs, Oxycodone 5 mg q4h PRN, Dilaudid 0.5 mg q6hr for breakthrough. Intermittent doses of Toradol to be used judiciously.  - Zofran PRN for nausea.  - Continue bowel regimen Miralax and Senna  History of Pulmonary embolism - Continue Lovenox at 1 mg/kg - Daily CBC - Hgb stable     Anemia Hematuria - Hemoglobin stable - Follow-up daily CBC     Acute kidney injury-resolved - Follow up BMP in AM     Pleural Effusion Left pleural effusion was present since 07/12/19. Differentials include neoplasm given smoking history.  SPO2 unremarkable   - Monitor at the moment - Will follow up at the Internal Medicine clinic with CXR and [place].     Hypertension  - Continue lisinopril      A-fib - Continue Metoprolol - Continue Lovenox     Emphysema disease - Patient has history of smoking. Quit  recently - Will require outpatient PFTs     Diet: regular IVF: N/A CODE: Full DVT: Lovenox   Prior to Admission Living Arrangement: home Anticipated Discharge Location: Home Barriers to Discharge: requiring IV pain medications Dispo: Anticipated discharge in approximately 1-2 day(s).    Gaylan Gerold, DO 08/04/2019, 10:59 AM Pager: [phone] After 5pm on weekdays and 1pm on weekends: On Call pager [phone]

## 2019-08-05 ENCOUNTER — Telehealth: Payer: Self-pay | Admitting: Internal Medicine

## 2019-08-05 LAB — CBC
HCT: 30.2 % — ABNORMAL LOW (ref 36.0–46.0)
Hemoglobin: 9.3 g/dL — ABNORMAL LOW (ref 12.0–15.0)
MCH: 29.1 pg (ref 26.0–34.0)
MCHC: 30.8 g/dL (ref 30.0–36.0)
MCV: 94.4 fL (ref 80.0–100.0)
Platelets: 288 10*3/uL (ref 150–400)
RBC: 3.2 MIL/uL — ABNORMAL LOW (ref 3.87–5.11)
RDW: 13.8 % (ref 11.5–15.5)
WBC: 7.5 10*3/uL (ref 4.0–10.5)
nRBC: 0 % (ref 0.0–0.2)

## 2019-08-05 LAB — BASIC METABOLIC PANEL
Anion gap: 9 (ref 5–15)
BUN: 13 mg/dL (ref 8–23)
CO2: 29 mmol/L (ref 22–32)
Calcium: 8.4 mg/dL — ABNORMAL LOW (ref 8.9–10.3)
Chloride: 103 mmol/L (ref 98–111)
Creatinine, Ser: 1.13 mg/dL — ABNORMAL HIGH (ref 0.44–1.00)
GFR calc Af Amer: 57 mL/min — ABNORMAL LOW (ref >60)
GFR calc non Af Amer: 50 mL/min — ABNORMAL LOW (ref >60)
Glucose, Bld: 161 mg/dL — ABNORMAL HIGH (ref 70–99)
Potassium: 3.5 mmol/L (ref 3.5–5.1)
Sodium: 141 mmol/L (ref 135–145)

## 2019-08-05 LAB — CYTOLOGY - NON PAP

## 2019-08-05 MED ORDER — OXYCODONE HCL 5 MG PO TABS: 5.0000 mg | ORAL_TABLET | ORAL | 0 refills | Status: AC | PRN

## 2019-08-05 MED ORDER — TAMSULOSIN HCL 0.4 MG PO CAPS: 0.4000 mg | ORAL_CAPSULE | Freq: Every day | ORAL | 0 refills | Status: DC

## 2019-08-05 NOTE — Plan of Care (Signed)
Patient discharged to home at 1215 via private vehicle, accompanied by her daughter. All discharge instructions and medications reviewed with patient and daughter. Stable at the time of discharge.   Problem: Pain Managment: Goal: General experience of comfort will improve Outcome: Completed/Met

## 2019-08-05 NOTE — Consult Note (Signed)
   [facility] CM Inpatient Consult   08/05/2019  Kimberly Walters [birth year] [id number]   [facility] [ACO] Patient: [facility]   Patient screened for hospitalizations to check if potential [facility] service needs.   Primary Care Provider has [facility] for provider and is listed to provide the transition of care [TOC] for post hospital follow up. Patient already transitioned home.  For questions contact:   Natividad Brood, RN BSN [facility] Liaison  [phone] business mobile phone Toll free office [phone]  Fax number: [phone] [email or web address]

## 2019-08-05 NOTE — Progress Notes (Signed)
Subjective:   Hospital day: 6  Overnight event: No  Patient examined at bedside. States that she had a pretty good night after starting Flomax. Reports having some pain around 9pm, pain resolved with prns. Notes hematuria before and after the event, but not during. Had a BM today. Tolerated breakfast well. She is agreeable to discharge today with close follow up.  Objective:  Vital signs in last 24 hours: Vitals:   08/04/19 1702 08/04/19 2106 08/05/19 0523 08/05/19 0902  BP: 126/71 (!) 153/72 (!) 143/74 123/69  Pulse: 82 75 91 79  Resp: 18 18 20 18   Temp: 98.4 F (36.9 C) 98.8 F (37.1 C) 98.8 F (37.1 C) 98.6 F (37 C)  TempSrc: Oral Oral Oral Oral  SpO2: 92% 92% 93% 91%  Weight:  75.2 kg    Height:        Physical Exam Physical Exam Constitutional:      General: She is not in acute distress.    Appearance: Normal appearance.  HENT:     Head: Normocephalic.  Eyes:     General: No scleral icterus.    Conjunctiva/sclera: Conjunctivae normal.  Cardiovascular:     Rate and Rhythm: Normal rate and regular rhythm.     Heart sounds: Normal heart sounds.  Pulmonary:     Effort: Pulmonary effort is normal. No respiratory distress.  Abdominal:     General: Bowel sounds are normal.     Palpations: Abdomen is soft.     Tenderness: There is no abdominal tenderness.  Musculoskeletal:        General: Normal range of motion.     Cervical back: Normal range of motion.     Right lower leg: No edema.     Left lower leg: No edema.  Skin:    General: Skin is warm.     Coloration: Skin is not jaundiced.  Neurological:     Mental Status: She is alert.  Psychiatric:        Mood and Affect: Mood normal.    Assessment/Plan: Kimberly Walters a 69 yo female with history of PE on Lovenox, A-fib, HTN, who presents to the ER for Right low back pain, associated with hematuria, nausea and vomiting.Currently treating for Pyelonephritisand intractable pain.  Principal Problem:    Pyelonephritis Active Problems:   Hypertension   Emphysema, unspecified ([place])   Pulmonary embolism (HCC)   Anticoagulant-induced bleeding (HCC)   Pleural effusion on left   Gross hematuria   Right flank pain (pyelonephritiswith component of visceral pain from passage of blood clot through ureters) CT abdomen performed showedongoinghemorrhage within the right renal collecting system and proximal ureter, similar right-sided hydroureteronephrosis, stranding has improved. No evidence of urinary tract calculi was identified. Urology consulted. They recommend supportive care and schedule an outpatient diagnostic ureteroscopy with possible biopsy, possible lithotripsy, possible ureteral stent placement to rule out upper tract urothelial cell carcinoma.  -Finish7 day course Cetriaxone 1g - Continue Flomax 0.4 mg daily after discharge. -Oxycodone 5 mg Q4h PRN for 5 days after discharge.  - Follow up with Dr. Gloriann Loan, urologist, for outpatient ureteroscopy.    History of Pulmonary embolism -Continue Lovenox at 80 mg daily at home - Follow up with hematologist next week. Hypercoagulable labs ordered.   Anemia Hematuria -Hemoglobin stable   Pleural Effusion Left pleural effusion was present since 07/12/19. Differentials include neoplasm given smoking history.SPO2 unremarkable.  - Follow up at the Internal Medicine clinic with CXR and [place].   Hypertension  -Continuelisinopril  A-fib - Continue Metoprolol -Continue Lovenox   Emphysema disease - Patient has history of smoking. Quit recently -Will require outpatient PFTs. Follow up at the clinic   Diet: regular IVF:N/A CODE: Full DVT: Lovenox  Prior to Admission Living Arrangement:home Anticipated Discharge Location:Home Barriers to Discharge:none Dispo: Anticipated discharge in approximately0day(s).  Gaylan Gerold, DO 08/05/2019, 10:25 AM Pager: [phone] After 5pm on weekdays and 1pm on  weekends: On Call pager [phone]

## 2019-08-05 NOTE — Telephone Encounter (Signed)
TOC HFU PER DR AGYEI WITH DR BLOOMFIELD 08/09/2019 @ 10:15AM/

## 2019-08-06 NOTE — Telephone Encounter (Signed)
Transition Care Management Follow-up Telephone Call  Date of discharge and from where: 08/05/19 for [facility]  How have you been since you were released from the hospital? "Good, I've taken no pain medicine".  Any questions or concerns? No   Items Reviewed:  Did the pt receive and understand the discharge instructions provided? Yes   Medications obtained and verified? Yes   Any new allergies since your discharge? No   Dietary orders reviewed? Yes, Low Sodium  Do you have support at home? Yes , "I have lots of family helping"  Functional Questionnaire: (I = Independent and D = Dependent) ADLs: I  Bathing/Dressing- I  Meal Prep- D  Eating- I  Maintaining continence- I  Transferring/Ambulation- I  Managing Meds- I  Follow up appointments reviewed:   PCP Hospital f/u appt confirmed? Yes  Scheduled to see Dr. Koleen Distance on 08/09/19 @ 1015.  [facility] f/u appt confirmed? Yes  Scheduled to see Hematology on 08/10/19 @ 1100.  Are transportation arrangements needed? No   If their condition worsens, is the pt aware to call PCP or go to the Emergency Dept.? Yes  Was the patient provided with contact information for the PCP's office or ED? Yes  Was to pt encouraged to call back with questions or concerns? Yes

## 2019-08-06 NOTE — Discharge Summary (Addendum)
Name: Kimberly Walters MRN: [id number] DOB: Dec 11, 1950 69 y.o. PCP: Delice Bison, DO  Date of Admission: 07/30/2019 10:43 AM Date of Discharge: 08/05/2019 Attending Physician: Lenice Pressman, MD, PhD   Discharge Diagnosis: 1. Pyelonephritis 2. Right flank pain 3. Gross hematuria  3. History of Pulmonary embolism 4. Pleural effusion on the left 5. Emphysema  Discharge Medications: Allergies as of 08/05/2019   No Known Allergies      Medication List     STOP taking these medications    acetaminophen 500 MG tablet Commonly known as: TYLENOL   cephALEXin 500 MG capsule Commonly known as: KEFLEX   meloxicam 15 MG tablet Commonly known as: MOBIC       TAKE these medications    enoxaparin 80 MG/0.8ML injection Commonly known as: LOVENOX Inject 0.8 mLs (80 mg total) into the skin every 12 (twelve) hours.   lisinopril 20 MG tablet Commonly known as: ZESTRIL TAKE 1 TABLET BY MOUTH EVERY DAY   metoprolol tartrate 50 MG tablet Commonly known as: LOPRESSOR Take 1 tablet (50 mg total) by mouth 2 (two) times daily.   ondansetron 4 MG tablet Commonly known as: Zofran Take 1 tablet (4 mg total) by mouth daily as needed for nausea or vomiting.   oxyCODONE 5 MG immediate release tablet Commonly known as: Roxicodone Take 1 tablet (5 mg total) by mouth every 4 (four) hours as needed for up to 5 days for severe pain.   tamsulosin 0.4 MG Caps capsule Commonly known as: FLOMAX Take 1 capsule (0.4 mg total) by mouth daily.        Disposition and follow-up:   Kimberly Walters was discharged from [facility] in Stable condition.  At the hospital follow up visit please address:  1.   Pyelonephritis Right flank pain Pain likely due to renal colic with passage of blood clots through the ureter, improved with tamsulosin - Follow up with Dr. Gloriann Loan for outpatient cystoscopy and ureteroscopy - Continue tamsulosin. Oxycodone for pain PRN  History of  PE - Follow up with Hematology for evaluation of possible hypercoagulability and warfarin bridging from Lovenox - Determine safety of holding anticoagulation for urologic procedure - Review hypercoagulable labs  Pleural effusion - Evaluate with CXR or [place]. Patient has history of 1/2 pack a day. Just quit recently  Emphysema  - Follow up with PFT  2.  Labs / imaging needed at time of follow-up: Imaging of pleural effusion  3.  Pending labs/ test needing follow-up: Hypercoagulable labs   Follow-up Appointments:    Hospital Course by problem list: 1. Right flank pain  Pyelonephritis Patient presenting with Right CVA tenderness and hematuria. CT renal stone study reveals no evidence of nephrolithiasis. There is hydronephrosis and perinephric stranding with renal cyst on the right. Urine culture from 07/28/19 grew Group B strep. UA on admission negative for Nitrites and Leukocytes, likely because patient was taking Keflex at home. She is admitted for treatment of urosepsis secondary to pyelonephritis. Blood cultures show no growth to date. Ceftriaxone was started for the duration of 5 days. She was passing clots intermittently in urine. Right flank pain is likely secondary to visceral pain from passage of blood clot through ureters. Repeat CT renal study showed no renal calculus but ongoing hemorrhage into the right renal collecting system and hydronephrosis, decreased stranding.  Urology consulted and recommended outpatient cystoscopy and ureteroscopy to rule out any tumor. Pain was controlled with Dilaudid, Oxycodone and Flomax. Patient is discharged home  with PO oxycodone for 5 days and Flomax. She will follow up with Urology outpatient.    History of Pulmonary embolism Patient was in the ED in July 12 2019 for pulmonary embolism. Eliquis was started, but she had progression of her PE and was switched to Lovenox.  PCP had planned to refer to hematology for further evaluation before initiating  warfarin. Patient will follow up with Hematology 7/20. Hypercoagulable lab ordered and pending.    Pleural Effusion CTA performed on admission reveals Left pleural effusion which was present since 07/12/19. Differentials include neoplasm given smoking history. She had worn O2 since PE diagnosis, but was weaned to room air during hospitalization. Thoracentesis was deferred due to risk of bleeding while on Lovenox and hematuria. She will follow up at the Internal Medicine clinic with CXR and [place].   History of A-fib Metoprolol for continued.   Emphysema She has history of smoking and just quit recently. She will follow up at our clinic and may require PFTs outpatient   Discharge Vitals:   BP 123/69 (BP Location: Right Arm)   Pulse 79   Temp 98.6 F (37 C) (Oral)   Resp 18   Ht 5\' 4"  (1.626 m)   Wt 75.2 kg   SpO2 91%   BMI 28.46 kg/m   Pertinent Labs, Studies, and Procedures:  CT RENAL STONE STUDY  Result Date: 08/03/2019 CLINICAL DATA:  Flank pain.  Kidney stone suspected. EXAM: CT ABDOMEN AND PELVIS WITHOUT CONTRAST TECHNIQUE: Multidetector CT imaging of the abdomen and pelvis was performed following the standard protocol without IV contrast. COMPARISON:  07/29/2019 FINDINGS: Lower chest: Right base subsegmental atelectasis. Persistent left base airspace disease. Small left pleural effusion is similar. Normal heart size. Hepatobiliary: Normal liver. Normal gallbladder, without biliary ductal dilatation. Pancreas: Mild pancreatic atrophy, without duct dilatation or acute inflammation. Spleen: Normal in size, without focal abnormality. Adrenals/Urinary Tract: Left greater than right adrenal thickening. Normal noncontrast appearance of the left kidney. Lower pole right renal 5.7 cm fluid density lesion is likely a cyst. Hyperattenuating material, most consistent with hemorrhage, within the right renal collecting system and proximal right ureter. Moderate upper pole right caliectasis. Mild to  moderate right hydroureter. Perinephric edema is decreased, mild. The more distal right ureter is relatively normal in caliber. Normal, partially decompressed urinary bladder. Stomach/Bowel: Normal stomach, without wall thickening. New presacral edema including on 75/5, possibly associated with the rectum. Scattered colonic diverticula, including within this region. Normal terminal ileum. Normal small bowel. Vascular/Lymphatic: Aortic atherosclerosis. No abdominopelvic adenopathy. Reproductive: Hysterectomy.  No adnexal mass. Other: No significant free fluid. Mild pelvic floor laxity. Subcutaneous gas in the anterior abdominal wall is likely iatrogenic. Musculoskeletal: Remote lower anterior right rib fracture. Mild osteopenia. Lumbosacral spondylosis. Mild L1 and T8 compression deformities are similar. IMPRESSION: 1. Similar appearance of hemorrhage within the right renal collecting system and proximal ureter. Similar right-sided hydroureteronephrosis with decrease in perinephric edema. Although this hemorrhage could be spontaneous, an underlying cause such as urothelial carcinoma is a concern. Consider urology consultation. 2. No urinary tract calculi identified. 3. New presacral edema which may be associated with the rectosigmoid colon. Given diverticula in this region, diverticulitis is suspected. Correlate with symptomatology. 4. Similar small left pleural effusion with adjacent collapse/consolidative change which could represent atelectasis or pneumonia. 5.  Aortic Atherosclerosis (ICD10-I70.0). Electronically Signed   By: Abigail Miyamoto M.D.   On: 08/03/2019 11:01    CBC Latest Ref Rng & Units 08/05/2019 08/04/2019 08/03/2019  WBC 4.0 - 10.5  K/uL 7.5 12.9(H) 16.4(H)  Hemoglobin 12.0 - 15.0 g/dL 9.3(L) 8.9(L) 10.5(L)  Hematocrit 36 - 46 % 30.2(L) 28.5(L) 32.8(L)  Platelets 150 - 400 K/uL 288 262 340   BMP Latest Ref Rng & Units 08/05/2019 08/04/2019 08/04/2019  Glucose 70 - 99 mg/dL 161(H) 100(H) -  BUN 8 -  23 mg/dL 13 16 -  Creatinine 0.44 - 1.00 mg/dL 1.13(H) 1.20(H) 1.25(H)  BUN/Creat Ratio 12 - 28 - - -  Sodium 135 - 145 mmol/L 141 137 -  Potassium 3.5 - 5.1 mmol/L 3.5 3.7 -  Chloride 98 - 111 mmol/L 103 100 -  CO2 22 - 32 mmol/L 29 29 -  Calcium 8.9 - 10.3 mg/dL 8.4(L) 8.1(L) -   Urinalysis    Component Value Date/Time   COLORURINE RED (A) 08/03/2019 1644   APPEARANCEUR TURBID (A) 08/03/2019 1644   LABSPEC  08/03/2019 1644    TEST NOT REPORTED DUE TO COLOR INTERFERENCE OF URINE PIGMENT   PHURINE  08/03/2019 1644    TEST NOT REPORTED DUE TO COLOR INTERFERENCE OF URINE PIGMENT   GLUCOSEU (A) 08/03/2019 1644    TEST NOT REPORTED DUE TO COLOR INTERFERENCE OF URINE PIGMENT   HGBUR (A) 08/03/2019 1644    TEST NOT REPORTED DUE TO COLOR INTERFERENCE OF URINE PIGMENT   BILIRUBINUR (A) 08/03/2019 1644    TEST NOT REPORTED DUE TO COLOR INTERFERENCE OF URINE PIGMENT   KETONESUR (A) 08/03/2019 1644    TEST NOT REPORTED DUE TO COLOR INTERFERENCE OF URINE PIGMENT   PROTEINUR (A) 08/03/2019 1644    TEST NOT REPORTED DUE TO COLOR INTERFERENCE OF URINE PIGMENT   NITRITE (A) 08/03/2019 1644    TEST NOT REPORTED DUE TO COLOR INTERFERENCE OF URINE PIGMENT   LEUKOCYTESUR (A) 08/03/2019 1644    TEST NOT REPORTED DUE TO COLOR INTERFERENCE OF URINE PIGMENT    Discharge Instructions: Discharge Instructions     Diet - low sodium heart healthy   Complete by: As directed    Discharge instructions   Complete by: As directed    Kimberly Walters,  It was a pleasure taking care of you in the hospital. You were admitted for  pyelonephritis and received antibiotics. Due to the blood clot, we continued your anticoagulation. Here are my recommendations:  1)Please follow up with the Hematologist.  2)You will receive a call from the Urologist to set up an appointment 3)Please follow up with our clinic [facility]).  Take care!   Increase activity slowly   Complete by: As directed        Signed: Gaylan Gerold,  DO 08/06/2019, 8:06 AM   Pager: [phone]

## 2019-08-09 ENCOUNTER — Encounter: Payer: Self-pay | Admitting: Internal Medicine

## 2019-08-09 ENCOUNTER — Ambulatory Visit (INDEPENDENT_AMBULATORY_CARE_PROVIDER_SITE_OTHER): Payer: Medicare HMO | Admitting: Internal Medicine

## 2019-08-09 VITALS — BP 126/55 | HR 68 | Temp 98.4°F | Wt 159.0 lb

## 2019-08-09 DIAGNOSIS — I2699 Other pulmonary embolism without acute cor pulmonale: Secondary | ICD-10-CM

## 2019-08-09 DIAGNOSIS — R310 Gross hematuria: Secondary | ICD-10-CM

## 2019-08-09 DIAGNOSIS — J90 Pleural effusion, not elsewhere classified: Secondary | ICD-10-CM

## 2019-08-09 DIAGNOSIS — I2694 Multiple subsegmental pulmonary emboli without acute cor pulmonale: Secondary | ICD-10-CM

## 2019-08-09 NOTE — Patient Instructions (Signed)
Kimberly Walters, It was nice seeing you! So glad to hear you're slowly starting to feel better.   We discussed your upcoming hematology appointment. I will follow-up in your chart regarding the plan. Hopefully we will be able to coordinate an early appointment with urology as well once we know what the plan is with your blood thinner.   Regarding the fluid on your lung, I suspect it is most likely related to inflammation when you had your pulmonary infarct. I'd like you to get a chest x-ray in about 2 weeks.   I'll see you back for a follow-up visit in 4 weeks to see how things are going.

## 2019-08-10 ENCOUNTER — Encounter: Payer: Self-pay | Admitting: Internal Medicine

## 2019-08-10 ENCOUNTER — Inpatient Hospital Stay: Payer: Medicare HMO | Attending: Oncology | Admitting: Oncology

## 2019-08-10 ENCOUNTER — Other Ambulatory Visit: Payer: Self-pay

## 2019-08-10 VITALS — BP 123/50 | HR 66 | Temp 97.7°F | Resp 18 | Ht 64.0 in | Wt 160.1 lb

## 2019-08-10 DIAGNOSIS — Z87891 Personal history of nicotine dependence: Secondary | ICD-10-CM

## 2019-08-10 DIAGNOSIS — R319 Hematuria, unspecified: Secondary | ICD-10-CM

## 2019-08-10 DIAGNOSIS — J90 Pleural effusion, not elsewhere classified: Secondary | ICD-10-CM | POA: Diagnosis not present

## 2019-08-10 DIAGNOSIS — I2694 Multiple subsegmental pulmonary emboli without acute cor pulmonale: Secondary | ICD-10-CM

## 2019-08-10 DIAGNOSIS — Z86711 Personal history of pulmonary embolism: Secondary | ICD-10-CM | POA: Diagnosis not present

## 2019-08-10 DIAGNOSIS — I2699 Other pulmonary embolism without acute cor pulmonale: Secondary | ICD-10-CM

## 2019-08-10 NOTE — Assessment & Plan Note (Signed)
Patient recently hospitalized for acute nephrolithiasis and re-admitted for worsening pain despite passage of kidney stone. Repeat CT showed no evidence of stone, but persistent hemorrhage within the right renal collecting system where stone previously passed. She continues to have gross hematuria which may be spontaneous from anticoagulation, but agree with plan for further urologic work-up with cystoscopy to rule out malignancy. She has an appointment with hematology today to advise on long-term anticoagulation plan and can advise on when would be appropriate to hold anticoagulation for urologic procedure.

## 2019-08-10 NOTE — Progress Notes (Signed)
Acute Office Visit  Subjective:    Patient ID: Kimberly Walters, female    DOB: 05/13/1950, 69 y.o.   MRN: [id number]  Chief Complaint  Patient presents with  . Hospitalization Follow-up    HPI Patient is in today for hospital follow-up after recent admission for pyelonephritis. Please see problem based charting for further details.   Past Medical History:  Diagnosis Date  . Hypertension     Past Surgical History:  Procedure Laterality Date  . HYSTERECTOMY ABDOMINAL WITH SALPINGO-OOPHORECTOMY  1975    Family History  Problem Relation Age of Onset  . Diabetes Mother   . Diabetes Father   . CAD Father   . Hypothyroidism Sister   . Melanoma Sister   . Ovarian cancer Other   . Uterine cancer Other   . Breast cancer Other   . Diabetes Son   . Uterine cancer Niece   . Pancreatic cancer Nephew     Social History   Socioeconomic History  . Marital status: Single    Spouse name: Not on file  . Number of children: Not on file  . Years of education: Not on file  . Highest education level: Not on file  Occupational History  . Not on file  Tobacco Use  . Smoking status: Former Smoker    Packs/day: 0.50    Types: Cigarettes    Quit date: 07/01/2019    Years since quitting: 0.1  . Smokeless tobacco: Never Used  . Tobacco comment: cutting back   Substance and Sexual Activity  . Alcohol use: No  . Drug use: No  . Sexual activity: Not on file  Other Topics Concern  . Not on file  Social History Narrative  . Not on file   Social Determinants of Health   Financial Resource Strain:   . Difficulty of Paying Living Expenses:   Food Insecurity:   . Worried About Charity fundraiser in the Last Year:   . Arboriculturist in the Last Year:   Transportation Needs:   . Film/video editor (Medical):   [name] Lack of Transportation (Non-Medical):   Physical Activity:   . Days of Exercise per Week:   . Minutes of Exercise per Session:   Stress:   . Feeling of Stress :    Social Connections:   . Frequency of Communication with Friends and Family:   . Frequency of Social Gatherings with Friends and Family:   . Attends Religious Services:   . Active Member of Clubs or Organizations:   . Attends Archivist Meetings:   [name] Marital Status:   Intimate Partner Violence:   . Fear of Current or Ex-Partner:   . Emotionally Abused:   [name] Physically Abused:   . Sexually Abused:     Outpatient Medications Prior to Visit  Medication Sig Dispense Refill  . enoxaparin (LOVENOX) 80 MG/0.8ML injection Inject 0.8 mLs (80 mg total) into the skin every 12 (twelve) hours. 48 mL 0  . lisinopril (ZESTRIL) 20 MG tablet TAKE 1 TABLET BY MOUTH EVERY DAY (Patient taking differently: Take 20 mg by mouth daily. ) 90 tablet 0  . metoprolol tartrate (LOPRESSOR) 50 MG tablet Take 1 tablet (50 mg total) by mouth 2 (two) times daily. 60 tablet 0  . ondansetron (ZOFRAN) 4 MG tablet Take 1 tablet (4 mg total) by mouth daily as needed for nausea or vomiting. 30 tablet 0  . oxyCODONE (ROXICODONE) 5 MG immediate release tablet Take  1 tablet (5 mg total) by mouth every 4 (four) hours as needed for up to 5 days for severe pain. 20 tablet 0  . tamsulosin (FLOMAX) 0.4 MG CAPS capsule Take 1 capsule (0.4 mg total) by mouth daily. 30 capsule 0   No facility-administered medications prior to visit.    No Known Allergies  Review of Systems  Constitutional: Negative for chills and fever.  Respiratory: Negative for shortness of breath.   Cardiovascular: Negative for palpitations.  Gastrointestinal: Negative for blood in stool and nausea.  Genitourinary: Positive for hematuria. Negative for dysuria and flank pain.  Neurological: Negative for syncope, light-headedness and headaches.       Objective:    Physical Exam Constitutional:      General: She is not in acute distress.    Appearance: Normal appearance.  Cardiovascular:     Rate and Rhythm: Normal rate and regular rhythm.   Pulmonary:     Effort: Pulmonary effort is normal.     Breath sounds: Normal breath sounds.  Abdominal:     Palpations: Abdomen is soft.     Tenderness: There is no abdominal tenderness. There is no right CVA tenderness, left CVA tenderness or guarding.  Musculoskeletal:     Right lower leg: No edema.     Left lower leg: Edema present.  Neurological:     General: No focal deficit present.     Mental Status: She is alert and oriented to person, place, and time.  Psychiatric:        Mood and Affect: Mood normal.        Behavior: Behavior normal.     BP (!) 126/55 (BP Location: Left Arm, Patient Position: Sitting, Cuff Size: Normal)   Pulse 68   Temp 98.4 F (36.9 C) (Oral)   Wt 159 lb (72.1 kg)   SpO2 94%   BMI 27.29 kg/m  Wt Readings from Last 3 Encounters:  08/09/19 159 lb (72.1 kg)  08/04/19 165 lb 12.7 oz (75.2 kg)  07/29/19 162 lb 3.2 oz (73.6 kg)    Health Maintenance Due  Topic Date Due  . Hepatitis C Screening  Never done  . COVID-19 Vaccine (1) Never done  . TETANUS/TDAP  Never done  . COLON CANCER SCREENING ANNUAL FOBT  Never done    There are no preventive care reminders to display for this patient.   Lab Results  Component Value Date   TSH 0.547 07/14/2019   Lab Results  Component Value Date   WBC 7.5 08/05/2019   HGB 9.3 (L) 08/05/2019   HCT 30.2 (L) 08/05/2019   MCV 94.4 08/05/2019   PLT 288 08/05/2019   Lab Results  Component Value Date   NA 141 08/05/2019   K 3.5 08/05/2019   CO2 29 08/05/2019   GLUCOSE 161 (H) 08/05/2019   BUN 13 08/05/2019   CREATININE 1.13 (H) 08/05/2019   BILITOT 0.3 07/31/2019   ALKPHOS 59 07/31/2019   AST 16 07/31/2019   ALT 29 07/31/2019   PROT 5.1 (L) 07/31/2019   ALBUMIN 2.1 (L) 07/31/2019   CALCIUM 8.4 (L) 08/05/2019   ANIONGAP 9 08/05/2019   Lab Results  Component Value Date   CHOL 168 11/16/2018   Lab Results  Component Value Date   HDL 58 11/16/2018   Lab Results  Component Value Date    LDLCALC 85 11/16/2018   Lab Results  Component Value Date   TRIG 146 11/16/2018   Lab Results  Component Value Date  CHOLHDL 2.9 11/16/2018   Lab Results  Component Value Date   HGBA1C 6.2 (H) 11/16/2018       Assessment & Plan:   Problem List Items Addressed This Visit      Cardiovascular and Mediastinum   Pulmonary embolism ([place])    Failed initial treatment with Eliquis given worsening of PE despite full compliance with DOAC.  She remains on lovenox until hematology appointment scheduled for 08/10/19 to determine if hypercoagulable work-up is indicated prior to bridging to warfarin.  Will need to ensure she is UTD on all age appropriate screenings.         Respiratory   Pleural effusion on left - Primary    Initially seen on CT scan from 6/22 when she was first diagnosed with PE and pulmonary infarct. Suspect residual effusion is from inflammation related to this and should resolve with time. Will obtain CXR in 2 weeks to monitor. Her hypoxia has resolved, and she is doing well off of supplemental oxygen.      Relevant Orders   DG Chest 2 View     Genitourinary   Gross hematuria    Patient recently hospitalized for acute nephrolithiasis and re-admitted for worsening pain despite passage of kidney stone. Repeat CT showed no evidence of stone, but persistent hemorrhage within the right renal collecting system where stone previously passed. She continues to have gross hematuria which may be spontaneous from anticoagulation, but agree with plan for further urologic work-up with cystoscopy to rule out malignancy. She has an appointment with hematology today to advise on long-term anticoagulation plan and can advise on when would be appropriate to hold anticoagulation for urologic procedure.           No orders of the defined types were placed in this encounter.    Delice Bison, DO

## 2019-08-10 NOTE — Progress Notes (Signed)
Reason for the request:    Pulmonary embolism  HPI: I was asked by Dr. Heber Mansfield to evaluate Kimberly Walters for the evaluation of a pulmonary embolism.  She is a 69 year old woman hospitalized in June 2021 after presenting with acute respiratory failure and found to have bilateral unprovoked pulmonary emboli at that time.  CT scan obtained on 07/13/2019 showed extensive bilateral arterial filling defects in the bilateral lower lobar pulmonary arteries with some increase in right heart strain.  She was treated with Eliquis and discharged on 07/16/2019.  She presented on 07/20/2019 with worsening symptoms of shortness of breath and a repeat CT scan on 07/20/2019 which showed the clot burden has decreased compared to prior CT however there is more proximal extension of thrombus on the left.  Based on these findings she was transitioned into Lovenox.  She was hospitalized for the third time in July 2021 after presenting with symptoms of hematuria and flank pain.  She had imaging studies of the abdomen and pelvis with CT renal stone study on July 8 and July 13.  No clear-cut malignancy noted at this time although further evaluation needed by urology when she is off anticoagulation.  Since her recent discharge, she is feeling better at this point with improvement in her right-sided flank pain but she still has hematuria.  She is currently on Lovenox.  She denies any previous history of bleeding or thrombosis.  She denies any DVT, superficial phlebitis.  She denies any recent travel or surgery.  She does have family history of phospholipid antibody syndrome.  She does not report any headaches, blurry vision, syncope or seizures. Does not report any fevers, chills or sweats.  Does not report any cough, wheezing or hemoptysis.  Does not report any chest pain, palpitation, orthopnea or leg edema.  Does not report any nausea, vomiting or abdominal pain.  Does not report any constipation or diarrhea.  Does not report any skeletal  complaints.    Does not report frequency, urgency or hematuria.  Does not report any skin rashes or lesions. Does not report any heat or cold intolerance.  Does not report any lymphadenopathy or petechiae.  Does not report any anxiety or depression.  Remaining review of systems is negative.    Past Medical History:  Diagnosis Date   Hypertension   :  Past Surgical History:  Procedure Laterality Date   HYSTERECTOMY ABDOMINAL WITH SALPINGO-OOPHORECTOMY  1975  :   Current Outpatient Medications:    enoxaparin (LOVENOX) 80 MG/0.8ML injection, Inject 0.8 mLs (80 mg total) into the skin every 12 (twelve) hours., Disp: 48 mL, Rfl: 0   lisinopril (ZESTRIL) 20 MG tablet, TAKE 1 TABLET BY MOUTH EVERY DAY (Patient taking differently: Take 20 mg by mouth daily. ), Disp: 90 tablet, Rfl: 0   metoprolol tartrate (LOPRESSOR) 50 MG tablet, Take 1 tablet (50 mg total) by mouth 2 (two) times daily., Disp: 60 tablet, Rfl: 0   ondansetron (ZOFRAN) 4 MG tablet, Take 1 tablet (4 mg total) by mouth daily as needed for nausea or vomiting., Disp: 30 tablet, Rfl: 0   oxyCODONE (ROXICODONE) 5 MG immediate release tablet, Take 1 tablet (5 mg total) by mouth every 4 (four) hours as needed for up to 5 days for severe pain., Disp: 20 tablet, Rfl: 0   tamsulosin (FLOMAX) 0.4 MG CAPS capsule, Take 1 capsule (0.4 mg total) by mouth daily., Disp: 30 capsule, Rfl: 0:  No Known Allergies:  Family History  Problem Relation Age of Onset  Diabetes Mother    Diabetes Father    CAD Father    Hypothyroidism Sister    Melanoma Sister    Ovarian cancer Other    Uterine cancer Other    Breast cancer Other    Diabetes Son    Uterine cancer Niece    Pancreatic cancer Nephew   :  Social History   Socioeconomic History   Marital status: Single    Spouse name: Not on file   Number of children: Not on file   Years of education: Not on file   Highest education level: Not on file  Occupational  History   Not on file  Tobacco Use   Smoking status: Former Smoker    Packs/day: 0.50    Types: Cigarettes    Quit date: 07/01/2019    Years since quitting: 0.1   Smokeless tobacco: Never Used   Tobacco comment: cutting back   Substance and Sexual Activity   Alcohol use: No   Drug use: No   Sexual activity: Not on file  Other Topics Concern   Not on file  Social History Narrative   Not on file   Social Determinants of Health   Financial Resource Strain:    Difficulty of Paying Living Expenses:   Food Insecurity:    Worried About Charity fundraiser in the Last Year:    Arboriculturist in the Last Year:   Transportation Needs:    Film/video editor (Medical):    Lack of Transportation (Non-Medical):   Physical Activity:    Days of Exercise per Week:    Minutes of Exercise per Session:   Stress:    Feeling of Stress :   Social Connections:    Frequency of Communication with Friends and Family:    Frequency of Social Gatherings with Friends and Family:    Attends Religious Services:    Active Member of Clubs or Organizations:    Attends Music therapist:    Marital Status:   Intimate Partner Violence:    Fear of Current or Ex-Partner:    Emotionally Abused:    Physically Abused:    Sexually Abused:   :  Pertinent items are noted in HPI.  Exam: Blood pressure (!) 123/50, pulse 66, temperature 97.7 F (36.5 C), temperature source Temporal, resp. rate 18, height 5\' 4"  (1.626 m), weight 160 lb 1.6 oz (72.6 kg), SpO2 98 %.  ECOG 1  General appearance: alert and cooperative appeared without distress. Head: atraumatic without any abnormalities. Eyes: conjunctivae/corneas clear. PERRL.  Sclera anicteric. Throat: lips, mucosa, and tongue normal; without oral thrush or ulcers. Resp: clear to auscultation bilaterally without rhonchi, wheezes or dullness to percussion. Cardio: regular rate and rhythm, S1, S2 normal, no murmur,  click, rub or gallop GI: soft, non-tender; bowel sounds normal; no masses,  no organomegaly Skin: Skin color, texture, turgor normal. No rashes or lesions Lymph nodes: Cervical, supraclavicular, and axillary nodes normal. Neurologic: Grossly normal without any motor, sensory or deep tendon reflexes. Musculoskeletal: No joint deformity or effusion.    DG Chest 2 View  Result Date: 07/20/2019 CLINICAL DATA:  Chest pain EXAM: CHEST - 2 VIEW COMPARISON:  07/12/2019, chest CT 07/13/2019 FINDINGS: Small right pleural effusion and moderate left pleural effusion increased compared to prior. Increased airspace disease within the left greater than right lung base. Stable cardiomediastinal silhouette. No pneumothorax. Probable chronic mild wedging deformities of mid thoracic vertebra and thoracolumbar vertebra. IMPRESSION: 1. Moderate left and  small right bilateral pleural effusions, increased compared to prior. 2. Worsening airspace disease at the left greater than right lung base. Electronically Signed   By: Donavan Foil M.D.   On: 07/20/2019 17:47   DG Chest 2 View  Result Date: 07/12/2019 CLINICAL DATA:  Left-sided chest pain. EXAM: CHEST - 2 VIEW COMPARISON:  January 07, 2004 FINDINGS: Mild atelectasis and/or infiltrate is seen within the left lung base. A stable 3 mm calcified nodular opacity is seen overlying the right lung base. There is a small left pleural effusion. No pneumothorax is identified. The heart size and mediastinal contours are within normal limits. The visualized skeletal structures are unremarkable. IMPRESSION: 1. Mild left basilar atelectasis and/or infiltrate. 2. Small left pleural effusion. Electronically Signed   By: Virgina Norfolk M.D.   On: 07/12/2019 23:55   CT Angio Chest PE W and/or Wo Contrast  Result Date: 07/20/2019 CLINICAL DATA:  Worsening chest pain with shortness of breath EXAM: CT ANGIOGRAPHY CHEST WITH CONTRAST TECHNIQUE: Multidetector CT imaging of the chest  was performed using the standard protocol during bolus administration of intravenous contrast. Multiplanar CT image reconstructions and MIPs were obtained to evaluate the vascular anatomy. CONTRAST:  26mL OMNIPAQUE IOHEXOL 350 MG/ML SOLN COMPARISON:  Chest x-ray 07/20/2019, chest CT 07/13/2019 FINDINGS: Cardiovascular: Satisfactory opacification of the pulmonary arteries to the segmental level. Pulmonary emboli are again visualized within the right lower lobar, segmental and subsegmental vessels with overall decreased clot burden compared to prior. No filling defects within the right middle or upper lobe vessels. Embolus within left lower lobe segmental and subsegmental vessels with more proximal extension of thrombus in the descending left pulmonary artery with small amount of thrombus within the distal left main pulmonary artery. RV LV ratio is non elevated at 0.7. Borderline to mild cardiomegaly. No pericardial effusion. Nonaneurysmal aorta. Mediastinum/Nodes: Midline trachea. No thyroid mass. No suspicious adenopathy. Esophagus within normal limits. Lungs/Pleura: Moderate left pleural effusion, increased compared to prior. Worsening consolidation in the left lower lobe. Atelectasis in the right base. No pneumothorax. Emphysematous disease. Upper Abdomen: No acute abnormality Musculoskeletal: Chronic wedging T7 and T12. No acute or suspicious osseous abnormality. Review of the MIP images confirms the above findings. IMPRESSION: 1. Acute bilateral pulmonary emboli are again demonstrated. Clot burden on the right appears mildly decreased compared to the prior chest CT, however there is more proximal extension of thrombus on the left with thrombus now visible in the distal left main pulmonary artery and more thrombus in the descending left pulmonary artery. RV LV ratio remains non elevated, no right heart strain by CT measurements. 2. Moderate left pleural effusion, increased compared to prior with worsening  consolidation in the left lower lobe which may reflect pneumonia, pulmonary infarct, or combination of the 2. Critical Value/emergent results were called by telephone at the time of interpretation on 07/20/2019 at 8:27 pm to provider Anda Kraft , who verbally acknowledged these results. Emphysema (ICD10-J43.9). Electronically Signed   By: Donavan Foil M.D.   On: 07/20/2019 20:27   CT Angio Chest PE W and/or Wo Contrast  Result Date: 07/13/2019 CLINICAL DATA:  PE suspected, left chest pain 10/10 radiating to left shoulder, arm and jaw. Shortness of breath. EXAM: CT ANGIOGRAPHY CHEST WITH CONTRAST TECHNIQUE: Multidetector CT imaging of the chest was performed using the standard protocol during bolus administration of intravenous contrast. Multiplanar CT image reconstructions and MIPs were obtained to evaluate the vascular anatomy. CONTRAST:  136mL OMNIPAQUE IOHEXOL 350 MG/ML SOLN COMPARISON:  Radiograph 01/07/2004,  07/12/2019 FINDINGS: Cardiovascular: Satisfactory opacification the pulmonary arteries to the segmental level. Expansile pulmonary arterial filling defect is present in the left lower lobar artery extending into the posterior and medial basal segmental branches of the left lower lobe. Additional pulmonary arterial filling defects are seen extending from the inter lobar pulmonary artery and right into the superior segmental, medial basal, and posterior basal segmental and subsegmental branches of the right lung. No significant embolic burden seen in the right upper, right middle lobe. There is central pulmonary arterial enlargement but without elevation of the RV/LV ratio which is maintained at 0.6. Some reflux of contrast into the IVC could suggest elevated right heart pressures. Suboptimal opacification of the aorta and great vessels with minimal plaque but no gross acute luminal abnormality nor periaortic stranding or hemorrhage. Normal heart size. No pericardial effusion. Mediastinum/Nodes: No mediastinal  fluid or gas. Normal thyroid gland and thoracic inlet. No acute abnormality of the trachea or esophagus. No worrisome mediastinal, hilar or axillary adenopathy. Lungs/Pleura: Dense mixed ground-glass and more peripheral consolidative opacity in the left lung is in a distribution which conforms to the areas of pulmonary embolism detailed above suspicious for developing pulmonary infarct versus infection. Small left effusion as well. Additional ground-glass in the posterior basal segment of the right lower lobe could reflect further infarct or atelectasis. More convincing atelectatic changes present in the lower lobes and lingula as well. Background of centrilobular and paraseptal emphysema. No right effusion or pneumothorax. Mild diffuse airways thickening is present with scattered secretions. Evaluation for nodularity limited by extensive respiratory motion artifact. No discernible worrisome nodules or masses. Upper Abdomen: Bilateral mild nodular thickening of the adrenal glands without concerning focal lesion may reflect some senescent hyperplasia. Fluid attenuation cyst present in the right kidney lower pole. Early excretion of contrast media noted in the kidneys. Musculoskeletal: Schmorl's node formation at T7 and mild anterior wedging at T12 are similar to comparison radiography. No acute or suspicious osseous abnormalities are identified. Review of the MIP images confirms the above findings. IMPRESSION: 1. Extensive bilateral pulmonary arterial filling defects extending from the bilateral lower lobar pulmonary arteries into the segmental and subsegmental branches as detailed above. Mild central pulmonary artery enlargement and reflux of contrast in the IVC but with a maintained RV/LV ratio could suggest some increased right heart pressures but without convincing CT evidence of right heart strain at this time. 2. Mixed ground-glass and consolidative opacity in the left lung base conforms to the areas of  perfusion supplied by the occluded pulmonary arteries above, most concerning for developing pulmonary infarct, less likely infection with small left effusion. Additional ground-glass in the posterior basal segment of the right lower lobe could reflect further infarct or atelectasis. 3. No discernible acute aortic abnormality on this non tailored examination. 4. Aortic Atherosclerosis (ICD10-I70.0) 5. Emphysema (ICD10-J43.9). These results were called by telephone at the time of interpretation on 07/13/2019 at 2:20 am to provider Dr. Wyvonnia Dusky, who verbally acknowledged these results. Electronically Signed   By: Lovena Le M.D.   On: 07/13/2019 02:21   ECHOCARDIOGRAM COMPLETE  Result Date: 07/15/2019    ECHOCARDIOGRAM REPORT   Patient Name:   Kimberly Walters Date of Exam: 07/15/2019 Medical Rec #:  [id number]     Height:       64.0 in Accession #:    [id number]    Weight:       160.0 lb Date of Birth:  1950/08/03     BSA:  1.779 m Patient Age:    43 years      BP:           121/58 mmHg Patient Gender: F             HR:           77 bpm. Exam Location:  Inpatient Procedure: 2D Echo Indications:    Abnormal ECG 94.31  History:        Patient has prior history of Echocardiogram examinations, most                 recent 08/14/2017. Risk Factors:Hypertension.  Sonographer:    Mikki Santee RDCS (AE) Referring Phys: [id number] ERIK C HOFFMAN IMPRESSIONS  1. Left ventricular ejection fraction, by estimation, is 50 to 55%. The left ventricle has low normal function. Left ventricular endocardial border not optimally defined to evaluate regional wall motion. Intermittent septal bounce, likely with respiratory cycle, and could be due to increased pulmonary pressures in setting of known acute PE. The left ventricular internal cavity size was mildly dilated. There is mild left ventricular hypertrophy. Left ventricular diastolic parameters were normal.  2. Right ventricular systolic function is grossly normal. The right  ventricular size is normal. Tricuspid regurgitation signal is inadequate for assessing PA pressure.  3. The mitral valve is normal in structure. No evidence of mitral valve regurgitation.  4. The aortic valve was not well visualized. Aortic valve regurgitation is not visualized. No aortic stenosis is present.  5. The inferior vena cava is dilated in size with >50% respiratory variability, suggesting right atrial pressure of 8 mmHg. FINDINGS  Left Ventricle: Left ventricular ejection fraction, by estimation, is 50 to 55%. The left ventricle has low normal function. Left ventricular endocardial border not optimally defined to evaluate regional wall motion. The left ventricular internal cavity  size was mildly dilated. There is mild left ventricular hypertrophy. Left ventricular diastolic parameters were normal. Right Ventricle: The right ventricular size is normal. Right vetricular wall thickness was not assessed. Right ventricular systolic function is normal. Tricuspid regurgitation signal is inadequate for assessing PA pressure. The tricuspid regurgitant velocity is 2.12 m/s, and with an assumed right atrial pressure of 8 mmHg, the estimated right ventricular systolic pressure is 96.2 mmHg. Left Atrium: Left atrial size was normal in size. Right Atrium: Right atrial size was normal in size. Pericardium: Trivial pericardial effusion is present. Mitral Valve: The mitral valve is normal in structure. No evidence of mitral valve regurgitation. Tricuspid Valve: The tricuspid valve is normal in structure. Tricuspid valve regurgitation is trivial. Aortic Valve: The aortic valve was not well visualized. Aortic valve regurgitation is not visualized. No aortic stenosis is present. Pulmonic Valve: The pulmonic valve was not well visualized. Pulmonic valve regurgitation is not visualized. Aorta: The aortic root and ascending aorta are structurally normal, with no evidence of dilitation. Venous: The inferior vena cava is dilated  in size with greater than 50% respiratory variability, suggesting right atrial pressure of 8 mmHg. IAS/Shunts: No atrial level shunt detected by color flow Doppler.  LEFT VENTRICLE PLAX 2D LVIDd:         5.30 cm  Diastology LVIDs:         3.60 cm  LV e' lateral:   9.25 cm/s LV PW:         1.00 cm  LV E/e' lateral: 7.7 LV IVS:        1.00 cm  LV e' medial:    9.03 cm/s LVOT diam:  2.40 cm  LV E/e' medial:  7.9 LV SV:         64 LV SV Index:   36 LVOT Area:     4.52 cm  RIGHT VENTRICLE RV S prime:     12.20 cm/s TAPSE (M-mode): 1.2 cm LEFT ATRIUM             Index       RIGHT ATRIUM           Index LA diam:        2.60 cm 1.46 cm/m  RA Area:     14.00 cm LA Vol (A2C):   39.0 ml 21.92 ml/m RA Volume:   33.70 ml  18.94 ml/m LA Vol (A4C):   22.4 ml 12.59 ml/m LA Biplane Vol: 31.9 ml 17.93 ml/m  AORTIC VALVE LVOT Vmax:   83.60 cm/s LVOT Vmean:  54.500 cm/s LVOT VTI:    0.141 m  AORTA Ao Root diam: 3.40 cm MITRAL VALVE               TRICUSPID VALVE MV Area (PHT): 2.26 cm    TR Peak grad:   18.0 mmHg MV Decel Time: 335 msec    TR Vmax:        212.00 cm/s MV E velocity: 71.10 cm/s MV A velocity: 79.70 cm/s  SHUNTS MV E/A ratio:  0.89        Systemic VTI:  0.14 m                            Systemic Diam: 2.40 cm Oswaldo Milian MD Electronically signed by Oswaldo Milian MD Signature Date/Time: 07/15/2019/3:21:12 PM    Final    CT RENAL STONE STUDY  Result Date: 08/03/2019 CLINICAL DATA:  Flank pain.  Kidney stone suspected. EXAM: CT ABDOMEN AND PELVIS WITHOUT CONTRAST TECHNIQUE: Multidetector CT imaging of the abdomen and pelvis was performed following the standard protocol without IV contrast. COMPARISON:  07/29/2019 FINDINGS: Lower chest: Right base subsegmental atelectasis. Persistent left base airspace disease. Small left pleural effusion is similar. Normal heart size. Hepatobiliary: Normal liver. Normal gallbladder, without biliary ductal dilatation. Pancreas: Mild pancreatic atrophy, without  duct dilatation or acute inflammation. Spleen: Normal in size, without focal abnormality. Adrenals/Urinary Tract: Left greater than right adrenal thickening. Normal noncontrast appearance of the left kidney. Lower pole right renal 5.7 cm fluid density lesion is likely a cyst. Hyperattenuating material, most consistent with hemorrhage, within the right renal collecting system and proximal right ureter. Moderate upper pole right caliectasis. Mild to moderate right hydroureter. Perinephric edema is decreased, mild. The more distal right ureter is relatively normal in caliber. Normal, partially decompressed urinary bladder. Stomach/Bowel: Normal stomach, without wall thickening. New presacral edema including on 75/5, possibly associated with the rectum. Scattered colonic diverticula, including within this region. Normal terminal ileum. Normal small bowel. Vascular/Lymphatic: Aortic atherosclerosis. No abdominopelvic adenopathy. Reproductive: Hysterectomy.  No adnexal mass. Other: No significant free fluid. Mild pelvic floor laxity. Subcutaneous gas in the anterior abdominal wall is likely iatrogenic. Musculoskeletal: Remote lower anterior right rib fracture. Mild osteopenia. Lumbosacral spondylosis. Mild L1 and T8 compression deformities are similar. IMPRESSION: 1. Similar appearance of hemorrhage within the right renal collecting system and proximal ureter. Similar right-sided hydroureteronephrosis with decrease in perinephric edema. Although this hemorrhage could be spontaneous, an underlying cause such as urothelial carcinoma is a concern. Consider urology consultation. 2. No urinary tract calculi identified. 3. New presacral edema which may  be associated with the rectosigmoid colon. Given diverticula in this region, diverticulitis is suspected. Correlate with symptomatology. 4. Similar small left pleural effusion with adjacent collapse/consolidative change which could represent atelectasis or pneumonia. 5.  Aortic  Atherosclerosis (ICD10-I70.0). Electronically Signed   By: Abigail Miyamoto M.D.   On: 08/03/2019 11:01   CT RENAL STONE STUDY  Result Date: 07/29/2019 CLINICAL DATA:  Flank pain with hematuria EXAM: CT ABDOMEN AND PELVIS WITHOUT CONTRAST TECHNIQUE: Multidetector CT imaging of the abdomen and pelvis was performed following the standard protocol without oral or IV contrast. COMPARISON:  None. FINDINGS: Lower chest: There is a left pleural effusion with consolidation in the left base. There is atelectatic change in the lateral right base. Hepatobiliary: There is a small granuloma in the right lobe of the liver. No other focal liver lesions are identified on this noncontrast enhanced study. The gallbladder wall is not appreciably thickened. There is no biliary duct dilatation. Pancreas: There is no pancreatic mass or inflammatory focus. Spleen: No splenic lesions are evident. Adrenals/Urinary Tract: The right adrenal appears normal. 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 There is perinephric stranding and perinephric fluid in the right perirenal/perinephric fascia region. There is a cyst arising from the lower pole of the right kidney measuring 6.0 x 5.5 cm. There is moderate hydronephrosis on the right. There is no hydronephrosis on the left. There is increased attenuation throughout the fluid in the right collecting system. There is edema tracking along portions of the right ureter. No ureteral calculus is evident on either side, however. Urinary bladder is midline with wall thickness within normal limits. Stomach/Bowel: There is no appreciable bowel wall or mesenteric thickening. There are multiple descending colonic and sigmoid diverticula without diverticulitis. There is no evident bowel obstruction. The terminal ileum appears normal. There is no evident free air or portal venous air. Vascular/Lymphatic: There is no abdominal aortic aneurysm. There is aortic and iliac artery  atherosclerosis. There is no appreciable adenopathy in the abdomen or pelvis. Reproductive: The uterus is absent.  No pelvic mass evident. Other: No periappendiceal region inflammation evident. No appreciable abscess or ascites in the abdomen or pelvis. There is a small umbilical hernia containing only fat. No bowel containing hernia evident. Musculoskeletal: There is degenerative change throughout the lumbar region. There is slight anterior wedging of the L1 vertebral body. There are no blastic or lytic bone lesions. There is no intramuscular lesion. IMPRESSION: 1. There is moderate hydronephrosis and proximal ureterectasis on the right. There is extensive soft tissue stranding and fluid in the right perinephric fascia region. Increased attenuation is noted in the fluid in the dilated right collecting system. No ureteral calculus is evident on the right. Question recent calculus passage on the right. Pyelonephritis may present in this manner and is a differential consideration given the appearance on the right. 2. Left pleural effusion with left base consolidation, likely pneumonia. Atelectasis lateral right base noted. 3. Multiple descending colonic and sigmoid diverticula without diverticulitis. No bowel wall thickening or bowel obstruction. No abscess in the abdomen or pelvis. No periappendiceal region inflammation. 4.  Uterus absent. 5.  Small benign left adrenal adenoma. 6.  Aortic Atherosclerosis (ICD10-I70.0). 7.  Small umbilical hernia containing fat but no bowel. These results will be called to the ordering clinician or representative by the Radiologist Assistant, and communication documented in the PACS or [employer]. Electronically Signed   By: Lowella Grip III M.D.   On: 07/29/2019 12:23  Assessment and Plan:   69 year old woman with:  1.  Bilateral pulmonary emboli likely arising from a deep vein thrombosis noted on July 13, 2019.  She was on Eliquis for a brief period of time  although had what appears to be breakthrough extension of her clot based on a CT scan on July 20, 2019 after presenting with symptoms of worsening shortness of breath.  She is currently on Lovenox at this time.  The natural course of these findings were discussed today and the treatment options moving forward were reviewed.  I have recommended continuing Lovenox for the time being for at least another 4 weeks as this is her best option to maintain a therapeutic anticoagulation without any fluctuations.  After 4 weeks of Lovenox transitioning to warfarin will be reasonable at that time.  Her hypercoagulable panel is currently pending which was obtained while she was hospitalized.  2.  Hematuria: Unclear etiology.  She is currently under evaluation by urology.  She would likely will need cystoscopy once her PE has clinically improved.  I would not interrupt her anticoagulation at this time given her current symptoms and clot burden.  Anticipate the cystoscopy can be done in another 6 weeks.  3.  Pleural effusion: Appears to be reactive in nature although malignancy cannot be completely ruled out.  I recommended continued observation at this time and repeat imaging studies which she is scheduled to have a chest x-ray in the near future.  If her pleural effusion increases then thoracentesis would be recommended.  4.  Follow-up: Will be in the next 4 weeks to follow her progress.   45  minutes were dedicated to this visit. The time was spent on reviewing laboratory data, imaging studies, discussing treatment options, discussing differential diagnosis and answering questions regarding future plan.      A copy of this consult has been forwarded to the requesting physician.

## 2019-08-10 NOTE — Assessment & Plan Note (Signed)
Failed initial treatment with Eliquis given worsening of PE despite full compliance with DOAC.  She remains on lovenox until hematology appointment scheduled for 08/10/19 to determine if hypercoagulable work-up is indicated prior to bridging to warfarin.  Will need to ensure she is UTD on all age appropriate screenings.

## 2019-08-10 NOTE — Assessment & Plan Note (Signed)
Initially seen on CT scan from 6/22 when she was first diagnosed with PE and pulmonary infarct. Suspect residual effusion is from inflammation related to this and should resolve with time. Will obtain CXR in 2 weeks to monitor. Her hypoxia has resolved, and she is doing well off of supplemental oxygen.

## 2019-08-11 DIAGNOSIS — I2699 Other pulmonary embolism without acute cor pulmonale: Secondary | ICD-10-CM | POA: Diagnosis not present

## 2019-08-11 DIAGNOSIS — Z9981 Dependence on supplemental oxygen: Secondary | ICD-10-CM | POA: Diagnosis not present

## 2019-08-11 DIAGNOSIS — Z7901 Long term (current) use of anticoagulants: Secondary | ICD-10-CM | POA: Diagnosis not present

## 2019-08-11 DIAGNOSIS — I10 Essential (primary) hypertension: Secondary | ICD-10-CM | POA: Diagnosis not present

## 2019-08-11 DIAGNOSIS — F17210 Nicotine dependence, cigarettes, uncomplicated: Secondary | ICD-10-CM | POA: Diagnosis not present

## 2019-08-11 DIAGNOSIS — I480 Paroxysmal atrial fibrillation: Secondary | ICD-10-CM | POA: Diagnosis not present

## 2019-08-11 DIAGNOSIS — Z9181 History of falling: Secondary | ICD-10-CM | POA: Diagnosis not present

## 2019-08-11 NOTE — Progress Notes (Signed)
Internal Medicine Clinic Attending  Case discussed with Dr. Bloomfield  At the time of the visit.  We reviewed the resident's history and exam and pertinent patient test results.  I agree with the assessment, diagnosis, and plan of care documented in the resident's note.  

## 2019-08-12 ENCOUNTER — Other Ambulatory Visit: Payer: Self-pay | Admitting: *Deleted

## 2019-08-12 MED ORDER — ENOXAPARIN SODIUM 80 MG/0.8ML ~~LOC~~ SOLN: 80.0000 mg | Freq: Two times a day (BID) | SUBCUTANEOUS | 0 refills | Status: DC

## 2019-08-12 NOTE — Telephone Encounter (Signed)
Patient called in stating she saw Dr. Alen Blew yesterday and was told she would need to continue taking lovenox for 3-4 more weeks. Only has enough to last through Monday, 08/16/2019. Requesting refill at [facility] on [address]. Hubbard Hartshorn, BSN, RN-BC

## 2019-08-13 ENCOUNTER — Telehealth: Payer: Self-pay | Admitting: Oncology

## 2019-08-13 ENCOUNTER — Telehealth: Payer: Self-pay | Admitting: *Deleted

## 2019-08-13 ENCOUNTER — Telehealth: Payer: Self-pay

## 2019-08-13 DIAGNOSIS — I2694 Multiple subsegmental pulmonary emboli without acute cor pulmonale: Secondary | ICD-10-CM

## 2019-08-13 NOTE — Telephone Encounter (Signed)
Requesting to speak with a nurse about meds.  

## 2019-08-13 NOTE — Telephone Encounter (Signed)
Returned call to patient. States she was told that her insurance has approved refill of lovenox but [facility] has told her that the doctor has not authorized it. Explained the doctor sent refill yesterday AM but it required a PA which has been submitted today to [facility]. Response time is 24-72 hours. She has enough lovenox to get her through Monday 08/16/2019. Hubbard Hartshorn, BSN, RN-BC

## 2019-08-13 NOTE — Telephone Encounter (Signed)
Of note, pt's insurance will pay for once daily dosing, but rx is for twice daily dosing, therefore plan is requiring a PA for qty limit override.  Clinic staff has already submitted info to insurance (see 08/13/19 telephone note) and awaiting decision.Despina Hidden Cassady7/23/202111:56 AM

## 2019-08-13 NOTE — Telephone Encounter (Signed)
Pls contact pt [phone] regarding medicine

## 2019-08-13 NOTE — Telephone Encounter (Signed)
Call to [facility] at [phone] for PA for Enoxaparin 80mg /0.8 ml Syringes.  Awaiting determination within 24-72 hours.  Ref # [id number].Sander Nephew, RN  08/13/2019 10:39 AM

## 2019-08-13 NOTE — Telephone Encounter (Signed)
Call from [employer] about Lovenox PA. Informed it's being worked on (per [employer]).

## 2019-08-13 NOTE — Telephone Encounter (Signed)
Scheduled per 07/20 los, patient has been called and notified. 

## 2019-08-15 DIAGNOSIS — J439 Emphysema, unspecified: Secondary | ICD-10-CM | POA: Diagnosis not present

## 2019-08-16 NOTE — Telephone Encounter (Signed)
Pt aware.   Pt also requesting order to d/c home oxygen.  States she is no longer using it and tired of having to pay for it. Will send request to appropriate team member for review.Despina Hidden Cassady7/26/20213:19 PM  Once d/c order given-it will need to be faxed and/or community messaged to [employer]

## 2019-08-16 NOTE — Telephone Encounter (Signed)
Medication has been approved until 01/21/2020.  Sander Nephew, RN 08/16/2019 10:32 AM.

## 2019-08-16 NOTE — Telephone Encounter (Addendum)
Information for Enoxaparin 80mg /0.8 ml syringes was sent on 08/13/2019.  PA for Enoxaparin 80 mg/0.8 ml syringes has been approved until 01/21/2020.  Sander Nephew, RN 08/16/2019 10:35 AM.

## 2019-08-17 DIAGNOSIS — F17210 Nicotine dependence, cigarettes, uncomplicated: Secondary | ICD-10-CM | POA: Diagnosis not present

## 2019-08-17 DIAGNOSIS — Z9981 Dependence on supplemental oxygen: Secondary | ICD-10-CM | POA: Diagnosis not present

## 2019-08-17 DIAGNOSIS — I2699 Other pulmonary embolism without acute cor pulmonale: Secondary | ICD-10-CM | POA: Diagnosis not present

## 2019-08-17 DIAGNOSIS — I480 Paroxysmal atrial fibrillation: Secondary | ICD-10-CM | POA: Diagnosis not present

## 2019-08-17 DIAGNOSIS — Z9181 History of falling: Secondary | ICD-10-CM | POA: Diagnosis not present

## 2019-08-17 DIAGNOSIS — I10 Essential (primary) hypertension: Secondary | ICD-10-CM | POA: Diagnosis not present

## 2019-08-17 DIAGNOSIS — Z7901 Long term (current) use of anticoagulants: Secondary | ICD-10-CM | POA: Diagnosis not present

## 2019-08-17 NOTE — Telephone Encounter (Signed)
Order placed by MD to d/c home O2 at pt's request.  Community message sent to [employer].Despina Hidden Cassady7/27/20219:25 AM

## 2019-08-18 ENCOUNTER — Ambulatory Visit (HOSPITAL_COMMUNITY)
Admission: RE | Admit: 2019-08-18 | Discharge: 2019-08-18 | Disposition: A | Discharge status: Home / Self Care | Payer: Medicare HMO | Source: Ambulatory Visit | Attending: Internal Medicine | Admitting: Internal Medicine

## 2019-08-18 ENCOUNTER — Other Ambulatory Visit: Payer: Self-pay

## 2019-08-18 DIAGNOSIS — J90 Pleural effusion, not elsewhere classified: Secondary | ICD-10-CM | POA: Diagnosis not present

## 2019-08-19 ENCOUNTER — Other Ambulatory Visit: Payer: Self-pay | Admitting: Internal Medicine

## 2019-08-19 DIAGNOSIS — Z9981 Dependence on supplemental oxygen: Secondary | ICD-10-CM | POA: Diagnosis not present

## 2019-08-19 DIAGNOSIS — I2699 Other pulmonary embolism without acute cor pulmonale: Secondary | ICD-10-CM | POA: Diagnosis not present

## 2019-08-19 DIAGNOSIS — Z7901 Long term (current) use of anticoagulants: Secondary | ICD-10-CM | POA: Diagnosis not present

## 2019-08-19 DIAGNOSIS — I10 Essential (primary) hypertension: Secondary | ICD-10-CM | POA: Diagnosis not present

## 2019-08-19 DIAGNOSIS — F17210 Nicotine dependence, cigarettes, uncomplicated: Secondary | ICD-10-CM | POA: Diagnosis not present

## 2019-08-19 DIAGNOSIS — I480 Paroxysmal atrial fibrillation: Secondary | ICD-10-CM | POA: Diagnosis not present

## 2019-08-19 DIAGNOSIS — Z9181 History of falling: Secondary | ICD-10-CM | POA: Diagnosis not present

## 2019-08-23 ENCOUNTER — Other Ambulatory Visit: Payer: Self-pay

## 2019-08-23 DIAGNOSIS — M47817 Spondylosis without myelopathy or radiculopathy, lumbosacral region: Secondary | ICD-10-CM | POA: Diagnosis not present

## 2019-08-23 DIAGNOSIS — J439 Emphysema, unspecified: Secondary | ICD-10-CM | POA: Diagnosis not present

## 2019-08-23 DIAGNOSIS — I2699 Other pulmonary embolism without acute cor pulmonale: Secondary | ICD-10-CM | POA: Diagnosis not present

## 2019-08-23 DIAGNOSIS — I480 Paroxysmal atrial fibrillation: Secondary | ICD-10-CM | POA: Diagnosis not present

## 2019-08-23 DIAGNOSIS — M439 Deforming dorsopathy, unspecified: Secondary | ICD-10-CM | POA: Diagnosis not present

## 2019-08-23 DIAGNOSIS — B951 Streptococcus, group B, as the cause of diseases classified elsewhere: Secondary | ICD-10-CM | POA: Diagnosis not present

## 2019-08-23 DIAGNOSIS — I10 Essential (primary) hypertension: Secondary | ICD-10-CM | POA: Diagnosis not present

## 2019-08-23 DIAGNOSIS — K5730 Diverticulosis of large intestine without perforation or abscess without bleeding: Secondary | ICD-10-CM | POA: Diagnosis not present

## 2019-08-23 DIAGNOSIS — N136 Pyonephrosis: Secondary | ICD-10-CM | POA: Diagnosis not present

## 2019-08-23 MED ORDER — METOPROLOL TARTRATE 50 MG PO TABS: 50.0000 mg | ORAL_TABLET | Freq: Two times a day (BID) | ORAL | 1 refills | Status: DC

## 2019-08-23 NOTE — Telephone Encounter (Signed)
metoprolol tartrate (LOPRESSOR) 50 MG tablet(Expired, refill request @  [id number] - [place], [address] AT [address] Phone:  [phone]  Fax:  [phone]

## 2019-08-26 DIAGNOSIS — M439 Deforming dorsopathy, unspecified: Secondary | ICD-10-CM | POA: Diagnosis not present

## 2019-08-26 DIAGNOSIS — K5730 Diverticulosis of large intestine without perforation or abscess without bleeding: Secondary | ICD-10-CM | POA: Diagnosis not present

## 2019-08-26 DIAGNOSIS — N136 Pyonephrosis: Secondary | ICD-10-CM | POA: Diagnosis not present

## 2019-08-26 DIAGNOSIS — I480 Paroxysmal atrial fibrillation: Secondary | ICD-10-CM | POA: Diagnosis not present

## 2019-08-26 DIAGNOSIS — M47817 Spondylosis without myelopathy or radiculopathy, lumbosacral region: Secondary | ICD-10-CM | POA: Diagnosis not present

## 2019-08-26 DIAGNOSIS — J439 Emphysema, unspecified: Secondary | ICD-10-CM | POA: Diagnosis not present

## 2019-08-26 DIAGNOSIS — I2699 Other pulmonary embolism without acute cor pulmonale: Secondary | ICD-10-CM | POA: Diagnosis not present

## 2019-08-26 DIAGNOSIS — I10 Essential (primary) hypertension: Secondary | ICD-10-CM | POA: Diagnosis not present

## 2019-08-26 DIAGNOSIS — B951 Streptococcus, group B, as the cause of diseases classified elsewhere: Secondary | ICD-10-CM | POA: Diagnosis not present

## 2019-08-30 ENCOUNTER — Ambulatory Visit (INDEPENDENT_AMBULATORY_CARE_PROVIDER_SITE_OTHER): Payer: Medicare HMO | Admitting: Internal Medicine

## 2019-08-30 ENCOUNTER — Other Ambulatory Visit: Payer: Self-pay

## 2019-08-30 ENCOUNTER — Encounter: Payer: Self-pay | Admitting: Internal Medicine

## 2019-08-30 VITALS — BP 132/76 | HR 64 | Temp 97.7°F | Ht 64.0 in | Wt 161.1 lb

## 2019-08-30 DIAGNOSIS — J439 Emphysema, unspecified: Secondary | ICD-10-CM | POA: Diagnosis not present

## 2019-08-30 DIAGNOSIS — R310 Gross hematuria: Secondary | ICD-10-CM

## 2019-08-30 DIAGNOSIS — M439 Deforming dorsopathy, unspecified: Secondary | ICD-10-CM | POA: Diagnosis not present

## 2019-08-30 DIAGNOSIS — I700 Atherosclerosis of aorta: Secondary | ICD-10-CM

## 2019-08-30 DIAGNOSIS — I10 Essential (primary) hypertension: Secondary | ICD-10-CM | POA: Diagnosis not present

## 2019-08-30 DIAGNOSIS — M47817 Spondylosis without myelopathy or radiculopathy, lumbosacral region: Secondary | ICD-10-CM | POA: Diagnosis not present

## 2019-08-30 DIAGNOSIS — N136 Pyonephrosis: Secondary | ICD-10-CM | POA: Diagnosis not present

## 2019-08-30 DIAGNOSIS — I2694 Multiple subsegmental pulmonary emboli without acute cor pulmonale: Secondary | ICD-10-CM

## 2019-08-30 DIAGNOSIS — I2699 Other pulmonary embolism without acute cor pulmonale: Secondary | ICD-10-CM | POA: Diagnosis not present

## 2019-08-30 DIAGNOSIS — I480 Paroxysmal atrial fibrillation: Secondary | ICD-10-CM | POA: Diagnosis not present

## 2019-08-30 DIAGNOSIS — K5730 Diverticulosis of large intestine without perforation or abscess without bleeding: Secondary | ICD-10-CM | POA: Diagnosis not present

## 2019-08-30 DIAGNOSIS — B951 Streptococcus, group B, as the cause of diseases classified elsewhere: Secondary | ICD-10-CM | POA: Diagnosis not present

## 2019-08-30 MED ORDER — PRAVASTATIN SODIUM 10 MG PO TABS: 10.0000 mg | ORAL_TABLET | Freq: Every day | ORAL | 3 refills | Status: DC

## 2019-08-30 NOTE — Patient Instructions (Signed)
Thank you for trusting me with your care. To recap, today we discussed the following:   1. Aortic atherosclerosis (HCC)  - pravastatin (PRAVACHOL) 10 MG tablet; Take 1 tablet (10 mg total) by mouth daily.  Dispense: 90 tablet; Refill: 3  2. Gross hematuria - Ambulatory referral to Urology   Stop taking flomax and metoprolol.

## 2019-08-30 NOTE — Assessment & Plan Note (Signed)
On review of CT Angio aortic atherosclerosis noted. Patient is not currently on statin. This is for primary prevention. The 10-year ASCVD risk score Mikey Bussing DC Brooke Bonito., et al., 2013) is: 11.2%   Values used to calculate the score:     Age: 69 years     Sex: Female     Is Non-Hispanic African American: No     Diabetic: No     Tobacco smoker: No     Systolic Blood Pressure: 754 mmHg     Is BP treated: Yes     HDL Cholesterol: 58 mg/dL     Total Cholesterol: 168 mg/dL  Assessment: Aortic atherosclerosis Plan: - Pravastatin 10 mg

## 2019-08-30 NOTE — Progress Notes (Signed)
   CC: hematuria and PE  HPI:Ms.NARA PATERNOSTER is a 69 y.o. female who presents for evaluation of hematuria and recent PE . Please see individual problem based A/P for details.  Past Medical History:  Diagnosis Date  . Hypertension    Review of Systems:  ROS negative except as mentioned in individual problem based A/P.    Physical Exam: Vitals:   08/30/19 1019  BP: 132/76  Pulse: 64  Temp: 97.7 F (36.5 C)  TempSrc: Oral  SpO2: 96%  Weight: 161 lb 1.6 oz (73.1 kg)  Height: 5\' 4"  (1.626 m)    General: NAD, nl appearance HE: Normocephalic, atraumatic , EOMI, Conjunctivae normal ENT: No congestion, no rhinorrhea, no exudate or erythema  Cardiovascular: Normal rate, regular rhythm.  No murmurs, rubs, or gallops Pulmonary : Effort normal, breath sounds normal. No wheezes, rales, or rhonchi Abdominal: soft, nontender,  bowel sounds present Musculoskeletal: Trace swelling in left leg, left leg edema worse than right ( chronic)  Skin: Warm, dry , no bruising, erythema, or rash Psychiatric/Behavioral:  normal mood, normal behavior   Assessment & Plan:   See Encounters Tab for problem based charting.  Patient discussed with Dr. Angelia Mould

## 2019-08-31 ENCOUNTER — Encounter: Payer: Self-pay | Admitting: Internal Medicine

## 2019-08-31 NOTE — Assessment & Plan Note (Signed)
Reports she has follow up with Hematology next week for results of her hypercoagulable work up . On Lovenox shots currently.  FOBT ordered in February but no result. Plan: Next visit follow up on FOBT or Colonoscopy

## 2019-08-31 NOTE — Assessment & Plan Note (Signed)
Patient is on Flomax to help with passing blood clots. Her gross hematuria has cleared up . The further workup with cystoscopy has been on hold while waiting on her hypercoagulable workup. The results will be review with patient at her upcoming appointment in a week. She will need to follow up with urology to insure there is no underlying malignancy and the gross hematuria was secondary to kidney stone.  Plan Referral to urology  Stop Flomax

## 2019-09-01 DIAGNOSIS — B951 Streptococcus, group B, as the cause of diseases classified elsewhere: Secondary | ICD-10-CM | POA: Diagnosis not present

## 2019-09-01 DIAGNOSIS — N136 Pyonephrosis: Secondary | ICD-10-CM | POA: Diagnosis not present

## 2019-09-01 DIAGNOSIS — I480 Paroxysmal atrial fibrillation: Secondary | ICD-10-CM | POA: Diagnosis not present

## 2019-09-01 DIAGNOSIS — M47817 Spondylosis without myelopathy or radiculopathy, lumbosacral region: Secondary | ICD-10-CM | POA: Diagnosis not present

## 2019-09-01 DIAGNOSIS — I10 Essential (primary) hypertension: Secondary | ICD-10-CM | POA: Diagnosis not present

## 2019-09-01 DIAGNOSIS — J439 Emphysema, unspecified: Secondary | ICD-10-CM | POA: Diagnosis not present

## 2019-09-01 DIAGNOSIS — K5730 Diverticulosis of large intestine without perforation or abscess without bleeding: Secondary | ICD-10-CM | POA: Diagnosis not present

## 2019-09-01 DIAGNOSIS — M439 Deforming dorsopathy, unspecified: Secondary | ICD-10-CM | POA: Diagnosis not present

## 2019-09-01 DIAGNOSIS — I2699 Other pulmonary embolism without acute cor pulmonale: Secondary | ICD-10-CM | POA: Diagnosis not present

## 2019-09-01 NOTE — Progress Notes (Signed)
Internal Medicine Clinic Attending  Case discussed with Dr. Steen  At the time of the visit.  We reviewed the resident's history and exam and pertinent patient test results.  I agree with the assessment, diagnosis, and plan of care documented in the resident's note.  

## 2019-09-06 ENCOUNTER — Telehealth: Payer: Self-pay | Admitting: Oncology

## 2019-09-06 ENCOUNTER — Other Ambulatory Visit: Payer: Self-pay

## 2019-09-06 ENCOUNTER — Inpatient Hospital Stay: Payer: Medicare HMO | Attending: Oncology | Admitting: Oncology

## 2019-09-06 ENCOUNTER — Encounter: Payer: Medicare HMO | Admitting: Internal Medicine

## 2019-09-06 VITALS — BP 152/79 | HR 74 | Temp 98.0°F | Resp 18 | Wt 163.3 lb

## 2019-09-06 DIAGNOSIS — I2694 Multiple subsegmental pulmonary emboli without acute cor pulmonale: Secondary | ICD-10-CM

## 2019-09-06 DIAGNOSIS — J90 Pleural effusion, not elsewhere classified: Secondary | ICD-10-CM | POA: Insufficient documentation

## 2019-09-06 DIAGNOSIS — Z86711 Personal history of pulmonary embolism: Secondary | ICD-10-CM | POA: Insufficient documentation

## 2019-09-06 DIAGNOSIS — Z86718 Personal history of other venous thrombosis and embolism: Secondary | ICD-10-CM | POA: Insufficient documentation

## 2019-09-06 DIAGNOSIS — Z7901 Long term (current) use of anticoagulants: Secondary | ICD-10-CM | POA: Insufficient documentation

## 2019-09-06 NOTE — Progress Notes (Signed)
Hematology and Oncology Follow Up Visit  Kimberly Walters [id number] July 28, 1950 69 y.o. 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  She was found to have deep vein thrombosis and subsequently developed PE.   Prior Therapy:  She was treated with Eliquis initially but developed PE in June 2021 while she was on Eliquis indicating breakthrough thrombosis.  Current therapy:  Lovenox 80 mg twice a day started in June 2021.   Interim History: Kimberly Walters returns today for a follow-up visit.  Since her last visit, she reports no major changes in her health.  She continues to administer Lovenox without any complications.  She denies any hematuria, dysuria or flank pain.  She denies any hospitalization or illnesses.     Medications: I have reviewed the patient's current medications.  Current Outpatient Medications  Medication Sig Dispense Refill  . enoxaparin (LOVENOX) 80 MG/0.8ML injection Inject 0.8 mLs (80 mg total) into the skin every 12 (twelve) hours. 48 mL 0  . lisinopril (ZESTRIL) 20 MG tablet TAKE 1 TABLET BY MOUTH EVERY DAY 90 tablet 1  . pravastatin (PRAVACHOL) 10 MG tablet Take 1 tablet (10 mg total) by mouth daily. 90 tablet 3   No current facility-administered medications for this visit.     Allergies: No Known Allergies    Physical Exam: Blood pressure (!) 152/79, pulse 74, temperature 98 F (36.7 C), temperature source Oral, resp. rate 18, weight 163 lb 4.8 oz (74.1 kg), SpO2 95 %.   ECOG: 1   General appearance: Comfortable appearing without any discomfort Head: Normocephalic without any trauma Oropharynx: Mucous membranes are moist and pink without any thrush or ulcers. Eyes: Pupils are equal and round reactive to light. Lymph nodes: No cervical, supraclavicular, inguinal or axillary lymphadenopathy.   Heart:regular rate and rhythm.  S1  and S2.  Trace edema noted. Lung: Clear without any rhonchi or wheezes.  No dullness to percussion. Abdomin: Soft, nontender, nondistended with good bowel sounds.  No hepatosplenomegaly. Musculoskeletal: No joint deformity or effusion.  Full range of motion noted. Neurological: No deficits noted on motor, sensory and deep tendon reflex exam. Skin: No petechial rash or dryness.  Appeared moist.      Lab Results: Lab Results  Component Value Date   WBC 7.5 08/05/2019   HGB 9.3 (L) 08/05/2019   HCT 30.2 (L) 08/05/2019   MCV 94.4 08/05/2019   PLT 288 08/05/2019     Chemistry      Component Value Date/Time   NA 141 08/05/2019 0930   NA 141 11/16/2018 1403   K 3.5 08/05/2019 0930   CL 103 08/05/2019 0930   CO2 29 08/05/2019 0930   BUN 13 08/05/2019 0930   BUN 17 11/16/2018 1403   CREATININE 1.13 (H) 08/05/2019 0930      Component Value Date/Time   CALCIUM 8.4 (L) 08/05/2019 0930   ALKPHOS 59 07/31/2019 0639   AST 16 07/31/2019 0639   ALT 29 07/31/2019 0639   BILITOT 0.3 07/31/2019 0639   BILITOT 0.3 11/16/2018 1403       Impression and Plan:  69 year old woman with:  1.    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    She presented initially with DVT subsequently developed PE on Eliquis.  The natural course of inherited and acquired venous thromboembolism were discussed at this time.  Treatment options were reviewed.  I have recommended obtaining a hypercoagulable  work-up which was not completed while she is hospitalized. .  Risks and benefits of continuing Lovenox versus transitioning to warfarin were reviewed.  She is agreeable to continue with Lovenox at this time.  We will evaluate her in 5 months to assess her overall clinical status.   2.  Hematuria: Resolved currently and she will have follow-up with urology regarding this issue.    3.  Pleural effusion: Discovered during her hospitalization appears to be reactive.  4.  Follow-up: Will be in  January 2021.   30  minutes were dedicated to this encounter.  The time was spent on reviewing her disease status, treatment options and addressing complications noted to therapy.   Zola Button, MD 8/16/20219:46 AM

## 2019-09-06 NOTE — Telephone Encounter (Signed)
Scheduled per 08/16 los, patient has been called and notified.  

## 2019-09-09 ENCOUNTER — Other Ambulatory Visit: Payer: Self-pay | Admitting: *Deleted

## 2019-09-09 DIAGNOSIS — I700 Atherosclerosis of aorta: Secondary | ICD-10-CM

## 2019-09-09 DIAGNOSIS — I10 Essential (primary) hypertension: Secondary | ICD-10-CM

## 2019-09-09 MED ORDER — LISINOPRIL 20 MG PO TABS: 20.0000 mg | ORAL_TABLET | Freq: Every day | ORAL | 3 refills | Status: DC

## 2019-09-09 MED ORDER — ENOXAPARIN SODIUM 80 MG/0.8ML ~~LOC~~ SOLN: 80.0000 mg | Freq: Two times a day (BID) | SUBCUTANEOUS | 1 refills | Status: DC

## 2019-09-09 MED ORDER — PRAVASTATIN SODIUM 10 MG PO TABS: 10.0000 mg | ORAL_TABLET | Freq: Every day | ORAL | 3 refills | Status: DC

## 2019-09-14 ENCOUNTER — Other Ambulatory Visit: Payer: Self-pay | Admitting: Internal Medicine

## 2019-10-12 DIAGNOSIS — R310 Gross hematuria: Secondary | ICD-10-CM | POA: Diagnosis not present

## 2019-10-14 ENCOUNTER — Ambulatory Visit (INDEPENDENT_AMBULATORY_CARE_PROVIDER_SITE_OTHER): Payer: Medicare HMO | Admitting: Internal Medicine

## 2019-10-14 ENCOUNTER — Encounter: Payer: Self-pay | Admitting: Internal Medicine

## 2019-10-14 ENCOUNTER — Other Ambulatory Visit: Payer: Self-pay

## 2019-10-14 VITALS — BP 113/55 | HR 96 | Temp 98.7°F | Ht 64.0 in | Wt 169.0 lb

## 2019-10-14 DIAGNOSIS — M7989 Other specified soft tissue disorders: Secondary | ICD-10-CM

## 2019-10-14 DIAGNOSIS — R140 Abdominal distension (gaseous): Secondary | ICD-10-CM

## 2019-10-14 MED ORDER — SIMETHICONE 80 MG PO CHEW: 80.0000 mg | CHEWABLE_TABLET | Freq: Four times a day (QID) | ORAL | 2 refills | Status: DC | PRN

## 2019-10-14 NOTE — Progress Notes (Signed)
   CC: Leg Swelling  HPI:  [name] is a 69 y.o. female, with a PMH noted below, to see the management of her acute and chronic conditions, please see the A&P note under the encounters tab.   Past Medical History:  Diagnosis Date  . Hypertension    Review of Systems:   Review of Systems  Constitutional: Negative for chills, fever, malaise/fatigue and weight loss.  Cardiovascular: Negative for chest pain, palpitations and orthopnea.  Gastrointestinal: Negative for abdominal pain, constipation, diarrhea, nausea and vomiting.       Bloating  Musculoskeletal:       L leg swelling    Physical Exam:  Vitals:   10/14/19 1434  BP: (!) 113/55  Pulse: 96  Temp: 98.7 F (37.1 C)  TempSrc: Oral  SpO2: 95%  Weight: 169 lb (76.7 kg)  Height: 5\' 4"  (1.626 m)   Physical Exam Vitals and nursing note reviewed.  Constitutional:      General: She is not in acute distress.    Appearance: Normal appearance. She is not ill-appearing or toxic-appearing.  HENT:     Head: Normocephalic and atraumatic.  Cardiovascular:     Rate and Rhythm: Normal rate and regular rhythm.     Pulses: Normal pulses.     Heart sounds: Normal heart sounds. No murmur heard.  No friction rub. No gallop.   Pulmonary:     Effort: Pulmonary effort is normal.     Breath sounds: Normal breath sounds. No wheezing, rhonchi or rales.  Abdominal:     General: Abdomen is flat. Bowel sounds are normal.     Tenderness: There is no abdominal tenderness. There is no guarding.  Musculoskeletal:        General: Swelling present.     Right lower leg: Edema present.     Left lower leg: Edema present.     Comments: Trace pitting edema in the RLE, 1+ pitting edema in the LLE. Multiple varicose veins appreciated bilaterally. Homan's sign negative bilaterally.    Skin:    General: Skin is warm.  Neurological:     Mental Status: She is alert and oriented to person, place, and time.  Psychiatric:        Mood and Affect:  Mood normal.        Behavior: Behavior normal.     Assessment & Plan:   See Encounters Tab for problem based charting.  Patient discussed with Dr. Dareen Piano

## 2019-10-14 NOTE — Patient Instructions (Signed)
Ms. Hegner,  It was a pleasure meeting you today! Today we discussed your left leg swelling. We are going to order some imaging to rule out a blood clot. If there is no blood clot found, we will fit you with compression stocking for your left leg. Have a good day! We will see you in 3 months time.  Sincerely,  Maudie Mercury, MD

## 2019-10-15 ENCOUNTER — Encounter: Payer: Self-pay | Admitting: Internal Medicine

## 2019-10-15 DIAGNOSIS — R140 Abdominal distension (gaseous): Secondary | ICD-10-CM | POA: Insufficient documentation

## 2019-10-15 NOTE — Progress Notes (Signed)
Internal Medicine Clinic Attending ? ?Case discussed with Dr. Winters  At the time of the visit.  We reviewed the resident?s history and exam and pertinent patient test results.  I agree with the assessment, diagnosis, and plan of care documented in the resident?s note.  ?

## 2019-10-15 NOTE — Assessment & Plan Note (Addendum)
Patient with PMH of PE presents today with LLE swelling. She did not endorse recent long travelled distances, immobility, or active cancer. Swelling is virtually nonexistent in the morning, but starts as soon as she brings her granchild to school. There is no pain or tenderness that she notes.  On physical exam she does have 1+ pitting edema, with multiple varicose veins, negative homan's. Given her age this may be venous insufficiency, but her previous PE, and breakthrough PE, came from her LLE. Will get a DVT study to r/o clot. If negative will order compression stockings.   - DVT study of LLE  - Compression stockings pending result.

## 2019-10-15 NOTE — Assessment & Plan Note (Signed)
Patient presents with a week of bloating. She states that she has not changed any medications, or dietary habits. Her physical examination is benign. BS+, non-tender to palpation. Discussed continuing to monitor. She has not tried medications for her bloating. She states she cannot drink Pepto bismol or Maalox due to having an allergic reaction to her bowel prep for a previous colonoscopy and has avoided liquid GI medications since. She is amendable to a pill. - Continued to monitor, patient given return precautions.  - Gas-X 80 mg 4 times daily as needed

## 2019-10-18 ENCOUNTER — Other Ambulatory Visit: Payer: Self-pay

## 2019-10-18 ENCOUNTER — Ambulatory Visit (HOSPITAL_COMMUNITY)
Admission: RE | Admit: 2019-10-18 | Discharge: 2019-10-18 | Disposition: A | Discharge status: Home / Self Care | Payer: Medicare HMO | Source: Ambulatory Visit | Attending: Internal Medicine | Admitting: Internal Medicine

## 2019-10-18 DIAGNOSIS — M7989 Other specified soft tissue disorders: Secondary | ICD-10-CM | POA: Diagnosis not present

## 2019-10-18 NOTE — Progress Notes (Addendum)
VASCULAR LAB    Left lower extremity venous duplex has been performed.  See CV proc for preliminary results.  Paged [phone], with no response at 0930. Messaged Dr. Gilford Rile via [facility], [facility], RVT 10/18/2019, 9:31 AM

## 2019-10-25 DIAGNOSIS — R310 Gross hematuria: Secondary | ICD-10-CM | POA: Diagnosis not present

## 2019-10-25 DIAGNOSIS — N281 Cyst of kidney, acquired: Secondary | ICD-10-CM | POA: Diagnosis not present

## 2019-10-25 DIAGNOSIS — I700 Atherosclerosis of aorta: Secondary | ICD-10-CM | POA: Diagnosis not present

## 2019-10-25 DIAGNOSIS — N200 Calculus of kidney: Secondary | ICD-10-CM | POA: Diagnosis not present

## 2019-10-29 ENCOUNTER — Encounter: Payer: Self-pay | Admitting: *Deleted

## 2019-10-29 NOTE — Progress Notes (Unsigned)

## 2019-11-02 ENCOUNTER — Other Ambulatory Visit: Payer: Self-pay | Admitting: Internal Medicine

## 2019-11-02 NOTE — Progress Notes (Signed)
Things That May Be Affecting Your Health:  Alcohol x Hearing loss  Pain   x Depression  Home Safety  Sexual Health   Diabetes  Lack of physical activity  Stress  x Difficulty with daily activities  Loneliness  Tiredness   Drug use x Medicines  Tobacco use   Falls  Motor Vehicle Safety  Weight   Food choices  Oral Health  Other    YOUR PERSONALIZED HEALTH PLAN : 1. Schedule your next subsequent Medicare Wellness visit in one year 2. Attend all of your regular appointments to address your medical issues 3. Complete the preventative screenings and services   Annual Wellness Visit   Medicare Covered Preventative Screenings and [place] Men and Women Who How Often Need? Date of Last Service Action  Abdominal Aortic Aneurysm Adults with AAA risk factors Once     Alcohol Misuse and Counseling All Adults Screening once a year if no alcohol misuse. Counseling up to 4 face to face sessions.     Bone Density Measurement  Adults at risk for osteoporosis Once every 2 yrs     Lipid Panel Z13.6 All adults without CV disease Once every 5 yrs     Colorectal Cancer   Stool sample or  Colonoscopy All adults 75 and older   Once every year  Every 10 years x    Depression All Adults Once a year  Today   Diabetes Screening Blood glucose, post glucose load, or GTT Z13.1  All adults at risk  Pre-diabetics  Once per year  Twice per year     Diabetes  Self-Management Training All adults Diabetics 10 hrs first year; 2 hours subsequent years. Requires Copay     Glaucoma  Diabetics  Family history of glaucoma  African Americans 57 yrs +  Hispanic Americans 33 yrs + Annually - requires coppay     Hepatitis C Z72.89 or F19.20  High Risk for HCV  Born between 1945 and 1965  Annually  Once x    HIV Z11.4 All adults based on risk  Annually btw ages 79 & 76 regardless of risk  Annually > 65 yrs if at increased risk     Lung Cancer Screening Asymptomatic adults  aged 70-77 with 30 pack yr history and current smoker OR quit within the last 15 yrs Annually Must have counseling and shared decision making documentation before first screen     Medical Nutrition Therapy Adults with   Diabetes  Renal disease  Kidney transplant within past 3 yrs 3 hours first year; 2 hours subsequent years     Obesity and Counseling All adults Screening once a year Counseling if BMI 30 or higher  Today   Tobacco Use Counseling Adults who use tobacco  Up to 8 visits in one year     Vaccines Z23  Hepatitis B  Influenza   Pneumonia  Adults   Once  Once every flu season  Two different vaccines separated by one year x    Next Annual Wellness Visit People with Medicare Every year  Today     Services & Screenings Women Who How Often Need  Date of Last Service Action  Mammogram  Z12.31 Women over 53 One baseline ages 37-39. Annually ager 40 yrs+     Pap tests All women Annually if high risk. Every 2 yrs for normal risk women     Screening for cervical cancer with   Pap (Z01.419 nl or Z01.411abnl) &  HPV Z11.51 Women aged 51 to 43 Once every 5 yrs     Screening pelvic and breast exams All women Annually if high risk. Every 2 yrs for normal risk women     Sexually Transmitted Diseases  Chlamydia  Gonorrhea  Syphilis All at risk adults Annually for non pregnant females at increased risk         [place] Men Who How Ofter Need  Date of Last Service Action  Prostate Cancer - DRE & PSA Men over 50 Annually.  DRE might require a copay.     Sexually Transmitted Diseases  Syphilis All at risk adults Annually for men at increased risk

## 2019-11-05 DIAGNOSIS — R310 Gross hematuria: Secondary | ICD-10-CM | POA: Diagnosis not present

## 2019-11-15 ENCOUNTER — Telehealth: Payer: Self-pay

## 2019-11-15 NOTE — Telephone Encounter (Signed)
pls contact [facility] regarding order [phone]

## 2019-11-15 NOTE — Telephone Encounter (Signed)
Returned call to [facility], with [employer]. States they are missing Order # [id number]. This is not in [employer]. Deanna will refax this now for signature. Hubbard Hartshorn, BSN, RN-BC

## 2019-11-18 NOTE — Telephone Encounter (Signed)
This was signed and then faxed by [place] staff to [facility]. Hubbard Hartshorn, BSN, RN-BC

## 2019-11-19 ENCOUNTER — Other Ambulatory Visit: Payer: Self-pay

## 2019-11-19 ENCOUNTER — Encounter: Payer: Self-pay | Admitting: Internal Medicine

## 2019-11-19 ENCOUNTER — Ambulatory Visit (INDEPENDENT_AMBULATORY_CARE_PROVIDER_SITE_OTHER): Payer: Medicare HMO | Admitting: Internal Medicine

## 2019-11-19 DIAGNOSIS — I2694 Multiple subsegmental pulmonary emboli without acute cor pulmonale: Secondary | ICD-10-CM

## 2019-11-19 DIAGNOSIS — I87002 Postthrombotic syndrome without complications of left lower extremity: Secondary | ICD-10-CM

## 2019-11-19 NOTE — Progress Notes (Signed)
Internal Medicine Clinic Attending  Case discussed with Dr. Bloomfield  At the time of the visit.  We reviewed the resident's history and exam and pertinent patient test results.  I agree with the assessment, diagnosis, and plan of care documented in the resident's note.  

## 2019-11-19 NOTE — Progress Notes (Signed)
Acute Office Visit  Subjective:    Patient ID: Kimberly Walters, female    DOB: [birth year], 69 y.o.   MRN: [id number]  Chief Complaint  Patient presents with  . Follow-up    HPI Patient is in today for issue with Lovenox. Please see problem based charting for further details.   Past Medical History:  Diagnosis Date  . Hypertension     Past Surgical History:  Procedure Laterality Date  . HYSTERECTOMY ABDOMINAL WITH SALPINGO-OOPHORECTOMY  1975    Family History  Problem Relation Age of Onset  . Diabetes Mother   . Diabetes Father   . CAD Father   . Hypothyroidism Sister   . Melanoma Sister   . Ovarian cancer Other   . Uterine cancer Other   . Breast cancer Other   . Diabetes Son   . Uterine cancer Niece   . Pancreatic cancer Nephew     Social History   Socioeconomic History  . Marital status: Single    Spouse name: Not on file  . Number of children: Not on file  . Years of education: Not on file  . Highest education level: Not on file  Occupational History  . Not on file  Tobacco Use  . Smoking status: Former Smoker    Packs/day: 0.50    Types: Cigarettes    Quit date: 07/01/2019    Years since quitting: 0.3  . Smokeless tobacco: Never Used  . Tobacco comment: cutting back   Substance and Sexual Activity  . Alcohol use: No  . Drug use: No  . Sexual activity: Not on file  Other Topics Concern  . Not on file  Social History Narrative  . Not on file   Social Determinants of Health   Financial Resource Strain:   . Difficulty of Paying Living Expenses: Not on file  Food Insecurity:   . Worried About Charity fundraiser in the Last Year: Not on file  . Ran Out of Food in the Last Year: Not on file  Transportation Needs:   . Lack of Transportation (Medical): Not on file  . Lack of Transportation (Non-Medical): Not on file  Physical Activity:   . Days of Exercise per Week: Not on file  . Minutes of Exercise per Session: Not on file  Stress:   .  Feeling of Stress : Not on file  Social Connections:   . Frequency of Communication with Friends and Family: Not on file  . Frequency of Social Gatherings with Friends and Family: Not on file  . Attends Religious Services: Not on file  . Active Member of Clubs or Organizations: Not on file  . Attends Archivist Meetings: Not on file  . Marital Status: Not on file  Intimate Partner Violence:   . Fear of Current or Ex-Partner: Not on file  . Emotionally Abused: Not on file  . Physically Abused: Not on file  . Sexually Abused: Not on file    Outpatient Medications Prior to Visit  Medication Sig Dispense Refill  . enoxaparin (LOVENOX) 80 MG/0.8ML injection Inject 0.8 mLs (80 mg total) into the skin every 12 (twelve) hours. 144 mL 1  . lisinopril (ZESTRIL) 20 MG tablet Take 1 tablet (20 mg total) by mouth daily. 90 tablet 3  . pravastatin (PRAVACHOL) 10 MG tablet Take 1 tablet (10 mg total) by mouth daily. 90 tablet 3  . simethicone (GAS-X) 80 MG chewable tablet Chew 1 tablet (80 mg total) by mouth  4 (four) times daily as needed for flatulence. 100 tablet 2   No facility-administered medications prior to visit.    No Known Allergies  Review of Systems  Constitutional: Negative for chills, fatigue and fever.  Respiratory: Negative for cough and shortness of breath.   Cardiovascular: Positive for leg swelling. Negative for chest pain.  Gastrointestinal: Negative for blood in stool.  Genitourinary: Negative for hematuria.  Neurological: Negative for dizziness and headaches.       Objective:    Physical Exam Constitutional:      General: She is not in acute distress.    Appearance: Normal appearance.  Cardiovascular:     Rate and Rhythm: Normal rate and regular rhythm.  Pulmonary:     Effort: Pulmonary effort is normal.     Breath sounds: Normal breath sounds.  Abdominal:     Comments: Abdomen with scattered ecchymoses. Several subcutaneous nodules that are mildly  tender where she has been injecting Lovenox.   Musculoskeletal:     Comments: LLE with 1+  Pitting edema, prominent varicose veins. No skin changes or ulcerations.   Neurological:     Mental Status: She is alert.     BP 121/80 (BP Location: Left Arm, Patient Position: Sitting, Cuff Size: Normal)   Pulse 83   Temp 98 F (36.7 C) (Oral)   Ht 5\' 4"  (1.626 m)   Wt 169 lb 1.6 oz (76.7 kg)   SpO2 95%   BMI 29.03 kg/m  Wt Readings from Last 3 Encounters:  11/19/19 169 lb 1.6 oz (76.7 kg)  10/14/19 169 lb (76.7 kg)  09/06/19 163 lb 4.8 oz (74.1 kg)    Health Maintenance Due  Topic Date Due  . Hepatitis C Screening  Never done  . COVID-19 Vaccine (1) Never done  . TETANUS/TDAP  Never done  . COLON CANCER SCREENING ANNUAL FOBT  Never done  . INFLUENZA VACCINE  08/22/2019  . MAMMOGRAM  09/03/2019    There are no preventive care reminders to display for this patient.   Lab Results  Component Value Date   TSH 0.547 07/14/2019   Lab Results  Component Value Date   WBC 7.5 08/05/2019   HGB 9.3 (L) 08/05/2019   HCT 30.2 (L) 08/05/2019   MCV 94.4 08/05/2019   PLT 288 08/05/2019   Lab Results  Component Value Date   NA 141 08/05/2019   K 3.5 08/05/2019   CO2 29 08/05/2019   GLUCOSE 161 (H) 08/05/2019   BUN 13 08/05/2019   CREATININE 1.13 (H) 08/05/2019   BILITOT 0.3 07/31/2019   ALKPHOS 59 07/31/2019   AST 16 07/31/2019   ALT 29 07/31/2019   PROT 5.1 (L) 07/31/2019   ALBUMIN 2.1 (L) 07/31/2019   CALCIUM 8.4 (L) 08/05/2019   ANIONGAP 9 08/05/2019   Lab Results  Component Value Date   CHOL 168 11/16/2018   Lab Results  Component Value Date   HDL 58 11/16/2018   Lab Results  Component Value Date   LDLCALC 85 11/16/2018   Lab Results  Component Value Date   TRIG 146 11/16/2018   Lab Results  Component Value Date   CHOLHDL 2.9 11/16/2018   Lab Results  Component Value Date   HGBA1C 6.2 (H) 11/16/2018       Assessment & Plan:   Problem List Items  Addressed This Visit      Cardiovascular and Mediastinum   Post-thrombotic syndrome of left lower extremity    Patient continues to have asymmetrical  swelling of LLE where previous DVT was. Most recent ultrasound last month was negative for a new clot, and she has been compliant with twice daily Lovenox.  She was advised that elevation and compression were the standard treatment for this, but unfortunately she is not tolerating compression very well. If she continues to have function limiting swelling, will consider referral to vein specialist to discuss any treatment options in the future.       Pulmonary embolism [facility])    Patient presents with primary concern of no longer tolerating Lovenox injections. Despite rotating sites, she is constantly sore and has developed fairly large subcutaneous nodules. She plans to discuss this concern with Dr. Alen Blew. I will also send him a message regarding this concern to consider possible transition to warfarin.           No orders of the defined types were placed in this encounter.    Delice Bison, DO

## 2019-11-19 NOTE — Assessment & Plan Note (Signed)
Patient continues to have asymmetrical swelling of LLE where previous DVT was. Most recent ultrasound last month was negative for a new clot, and she has been compliant with twice daily Lovenox.  She was advised that elevation and compression were the standard treatment for this, but unfortunately she is not tolerating compression very well. If she continues to have function limiting swelling, will consider referral to vein specialist to discuss any treatment options in the future.

## 2019-11-19 NOTE — Assessment & Plan Note (Signed)
Patient presents with primary concern of no longer tolerating Lovenox injections. Despite rotating sites, she is constantly sore and has developed fairly large subcutaneous nodules. She plans to discuss this concern with Dr. Alen Blew. I will also send him a message regarding this concern to consider possible transition to warfarin.

## 2019-11-19 NOTE — Patient Instructions (Signed)
[name], It was nice seeing you.   I'm sorry the lovenox injections are causing you to have a lot of side effects. I think talking to Dr. Alen Blew about possible changing medications would be best. I'll also send him a message.   For the leg swelling, if you aren't tolerating a compression stocking, there have been studies showing horse chestnut could potentially help which is available over the counter, 300 mg twice daily if interested!  Take care, and I'll see you in 3 months!  Dr. Koleen Distance

## 2019-12-13 ENCOUNTER — Telehealth: Payer: Self-pay | Admitting: *Deleted

## 2019-12-13 ENCOUNTER — Other Ambulatory Visit: Payer: Self-pay | Admitting: Oncology

## 2019-12-13 MED ORDER — RIVAROXABAN 20 MG PO TABS: 20.0000 mg | ORAL_TABLET | Freq: Every day | ORAL | 3 refills | Status: DC

## 2019-12-13 NOTE — Telephone Encounter (Signed)
Patient called regarding her Lovenox medication.  She states that she has had a price increase from $95 / month and now $240 / mo.  She states she is going to have to charge the next month on a credit card because that out of pocket cost is too expensive.  She is also very concerned with the excessive bruising from the injections even in areas on her abdomen that have not been injected.    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  At that time she choose Lovenox.  Wants to know if Anticoagulant is still warranted and if so  Please advise if oral route can be ordered.    Last office note also mentions hypercoagulation lab studies to be done since sister has a known clotting disorder, and questions why those have not been done as of yet.  Routed to MD to advise.

## 2019-12-13 NOTE — Telephone Encounter (Signed)
I recommended stopping Lovenox. Rx for [facility] sent to her pharmacy to take instead.

## 2019-12-22 ENCOUNTER — Other Ambulatory Visit: Payer: Self-pay

## 2019-12-22 ENCOUNTER — Encounter: Payer: Self-pay | Admitting: Internal Medicine

## 2019-12-22 ENCOUNTER — Ambulatory Visit (INDEPENDENT_AMBULATORY_CARE_PROVIDER_SITE_OTHER): Payer: Medicare HMO | Admitting: Internal Medicine

## 2019-12-22 DIAGNOSIS — Z1211 Encounter for screening for malignant neoplasm of colon: Secondary | ICD-10-CM | POA: Diagnosis not present

## 2019-12-22 DIAGNOSIS — Z0000 Encounter for general adult medical examination without abnormal findings: Secondary | ICD-10-CM | POA: Diagnosis not present

## 2019-12-22 NOTE — Patient Instructions (Addendum)
Things That May Be Affecting Your Health:  Alcohol  Hearing loss  Pain    Depression  Home Safety  Sexual Health   Diabetes  Lack of physical activity  Stress   Difficulty with daily activities  Loneliness  Tiredness   Drug use x Medicines  Tobacco use   Falls  Motor Vehicle Safety  Weight   Food choices  Oral Health  Other    YOUR PERSONALIZED HEALTH PLAN : 1. Schedule your next subsequent Medicare Wellness visit in one year 2. Attend all of your regular appointments to address your medical issues 3. Complete the preventative screenings and services 4. Please contact the [facility] at [phone] to schedule a mammogram 5. You can discuss doing a FOBT stool test at your next OV on January, 28, 2022 6. Congratulations on your 20 lb weight loss goal! 7. Please begin seated and standing exercises with exercise band to increase strength and balance.  Annual Wellness Visit                       Medicare Covered Preventative Screenings and Services  Services & Screenings Men and Women Who How Often Need? Date of Last Service Action  Abdominal Aortic Aneurysm Adults with AAA risk factors Once     Alcohol Misuse and Counseling All Adults Screening once a year if no alcohol misuse. Counseling up to 4 face to face sessions.     Bone Density Measurement  Adults at risk for osteoporosis Once every 2 yrs     Lipid Panel Z13.6 All adults without CV disease Once every 5 yrs     Colorectal Cancer   Stool sample or  Colonoscopy All adults 59 and older   Once every year  Every 10 years x  FOBT at next visit  Depression All Adults Once a year  Today   Diabetes Screening Blood glucose, post glucose load, or GTT Z13.1  All adults at risk  Pre-diabetics  Once per year  Twice per year     Diabetes  Self-Management Training All adults Diabetics 10 hrs first year; 2 hours subsequent years. Requires Copay     Glaucoma  Diabetics  Family  history of glaucoma  African Americans 44 yrs +  Hispanic Americans 51 yrs + Annually - requires coppay     Hepatitis C Z72.89 or F19.20  High Risk for HCV  Born between 1945 and 1965  Annually  Once x  At next visit  HIV Z11.4 All adults based on risk  Annually btw ages 20 & 19 regardless of risk  Annually > 65 yrs if at increased risk     Lung Cancer Screening Asymptomatic adults aged 38-77 with 30 pack yr history and current smoker OR quit within the last 15 yrs Annually Must have counseling and shared decision making documentation before first screen     Medical Nutrition Therapy Adults with   Diabetes  Renal disease  Kidney transplant within past 3 yrs 3 hours first year; 2 hours subsequent years     Obesity and Counseling All adults Screening once a year Counseling if BMI 30 or higher  Today   Tobacco Use Counseling Adults who use tobacco  Up to 8 visits in one year     Vaccines Z23  Hepatitis B  Influenza   Pneumonia  Adults   Once  Once every flu season  Two different vaccines separated by one year x  Please let us know when you  have received your flu vaccine and Covid booster  Next Annual Wellness Visit People with Medicare Every year  Today     [place] Women Who How Often Need  Date of Last Service Action  Mammogram  Z12.31 Women over 72 One baseline ages 67-39. Annually ager 40 yrs+     Pap tests All women Annually if high risk. Every 2 yrs for normal risk women     Screening for cervical cancer with   Pap (Z01.419 nl or Z01.411abnl) &  HPV Z11.51 Women aged 25 to 50 Once every 5 yrs     Screening pelvic and breast exams All women Annually if high risk. Every 2 yrs for normal risk women     Sexually Transmitted Diseases  Chlamydia  Gonorrhea  Syphilis All at risk adults Annually for non pregnant females at increased risk         [place] Men Who How Ofter Need  Date  of Last Service Action  Prostate Cancer - DRE & PSA Men over 50 Annually.  DRE might require a copay.     Sexually Transmitted Diseases  Syphilis All at risk adults Annually for men at increased risk              Fall Prevention in the Home, Adult Falls can cause injuries. They can happen to people of all ages. There are many things you can do to make your home safe and to help prevent falls. Ask for help when making these changes, if needed. What actions can I take to prevent falls? General Instructions  Use good lighting in all rooms. Replace any light bulbs that burn out.  Turn on the lights when you go into a dark area. Use night-lights.  Keep items that you use often in easy-to-reach places. Lower the shelves around your home if necessary.  Set up your furniture so you have a clear path. Avoid moving your furniture around.  Do not have throw rugs and other things on the floor that can make you trip.  Avoid walking on wet floors.  If any of your floors are uneven, fix them.  Add color or contrast paint or tape to clearly mark and help you see: ? Any grab bars or handrails. ? First and last steps of stairways. ? Where the edge of each step is.  If you use a stepladder: ? Make sure that it is fully opened. Do not climb a closed stepladder. ? Make sure that both sides of the stepladder are locked into place. ? Ask someone to hold the stepladder for you while you use it.  If there are any pets around you, be aware of where they are. What can I do in the bathroom?      Keep the floor dry. Clean up any water that spills onto the floor as soon as it happens.  Remove soap buildup in the tub or shower regularly.  Use non-skid mats or decals on the floor of the tub or shower.  Attach bath mats securely with double-sided, non-slip rug tape.  If you need to sit down in the shower, use a plastic, non-slip stool.  Install grab bars by the toilet and in the tub  and shower. Do not use towel bars as grab bars. What can I do in the bedroom?  Make sure that you have a light by your bed that is easy to reach.  Do not use any sheets or blankets that are too  big for your bed. They should not hang down onto the floor.  Have a firm chair that has side arms. You can use this for support while you get dressed. What can I do in the kitchen?  Clean up any spills right away.  If you need to reach something above you, use a strong step stool that has a grab bar.  Keep electrical cords out of the way.  Do not use floor polish or wax that makes floors slippery. If you must use wax, use non-skid floor wax. What can I do with my stairs?  Do not leave any items on the stairs.  Make sure that you have a light switch at the top of the stairs and the bottom of the stairs. If you do not have them, ask someone to add them for you.  Make sure that there are handrails on both sides of the stairs, and use them. Fix handrails that are broken or loose. Make sure that handrails are as long as the stairways.  Install non-slip stair treads on all stairs in your home.  Avoid having throw rugs at the top or bottom of the stairs. If you do have throw rugs, attach them to the floor with carpet tape.  Choose a carpet that does not hide the edge of the steps on the stairway.  Check any carpeting to make sure that it is firmly attached to the stairs. Fix any carpet that is loose or worn. What can I do on the outside of my home?  Use bright outdoor lighting.  Regularly fix the edges of walkways and driveways and fix any cracks.  Remove anything that might make you trip as you walk through a door, such as a raised step or threshold.  Trim any bushes or trees on the path to your home.  Regularly check to see if handrails are loose or broken. Make sure that both sides of any steps have handrails.  Install guardrails along the edges of any raised decks and porches.  Clear  walking paths of anything that might make someone trip, such as tools or rocks.  Have any leaves, snow, or ice cleared regularly.  Use sand or salt on walking paths during winter.  Clean up any spills in your garage right away. This includes grease or oil spills. What other actions can I take?  Wear shoes that: ? Have a low heel. Do not wear high heels. ? Have rubber bottoms. ? Are comfortable and fit you well. ? Are closed at the toe. Do not wear open-toe sandals.  Use tools that help you move around (mobility aids) if they are needed. These include: ? Canes. ? Walkers. ? Scooters. ? Crutches.  Review your medicines with your doctor. Some medicines can make you feel dizzy. This can increase your chance of falling. Ask your doctor what other things you can do to help prevent falls. Where to find more information  Centers for Disease Control and Prevention, STEADI: https://garcia.biz/  [employer] on Aging: BrainJudge.co.uk Contact a doctor if:  You are afraid of falling at home.  You feel weak, drowsy, or dizzy at home.  You fall at home. Summary  There are many simple things that you can do to make your home safe and to help prevent falls.  Ways to make your home safe include removing tripping hazards and installing grab bars in the bathroom.  Ask for help when making these changes in your home. This information is not intended  to replace advice given to you by your health care provider. Make sure you discuss any questions you have with your health care provider. Document Revised: 04/30/2018 Document Reviewed: 08/22/2016 Elsevier Patient Education  2020 [place] Maintenance, Female Adopting a healthy lifestyle and getting preventive care are important in promoting health and wellness. Ask your health care provider about:  The right schedule for you to have regular tests and exams.  Things you can do on your own to prevent diseases and  keep yourself healthy. What should I know about diet, weight, and exercise? Eat a healthy diet   Eat a diet that includes plenty of vegetables, fruits, low-fat dairy products, and lean protein.  Do not eat a lot of foods that are high in solid fats, added sugars, or sodium. Maintain a healthy weight Body mass index (BMI) is used to identify weight problems. It estimates body fat based on height and weight. Your health care provider can help determine your BMI and help you achieve or maintain a healthy weight. Get regular exercise Get regular exercise. This is one of the most important things you can do for your health. Most adults should:  Exercise for at least 150 minutes each week. The exercise should increase your heart rate and make you sweat (moderate-intensity exercise).  Do strengthening exercises at least twice a week. This is in addition to the moderate-intensity exercise.  Spend less time sitting. Even light physical activity can be beneficial. Watch cholesterol and blood lipids Have your blood tested for lipids and cholesterol at 70 years of age, then have this test every 5 years. Have your cholesterol levels checked more often if:  Your lipid or cholesterol levels are high.  You are older than 70 years of age.  You are at high risk for heart disease. What should I know about cancer screening? Depending on your health history and family history, you may need to have cancer screening at various ages. This may include screening for:  Breast cancer.  Cervical cancer.  Colorectal cancer.  Skin cancer.  Lung cancer. What should I know about heart disease, diabetes, and high blood pressure? Blood pressure and heart disease  High blood pressure causes heart disease and increases the risk of stroke. This is more likely to develop in people who have high blood pressure readings, are of African descent, or are overweight.  Have your blood pressure checked: ? Every 3-5  years if you are 44-34 years of age. ? Every year if you are 61 years old or older. Diabetes Have regular diabetes screenings. This checks your fasting blood sugar level. Have the screening done:  Once every three years after age 71 if you are at a normal weight and have a low risk for diabetes.  More often and at a younger age if you are overweight or have a high risk for diabetes. What should I know about preventing infection? Hepatitis B If you have a higher risk for hepatitis B, you should be screened for this virus. Talk with your health care provider to find out if you are at risk for hepatitis B infection. Hepatitis C Testing is recommended for:  Everyone born from [birth year] through 1965.  Anyone with known risk factors for hepatitis C. Sexually transmitted infections (STIs)  Get screened for STIs, including gonorrhea and chlamydia, if: ? You are sexually active and are younger than 70 years of age. ? You are older than 70 years of age and your health care  provider tells you that you are at risk for this type of infection. ? Your sexual activity has changed since you were last screened, and you are at increased risk for chlamydia or gonorrhea. Ask your health care provider if you are at risk.  Ask your health care provider about whether you are at high risk for HIV. Your health care provider may recommend a prescription medicine to help prevent HIV infection. If you choose to take medicine to prevent HIV, you should first get tested for HIV. You should then be tested every 3 months for as long as you are taking the medicine. Pregnancy  If you are about to stop having your period (premenopausal) and you may become pregnant, seek counseling before you get pregnant.  Take 400 to 800 micrograms (mcg) of folic acid every day if you become pregnant.  Ask for birth control (contraception) if you want to prevent pregnancy. Osteoporosis and menopause Osteoporosis is a disease in which the  bones lose minerals and strength with aging. This can result in bone fractures. If you are 24 years old or older, or if you are at risk for osteoporosis and fractures, ask your health care provider if you should:  Be screened for bone loss.  Take a calcium or vitamin D supplement to lower your risk of fractures.  Be given hormone replacement therapy (HRT) to treat symptoms of menopause. Follow these instructions at home: Lifestyle  Do not use any products that contain nicotine or tobacco, such as cigarettes, e-cigarettes, and chewing tobacco. If you need help quitting, ask your health care provider.  Do not use street drugs.  Do not share needles.  Ask your health care provider for help if you need support or information about quitting drugs. Alcohol use  Do not drink alcohol if: ? Your health care provider tells you not to drink. ? You are pregnant, may be pregnant, or are planning to become pregnant.  If you drink alcohol: ? Limit how much you use to 0-1 drink a day. ? Limit intake if you are breastfeeding.  Be aware of how much alcohol is in your drink. In the [place] one drink equals one 12 oz bottle of beer (355 mL), one 5 oz glass of wine (148 mL), or one 1 oz glass of hard liquor (44 mL). General instructions  Schedule regular health, dental, and eye exams.  Stay current with your vaccines.  Tell your health care provider if: ? You often feel depressed. ? You have ever been abused or do not feel safe at home. Summary  Adopting a healthy lifestyle and getting preventive care are important in promoting health and wellness.  Follow your health care provider's instructions about healthy diet, exercising, and getting tested or screened for diseases.  Follow your health care provider's instructions on monitoring your cholesterol and blood pressure. This information is not intended to replace advice given to you by your health care provider. Make sure you discuss any  questions you have with your health care provider. Document Revised: 12/31/2017 Document Reviewed: 12/31/2017 Elsevier Patient Education  2020 [employer].

## 2019-12-22 NOTE — Progress Notes (Signed)
This AWV is being conducted by [place] only. The patient was located at home and I was located in [facility]. The patient's identity was confirmed using their DOB and current address. The patient or his/her legal guardian has consented to being evaluated through a telephone encounter and understands the associated risks (an examination cannot be done and the patient may need to come in for an appointment) / benefits (allows the patient to remain at home, decreasing exposure to coronavirus). I personally spent 43 minutes conducting the AWV.  Subjective:   Kimberly Walters is a 69 y.o. female who presents for a Medicare Annual Wellness Visit.  The following items have been reviewed and updated today in the appropriate area in the EMR.   Health Risk Assessment  Height, weight, BMI, and BP Visual acuity if needed Depression screen Fall risk / safety level Advance directive discussion Medical and family history were reviewed and updated Updating list of other providers & suppliers Medication reconciliation, including over the counter medicines Cognitive screen Written screening schedule Risk Factor list Personalized health advice, risky behaviors, and treatment advice  Social History   Social History Narrative   Current Social History 12/22/2019        Patient lives with 2 sons and 46 yo granddaughter in a one level duplex. There are 4 steps with handrail up to the entrance the patient uses.       Patient's method of transportation is personal car.      The highest level of education was some college.      The patient currently retired from [employer].      Identified important Relationships are "My 85 yo granddaughter (Kimberly Walters), 2 sons, 5 grands, and 3 greatgrands"      Pets : "None at this time but thinking about getting another Risk analyst / Fun:  All with Kimberly Walters: Shopping, going to movies, walking, out to eat, [employer], board games, computer games      Current  Stressors: 2 other granddaughters, one who just had baby, the other with health problems. Baby sister with stomach cancer.       Religious / Personal Beliefs: "Old fashioned [place]."       L. Penny Frisbie, BSN, RN-BC               Objective:    Vitals: LMP 06/[birth year]/1973  Vitals are unable to obtained due to [id number] public health emergency  Activities of Daily Living In your present state of health, do you have any difficulty performing the following activities: 12/22/2019 10/14/2019  Hearing? N N  Vision? N N  Difficulty concentrating or making decisions? N N  Walking or climbing stairs? N N  Comment - -  Dressing or bathing? N N  Doing errands, shopping? N N  Some recent data might be hidden    Goals Goals      Weight (lb) < 150 lb (68 kg) (pt-stated)       Fall Risk Fall Risk  12/22/2019 11/19/2019 10/14/2019 08/30/2019 08/09/2019  Falls in the past year? 0 0 0 0 0  Number falls in past yr: - - 0 - 0  Injury with Fall? - - 0 - 0  Risk for fall due to : No Fall Risks No Fall Risks - No Fall Risks Impaired balance/gait;Other (Comment)  Risk for fall due to: Comment - - - - short of breath at times  Follow up Education provided;Falls prevention discussed  Falls prevention discussed - Falls prevention discussed Falls evaluation completed   CDC Handout on Fall Prevention and Handout on Home Exercise Program, Access codes [email or web address] and [email or web address] mailed to patient with exercise band.   Depression Screen PHQ 2/9 Scores 12/22/2019 10/14/2019 08/30/2019 07/29/2019  PHQ - 2 Score 0 0 0 -  PHQ- 9 Score 4 3 3  -  Exception Documentation - - - Patient refusal    Patient became teary discussing her baby sister with multiple health concerns including recent dx of stomach cancer. Offered IBH to discuss but patient declined at this time.  Cognitive Testing Six-Item Cognitive Screener   "I would like to ask you some questions that ask you to use your memory. I am going to name three objects.  Please wait until I say all three words, then repeat them. Remember what they are  because I am going to ask you to name them again in a few minutes. Please repeat these words for me: APPLE--TABLE--PENNY. (Interviewer may repeat names 3 times if necessary but repetition not scored.)  Did patient correctly repeat all three words? Yes - may proceed with screen  What year is this? Correct What month is this? Correct What day of the week is this? Correct  What were the three objects I asked you to remember?  Apple Correct  Table Correct  Penny Correct  Score one point for each incorrect answer.  A score of 2 or more points warrants additional investigation.  Patient's score 0  Assessment and Plan:     Patient has received 2 Pfizer Covid vaccines and is scheduled to receive booster and flu vaccine next week. She [facility] to [facility] mamm[facility] She would like to do a FOBT at next OV in January She has made a 20 lb weight loss goal She will begin seated and standing exercises with exercise band to increase strength and balance.  During the course of the visit the patient was educated and counseled about appropriate screening and preventive services as documented in the assessment and plan.  The printed AVS was given to the patient and included an updated screening schedule, a list of risk factors, and personalized health advice.        [name], RN  12/22/2019

## 2020-01-01 NOTE — Progress Notes (Signed)
I discussed the AWV findings with the RN who conducted the visit. I was present in the office suite and immediately available to provide assistance and direction throughout the time the service was provided.  Molli Hazard A, DO 01/01/2020, 10:28 PM Pager: [phone]

## 2020-01-03 NOTE — Progress Notes (Signed)
[facility] Attending  Case discussed with Dr. Seawell at the time of the visit.  We reviewed the AWV findings.  I agree with the assessment, diagnosis, and plan of care documented in the AWV note.     

## 2020-01-25 ENCOUNTER — Inpatient Hospital Stay: Payer: Medicare HMO

## 2020-01-25 ENCOUNTER — Telehealth: Payer: Self-pay | Admitting: Oncology

## 2020-01-25 ENCOUNTER — Other Ambulatory Visit: Payer: Self-pay

## 2020-01-25 ENCOUNTER — Inpatient Hospital Stay: Payer: Medicare HMO | Attending: Oncology | Admitting: Oncology

## 2020-01-25 VITALS — BP 164/82 | HR 84 | Resp 20 | Wt 171.4 lb

## 2020-01-25 DIAGNOSIS — Z86718 Personal history of other venous thrombosis and embolism: Secondary | ICD-10-CM | POA: Insufficient documentation

## 2020-01-25 DIAGNOSIS — I2694 Multiple subsegmental pulmonary emboli without acute cor pulmonale: Secondary | ICD-10-CM | POA: Diagnosis not present

## 2020-01-25 DIAGNOSIS — I2699 Other pulmonary embolism without acute cor pulmonale: Secondary | ICD-10-CM | POA: Insufficient documentation

## 2020-01-25 DIAGNOSIS — Z7901 Long term (current) use of anticoagulants: Secondary | ICD-10-CM | POA: Insufficient documentation

## 2020-01-25 DIAGNOSIS — M545 Low back pain, unspecified: Secondary | ICD-10-CM | POA: Insufficient documentation

## 2020-01-25 LAB — ANTITHROMBIN III: AntiThromb III Func: 90 % (ref 75–120)

## 2020-01-25 NOTE — Progress Notes (Signed)
Hematology and Oncology Follow Up Visit  Kimberly Walters [id number] Nov 03, 1950 70 y.o. 01/25/2020 9:34 AM Kimberly Walters, Kimberly Walters, DOBloomfield, Kimberly D, DO   Principle Diagnosis: 70 year old woman with deep vein thrombosis and subsequently pulmonary embolism diagnosed in June 2021.  S.   Prior Therapy:  She was treated with Eliquis initially but developed PE in June 2021 while she was on Eliquis suggesting possible breakthrough thrombosis.  Lovenox 80 mg twice a day started in June 2021.  Therapy switched to Xarelto in November 2021.  Current therapy:  Xarelto 20 mg daily started in November 2021.   Interim History: [name] presents today for return evaluation.  Since the last visit, she reports no major complications related to Xarelto at this time.  She has been experiencing intermittent bilateral flank pain and lower back pain which has been persistent now for the last few days.  She denies any hematochezia, melena or hematuria.  She denies any fevers chills sweats.  She is still able to drive and attends activities of daily living.  She denies any neurological deficits.    Medications: Unchanged on review. Current Outpatient Medications  Medication Sig Dispense Refill  . acetaminophen (TYLENOL) 500 MG tablet Take 1,000 mg by mouth every 6 (six) hours as needed for mild pain.    [name] enoxaparin (LOVENOX) 80 MG/0.8ML injection Inject 0.8 mLs (80 mg total) into the skin every 12 (twelve) hours. 144 mL 1  . Homeopathic Products (THERAWORX GLOVE + FOAM EX) Apply topically.    [name] lisinopril (ZESTRIL) 20 MG tablet Take 1 tablet (20 mg total) by mouth daily. 90 tablet 3  . pravastatin (PRAVACHOL) 10 MG tablet Take 1 tablet (10 mg total) by mouth daily. 90 tablet 3  . rivaroxaban (XARELTO) 20 MG TABS tablet Take 1 tablet (20 mg total) by mouth daily with supper. 30 tablet 3  . simethicone (GAS-X) 80 MG chewable tablet Chew 1 tablet (80 mg total) by mouth 4 (four) times daily as needed for  flatulence. (Patient not taking: Reported on 12/22/2019) 100 tablet 2   No current facility-administered medications for this visit.     Allergies: No Known Allergies    Physical Exam: Blood pressure (!) 164/82, pulse 84, resp. rate 20, weight 171 lb 6.4 oz (77.7 kg), last menstrual period 06/21/1973, SpO2 95 %.    ECOG: 1    General appearance: Alert, awake without any distress. Head: Atraumatic without abnormalities Oropharynx: Without any thrush or ulcers. Eyes: No scleral icterus. Lymph nodes: No lymphadenopathy noted in the cervical, supraclavicular, or axillary nodes Heart:regular rate and rhythm, without any murmurs or gallops.   Lung: Clear to auscultation without any rhonchi, wheezes or dullness to percussion. Abdomin: Soft, nontender without any shifting dullness or ascites. Musculoskeletal: No clubbing or cyanosis. Neurological: No motor or sensory deficits. Skin: No rashes or lesions.     Lab Results: Lab Results  Component Value Date   WBC 7.5 08/05/2019   HGB 9.3 (L) 08/05/2019   HCT 30.2 (L) 08/05/2019   MCV 94.4 08/05/2019   PLT 288 08/05/2019     Chemistry      Component Value Date/Time   NA 141 08/05/2019 0930   NA 141 11/16/2018 1403   K 3.5 08/05/2019 0930   CL 103 08/05/2019 0930   CO2 29 08/05/2019 0930   BUN 13 08/05/2019 0930   BUN 17 11/16/2018 1403   CREATININE 1.13 (H) 08/05/2019 0930      Component Value Date/Time   CALCIUM 8.4 (  L) 08/05/2019 0930   ALKPHOS 59 07/31/2019 0639   AST 16 07/31/2019 0639   ALT 29 07/31/2019 0639   BILITOT 0.3 07/31/2019 0639   BILITOT 0.3 11/16/2018 1403       Impression and Plan:  70 year old woman with:  1.    Venous thromboembolism diagnosed in June 2021.  He had a DVT and subsequently pulmonary embolism that is potentially breakthrough on Eliquis.    She was treated with Lovenox for period of time and switched to Xarelto based on her preference.  Risks and benefits of continuing this  treatment at this time were reviewed.  Potential breakthrough thrombosis on Xarelto would be treated with Lovenox at this time.  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  Risks and benefits of obtaining this panel were discussed today and she is agreeable to have that done.  We will obtain this analysis today and communicate these results to her once they are available.   2.    Lower back pain: Unclear etiology and likely unrelated to venous thromboembolism.  I urged her to be evaluated by her primary care physician.   3.  Follow-up: In 6 months for repeat follow-up.   30  minutes were spent on this visit.  The time was dedicated to reviewing her disease status, discussing treatment options and future plan of care review.   Zola Button, MD 1/4/20229:34 AM

## 2020-01-25 NOTE — Telephone Encounter (Signed)
Scheduled appointment per 1/4 los. Spoke to patient who is aware of appointment date and time. Gave patient calendar print out.  

## 2020-01-26 LAB — PROTEIN S, TOTAL: Protein S Ag, Total: 102 % (ref 60–150)

## 2020-01-26 LAB — PROTEIN C ACTIVITY: Protein C Activity: 115 % (ref 73–180)

## 2020-01-26 LAB — PROTEIN C, TOTAL: Protein C, Total: 107 % (ref 60–150)

## 2020-01-26 LAB — HOMOCYSTEINE: Homocysteine: 11 umol/L (ref 0.0–17.2)

## 2020-01-26 LAB — PROTEIN S ACTIVITY: Protein S Activity: 100 % (ref 63–140)

## 2020-01-27 ENCOUNTER — Other Ambulatory Visit: Payer: Self-pay

## 2020-01-27 ENCOUNTER — Ambulatory Visit (INDEPENDENT_AMBULATORY_CARE_PROVIDER_SITE_OTHER): Payer: Medicare HMO | Admitting: Internal Medicine

## 2020-01-27 ENCOUNTER — Encounter: Payer: Self-pay | Admitting: Internal Medicine

## 2020-01-27 VITALS — BP 147/78 | HR 87 | Temp 98.2°F | Ht 64.0 in | Wt 172.9 lb

## 2020-01-27 DIAGNOSIS — N12 Tubulo-interstitial nephritis, not specified as acute or chronic: Secondary | ICD-10-CM

## 2020-01-27 DIAGNOSIS — R0782 Intercostal pain: Secondary | ICD-10-CM

## 2020-01-27 DIAGNOSIS — M546 Pain in thoracic spine: Secondary | ICD-10-CM | POA: Diagnosis not present

## 2020-01-27 LAB — POCT URINALYSIS DIPSTICK
Bilirubin, UA: NEGATIVE
Blood, UA: NEGATIVE
Glucose, UA: NEGATIVE
Ketones, UA: NEGATIVE
Leukocytes, UA: NEGATIVE
Nitrite, UA: NEGATIVE
Protein, UA: NEGATIVE
Spec Grav, UA: >=1.03 — AB (ref 1.010–1.025)
Urobilinogen, UA: 0.2 E.U./dL
pH, UA: 5.5 (ref 5.0–8.0)

## 2020-01-27 LAB — CARDIOLIPIN ANTIBODIES, IGG, IGM, IGA
Anticardiolipin IgA: <9 APL U/mL (ref 0–11)
Anticardiolipin IgG: <9 GPL U/mL (ref 0–14)
Anticardiolipin IgM: <9 MPL U/mL (ref 0–12)

## 2020-01-27 LAB — LUPUS ANTICOAGULANT PANEL
DRVVT: 73.3 s — ABNORMAL HIGH (ref 0.0–47.0)
PTT Lupus Anticoagulant: 43 s (ref 0.0–51.9)

## 2020-01-27 LAB — BETA-2-GLYCOPROTEIN I ABS, IGG/M/A
Beta-2 Glyco I IgG: <9 GPI IgG units (ref 0–20)
Beta-2-Glycoprotein I IgA: <9 GPI IgA units (ref 0–25)
Beta-2-Glycoprotein I IgM: <9 GPI IgM units (ref 0–32)

## 2020-01-27 LAB — DRVVT CONFIRM: dRVVT Confirm: 1.6 ratio — ABNORMAL HIGH (ref 0.8–1.2)

## 2020-01-27 LAB — DRVVT MIX: dRVVT Mix: 49.9 s — ABNORMAL HIGH (ref 0.0–40.4)

## 2020-01-27 NOTE — Patient Instructions (Signed)
Ms. Dupree, I'm sorry to hear about your back pain. I really want to make sure you're not having another blood clot to cause the pain. We are getting a CT scan of your chest similar to last time.   I will let you know once I have these results.   In the mean time, you can continue taking the Percocet as needed (as long as you're not driving). If the scan is negative, you would probably benefit from a muscle relaxer.   Hopefully you feel better soon!   Take care, Dr. [name]

## 2020-01-27 NOTE — Assessment & Plan Note (Addendum)
Patient presents with acute onset left sided thoracic back pain. Her symptoms were initially intermittent, but have now progressed to persistent. Pain wakes her from sleep. She describes the pain as sharp and spasmodic in nature. Severity similar to when she had a kidney stone and pulmonary infarct.  POC u/a negative for hematuria. She has not noticed any hematuria in the last week and denies other urinary symptoms, making renal stone less likely. No fevers, chills, n/v, chest pain, or syncope. She has a difficult time taking deep breaths due to the pain.  Percocet has been the only thing to give her any kind of relief. Symptoms are also worse with movement. Examination of back reveals no rash, no tenderness elicited with palpation.   A/P Ms. Sussman has a history of DVT/PE that failed DOAC and subsequently had to switch to lovenox injections. Due to cost and burden of daily injection, she discussed with hematologist and requested to try DOAC again. She has been on Xarelto since November. Currently undergoing hypercoagulable work-up per hematology.  Given this history, I am concerned for potential breakthrough PE despite not missing any doses of her Xarelto.  Will obtain stat CT PE study.  Muscle spasm next most likely but will be diagnosis of exclusion.

## 2020-01-27 NOTE — Progress Notes (Signed)
Acute Office Visit  Subjective:    Patient ID: Kimberly Walters, female    DOB: 1950-09-14, 70 y.o.   MRN: [id number]  Chief complaint: back pain   HPI Patient is in today for acute onset left sided thoracic back pain that began a week ago. Symptoms initially were intermittent but have progressed to persistent. She describes it as sharp and spasmodic in nature. The pain wakes her from sleep. She cannot take normal respirations due to the pain. She had some old percocet from her previous kidney stone in July which allows her to sleep for 3 hours at a time. She denies fevers, chills, chest pain, n/v, syncope, hematuria, dysuria, blood in her stool, or changes in bowel movements.   Past Medical History:  Diagnosis Date  . Anticoagulant-induced bleeding (HCC)   . Aortic atherosclerosis (HCC)   . Emphysema, unspecified (HCC)   . 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 Gross hematuria   . Hypertension   . Mitral valve prolapse   . Paroxysmal atrial fibrillation with RVR (HCC)   . Pleural effusion on left   . Post-thrombotic syndrome of left lower extremity   . Primary hypercoagulable state (HCC)   . Pulmonary emboli (HCC)   . Pyelonephritis     Past Surgical History:  Procedure Laterality Date  . HYSTERECTOMY ABDOMINAL WITH SALPINGO-OOPHORECTOMY  1975  . LYMPH NODE BIOPSY Right 1983   Neck    Family History  Problem Relation Age of Onset  . Diabetes Mother   . Heart disease Mother   . Cancer Mother        Uterine  . Diabetes Father   . CAD Father   . Heart attack Father   . Hypothyroidism Sister   . Melanoma Sister   . Clotting disorder Sister   . Cancer Sister        stomach  . Diabetes Sister   . Ovarian cancer Other   . Uterine cancer Other   . Breast cancer Other   . Diabetes Son   . Uterine cancer Niece   . Pancreatic cancer Nephew   . Diabetes Sister   . Hypertension Sister   . Hypothyroidism Sister   . Diabetes Sister   . Hypertension Sister   .  Hypothyroidism Sister     Social History   Socioeconomic History  . Marital status: Single    Spouse name: Not on file  . Number of children: 2  . Years of education: Some college  . Highest education level: Not on file  Occupational History  . Occupation: Retired    Associate Professor: [employer]  Tobacco Use  . Smoking status: Former Smoker    Packs/day: 0.01    Types: Cigarettes    Quit date: 07/01/2019    Years since quitting: 0.5  . Smokeless tobacco: Never Used  . Tobacco comment: 1-2 per day   Substance and Sexual Activity  . Alcohol use: No  . Drug use: No  . Sexual activity: Not on file  Other Topics Concern  . Not on file  Social History Narrative   Current Social History 12/22/2019        Patient lives with 2 sons and 46 yo granddaughter in a one level duplex. There are 4 steps with handrail up to the entrance the patient uses.       Patient's method of transportation is personal car.      The highest level of education was some college.  The patient currently retired from [employer].      Identified important Relationships are "My 35 yo granddaughter ([name]), 2 sons, 5 grands, and 3 greatgrands"      Pets : "None at this time but thinking about getting another Risk analyst / Fun:  All with [name]: Shopping, going to movies, walking, out to eat, [employer], board games, computer games      Current Stressors: 2 other granddaughters, one who just had baby, the other with health problems. Baby sister with stomach cancer.       Religious / Personal Beliefs: "Old fashioned [place]."       L. [place]atte, BSN, RN-BC          Social Determinants of Health   Financial Resource Strain: Not on file  Food Insecurity: Not on file  Transportation Needs: Not on file  Physical Activity: Not on file  Stress: Not on file  Social Connections: Not on file  Intimate Partner Violence: Not on file    Outpatient Medications Prior to Visit  Medication Sig Dispense  Refill  . acetaminophen (TYLENOL) 500 MG tablet Take 1,000 mg by mouth every 6 (six) hours as needed for mild pain.    [name] enoxaparin (LOVENOX) 80 MG/0.8ML injection Inject 0.8 mLs (80 mg total) into the skin every 12 (twelve) hours. 144 mL 1  . Homeopathic Products (THERAWORX GLOVE + FOAM EX) Apply topically.    [name] lisinopril (ZESTRIL) 20 MG tablet Take 1 tablet (20 mg total) by mouth daily. 90 tablet 3  . pravastatin (PRAVACHOL) 10 MG tablet Take 1 tablet (10 mg total) by mouth daily. 90 tablet 3  . rivaroxaban (XARELTO) 20 MG TABS tablet Take 1 tablet (20 mg total) by mouth daily with supper. 30 tablet 3  . simethicone (GAS-X) 80 MG chewable tablet Chew 1 tablet (80 mg total) by mouth 4 (four) times daily as needed for flatulence. (Patient not taking: Reported on 12/22/2019) 100 tablet 2   No facility-administered medications prior to visit.    No Known Allergies  Review of Systems  All other systems reviewed and are negative.      Objective:    Physical Exam Constitutional:      General: She is in acute distress.  Eyes:     Conjunctiva/sclera: Conjunctivae normal.  Cardiovascular:     Rate and Rhythm: Normal rate and regular rhythm.  Pulmonary:     Effort: Pulmonary effort is normal.     Breath sounds: Normal breath sounds.  Abdominal:     General: There is no distension.     Palpations: Abdomen is soft.     Tenderness: There is no abdominal tenderness.  Musculoskeletal:     Thoracic back: No spasms or tenderness.     Lumbar back: No spasms or tenderness.  Skin:    Findings: No lesion or rash.  Neurological:     Mental Status: She is alert.     BP (!) 147/78 (BP Location: Left Arm, Patient Position: Sitting, Cuff Size: Normal)   Pulse 87   Temp 98.2 F (36.8 C) (Oral)   Ht 5\' 4"  (1.626 m)   Wt 172 lb 14.4 oz (78.4 kg)   LMP 06/21/1973   SpO2 96% Comment: Room air  BMI 29.68 kg/m  Wt Readings from Last 3 Encounters:  01/27/20 172 lb 14.4 oz (78.4 kg)  01/25/20  171 lb 6.4 oz (77.7 kg)  11/19/19 169 lb 1.6 oz (76.7 kg)  Health Maintenance Due  Topic Date Due  . Hepatitis C Screening  Never done  . TETANUS/TDAP  Never done  . COLON CANCER SCREENING ANNUAL FOBT  Never done  . INFLUENZA VACCINE  08/22/2019  . MAMMOGRAM  09/03/2019  . COVID-19 Vaccine (3 - Booster for Pfizer series) 09/17/2019    There are no preventive care reminders to display for this patient.   Lab Results  Component Value Date   TSH 0.547 07/14/2019   Lab Results  Component Value Date   WBC 7.5 08/05/2019   HGB 9.3 (L) 08/05/2019   HCT 30.2 (L) 08/05/2019   MCV 94.4 08/05/2019   PLT 288 08/05/2019   Lab Results  Component Value Date   NA 141 08/05/2019   K 3.5 08/05/2019   CO2 29 08/05/2019   GLUCOSE 161 (H) 08/05/2019   BUN 13 08/05/2019   CREATININE 1.13 (H) 08/05/2019   BILITOT 0.3 07/31/2019   ALKPHOS 59 07/31/2019   AST 16 07/31/2019   ALT 29 07/31/2019   PROT 5.1 (L) 07/31/2019   ALBUMIN 2.1 (L) 07/31/2019   CALCIUM 8.4 (L) 08/05/2019   ANIONGAP 9 08/05/2019   Lab Results  Component Value Date   CHOL 168 11/16/2018   Lab Results  Component Value Date   HDL 58 11/16/2018   Lab Results  Component Value Date   LDLCALC 85 11/16/2018   Lab Results  Component Value Date   TRIG 146 11/16/2018   Lab Results  Component Value Date   CHOLHDL 2.9 11/16/2018   Lab Results  Component Value Date   HGBA1C 6.2 (H) 11/16/2018       Assessment & Plan:   Problem List Items Addressed This Visit      Genitourinary   Pyelonephritis   Relevant Orders   POCT Urinalysis Dipstick (81002) (Completed)     Other   Acute left-sided thoracic back pain - Primary    Patient presents with acute onset left sided thoracic back pain. Her symptoms were initially intermittent, but have now progressed to persistent. Pain wakes her from sleep. She describes the pain as sharp and spasmodic in nature. Severity similar to when she had a kidney stone and  pulmonary infarct.  POC u/a negative for hematuria. She has not noticed any hematuria in the last week and denies other urinary symptoms, making renal stone less likely. No fevers, chills, n/v, chest pain, or syncope. She has a difficult time taking deep breaths due to the pain.  Percocet has been the only thing to give her any kind of relief. Symptoms are also worse with movement. Examination of back reveals no rash, no tenderness elicited with palpation.   A/P Ms. Kaminski has a history of DVT/PE that failed DOAC and subsequently had to switch to lovenox injections. Due to cost and burden of daily injection, she discussed with hematologist and requested to try [place] again. She has been on Xarelto since November. Currently undergoing hypercoagulable work-up per hematology.  Given this history, I am concerned for potential breakthrough PE despite not missing any doses of her Xarelto.  Will obtain stat CT PE study.  Muscle spasm next most likely but will be diagnosis of exclusion.       Relevant Orders   CT Angio Chest W/Cm &/Or Wo Cm    Other Visit Diagnoses    Intercostal pain           No orders of the defined types were placed in this encounter.    Ladislao Cohenour  D Jermayne Sweeney, DO

## 2020-01-28 ENCOUNTER — Ambulatory Visit
Admission: RE | Admit: 2020-01-28 | Discharge: 2020-01-28 | Disposition: A | Discharge status: Home / Self Care | Payer: Medicare HMO | Source: Ambulatory Visit | Attending: Internal Medicine | Admitting: Internal Medicine

## 2020-01-28 ENCOUNTER — Telehealth: Payer: Self-pay

## 2020-01-28 ENCOUNTER — Other Ambulatory Visit: Payer: Medicare HMO

## 2020-01-28 DIAGNOSIS — I2699 Other pulmonary embolism without acute cor pulmonale: Secondary | ICD-10-CM | POA: Diagnosis not present

## 2020-01-28 DIAGNOSIS — M546 Pain in thoracic spine: Secondary | ICD-10-CM

## 2020-01-28 LAB — FACTOR 5 LEIDEN

## 2020-01-28 LAB — PROTHROMBIN GENE MUTATION

## 2020-01-28 MED ORDER — IOPAMIDOL (ISOVUE-370) INJECTION 76%
75.0000 mL | Freq: Once | INTRAVENOUS | Status: AC | PRN
  Administered 2020-01-28: 75 mL via INTRAVENOUS

## 2020-01-28 NOTE — Telephone Encounter (Signed)
Received Stat report TC from [place] at [facility] from patient's CT Scan.  Wants MD to read impression and the Addendum on the report ASAP. Will forward to Dr. Koleen Distance she was also notified by [facility]. SChaplin, RN,BSN

## 2020-01-31 ENCOUNTER — Telehealth: Payer: Self-pay

## 2020-01-31 MED ORDER — OXYCODONE HCL 5 MG PO TABS: 5.0000 mg | ORAL_TABLET | Freq: Three times a day (TID) | ORAL | 0 refills | Status: AC | PRN

## 2020-01-31 MED ORDER — CYCLOBENZAPRINE HCL 10 MG PO TABS: 10.0000 mg | ORAL_TABLET | Freq: Three times a day (TID) | ORAL | 0 refills | Status: DC | PRN

## 2020-01-31 NOTE — Telephone Encounter (Signed)
Called patient regarding results. We discussed that her acute pain is likely from T10 fracture. I will send her in some medications to help treat her pain.   I am also looking into what needs to be done for her chronic PE findings.   She understands the plan and will call if she needs anything else.

## 2020-01-31 NOTE — Telephone Encounter (Signed)
Returned call to patient. She is requesting results of CT done 01/28/2020. States she is in constant pain. Please call.

## 2020-01-31 NOTE — Telephone Encounter (Signed)
Pt is calling back [phone]

## 2020-01-31 NOTE — Telephone Encounter (Signed)
Pt states she is having back and sides pain. Requesting to speak with a nurse about what should she do. Pleases call pt back.

## 2020-02-02 NOTE — Progress Notes (Signed)
Internal Medicine Clinic Attending  Case discussed with Dr. Bloomfield  At the time of the visit.  We reviewed the resident's history and exam and pertinent patient test results.  I agree with the assessment, diagnosis, and plan of care documented in the resident's note.  

## 2020-02-16 ENCOUNTER — Other Ambulatory Visit: Payer: Self-pay

## 2020-02-16 ENCOUNTER — Encounter: Payer: Self-pay | Admitting: Internal Medicine

## 2020-02-16 ENCOUNTER — Ambulatory Visit (INDEPENDENT_AMBULATORY_CARE_PROVIDER_SITE_OTHER): Payer: Medicare HMO | Admitting: Internal Medicine

## 2020-02-16 VITALS — BP 137/82 | HR 88 | Temp 98.5°F | Ht 64.0 in | Wt 165.7 lb

## 2020-02-16 DIAGNOSIS — I8393 Asymptomatic varicose veins of bilateral lower extremities: Secondary | ICD-10-CM | POA: Diagnosis not present

## 2020-02-16 DIAGNOSIS — M8080XA Other osteoporosis with current pathological fracture, unspecified site, initial encounter for fracture: Secondary | ICD-10-CM | POA: Diagnosis not present

## 2020-02-16 DIAGNOSIS — M810 Age-related osteoporosis without current pathological fracture: Secondary | ICD-10-CM | POA: Diagnosis not present

## 2020-02-16 MED ORDER — ALENDRONATE SODIUM 70 MG PO TABS: 70.0000 mg | ORAL_TABLET | ORAL | 11 refills | Status: AC

## 2020-02-16 MED ORDER — CYCLOBENZAPRINE HCL 10 MG PO TABS: 10.0000 mg | ORAL_TABLET | Freq: Three times a day (TID) | ORAL | 0 refills | Status: DC | PRN

## 2020-02-16 NOTE — Progress Notes (Signed)
Acute Office Visit  Subjective:    Patient ID: Kimberly Walters, female    DOB: February 18, 1950, 70 y.o.   MRN: [id number]  Chief complaint: follow-up on back pain   HPI Patient is in today for follow-up on acute back pain, requesting referral to vein specialist. Please see problem based charting for further details.   Past Medical History:  Diagnosis Date  . Anticoagulant-induced bleeding ([place])   . Aortic atherosclerosis ([place])   . Emphysema, unspecified ([place])   . 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 Gross hematuria   . Hypertension   . Mitral valve prolapse   . Paroxysmal atrial fibrillation with RVR ([place])   . Pleural effusion on left   . Post-thrombotic syndrome of left lower extremity   . Primary hypercoagulable state ([place])   . Pulmonary emboli ([place])   . Pyelonephritis     Past Surgical History:  Procedure Laterality Date  . HYSTERECTOMY ABDOMINAL WITH SALPINGO-OOPHORECTOMY  1975  . LYMPH NODE BIOPSY Right 1983   Neck    Family History  Problem Relation Age of Onset  . Diabetes Mother   . Heart disease Mother   . Cancer Mother        Uterine  . Diabetes Father   . CAD Father   . Heart attack Father   . Hypothyroidism Sister   . Melanoma Sister   . Clotting disorder Sister   . Cancer Sister        stomach  . Diabetes Sister   . Ovarian cancer Other   . Uterine cancer Other   . Breast cancer Other   . Diabetes Son   . Uterine cancer Niece   . Pancreatic cancer Nephew   . Diabetes Sister   . Hypertension Sister   . Hypothyroidism Sister   . Diabetes Sister   . Hypertension Sister   . Hypothyroidism Sister     Social History   Socioeconomic History  . Marital status: Single    Spouse name: Not on file  . Number of children: 2  . Years of education: Some college  . Highest education level: Not on file  Occupational History  . Occupation: Retired    Fish farm manager: [employer]  Tobacco Use  . Smoking status: Former Smoker    Packs/day: 0.01    Types:  Cigarettes    Quit date: 07/01/2019    Years since quitting: 0.6  . Smokeless tobacco: Never Used  . Tobacco comment: 1-2 per day   Substance and Sexual Activity  . Alcohol use: No  . Drug use: No  . Sexual activity: Not on file  Other Topics Concern  . Not on file  Social History Narrative   Current Social History 12/22/2019        Patient lives with 2 sons and 95 yo granddaughter in a one level duplex. There are 4 steps with handrail up to the entrance the patient uses.       Patient's method of transportation is personal car.      The highest level of education was some college.      The patient currently retired from [employer].      Identified important Relationships are "My 33 yo granddaughter ([name]), 2 sons, 5 grands, and 3 greatgrands"      Pets : "None at this time but thinking about getting another Risk analyst / Fun:  All with [name]: Shopping, going to movies, walking, out to eat, [employer],  board games, computer games      Current Stressors: 2 other granddaughters, one who just had baby, the other with health problems. Baby sister with stomach cancer.       Religious / Personal Beliefs: "Old fashioned [place]."       L. [place]atte, BSN, RN-BC          Social Determinants of Health   Financial Resource Strain: Not on file  Food Insecurity: Not on file  Transportation Needs: Not on file  Physical Activity: Not on file  Stress: Not on file  Social Connections: Not on file  Intimate Partner Violence: Not on file    Outpatient Medications Prior to Visit  Medication Sig Dispense Refill  . acetaminophen (TYLENOL) 500 MG tablet Take 1,000 mg by mouth every 6 (six) hours as needed for mild pain.    [name] enoxaparin (LOVENOX) 80 MG/0.8ML injection Inject 0.8 mLs (80 mg total) into the skin every 12 (twelve) hours. 144 mL 1  . Homeopathic Products (THERAWORX GLOVE + FOAM EX) Apply topically.    [name] lisinopril (ZESTRIL) 20 MG tablet Take 1 tablet (20 mg  total) by mouth daily. 90 tablet 3  . pravastatin (PRAVACHOL) 10 MG tablet Take 1 tablet (10 mg total) by mouth daily. 90 tablet 3  . rivaroxaban (XARELTO) 20 MG TABS tablet Take 1 tablet (20 mg total) by mouth daily with supper. 30 tablet 3  . simethicone (GAS-X) 80 MG chewable tablet Chew 1 tablet (80 mg total) by mouth 4 (four) times daily as needed for flatulence. (Patient not taking: Reported on 12/22/2019) 100 tablet 2  . cyclobenzaprine (FLEXERIL) 10 MG tablet Take 1 tablet (10 mg total) by mouth 3 (three) times daily as needed for muscle spasms. 30 tablet 0   No facility-administered medications prior to visit.    No Known Allergies  Review of Systems  Constitutional: Negative for activity change, chills, fatigue, fever and unexpected weight change.  HENT: Negative for hearing loss, sinus pain, sore throat and trouble swallowing.   Eyes: Negative for visual disturbance.  Respiratory: Negative for cough and shortness of breath.   Cardiovascular: Negative for chest pain and palpitations.  Gastrointestinal: Negative for abdominal pain, blood in stool, constipation and diarrhea.  Genitourinary: Negative for dysuria and hematuria.  Musculoskeletal: Positive for back pain. Negative for joint swelling and neck pain.  Skin: Negative for rash.  Neurological: Negative for weakness, light-headedness, numbness and headaches.  Psychiatric/Behavioral: Negative for sleep disturbance.       Objective:    Physical Exam Constitutional:      General: She is not in acute distress.    Appearance: Normal appearance.  Eyes:     Conjunctiva/sclera: Conjunctivae normal.  Cardiovascular:     Rate and Rhythm: Normal rate and regular rhythm.     Pulses: Normal pulses.  Pulmonary:     Effort: Pulmonary effort is normal.     Breath sounds: Normal breath sounds.  Abdominal:     General: There is no distension.     Palpations: Abdomen is soft.     Tenderness: There is no abdominal tenderness.   Musculoskeletal:     Comments: TTP of thoracic paraspinal muscles   Skin:    Comments: Varicose veins diffusely throughout bilateral lower extremities.   Neurological:     General: No focal deficit present.     Mental Status: She is alert.  Psychiatric:        Mood and Affect: Mood normal.  Behavior: Behavior normal.     BP 137/82 (BP Location: Left Arm, Patient Position: Sitting, Cuff Size: Normal)   Pulse 88   Temp 98.5 F (36.9 C) (Oral)   Ht 5\' 4"  (1.626 m)   Wt 165 lb 11.2 oz (75.2 kg)   LMP 06/21/1973   SpO2 96% Comment: room air  BMI 28.44 kg/m  Wt Readings from Last 3 Encounters:  02/16/20 165 lb 11.2 oz (75.2 kg)  01/27/20 172 lb 14.4 oz (78.4 kg)  01/25/20 171 lb 6.4 oz (77.7 kg)    Health Maintenance Due  Topic Date Due  . Hepatitis C Screening  Never done  . TETANUS/TDAP  Never done  . COLON CANCER SCREENING ANNUAL FOBT  Never done  . INFLUENZA VACCINE  08/22/2019  . MAMMOGRAM  09/03/2019    There are no preventive care reminders to display for this patient.   Lab Results  Component Value Date   TSH 0.547 07/14/2019   Lab Results  Component Value Date   WBC 7.5 08/05/2019   HGB 9.3 (L) 08/05/2019   HCT 30.2 (L) 08/05/2019   MCV 94.4 08/05/2019   PLT 288 08/05/2019   Lab Results  Component Value Date   NA 141 08/05/2019   K 3.5 08/05/2019   CO2 29 08/05/2019   GLUCOSE 161 (H) 08/05/2019   BUN 13 08/05/2019   CREATININE 1.13 (H) 08/05/2019   BILITOT 0.3 07/31/2019   ALKPHOS 59 07/31/2019   AST 16 07/31/2019   ALT 29 07/31/2019   PROT 5.1 (L) 07/31/2019   ALBUMIN 2.1 (L) 07/31/2019   CALCIUM 8.4 (L) 08/05/2019   ANIONGAP 9 08/05/2019   Lab Results  Component Value Date   CHOL 168 11/16/2018   Lab Results  Component Value Date   HDL 58 11/16/2018   Lab Results  Component Value Date   LDLCALC 85 11/16/2018   Lab Results  Component Value Date   TRIG 146 11/16/2018   Lab Results  Component Value Date   CHOLHDL 2.9  11/16/2018   Lab Results  Component Value Date   HGBA1C 6.2 (H) 11/16/2018       Assessment & Plan:   Problem List Items Addressed This Visit      Cardiovascular and Mediastinum   Asymptomatic varicose veins of both lower extremities    Ms. Giel has had varicose veins of both lower extremities for quite some time. She thinks around her feet and ankles have started to get more recently. She would like to speak with a vein specialist to discuss possible treatment options.  Referral placed today.       Relevant Orders   Ambulatory referral to Vascular Surgery     Musculoskeletal and Integument   Localized osteoporosis with current pathological fracture - Primary    Ms. Quilter presented a couple of weeks ago with acute onset, severe back pain. Given her recent history of PE with pulmonary infarct, I obtained a CT scan to rule out a new clot or worsening of her previous PE. There were no new or worsening clots. However, she did have a compression fracture at T10. This was without any fall or known injury, making this a fragility fracture and, by definition, osteoporosis.  Will start once weekly alendronate.  Vitamin D level was checked which is also low at 16. We will start her on 1000 units daily of Vitamin D and re-check her level in about 3 months to ensure it is responding appropriately to replacement.  Relevant Medications   alendronate (FOSAMAX) 70 MG tablet   cholecalciferol (VITAMIN D) 25 MCG (1000 UNIT) tablet   Other Relevant Orders   Vitamin D (25 hydroxy) (Completed)       Meds ordered this encounter  Medications  . alendronate (FOSAMAX) 70 MG tablet    Sig: Take 1 tablet (70 mg total) by mouth every 7 (seven) days. Take with a full glass of water on an empty stomach.    Dispense:  4 tablet    Refill:  11  . cyclobenzaprine (FLEXERIL) 10 MG tablet    Sig: Take 1 tablet (10 mg total) by mouth 3 (three) times daily as needed for muscle spasms.    Dispense:  30  tablet    Refill:  0  . cholecalciferol (VITAMIN D) 25 MCG (1000 UNIT) tablet    Sig: Take 1 tablet (1,000 Units total) by mouth daily.    Dispense:  30 tablet    Refill:  2     Myrtle Barnhard D Laurinda Carreno, DO

## 2020-02-16 NOTE — Patient Instructions (Addendum)
Kimberly Walters, It was nice seeing you! So glad the back is slowly getting better. I will refill the muscle relaxer to continue taking as needed.   With this fracture and based on your previous bone density testing, you have something called osteoporosis (weakened bones). I am starting you on a medication that you will take once weekly to help. I'm also checking your Vitamin D to make sure we don't need to supplement that. Be sure to eat plenty of calcium in your diet (milk, cheese, yogurt).   I have placed a referral to a vein specialist like we discussed.   Take care, Dr. Koleen Distance

## 2020-02-17 LAB — VITAMIN D 25 HYDROXY (VIT D DEFICIENCY, FRACTURES): Vit D, 25-Hydroxy: 16 ng/mL — ABNORMAL LOW (ref 30.0–100.0)

## 2020-02-17 MED ORDER — VITAMIN D3 25 MCG (1000 UNIT) PO TABS: 1000.0000 [IU] | ORAL_TABLET | Freq: Every day | ORAL | 2 refills | Status: DC

## 2020-02-18 ENCOUNTER — Encounter: Payer: Medicare HMO | Admitting: Internal Medicine

## 2020-02-18 ENCOUNTER — Encounter: Payer: Self-pay | Admitting: Internal Medicine

## 2020-02-18 DIAGNOSIS — I8393 Asymptomatic varicose veins of bilateral lower extremities: Secondary | ICD-10-CM | POA: Insufficient documentation

## 2020-02-18 DIAGNOSIS — M8080XA Other osteoporosis with current pathological fracture, unspecified site, initial encounter for fracture: Secondary | ICD-10-CM | POA: Insufficient documentation

## 2020-02-18 NOTE — Assessment & Plan Note (Signed)
Kimberly Walters presented a couple of weeks ago with acute onset, severe back pain. Given her recent history of PE with pulmonary infarct, I obtained a CT scan to rule out a new clot or worsening of her previous PE. There were no new or worsening clots. However, she did have a compression fracture at T10. This was without any fall or known injury, making this a fragility fracture and, by definition, osteoporosis.  Will start once weekly alendronate.  Vitamin D level was checked which is also low at 16. We will start her on 1000 units daily of Vitamin D and re-check her level in about 3 months to ensure it is responding appropriately to replacement.

## 2020-02-18 NOTE — Assessment & Plan Note (Signed)
Kimberly Walters has had varicose veins of both lower extremities for quite some time. She thinks around her feet and ankles have started to get more recently. She would like to speak with a vein specialist to discuss possible treatment options.  Referral placed today.

## 2020-02-21 NOTE — Progress Notes (Signed)
Internal Medicine Clinic Attending  Case discussed with Dr. Bloomfield  At the time of the visit.  We reviewed the resident's history and exam and pertinent patient test results.  I agree with the assessment, diagnosis, and plan of care documented in the resident's note.  

## 2020-03-21 ENCOUNTER — Other Ambulatory Visit: Payer: Self-pay

## 2020-03-21 DIAGNOSIS — I8393 Asymptomatic varicose veins of bilateral lower extremities: Secondary | ICD-10-CM

## 2020-03-29 ENCOUNTER — Other Ambulatory Visit: Payer: Self-pay

## 2020-03-29 ENCOUNTER — Encounter: Payer: Self-pay | Admitting: Physician Assistant

## 2020-03-29 ENCOUNTER — Ambulatory Visit: Payer: Medicare HMO | Admitting: Physician Assistant

## 2020-03-29 ENCOUNTER — Ambulatory Visit (HOSPITAL_COMMUNITY)
Admission: RE | Admit: 2020-03-29 | Discharge: 2020-03-29 | Disposition: A | Discharge status: Home / Self Care | Payer: Medicare HMO | Source: Ambulatory Visit | Attending: Vascular Surgery | Admitting: Vascular Surgery

## 2020-03-29 VITALS — BP 161/93 | HR 86 | Temp 97.8°F | Resp 20 | Ht 64.0 in | Wt 162.4 lb

## 2020-03-29 DIAGNOSIS — I8393 Asymptomatic varicose veins of bilateral lower extremities: Secondary | ICD-10-CM | POA: Insufficient documentation

## 2020-03-29 NOTE — Progress Notes (Signed)
[facility] [place]   Reason for referral:  left LE mild swelling   History of Present Illness  Kimberly Walters is a 70 y.o. female who presents with chief complaint: swollen leg.  Patient notes, onset of swelling 07/20/19 associated with left LE DVT and B PE.Kimberly Walters  She has a history of Afib. Failed initial treatment with Eliquis given worsening of PE despite full compliance with DOAC.  She was then on Lovenox and referred to Hematology for work up.   She now takes Xarelto daily.  The patient has had positive history of DVT, no history of varicose vein she does have raticular vaiens on B LE, no history of venous stasis ulcers, no history of  Lymphedema and no history of skin changes in lower legs.   The patient has  used compression stockings in the past, but does not tolerate it.  She states she is very busy and occasionally elevates her legs.    She states the left leg still swells some, but has improved significantly since last June.  She denise non healing wounds and skin weeping.    Past Medical History:  Diagnosis Date  . Anticoagulant-induced bleeding ([place])   . Aortic atherosclerosis ([place])   . Emphysema, unspecified ([place])   . 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 Gross hematuria   . Hypertension   . Mitral valve prolapse   . Paroxysmal atrial fibrillation with RVR ([place])   . Pleural effusion on left   . Post-thrombotic syndrome of left lower extremity   . Primary hypercoagulable state ([place])   . Pulmonary emboli ([place])   . Pyelonephritis     Past Surgical History:  Procedure Laterality Date  . HYSTERECTOMY ABDOMINAL WITH SALPINGO-OOPHORECTOMY  1975  . LYMPH NODE BIOPSY Right 1983   Neck    Social History   Socioeconomic History  . Marital status: Single    Spouse name: Not on file  . Number of children: 2  . Years of education: Some college  . Highest education level: Not on file  Occupational History  . Occupation: Retired    Fish farm manager: [employer]  Tobacco Use   . Smoking status: Former Smoker    Packs/day: 0.01    Types: Cigarettes    Quit date: 07/01/2019    Years since quitting: 0.7  . Smokeless tobacco: Never Used  . Tobacco comment: 1-2 per day   Substance and Sexual Activity  . Alcohol use: No  . Drug use: No  . Sexual activity: Not on file  Other Topics Concern  . Not on file  Social History Narrative   Current Social History 12/22/2019        Patient lives with 2 sons and 90 yo granddaughter in a one level duplex. There are 4 steps with handrail up to the entrance the patient uses.       Patient's method of transportation is personal car.      The highest level of education was some college.      The patient currently retired from [employer].      Identified important Relationships are "My 33 yo granddaughter ([name]), 2 sons, 5 grands, and 3 greatgrands"      Pets : "None at this time but thinking about getting another Risk analyst / Fun:  All with [name]: Shopping, going to movies, walking, out to eat, [employer], board games, computer games      Current Stressors: 2 other granddaughters,  one who just had baby, the other with health problems. Baby sister with stomach cancer.       Religious / Personal Beliefs: "Old fashioned [place]."       L. [place], BSN, RN-BC          Social Determinants of Health   Financial Resource Strain: Not on file  Food Insecurity: Not on file  Transportation Needs: Not on file  Physical Activity: Not on file  Stress: Not on file  Social Connections: Not on file  Intimate Partner Violence: Not on file    Family History  Problem Relation Age of Onset  . Diabetes Mother   . Heart disease Mother   . Cancer Mother        Uterine  . Diabetes Father   . CAD Father   . Heart attack Father   . Hypothyroidism Sister   . Melanoma Sister   . Clotting disorder Sister   . Cancer Sister        stomach  . Diabetes Sister   . Ovarian cancer Other   . Uterine cancer Other    . Breast cancer Other   . Diabetes Son   . Uterine cancer Niece   . Pancreatic cancer Nephew   . Diabetes Sister   . Hypertension Sister   . Hypothyroidism Sister   . Diabetes Sister   . Hypertension Sister   . Hypothyroidism Sister     Current Outpatient Medications on File Prior to Visit  Medication Sig Dispense Refill  . acetaminophen (TYLENOL) 500 MG tablet Take 1,000 mg by mouth every 6 (six) hours as needed for mild pain.    Kimberly Walters alendronate (FOSAMAX) 70 MG tablet Take 1 tablet (70 mg total) by mouth every 7 (seven) days. Take with a full glass of water on an empty stomach. 4 tablet 11  . cholecalciferol (VITAMIN D) 25 MCG (1000 UNIT) tablet Take 1 tablet (1,000 Units total) by mouth daily. 30 tablet 2  . cyclobenzaprine (FLEXERIL) 10 MG tablet Take 1 tablet (10 mg total) by mouth 3 (three) times daily as needed for muscle spasms. 30 tablet 0  . enoxaparin (LOVENOX) 80 MG/0.8ML injection Inject 0.8 mLs (80 mg total) into the skin every 12 (twelve) hours. 144 mL 1  . Homeopathic Products (THERAWORX GLOVE + FOAM EX) Apply topically.    Kimberly Walters lisinopril (ZESTRIL) 20 MG tablet Take 1 tablet (20 mg total) by mouth daily. 90 tablet 3  . pravastatin (PRAVACHOL) 10 MG tablet Take 1 tablet (10 mg total) by mouth daily. 90 tablet 3  . rivaroxaban (XARELTO) 20 MG TABS tablet Take 1 tablet (20 mg total) by mouth daily with supper. 30 tablet 3  . simethicone (GAS-X) 80 MG chewable tablet Chew 1 tablet (80 mg total) by mouth 4 (four) times daily as needed for flatulence. (Patient not taking: Reported on 12/22/2019) 100 tablet 2   No current facility-administered medications on file prior to visit.    Allergies as of 03/29/2020  . (No Known Allergies)     ROS:   General:  No weight loss, Fever, chills  HEENT: No recent headaches, no nasal bleeding, no visual changes, no sore throat  Neurologic: No dizziness, blackouts, seizures. No recent symptoms of stroke or mini- stroke. No recent episodes  of slurred speech, or temporary blindness.  Cardiac: No recent episodes of chest pain/pressure, no shortness of breath at rest.  No shortness of breath with exertion.  Denies history of atrial fibrillation or irregular heartbeat  Vascular: No history of rest pain in feet.  No history of claudication.  No history of non-healing ulcer, + history of DVT   Pulmonary: No home oxygen, no productive cough, no hemoptysis,  No asthma or wheezing  Musculoskeletal:  [ ]  Arthritis, [ ]  Low back pain,  [x ] Joint pain  Hematologic:No history of hypercoagulable state.  No history of easy bleeding.  No history of anemia  Gastrointestinal: No hematochezia or melena,  No gastroesophageal reflux, no trouble swallowing  Urinary: [ ]  chronic Kidney disease, [ ]  on HD - [ ]  MWF or [ ]  TTHS, [ ]  Burning with urination, [ ]  Frequent urination, [ ]  Difficulty urinating;   Skin: No rashes  Psychological: No history of anxiety,  No history of depression  Physical Examination  There were no vitals filed for this visit.  There is no height or weight on file to calculate BMI.  General:  Alert and oriented, no acute distress HEENT: Normal Neck: No bruit or JVD Pulmonary: Clear to auscultation bilaterally Cardiac: Regular Rate and Rhythm without murmur Abdomen: Soft, non-tender, non-distended, no mass, no scars Skin: No rash Extremity Pulses:  2+ radial, brachial, femoral, dorsalis pedis, posterior tibial pulses bilaterally Musculoskeletal: No deformity or edema  Neurologic: Upper and lower extremity motor 5/5 and symmetric  DATA: Venous Reflux Times  +--------------+---------+------+-----------+------------+--------+  RIGHT     Reflux NoRefluxReflux TimeDiameter cmsComments               Yes                   +--------------+---------+------+-----------+------------+--------+  CFV      no                          +--------------+---------+------+-----------+------------+--------+  FV prox    no                         +--------------+---------+------+-----------+------------+--------+  FV mid    no                         +--------------+---------+------+-----------+------------+--------+  FV dist    no                         +--------------+---------+------+-----------+------------+--------+  Popliteal   no                         +--------------+---------+------+-----------+------------+--------+  GSV at SFJ  no               0.594        +--------------+---------+------+-----------+------------+--------+  GSV prox thighno               0.366        +--------------+---------+------+-----------+------------+--------+  GSV mid thigh no               0.361        +--------------+---------+------+-----------+------------+--------+  GSV dist thighno               0.366        +--------------+---------+------+-----------+------------+--------+  GSV at knee  no               0.387        +--------------+---------+------+-----------+------------+--------+  GSV prox calf                 0.405        +--------------+---------+------+-----------+------------+--------+  GSV mid calf                 0.275        +--------------+---------+------+-----------+------------+--------+  SSV Pop Fossa no               0.315        +--------------+---------+------+-----------+------------+--------+  SSV prox calf no               0.225        +--------------+---------+------+-----------+------------+--------+  SSV mid calf                 0.178         +--------------+---------+------+-----------+------------+--------+     +--------------+---------+------+-----------+------------+--------+  LEFT     Reflux NoRefluxReflux TimeDiameter cmsComments               Yes                   +--------------+---------+------+-----------+------------+--------+  CFV      no                         +--------------+---------+------+-----------+------------+--------+  FV prox    no                         +--------------+---------+------+-----------+------------+--------+  FV mid    no                         +--------------+---------+------+-----------+------------+--------+  FV dist    no                         +--------------+---------+------+-----------+------------+--------+  Popliteal         yes  >1 second             +--------------+---------+------+-----------+------------+--------+  GSV at [facility]  no               0.964        +--------------+---------+------+-----------+------------+--------+  GSV prox thighno               0.581        +--------------+---------+------+-----------+------------+--------+  GSV mid thigh no               0.452        +--------------+---------+------+-----------+------------+--------+  GSV dist thighno               0.424        +--------------+---------+------+-----------+------------+--------+  GSV at knee  no               0.335        +--------------+---------+------+-----------+------------+--------+  GSV prox calf                 0.424        +--------------+---------+------+-----------+------------+--------+  GSV mid calf                  0.384        +--------------+---------+------+-----------+------------+--------+  SSV Pop Fossa                 0.379        +--------------+---------+------+-----------+------------+--------+  SSV prox calf                 0.288        +--------------+---------+------+-----------+------------+--------+  SSV mid calf                 0.347        +--------------+---------+------+-----------+------------+--------+  Summary:  Right:  - No evidence of deep vein thrombosis seen in the right lower extremity,  from the common femoral through the popliteal veins.  - No evidence of superficial venous thrombosis in the right lower  extremity.  - No evidence of superficial venous reflux seen in the right greater  saphenous vein.  - No evidence of superficial venous reflux seen in the right short  saphenous vein.    Left:  - No evidence of superficial venous thrombosis in the left lower  extremity.  - No evidence of superficial venous reflux seen in the left greater  saphenous vein.  - No evidence of superficial venous reflux seen in the left short  saphenous vein.  - Color duplex evaluation of the left lower extremity shows there is  thrombus in the popliteal vein.  - Venous reflux is noted in the left popliteal vein.  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    Assessment: Minimal left LE edema without non healing wounds or weeping. She has venous reflux secondary to post thrombotic syndrome since her DVT. She has superficial telangectasia's/reticular veins She has palpable pedal pulses and is not at risk of limb loss.  Plan: She would benefit from compression, but states she can not tolerate it.  Elevation when at rest and stay active.  F/U PRN if she develops symptoms.    Roxy Horseman PA-C [facility] [place] Office: [phone]  MD in clinic  [place]

## 2020-04-06 ENCOUNTER — Other Ambulatory Visit: Payer: Self-pay | Admitting: Oncology

## 2020-06-30 ENCOUNTER — Encounter: Payer: Self-pay | Admitting: *Deleted

## 2020-07-04 ENCOUNTER — Ambulatory Visit (INDEPENDENT_AMBULATORY_CARE_PROVIDER_SITE_OTHER): Payer: Medicare HMO | Admitting: Internal Medicine

## 2020-07-04 ENCOUNTER — Encounter: Payer: Self-pay | Admitting: Internal Medicine

## 2020-07-04 VITALS — BP 146/85 | HR 85 | Temp 97.7°F | Ht 64.0 in | Wt 158.1 lb

## 2020-07-04 DIAGNOSIS — R634 Abnormal weight loss: Secondary | ICD-10-CM | POA: Diagnosis not present

## 2020-07-04 DIAGNOSIS — R1013 Epigastric pain: Secondary | ICD-10-CM | POA: Diagnosis not present

## 2020-07-04 DIAGNOSIS — Z7901 Long term (current) use of anticoagulants: Secondary | ICD-10-CM | POA: Diagnosis not present

## 2020-07-04 DIAGNOSIS — G8929 Other chronic pain: Secondary | ICD-10-CM | POA: Insufficient documentation

## 2020-07-04 DIAGNOSIS — D689 Coagulation defect, unspecified: Secondary | ICD-10-CM | POA: Diagnosis not present

## 2020-07-04 MED ORDER — OMEPRAZOLE MAGNESIUM 20 MG PO TBEC: 20.0000 mg | DELAYED_RELEASE_TABLET | Freq: Every day | ORAL | 1 refills | Status: DC

## 2020-07-04 NOTE — Assessment & Plan Note (Addendum)
Patient presents to the clinic with 3 months of intermittent nausea, belching, bloating, stomach pain, early satiety, and occasional diarrhea, which have increased in frequency from 1-3 times a week to every day over the last 3 weeks.   Kimberly Walters states that she notices that her symptoms are worsened with dairy products (milk and ice cream), but her symptoms have been exacerbated by other non dairy products such as baked fish and tomato sandwiches. Her symptoms occur within several minutes to an hour after eating.   She does most of the cooking for her family, she has tried to minimize her food intake as this appears to also help her symptoms. She is a 5 cup a day coffee drinker, preferring [employer] regular blend. She does not use NSAIDS due to being on chronic anticoagulation. She has a Hx of smoking and is a current every day smoker.   She does endorse that her grandmother, mother, and sister have all had their gallbladders removed.   A/P:  Patient presenting with signs and symptoms concerning for dyspepsia. On physical examination, she has mild, generalized tenderness to palpation on physical examination. No Murphy's sign present, and symptoms present whether or not she is having a fatty meal, which makes cholecystitis less likely. She has no temperature, or signs of infection. She does not take NSAIDS, narcotics, or aspirin. She is on Xarelto for chronic anticoagulation and has had prior bleeding events.  she has had weight loss,  70 years of age, and current every day smoker, which is concerning for possible underlying malignancy.   We spoke about avoiding foods that are currently exacerbating her symptoms, and cutting back on her caffeine consumption. We will additionally start her on a PPI today. Given her symptoms, age, weight loss, I am concerned for an underlying malignancy. Possible differential includes GERD, H. Pylori, PUD, underlying malignancy. Will assess for H. Pylori, and start  PPI therapy. Ordering labs in the setting of her chronic anemia and evaluate for iron deficiency. I Will also refer to GI for assessment and possible EGD given her age and weight loss. - Avoiding food, smoking cessation, and decrease caffeine.  - CBC with Iron panel - H. Pylori stool antigen, pending result will start eradication therapy.  - Protonix 20 mg  - Referral to GI for EGD.  Addendum:  H. Pylori came back +, spoke with patient and discussed results. Will starte quadruple therapy with pepto-bismol, protonix 40 mg BID, metronidazole 250 mg QID, tetracycline 500 mg QID. Will have follow up in 6 weeks.

## 2020-07-04 NOTE — Progress Notes (Signed)
   CC: Nausea, bloating, loose stools  HPI:  [name] is a 70 y.o. F, with a PMH noted below, who presents to the clinic Nausea, bloating, loose stools. To see the management of their acute and chronic conditions, please see the A&P note under the Encounters tab.   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 Negative for chills, diaphoresis, fever and malaise/fatigue.  Respiratory:  Negative for cough, hemoptysis, shortness of breath and wheezing.   Cardiovascular:  Negative for chest pain, palpitations and orthopnea.  Gastrointestinal:  Positive for abdominal pain, diarrhea and nausea. Negative for blood in stool, constipation, melena and vomiting.  Genitourinary:  Negative for dysuria, flank pain, frequency and urgency.  Neurological:  Negative for dizziness, weakness and headaches.    Physical Exam:  Vitals:   07/04/20 0914  BP: (!) 146/85  Pulse: 85  Temp: 97.7 F (36.5 C)  TempSrc: Oral  SpO2: 98%  Weight: 158 lb 1.6 oz (71.7 kg)  Height: 5\' 4"  (1.626 m)   Physical Exam Constitutional:      General: She is not in acute distress.    Appearance: She is well-developed.     Comments: Appears stated age, frequent belching during interview, answers questions appropriately.   Cardiovascular:     Rate and Rhythm: Normal rate and regular rhythm.     Heart sounds: Normal heart sounds. No murmur heard.   No friction rub. No gallop.  Pulmonary:     Effort: Pulmonary effort is  normal.     Breath sounds: Normal breath sounds.  Abdominal:     General: Abdomen is flat. Bowel sounds are normal. There is no distension or abdominal bruit.     Palpations: Abdomen is soft. There is no shifting dullness, fluid wave or pulsatile mass.     Tenderness: There is generalized abdominal tenderness. There is no guarding. Negative signs include Murphy's sign and McBurney's sign.     Comments: BS +, mild generalized tenderness to palpation.   Neurological:     Mental Status: She is alert.  Psychiatric:        Mood and Affect: Mood normal.        Behavior: Behavior normal.     Assessment & Plan:   See Encounters Tab for problem based charting.  Patient discussed with Dr. Philipp Ovens

## 2020-07-04 NOTE — Patient Instructions (Addendum)
Ms. Sochacki,   It was a pleasure meeting you today!  Today we discussed your GI issues. We will run a number of tests today to assess for any issues. We will also get a stool sample to rule out any stomach infection. We will additionally send you in a referral for a stomach doctor to evaluate you as well. In the mean time, I would cut back on the dairy products and cut back on your caffeine intake. I will also start you on a daily medication to help with your nausea and bloating, called omeprazole.  Maudie Mercury, MD

## 2020-07-05 LAB — CBC WITH DIFFERENTIAL/PLATELET
Basophils Absolute: 0.1 10*3/uL (ref 0.0–0.2)
Basos: 1 %
EOS (ABSOLUTE): 0.1 10*3/uL (ref 0.0–0.4)
Eos: 1 %
Hematocrit: 47.2 % — ABNORMAL HIGH (ref 34.0–46.6)
Hemoglobin: 15.5 g/dL (ref 11.1–15.9)
Immature Grans (Abs): 0 10*3/uL (ref 0.0–0.1)
Immature Granulocytes: 0 %
Lymphocytes Absolute: 1.8 10*3/uL (ref 0.7–3.1)
Lymphs: 18 %
MCH: 30.3 pg (ref 26.6–33.0)
MCHC: 32.8 g/dL (ref 31.5–35.7)
MCV: 92 fL (ref 79–97)
Monocytes Absolute: 0.7 10*3/uL (ref 0.1–0.9)
Monocytes: 7 %
Neutrophils Absolute: 7.4 10*3/uL — ABNORMAL HIGH (ref 1.4–7.0)
Neutrophils: 73 %
Platelets: 218 10*3/uL (ref 150–450)
RBC: 5.11 x10E6/uL (ref 3.77–5.28)
RDW: 13.3 % (ref 11.7–15.4)
WBC: 10 10*3/uL (ref 3.4–10.8)

## 2020-07-05 LAB — IRON AND TIBC
Iron Saturation: 18 % (ref 15–55)
Iron: 63 ug/dL (ref 27–139)
Total Iron Binding Capacity: 349 ug/dL (ref 250–450)
UIBC: 286 ug/dL (ref 118–369)

## 2020-07-05 LAB — FERRITIN: Ferritin: 75 ng/mL (ref 15–150)

## 2020-07-06 ENCOUNTER — Other Ambulatory Visit: Payer: Medicare HMO

## 2020-07-06 DIAGNOSIS — R1013 Epigastric pain: Secondary | ICD-10-CM

## 2020-07-09 LAB — H. PYLORI ANTIGEN, STOOL: H pylori Ag, Stl: POSITIVE — AB

## 2020-07-11 ENCOUNTER — Encounter: Payer: Self-pay | Admitting: Internal Medicine

## 2020-07-11 MED ORDER — PEPTO-BISMOL 524 MG/30ML PO SUSP: 30.0000 mL | Freq: Four times a day (QID) | ORAL | 2 refills | Status: DC | PRN

## 2020-07-11 MED ORDER — PANTOPRAZOLE SODIUM 40 MG PO TBEC: 40.0000 mg | DELAYED_RELEASE_TABLET | Freq: Two times a day (BID) | ORAL | 0 refills | Status: DC

## 2020-07-11 MED ORDER — TETRACYCLINE HCL 500 MG PO CAPS: 500.0000 mg | ORAL_CAPSULE | Freq: Four times a day (QID) | ORAL | 0 refills | Status: DC

## 2020-07-11 MED ORDER — METRONIDAZOLE 250 MG PO TABS: 250.0000 mg | ORAL_TABLET | Freq: Four times a day (QID) | ORAL | 0 refills | Status: DC

## 2020-07-11 NOTE — Addendum Note (Signed)
Addended by: Maudie Mercury C on: 07/11/2020 11:36 AM   Modules accepted: Orders

## 2020-07-12 ENCOUNTER — Encounter: Payer: Self-pay | Admitting: Gastroenterology

## 2020-07-13 NOTE — Progress Notes (Signed)
Internal Medicine Attending Note:  This patient's plan of care was discussed with the house staff. Please see their note for complete details. I concur with their findings.  Velna Ochs, MD 07/13/2020, 2:06 PM

## 2020-07-18 ENCOUNTER — Emergency Department (HOSPITAL_BASED_OUTPATIENT_CLINIC_OR_DEPARTMENT_OTHER): Payer: Medicare HMO

## 2020-07-18 ENCOUNTER — Observation Stay (HOSPITAL_BASED_OUTPATIENT_CLINIC_OR_DEPARTMENT_OTHER)
Admission: EM | Admit: 2020-07-18 | Discharge: 2020-07-19 | Disposition: A | Discharge status: Home / Self Care | Payer: Medicare HMO | Attending: Student in an Organized Health Care Education/Training Program | Admitting: Student in an Organized Health Care Education/Training Program

## 2020-07-18 ENCOUNTER — Encounter (HOSPITAL_BASED_OUTPATIENT_CLINIC_OR_DEPARTMENT_OTHER): Payer: Self-pay

## 2020-07-18 ENCOUNTER — Other Ambulatory Visit: Payer: Self-pay

## 2020-07-18 DIAGNOSIS — Z800 Family history of malignant neoplasm of digestive organs: Secondary | ICD-10-CM | POA: Diagnosis not present

## 2020-07-18 DIAGNOSIS — Z20822 Contact with and (suspected) exposure to covid-19: Secondary | ICD-10-CM | POA: Insufficient documentation

## 2020-07-18 DIAGNOSIS — R1013 Epigastric pain: Secondary | ICD-10-CM | POA: Diagnosis not present

## 2020-07-18 DIAGNOSIS — A048 Other specified bacterial intestinal infections: Secondary | ICD-10-CM | POA: Diagnosis not present

## 2020-07-18 DIAGNOSIS — G8929 Other chronic pain: Secondary | ICD-10-CM

## 2020-07-18 DIAGNOSIS — Z86711 Personal history of pulmonary embolism: Secondary | ICD-10-CM | POA: Diagnosis not present

## 2020-07-18 DIAGNOSIS — R0789 Other chest pain: Secondary | ICD-10-CM | POA: Diagnosis not present

## 2020-07-18 DIAGNOSIS — K2970 Gastritis, unspecified, without bleeding: Secondary | ICD-10-CM | POA: Diagnosis not present

## 2020-07-18 DIAGNOSIS — Z79899 Other long term (current) drug therapy: Secondary | ICD-10-CM | POA: Insufficient documentation

## 2020-07-18 DIAGNOSIS — K5730 Diverticulosis of large intestine without perforation or abscess without bleeding: Secondary | ICD-10-CM | POA: Diagnosis not present

## 2020-07-18 DIAGNOSIS — I10 Essential (primary) hypertension: Secondary | ICD-10-CM | POA: Diagnosis not present

## 2020-07-18 DIAGNOSIS — Z7901 Long term (current) use of anticoagulants: Secondary | ICD-10-CM | POA: Diagnosis not present

## 2020-07-18 DIAGNOSIS — Z87891 Personal history of nicotine dependence: Secondary | ICD-10-CM | POA: Diagnosis not present

## 2020-07-18 DIAGNOSIS — R1084 Generalized abdominal pain: Secondary | ICD-10-CM | POA: Diagnosis not present

## 2020-07-18 DIAGNOSIS — I480 Paroxysmal atrial fibrillation: Secondary | ICD-10-CM | POA: Insufficient documentation

## 2020-07-18 DIAGNOSIS — B9681 Helicobacter pylori [H. pylori] as the cause of diseases classified elsewhere: Secondary | ICD-10-CM | POA: Diagnosis present

## 2020-07-18 DIAGNOSIS — R079 Chest pain, unspecified: Secondary | ICD-10-CM | POA: Diagnosis not present

## 2020-07-18 DIAGNOSIS — Z720 Tobacco use: Secondary | ICD-10-CM | POA: Diagnosis present

## 2020-07-18 DIAGNOSIS — K269 Duodenal ulcer, unspecified as acute or chronic, without hemorrhage or perforation: Secondary | ICD-10-CM | POA: Diagnosis not present

## 2020-07-18 LAB — URINALYSIS, ROUTINE W REFLEX MICROSCOPIC
Bilirubin Urine: NEGATIVE
Glucose, UA: NEGATIVE mg/dL
Hgb urine dipstick: NEGATIVE
Ketones, ur: NEGATIVE mg/dL
Leukocytes,Ua: NEGATIVE
Nitrite: NEGATIVE
Protein, ur: NEGATIVE mg/dL
Specific Gravity, Urine: 1.039 — ABNORMAL HIGH (ref 1.005–1.030)
pH: 7 (ref 5.0–8.0)

## 2020-07-18 LAB — CBC
HCT: 43 % (ref 36.0–46.0)
HCT: 43.8 % (ref 36.0–46.0)
Hemoglobin: 14.4 g/dL (ref 12.0–15.0)
Hemoglobin: 14.6 g/dL (ref 12.0–15.0)
MCH: 30.8 pg (ref 26.0–34.0)
MCH: 30.8 pg (ref 26.0–34.0)
MCHC: 33.5 g/dL (ref 30.0–36.0)
MCHC: 33.3 g/dL (ref 30.0–36.0)
MCV: 92.1 fL (ref 80.0–100.0)
MCV: 92.4 fL (ref 80.0–100.0)
Platelets: 195 10*3/uL (ref 150–400)
Platelets: 229 K/uL (ref 150–400)
RBC: 4.67 MIL/uL (ref 3.87–5.11)
RBC: 4.74 MIL/uL (ref 3.87–5.11)
RDW: 13.2 % (ref 11.5–15.5)
RDW: 13 % (ref 11.5–15.5)
WBC: 9.2 10*3/uL (ref 4.0–10.5)
WBC: 7.7 K/uL (ref 4.0–10.5)
nRBC: 0 % (ref 0.0–0.2)
nRBC: 0 % (ref 0.0–0.2)

## 2020-07-18 LAB — COMPREHENSIVE METABOLIC PANEL WITH GFR
ALT: 15 U/L (ref 0–44)
AST: 25 U/L (ref 15–41)
Albumin: 3.4 g/dL — ABNORMAL LOW (ref 3.5–5.0)
Alkaline Phosphatase: 56 U/L (ref 38–126)
Anion gap: 13 (ref 5–15)
BUN: 12 mg/dL (ref 8–23)
CO2: 25 mmol/L (ref 22–32)
Calcium: 8.9 mg/dL (ref 8.9–10.3)
Chloride: 100 mmol/L (ref 98–111)
Creatinine, Ser: 0.77 mg/dL (ref 0.44–1.00)
GFR, Estimated: >60 mL/min
Glucose, Bld: 92 mg/dL (ref 70–99)
Potassium: 4.6 mmol/L (ref 3.5–5.1)
Sodium: 138 mmol/L (ref 135–145)
Total Bilirubin: 1.4 mg/dL — ABNORMAL HIGH (ref 0.3–1.2)
Total Protein: 5.5 g/dL — ABNORMAL LOW (ref 6.5–8.1)

## 2020-07-18 LAB — BASIC METABOLIC PANEL
Anion gap: 9 (ref 5–15)
BUN: 16 mg/dL (ref 8–23)
CO2: 28 mmol/L (ref 22–32)
Calcium: 9.4 mg/dL (ref 8.9–10.3)
Chloride: 105 mmol/L (ref 98–111)
Creatinine, Ser: 0.78 mg/dL (ref 0.44–1.00)
GFR, Estimated: >60 mL/min (ref >60)
Glucose, Bld: 90 mg/dL (ref 70–99)
Potassium: 3.7 mmol/L (ref 3.5–5.1)
Sodium: 142 mmol/L (ref 135–145)

## 2020-07-18 LAB — HEPATIC FUNCTION PANEL
ALT: 12 U/L (ref 0–44)
AST: 12 U/L — ABNORMAL LOW (ref 15–41)
Albumin: 4 g/dL (ref 3.5–5.0)
Alkaline Phosphatase: 60 U/L (ref 38–126)
Bilirubin, Direct: 0.1 mg/dL (ref 0.0–0.2)
Indirect Bilirubin: 0.4 mg/dL (ref 0.3–0.9)
Total Bilirubin: 0.5 mg/dL (ref 0.3–1.2)
Total Protein: 6.4 g/dL — ABNORMAL LOW (ref 6.5–8.1)

## 2020-07-18 LAB — TROPONIN I (HIGH SENSITIVITY)
Troponin I (High Sensitivity): 12 ng/L (ref <18)
Troponin I (High Sensitivity): 12 ng/L (ref <18)

## 2020-07-18 LAB — RESP PANEL BY RT-PCR (FLU A&B, COVID) ARPGX2
Influenza A by PCR: NEGATIVE
Influenza B by PCR: NEGATIVE
SARS Coronavirus 2 by RT PCR: NEGATIVE

## 2020-07-18 LAB — HEMOGLOBIN A1C
Hgb A1c MFr Bld: 6.4 % — ABNORMAL HIGH (ref 4.8–5.6)
Mean Plasma Glucose: 137 mg/dL

## 2020-07-18 LAB — HIV ANTIBODY (ROUTINE TESTING W REFLEX): HIV Screen 4th Generation wRfx: NONREACTIVE

## 2020-07-18 LAB — MAGNESIUM: Magnesium: 2.1 mg/dL (ref 1.7–2.4)

## 2020-07-18 LAB — PHOSPHORUS: Phosphorus: 3.3 mg/dL (ref 2.5–4.6)

## 2020-07-18 LAB — LIPASE, BLOOD: Lipase: 31 U/L (ref 11–51)

## 2020-07-18 MED ORDER — LIDOCAINE VISCOUS HCL 2 % MT SOLN
15.0000 mL | Freq: Once | OROMUCOSAL | Status: AC
  Administered 2020-07-18: 15 mL via ORAL
  Filled 2020-07-18: qty 15

## 2020-07-18 MED ORDER — SUCRALFATE 1 GM/10ML PO SUSP
1.0000 g | Freq: Three times a day (TID) | ORAL | Status: DC | PRN
  Filled 2020-07-18: qty 10

## 2020-07-18 MED ORDER — TETRACYCLINE HCL 250 MG PO CAPS
500.0000 mg | ORAL_CAPSULE | Freq: Four times a day (QID) | ORAL | Status: DC
  Administered 2020-07-18 (×3): 500 mg via ORAL
  Filled 2020-07-18 (×8): qty 2

## 2020-07-18 MED ORDER — RIVAROXABAN 20 MG PO TABS: 20.0000 mg | ORAL_TABLET | Freq: Every day | ORAL | Status: DC

## 2020-07-18 MED ORDER — NICOTINE 7 MG/24HR TD PT24
7.0000 mg | MEDICATED_PATCH | Freq: Every day | TRANSDERMAL | Status: DC
  Administered 2020-07-18: 7 mg via TRANSDERMAL
  Filled 2020-07-18 (×2): qty 1

## 2020-07-18 MED ORDER — IOHEXOL 350 MG/ML SOLN
100.0000 mL | Freq: Once | INTRAVENOUS | Status: AC | PRN
  Administered 2020-07-18: 100 mL via INTRAVENOUS

## 2020-07-18 MED ORDER — ACETAMINOPHEN 325 MG PO TABS
650.0000 mg | ORAL_TABLET | Freq: Four times a day (QID) | ORAL | Status: DC | PRN
  Administered 2020-07-18: 650 mg via ORAL
  Filled 2020-07-18: qty 2

## 2020-07-18 MED ORDER — METRONIDAZOLE 500 MG PO TABS
250.0000 mg | ORAL_TABLET | Freq: Four times a day (QID) | ORAL | Status: DC
  Administered 2020-07-18 (×3): 250 mg via ORAL
  Filled 2020-07-18 (×3): qty 1

## 2020-07-18 MED ORDER — BISMUTH SUBSALICYLATE 262 MG/15ML PO SUSP: 30.0000 mL | Freq: Four times a day (QID) | ORAL | Status: DC | PRN

## 2020-07-18 MED ORDER — PRAVASTATIN SODIUM 10 MG PO TABS
20.0000 mg | ORAL_TABLET | Freq: Every day | ORAL | Status: DC
  Administered 2020-07-18: 20 mg via ORAL
  Filled 2020-07-18: qty 2

## 2020-07-18 MED ORDER — PANTOPRAZOLE SODIUM 40 MG PO TBEC
40.0000 mg | DELAYED_RELEASE_TABLET | Freq: Two times a day (BID) | ORAL | Status: DC
  Administered 2020-07-18 (×2): 40 mg via ORAL
  Filled 2020-07-18 (×2): qty 1

## 2020-07-18 MED ORDER — LISINOPRIL 20 MG PO TABS
20.0000 mg | ORAL_TABLET | Freq: Every day | ORAL | Status: DC
  Administered 2020-07-18: 20 mg via ORAL
  Filled 2020-07-18: qty 1

## 2020-07-18 MED ORDER — FAMOTIDINE IN NACL 20-0.9 MG/50ML-% IV SOLN
20.0000 mg | Freq: Once | INTRAVENOUS | Status: AC
  Administered 2020-07-18: 20 mg via INTRAVENOUS
  Filled 2020-07-18: qty 50

## 2020-07-18 MED ORDER — BISMUTH SUBSALICYLATE 262 MG PO CHEW
524.0000 mg | CHEWABLE_TABLET | Freq: Four times a day (QID) | ORAL | Status: DC
  Administered 2020-07-18 (×2): 524 mg via ORAL
  Filled 2020-07-18 (×7): qty 2

## 2020-07-18 MED ORDER — ACETAMINOPHEN 650 MG RE SUPP: 650.0000 mg | Freq: Four times a day (QID) | RECTAL | Status: DC | PRN

## 2020-07-18 MED ORDER — POLYETHYLENE GLYCOL 3350 17 G PO PACK: 17.0000 g | PACK | Freq: Every day | ORAL | Status: DC | PRN

## 2020-07-18 MED ORDER — ALUM & MAG HYDROXIDE-SIMETH 200-200-20 MG/5ML PO SUSP
30.0000 mL | Freq: Once | ORAL | Status: AC
  Administered 2020-07-18: 30 mL via ORAL
  Filled 2020-07-18 (×2): qty 30

## 2020-07-18 NOTE — ED Provider Notes (Signed)
[facility] DE[facility] Provider Note   CSN: [id number] Arrival date & time: 07/18/20  0022     History Chief Complaint  Patient presents with   Abdominal Pain   Chest Pain    Kimberly Walters is a 70 y.o. female.  Patient with a history of atrial fibrillation, previous pulmonary embolism on Xarelto, recent diagnosis of dyspepsia and H. pylori on treatment here with episode of chest pain and pressure.  States she has chronic abdominal pain which has been ongoing for several months associated with nausea.  She is taking medication for H. pylori from her PCP.  She states today while walking down the hallway she had chest pressure in the center of her chest rating to her upper back.  It was associated with dizziness, nausea, lightheadedness and shortness of breath.  She is not certain how long it lasted.  Maybe 20 or 30 minutes and is now resolved.  She did receive aspirin and nitroglycerin from EMS.  She has never had this kind of pain before.  Denies any cardiac history.  Reports negative stress test in the 1980s.  She has never had an EGD. Her abdominal pain is at baseline.  No recent vomiting, black or bloody stools.  The history is provided by the patient.  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    Gross hematuria    Hypertension    Mitral valve prolapse    Paroxysmal atrial fibrillation with RVR (HCC)    Pleural effusion on left    Post-thrombotic syndrome of left lower extremity    Primary  hypercoagulable state ([place])    Pulmonary emboli ([place])    Pyelonephritis     Patient Active Problem List   Diagnosis Date Noted   Dyspepsia 07/04/2020   Localized osteoporosis with current pathological fracture 02/18/2020   Asymptomatic varicose veins of both lower extremities 02/18/2020   Aortic atherosclerosis ([place]) 08/30/2019   Pyelonephritis 07/30/2019   Pleural effusion on left 07/30/2019   Anticoagulant-induced bleeding ([place]) 07/28/2019   Primary hypercoagulable state ([place]) 07/22/2019   Long term (current) use of anticoagulants 07/22/2019   Pulmonary embolism ([place]) 07/20/2019   Acute respiratory failure with hypoxia (HCC)    Paroxysmal atrial fibrillation with RVR (HCC)    Emphysema, unspecified ([place]) 07/13/2019   Acute pulmonary embolism ([place]) 07/13/2019   Screening for colon cancer 03/16/2019   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     Family History  Problem Relation Age of Onset   Diabetes Mother    Heart disease Mother    Cancer Mother        Uterine  Diabetes Father    CAD Father    Heart attack Father    Hypothyroidism Sister    Melanoma Sister    Clotting disorder Sister    Cancer Sister        stomach   Diabetes Sister    Ovarian cancer Other    Uterine cancer Other    Breast cancer Other    Diabetes Son    Uterine cancer Niece    Pancreatic cancer Nephew    Diabetes Sister    Hypertension Sister    Hypothyroidism Sister    Diabetes Sister    Hypertension Sister    Hypothyroidism Sister     Social History   Tobacco Use   Smoking status: Former    Packs/day:  0.01    Pack years: 0.00    Types: Cigarettes    Quit date: 07/01/2019    Years since quitting: 1.0   Smokeless tobacco: Never   Tobacco comments:    1-2 per day   Substance Use Topics   Alcohol use: No   Drug use: No    Home Medications Prior to Admission medications   Medication Sig Start Date End Date Taking? Authorizing Provider  acetaminophen (TYLENOL) 500 MG tablet Take 1,000 mg by mouth every 6 (six) hours as needed for mild pain.    [provider]  alendronate (FOSAMAX) 70 MG tablet Take 1 tablet (70 mg total) by mouth every 7 (seven) days. Take with a full glass of water on an empty stomach. 02/16/20 02/15/21  Bloomfield, Nila Nephew D, DO  bismuth subsalicylate (PEPTO-BISMOL) 262 MG/15ML suspension Take 30 mLs by mouth 4 (four) times daily as needed. 07/11/20 07/11/21  Maudie Mercury, MD  cholecalciferol (VITAMIN D) 25 MCG (1000 UNIT) tablet Take 1 tablet (1,000 Units total) by mouth daily. 02/17/20   Bloomfield, [name], DO  lisinopril (ZESTRIL) 20 MG tablet Take 1 tablet (20 mg total) by mouth daily. 09/09/19   Andrew Au, MD  metroNIDAZOLE (FLAGYL) 250 MG tablet Take 1 tablet (250 mg total) by mouth 4 (four) times daily for 12 days. 07/11/20 07/23/20  Maudie Mercury, MD  omeprazole (PRILOSEC OTC) 20 MG tablet Take 1 tablet (20 mg total) by mouth daily. 07/04/20 09/02/20  Maudie Mercury, MD  pantoprazole (PROTONIX) 40 MG tablet Take 1 tablet (40 mg total) by mouth 2 (two) times daily for 12 days. 07/11/20 07/23/20  Maudie Mercury, MD  pravastatin (PRAVACHOL) 10 MG tablet Take 1 tablet (10 mg total) by mouth daily. 09/09/19 09/08/20  Andrew Au, MD  simethicone (GAS-X) 80 MG chewable tablet Chew 1 tablet (80 mg total) by mouth 4 (four) times daily as needed for flatulence. 10/14/19 10/13/20  Maudie Mercury, MD  tetracycline (SUMYCIN) 500 MG capsule Take 1 capsule (500 mg total) by mouth 4 (four) times daily for 12 days. 07/11/20 07/23/20  Maudie Mercury, MD  XARELTO 20 MG TABS  tablet TAKE 1 TABLET(20 MG) BY MOUTH DAILY WITH SUPPER 04/06/20   Wyatt Portela, MD    Allergies    Patient has no known allergies.  Review of Systems   Review of Systems  Constitutional:  Negative for activity change, appetite change and fever.  HENT:  Negative for congestion and rhinorrhea.   Respiratory:  Positive for chest tightness and shortness of breath.   Cardiovascular:  Positive for chest pain.  Gastrointestinal:  Positive for abdominal pain and nausea. Negative for vomiting.  Genitourinary:  Negative for dysuria and hematuria.  Musculoskeletal:  Positive  for back pain. Negative for arthralgias and myalgias.  Skin:  Negative for rash.  Neurological:  Negative for dizziness, weakness and headaches.   all other systems are negative except as noted in the HPI and PMH.   Physical Exam Updated Vital Signs BP (!) 163/93 (BP Location: Right Arm)   Pulse 73   Temp 97.9 F (36.6 C) (Oral)   Resp (!) 26   Ht 5\' 4"  (1.626 m)   Wt 71.7 kg   LMP 06/21/1973   SpO2 95%   BMI 27.12 kg/m   Physical Exam Vitals and nursing note reviewed.  Constitutional:      General: She is not in acute distress.    Appearance: She is well-developed.  HENT:     Head: Normocephalic and atraumatic.     Mouth/Throat:     Pharynx: No oropharyngeal exudate.  Eyes:     Conjunctiva/sclera: Conjunctivae normal.     Pupils: Pupils are equal, round, and reactive to light.  Neck:     Comments: No meningismus. Cardiovascular:     Rate and Rhythm: Normal rate and regular rhythm.     Heart sounds: Normal heart sounds. No murmur heard. Pulmonary:     Effort: Pulmonary effort is normal. No respiratory distress.     Breath sounds: Normal breath sounds.  Abdominal:     Palpations: Abdomen is soft.     Tenderness: There is abdominal tenderness. There is no guarding or rebound.     Comments: Diffusely tender, worse in the epigastrium  Musculoskeletal:        General: No tenderness. Normal range of  motion.     Cervical back: Normal range of motion and neck supple.  Skin:    General: Skin is warm.  Neurological:     Mental Status: She is alert and oriented to person, place, and time.     Cranial Nerves: No cranial nerve deficit.     Motor: No abnormal muscle tone.     Coordination: Coordination normal.     Comments:  5/5 strength throughout. CN 2-12 intact.Equal grip strength.   Psychiatric:        Behavior: Behavior normal.    ED Results / Procedures / Treatments   Labs (all labs ordered are listed, but only abnormal results are displayed) Labs Reviewed  HEPATIC FUNCTION PANEL - Abnormal; Notable for the following components:      Result Value   Total Protein 6.4 (*)    AST 12 (*)    All other components within normal limits  RESP PANEL BY RT-PCR (FLU A&B, COVID) ARPGX2  BASIC METABOLIC PANEL  CBC  LIPASE, BLOOD  URINALYSIS, ROUTINE W REFLEX MICROSCOPIC  TROPONIN I (HIGH SENSITIVITY)  TROPONIN I (HIGH SENSITIVITY)    EKG EKG Interpretation  Date/Time:  Tuesday July 18 2020 00:28:27 EDT Ventricular Rate:  73 PR Interval:  160 QRS Duration: 107 QT Interval:  428 QTC Calculation: 472 R Axis:   75 Text Interpretation: Sinus rhythm No significant change was found Confirmed by Ezequiel Essex [phone]) on 07/18/2020 12:51:17 AM  Radiology DG Chest Portable 1 View  Result Date: 07/18/2020 CLINICAL DATA:  Chest pain EXAM: PORTABLE CHEST 1 VIEW COMPARISON:  None. FINDINGS: Heart is upper limits normal in size. No confluent opacities or effusions. No acute bony abnormality. IMPRESSION: No active disease. Electronically Signed   By: Rolm Baptise M.D.   On: 07/18/2020 01:00   CT Angio Chest/Abd/Pel for Dissection W and/or Wo Contrast  Result Date: 07/18/2020 CLINICAL  DATA:  Chest pain, abdominal pain EXAM: CT ANGIOGRAPHY CHEST, ABDOMEN AND PELVIS TECHNIQUE: Non-contrast CT of the chest was initially obtained. Multidetector CT imaging through the chest, abdomen and pelvis  was performed using the standard protocol during bolus administration of intravenous contrast. Multiplanar reconstructed images and MIPs were obtained and reviewed to evaluate the vascular anatomy. CONTRAST:  123mL OMNIPAQUE IOHEXOL 350 MG/ML SOLN COMPARISON:  01/28/2020 FINDINGS: CTA CHEST FINDINGS Cardiovascular: Scattered aortic calcifications. No aneurysm or dissection. Heart is normal size. Again noted is occlusion of a left lower lobe segmental arterial branch compatible with pulmonary embolus. This was present on prior study and on prior study dating back to June of 2021 compatible with chronic emboli. Mediastinum/Nodes: No mediastinal, hilar, or axillary adenopathy. Trachea and esophagus are unremarkable. Thyroid unremarkable. Lungs/Pleura: Emphysema. Linear areas of scarring in the lung bases. No effusions. Musculoskeletal: Chest wall soft tissues are unremarkable. Moderate to severe compression fracture of the T11 vertebral body, described as T12 on prior study. This has progressed since prior study. Review of the MIP images confirms the above findings. CTA ABDOMEN AND PELVIS FINDINGS VASCULAR Aorta: Aortic atherosclerosis.  No aneurysm or dissection. Celiac: Patent SMA: Patent Renals: Patent IMA: Patent Inflow: Atherosclerosis.  No aneurysm or dissection. Veins: No obvious venous abnormality within the limitations of this arterial phase study. Review of the MIP images confirms the above findings. NON-VASCULAR Hepatobiliary: No focal hepatic abnormality. Gallbladder unremarkable. Pancreas: No focal abnormality or ductal dilatation. Spleen: No focal abnormality.  Normal size. Adrenals/Urinary Tract: Large cyst in the mid to lower pole of the right kidney. No hydronephrosis. Adrenal glands and urinary bladder unremarkable. Stomach/Bowel: Left colonic diverticulosis. No active diverticulitis. Stomach and small bowel decompressed. Lymphatic: No adenopathy Reproductive: Prior hysterectomy.  No adnexal masses.  Other: No free fluid or free air. Musculoskeletal: No acute bony abnormality. Mild compression deformity at L1, stable. Review of the MIP images confirms the above findings. IMPRESSION: No evidence of aortic aneurysm or dissection. 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 Bibasilar scarring. Left colonic diverticulosis.  No active diverticulitis. Electronically Signed   By: Rolm Baptise M.D.   On: 07/18/2020 02:24    Procedures Procedures   Medications Ordered in ED Medications - No data to display  ED Course  I have reviewed the triage vital signs and the nursing notes.  Pertinent labs & imaging results that were available during my care of the patient were reviewed by me and considered in my medical decision making (see chart for details).    MDM Rules/Calculators/A&P                         Episode of exertional chest pain, now resolved.  EKG sinus rhythm without acute ST changes.  Ongoing abdominal pain with recent diagnosis of dyspepsia and H. pylori positive  Patient received aspirin and nitroglycerin by EMS.  Her chest pain has resolved but she still has ongoing abdominal pain.  Labs are reassuring with normal LFTs and lipase.  Troponin is negative.  With history of pulmonary emboli, CT scan is obtained to rule out aortic pathology or new pulmonary emboli. No acute intra-abdominal findings identified.  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  No evidence of arctic dissection or aneurysm.  No further chest pain.  Heart score is 4-5.  Cannot rule out CAD though her chest pain may be related to her ongoing GI issues. Discussed with internal medicine residents. They  state that they cannot admit the patient directly and wish to see her in the [facility] ED.  D/w [facility] who accepts patient. Dr. Marianna Payment of internal medicine teaching service aware, and will see patient on arrival.  Final Clinical Impression(s) / ED Diagnoses Final  diagnoses:  Exertional chest pain    Rx / DC Orders ED Discharge Orders     None        Jazmen Lindenbaum, Annie Main, MD 07/18/20 [phone]

## 2020-07-18 NOTE — H&P (View-Only) (Signed)
Referring Provider:  [facility] [place] [facility]       Primary Care Physician:  Farrel Gordon, DO Primary Gastroenterologist: Althia Forts but has new patient appointment with [place] late July            We were asked to see this patient for:     abdominal pain              Attending physician's note   I have taken a history, examined the patient and reviewed the chart. I agree with the Advanced Practitioner's note, impression and recommendations.  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 after midnight  The risks and benefits as well as alternatives of endoscopic procedure(s) have been discussed and reviewed. All questions answered. The patient agrees to proceed.   Further work-up and recommendation based on endoscopic findings   The patient was provided an opportunity to ask questions and all were answered. The patient agreed with the plan and demonstrated an understanding of the instructions.  Damaris Hippo , MD [phone]     ASSESSMENT / PLAN:   # 70 yo female with a three month history of generalized upper abdominal pain, worse with eating and associated with nausea, early satiety and weight loss. Labs essentially unremarkable. CTA showing patent celiac, SMA, IMA and no gallbladder or pancreatic abnormalities. Currently on 6th day of H.pylori quadruple treatment. She could have PUD as cause for pain.  --Will schedule for EGD. The risks and benefits of EGD with possible biopsies was discussed with the patient and they agree to proceed. Will schedule this to be done tomorrow though Dr. Silverio Decamp may decide to hold off to give more time for Eliquis washout ( last dose 7 pm last evening) --Continue quadruple tx of H.pylori --If EGD negative, consider RUQ [place] to look for cholelithiasis .   #  H.pylori infection ( + stool Ag) on 07/06/20) --Getting flagyl, tetracycline pantoprazole. Will add Bismuth.  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 Followed by Dr. Alen Blew.   # Afib on Eliquis at home   # Osteoporosis, T11 vertebral body fracture ( moderate to severe) on imaging in January.  CT scan this admission suggests progression of fracture.        HPI:                                                                                                                             Chief Complaint: upper abdominal pain   ARYIAH MONTEROSSO is a 70 y.o. female with a past medical history significant for hypercoagulable state ( followed by Hematology), Afib on Xarelto,  PE, HTN, emphysema, H.pylori infection ( current), emphysema   Patient came to ED around [place]  today for evaluation of chest pain and abdominal pain. She has been having generalized upper abdominal pain for three months. The pain doesn't radiate through to her back. She describes it as a pressure type pain, worse with meals. Pain is not positional or related to movement. She has associated early satiety, nausea and weight loss ( 172 pounds >> 155).  She has been seeing her PCP for the pain. Started a PPI ~ two weeks ago without improvement. Started on antibiotics 6 days ago for H.pylori. She hasn't had any improvement in her symptoms despite treatment. She doesn't take NSAIDS. No history of PUD. Her daughter has metastatic gastric cancer. Patient has not had any black stools or other overt GI bleeding. She gets heartburn but only after eating spicy foods and drinking a soda and belching helps.    The  chest pain resolved in ED and EKG was without acute changes. CTA chest / abd / pelvis showed chronic PE, moderate to severe compression fracture of T11 vertebral body ( progressed since prior study), patent celiac, SMA, IMA, no pancreas or gallbladder abnormalities,  left diverticulosis. Liver tests unremarkable except for trivial elevation in total bilirubin to 1.4. Lipase normal. CBC normal. UA unremarkable.   Regarding thoracic compression fracture, patient says this was diagnosed in January at which time she had terrible back pain. She didn't fall or have trauma to the area. She was treated at the time with bedrest and tylenol.    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 Authorizing Provider  acetaminophen (TYLENOL) 500 MG tablet Take 1,000 mg by mouth every 6 (six) hours as needed for mild pain.   Yes [provider]  ibuprofen (ADVIL) 200 MG tablet Take 400 mg by mouth every 6 (six) hours as needed for headache.   Yes [provider]  lisinopril (ZESTRIL) 20 MG tablet Take 1 tablet (20 mg total) by mouth daily. 09/09/19  Yes Andrew Au, MD  metroNIDAZOLE (FLAGYL) 250 MG tablet Take 1 tablet (250 mg total) by mouth 4 (four) times daily for 12 days. 07/11/20 07/23/20 Yes Maudie Mercury, MD  omeprazole (PRILOSEC OTC) 20 MG tablet Take 1 tablet (20 mg total) by mouth daily. 07/04/20 09/02/20 Yes Maudie Mercury, MD  pantoprazole (PROTONIX) 40 MG tablet Take 1 tablet (40 mg total) by mouth 2 (two) times daily for 12 days.  07/11/20 07/23/20 Yes Maudie Mercury, MD  pravastatin (PRAVACHOL) 10 MG tablet Take 1 tablet (10 mg total) by mouth daily. Patient taking differently: Take 10 mg by mouth in the morning and at bedtime. 09/09/19 09/08/20 Yes Andrew Au, MD  tetracycline (SUMYCIN) 500 MG capsule Take 1 capsule (500 mg total) by  mouth 4 (four) times daily for 12 days. 07/11/20 07/23/20 Yes Maudie Mercury, MD  XARELTO 20 MG TABS tablet TAKE 1 TABLET(20 MG) BY MOUTH DAILY WITH SUPPER Patient taking differently: Take 20 mg by mouth daily with supper. 04/06/20  Yes Wyatt Portela, MD  alendronate (FOSAMAX) 70 MG tablet Take 1 tablet (70 mg total) by mouth every 7 (seven) days. Take with a full glass of water on an empty stomach. Patient not taking: No sig reported 02/16/20 02/15/21  Modena Nunnery D, DO  bismuth subsalicylate (PEPTO-BISMOL) 262 MG/15ML suspension Take 30 mLs by mouth 4 (four) times daily as needed. Patient not taking: No sig reported 07/11/20 07/11/21  Maudie Mercury, MD  cholecalciferol (VITAMIN D) 25 MCG (1000 UNIT) tablet Take 1 tablet (1,000 Units total) by mouth daily. 02/17/20   [place], [name], DO  simethicone (GAS-X) 80 MG chewable tablet Chew 1 tablet (80 mg total) by mouth 4 (four) times daily as needed for flatulence. Patient not taking: No sig reported 10/14/19 10/13/20  Maudie Mercury, MD    Current Facility-Administered Medications  Medication Dose Route Frequency Provider Last Rate Last Admin   acetaminophen (TYLENOL) tablet 650 mg  650 mg Oral Q6H PRN Marianna Payment, MD       Or   acetaminophen (TYLENOL) suppository 650 mg  650 mg Rectal Q6H PRN Marianna Payment, MD       lisinopril (ZESTRIL) tablet 20 mg  20 mg Oral Daily Marianna Payment, MD   20 mg at 07/18/20 0846   metroNIDAZOLE (FLAGYL) tablet 250 mg  250 mg Oral QID Marianna Payment, MD   250 mg at 07/18/20 3893   nicotine (NICODERM CQ - dosed in mg/24 hr) patch 7 mg  7 mg Transdermal Daily Marianna Payment, MD       pantoprazole  (PROTONIX) EC tablet 40 mg  40 mg Oral BID Marianna Payment, MD   40 mg at 07/18/20 0846   polyethylene glycol (MIRALAX / GLYCOLAX) packet 17 g  17 g Oral Daily PRN Marianna Payment, MD       pravastatin (PRAVACHOL) tablet 20 mg  20 mg Oral q1800 Marianna Payment, MD       rivaroxaban Alveda Reasons) tablet 20 mg  20 mg Oral Q supper Marianna Payment, MD       sucralfate (CARAFATE) 1 GM/10ML suspension 1 g  1 g Oral Q8H PRN Marianna Payment, MD       tetracycline (SUMYCIN) capsule 500 mg  500 mg Oral QID Marianna Payment, MD        Allergies as of 07/18/2020   (No Known Allergies)    Family History  Problem Relation Age of Onset   Diabetes Mother    Heart disease Mother    Cancer Mother        Uterine   Diabetes Father    CAD Father    Heart attack Father    Hypothyroidism Sister    Melanoma Sister    Clotting disorder Sister    Cancer Sister        stomach   Diabetes Sister    Ovarian cancer Other    Uterine cancer Other    Breast cancer Other    Diabetes Son    Uterine cancer Niece    Pancreatic cancer Nephew    Diabetes Sister    Hypertension Sister    Hypothyroidism Sister    Diabetes Sister    Hypertension Sister    Hypothyroidism Sister     Social History  Socioeconomic History   Marital status: Widowed    Spouse name: Not on file   Number of children: 2   Years of education: Some college   Highest education level: Not on file  Occupational History   Occupation: Retired    Fish farm manager: [employer]  Tobacco Use   Smoking status: Former    Packs/day: 0.01    Pack years: 0.00    Types: Cigarettes    Quit date: 07/01/2019    Years since quitting: 1.0   Smokeless tobacco: Never   Tobacco comments:    1-2 per day   Substance and Sexual Activity   Alcohol use: No   Drug use: No   Sexual activity: Not on file  Other Topics Concern   Not on file  Social History Narrative   Current Social History 12/22/2019        Patient lives with 2 sons and 16 yo granddaughter in a one level  duplex. There are 4 steps with handrail up to the entrance the patient uses.       Patient's method of transportation is personal car.      The highest level of education was some college.      The patient currently retired from [employer].      Identified important Relationships are "My 82 yo granddaughter ([name]), 2 sons, 5 grands, and 3 greatgrands"      Pets : "None at this time but thinking about getting another Risk analyst / Fun:  All with [name]: Shopping, going to movies, walking, out to eat, [employer], board games, computer games      Current Stressors: 2 other granddaughters, one who just had baby, the other with health problems. Baby sister with stomach cancer.       Religious / Personal Beliefs: "Old fashioned [place]."       L. Ducatte, BSN, RN-BC          Social Determinants of Health   Financial Resource Strain: Not on file  Food Insecurity: Not on file  Transportation Needs: Not on file  Physical Activity: Not on file  Stress: Not on file  Social Connections: Not on file  Intimate Partner Violence: Not on file    Review of Systems: All systems reviewed and negative except where noted in HPI.    OBJECTIVE:    Physical Exam: Vital signs in last 24 hours: Temp:  [97.9 F (36.6 C)-98.2 F (36.8 C)] 98 F (36.7 C) (06/28 1101) Pulse Rate:  [59-80] 69 (06/28 1101) Resp:  [16-28] 16 (06/28 1101) BP: (146-191)/(68-100) 166/86 (06/28 1101) SpO2:  [89 %-97 %] 90 % (06/28 1101) Weight:  [71.7 kg] 71.7 kg (06/28 0029)   General:   Alert  female in NAD Psych:  Pleasant, cooperative. Normal mood and affect. Eyes:  Pupils equal, sclera clear, no icterus.   Conjunctiva pink. Ears:  Normal auditory acuity. Nose:  No deformity, discharge,  or lesions. Neck:  Supple; no masses Lungs:  Clear throughout to auscultation.   No wheezes, crackles, or rhonchi.  Heart:  Regular rate , no murmurs, no lower extremity edema Abdomen:  Soft,  non-distended, mild LUQ tenderness.  BS active, no palp mass   Rectal:  Deferred  Msk:  Symmetrical without gross deformities. . Neurologic:  Alert and  oriented x4;  grossly normal neurologically. Skin:  Intact without significant lesions or rashes.  Filed Weights   07/18/20 0029  Weight: 71.7 kg  Scheduled inpatient medications  lisinopril  20 mg Oral Daily   metroNIDAZOLE  250 mg Oral QID   nicotine  7 mg Transdermal Daily   pantoprazole  40 mg Oral BID   pravastatin  20 mg Oral q1800   rivaroxaban  20 mg Oral Q supper   tetracycline  500 mg Oral QID      Intake/Output from previous day: No intake/output data recorded. Intake/Output this shift: No intake/output data recorded.   Lab Results: Recent Labs    07/18/20 0040 07/18/20 0656  WBC 9.2 7.7  HGB 14.4 14.6  HCT 43.0 43.8  PLT 195 229   BMET Recent Labs    07/18/20 0040 07/18/20 0656  NA 142 138  K 3.7 4.6  CL 105 100  CO2 28 25  GLUCOSE 90 92  BUN 16 12  CREATININE 0.78 0.77  CALCIUM 9.4 8.9   LFT Recent Labs    07/18/20 0040 07/18/20 0656  PROT 6.4* 5.5*  ALBUMIN 4.0 3.4*  AST 12* 25  ALT 12 15  ALKPHOS 60 56  BILITOT 0.5 1.4*  BILIDIR 0.1  --   IBILI 0.4  --    PT/INR No results for input(s): LABPROT, INR in the last 72 hours. Hepatitis Panel No results for input(s): HEPBSAG, HCVAB, HEPAIGM, HEPBIGM in the last 72 hours.   . CBC Latest Ref Rng & Units 07/18/2020 07/18/2020 07/04/2020  WBC 4.0 - 10.5 K/uL 7.7 9.2 10.0  Hemoglobin 12.0 - 15.0 g/dL 14.6 14.4 15.5  Hematocrit 36.0 - 46.0 % 43.8 43.0 47.2(H)  Platelets 150 - 400 K/uL 229 195 218    . CMP Latest Ref Rng & Units 07/18/2020 07/18/2020 08/05/2019  Glucose 70 - 99 mg/dL 92 90 161(H)  BUN 8 - 23 mg/dL 12 16 13   Creatinine 0.44 - 1.00 mg/dL 0.77 0.78 1.13(H)  Sodium 135 - 145 mmol/L 138 142 141  Potassium 3.5 - 5.1 mmol/L 4.6 3.7 3.5  Chloride 98 - 111 mmol/L 100 105 103  CO2 22 - 32 mmol/L 25 28 29   Calcium 8.9 -  10.3 mg/dL 8.9 9.4 8.4(L)  Total Protein 6.5 - 8.1 g/dL 5.5(L) 6.4(L) -  Total Bilirubin 0.3 - 1.2 mg/dL 1.4(H) 0.5 -  Alkaline Phos 38 - 126 U/L 56 60 -  AST 15 - 41 U/L 25 12(L) -  ALT 0 - 44 U/L 15 12 -   Studies/Results: DG Chest Portable 1 View  Result Date: 07/18/2020 CLINICAL DATA:  Chest pain EXAM: PORTABLE CHEST 1 VIEW COMPARISON:  None. FINDINGS: Heart is upper limits normal in size. No confluent opacities or effusions. No acute bony abnormality. IMPRESSION: No active disease. Electronically Signed   By: Rolm Baptise M.D.   On: 07/18/2020 01:00   CT Angio Chest/Abd/Pel for Dissection W and/or Wo Contrast  Result Date: 07/18/2020 CLINICAL DATA:  Chest pain, abdominal pain EXAM: CT ANGIOGRAPHY CHEST, ABDOMEN AND PELVIS TECHNIQUE: Non-contrast CT of the chest was initially obtained. Multidetector CT imaging through the chest, abdomen and pelvis was performed using the standard protocol during bolus administration of intravenous contrast. Multiplanar reconstructed images and MIPs were obtained and reviewed to evaluate the vascular anatomy. CONTRAST:  139mL OMNIPAQUE IOHEXOL 350 MG/ML SOLN COMPARISON:  01/28/2020 FINDINGS: CTA CHEST FINDINGS Cardiovascular: Scattered aortic calcifications. No aneurysm or dissection. Heart is normal size. Again noted is occlusion of a left lower lobe segmental arterial branch compatible with pulmonary embolus. This was present on prior study and on prior study dating back to  June of 2021 compatible with chronic emboli. Mediastinum/Nodes: No mediastinal, hilar, or axillary adenopathy. Trachea and esophagus are unremarkable. Thyroid unremarkable. Lungs/Pleura: Emphysema. Linear areas of scarring in the lung bases. No effusions. Musculoskeletal: Chest wall soft tissues are unremarkable. Moderate to severe compression fracture of the T11 vertebral body, described as T12 on prior study. This has progressed since prior study. Review of the MIP images confirms the above  findings. CTA ABDOMEN AND PELVIS FINDINGS VASCULAR Aorta: Aortic atherosclerosis.  No aneurysm or dissection. Celiac: Patent SMA: Patent Renals: Patent IMA: Patent Inflow: Atherosclerosis.  No aneurysm or dissection. Veins: No obvious venous abnormality within the limitations of this arterial phase study. Review of the MIP images confirms the above findings. NON-VASCULAR Hepatobiliary: No focal hepatic abnormality. Gallbladder unremarkable. Pancreas: No focal abnormality or ductal dilatation. Spleen: No focal abnormality.  Normal size. Adrenals/Urinary Tract: Large cyst in the mid to lower pole of the right kidney. No hydronephrosis. Adrenal glands and urinary bladder unremarkable. Stomach/Bowel: Left colonic diverticulosis. No active diverticulitis. Stomach and small bowel decompressed. Lymphatic: No adenopathy Reproductive: Prior hysterectomy.  No adnexal masses. Other: No free fluid or free air. Musculoskeletal: No acute bony abnormality. Mild compression deformity at L1, stable. Review of the MIP images confirms the above findings. IMPRESSION: No evidence of aortic aneurysm or dissection. 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 Bibasilar scarring. Left colonic diverticulosis.  No active diverticulitis. Electronically Signed   By: Rolm Baptise M.D.   On: 07/18/2020 02:24    Principal Problem:   Atypical chest pain Active Problems:   Tobacco use   Paroxysmal atrial fibrillation with RVR (HCC)   Helicobacter pylori gastritis    Tye Savoy, NP-C @  07/18/2020, 12:01 PM

## 2020-07-18 NOTE — ED Notes (Signed)
MD at bedside. 

## 2020-07-18 NOTE — Progress Notes (Addendum)
Subjective:  Kimberly Walters is a 70 year old person on hospital day 0 with a medical history significant for paroxysmal atrial fibrillation with RVR on anticoagulation with Xarelto, factor V Leiden with prior hx of pulmonary embolism, and current H. Pylori gastritis treated with quadruple therapy here with epigastric and atypical chest pain.  She was evaluated and examined at her bedside this morning. She reports improvements in her epigastric pain, bloating, nausea, and reflux; however, she denies resolution of these symptoms. She denies increased feelings of hunger despite having limited oral intake since approx. 18:30 yesterday, but is willing to try increasing her oral intake. She reports intermittent dark stools over the past month. She verbalizes a strong desire to get back to her normal state of health and is open to interventions such as endoscopy to help further treat and manage her disease process.   Objective:  Vital signs in last 24 hours: Vitals:   07/18/20 0730 07/18/20 0730 07/18/20 0800 07/18/20 0831  BP:  (!) 168/68 (!) 172/90 (!) 164/83  Pulse:  64 (!) 59 70  Resp:  20 20 16   Temp:  98.1 F (36.7 C)  98.2 F (36.8 C)  TempSrc:  Oral  Oral  SpO2: 96% 96% 90% 93%  Weight:      Height:       Weight change:  No intake or output data in the 24 hours ending 07/18/20 1102  Constitutional: well-appearing and sitting in bed comfortably, in no acute distress HENT: normocephalic atraumatic Eyes: conjunctiva non-erythematous Neck: supple Cardiovascular: regular rate and rhythm, no m/r/g Pulmonary/Chest: normal work of breathing on room air Abdominal: soft, diffuse abdominal tenderness without rebound or guarding, non-distended with normoactive bowel sounds present MSK: normal bulk and tone Neurological: alert & oriented x 3 Skin: warm and dry Psych: appropriate mood and affect   Assessment/Plan:  Principal Problem:   Atypical chest pain Active Problems:   Tobacco  use   Paroxysmal atrial fibrillation with RVR (HCC)   Helicobacter pylori gastritis  Kimberly Walters is a 70 y.o. with medical history significant for paroxysmal atrial fibrillation with RVR on anticoagulation with Xarelto, factor V Leiden with prior hx of pulmonary embolism, and current H. Pylori gastritis treated with quadruple therapy who is here for epigastric and atypical chest pain.  Helicobacter pylori gastritis Atypical Chest Pain Patient's continued diffuse abdominal pain with localized areas of intensity in her epigastrum and chest are most likely consistent with diagnosed H. Pylori gastritis. Repeat EKG this morning unremarkable for ischemic changes, which makes cardiac etiology less likely. She has a significant family history of biliary disease but this is less likely as she is afebrile with unremarkable WBC count and does not have a cholestatic pattern on CMP. Reported history of dark stools raises risk for PUD and slow GI bleeding in the setting of anticoagulation. She continues to have poor oral intake due to her symptoms. GI has been consulted for potential endoscopy.  -We will continue to treat her gastritis symptoms and follow up on pending endoscopy results. -Plan to hold Xarelto 20 mg until after endoscopy. -Continue bismuth, protonix, tetracycline, flagyl  Hypertension Pt with continued BP ranging from 563-875 systolic. Pt's current discomfort and pain likely contributing to her increased pressure.  -Continue lisinopril 20 mg daily  Paroxysmal Atrial Fibrillation with RVR Sinus rhythm on EKG this morning. -Holding xarelto for now pending EGD  Diet: Heart Healthy VTE: NOAC after EGD, SCDs today IVF: None Code: Full   LOS: 0 days   Kimberly Walters,  Redmond Baseman, Medical Student 07/18/2020, 11:02 AM

## 2020-07-18 NOTE — Consult Note (Addendum)
Referring Provider:  [facility]       Primary Care Physician:  Farrel Gordon, DO Primary Gastroenterologist: Althia Forts but has new patient appointment with [place] late July            We were asked to see this patient for:     abdominal pain              Attending physician's note   I have taken a history, examined the patient and reviewed the chart. I agree with the Advanced Practitioner's note, impression and recommendations.  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 after midnight  The risks and benefits as well as alternatives of endoscopic procedure(s) have been discussed and reviewed. All questions answered. The patient agrees to proceed.   Further work-up and recommendation based on endoscopic findings   The patient was provided an opportunity to ask questions and all were answered. The patient agreed with the plan and demonstrated an understanding of the instructions.  Damaris Hippo , MD [phone]     ASSESSMENT / PLAN:   # 70 yo female with a three month history of generalized upper abdominal pain, worse with eating and associated with nausea, early satiety and weight loss. Labs essentially unremarkable. CTA showing patent celiac, SMA, IMA and no gallbladder or pancreatic abnormalities. Currently on 6th day of H.pylori quadruple treatment. She could have PUD as cause for pain.  --Will schedule for EGD. The risks and benefits of EGD with possible biopsies was discussed with the patient and they agree to proceed. Will schedule this to be done tomorrow though Dr. Silverio Decamp may decide to hold off to give more time for Eliquis washout ( last dose 7 pm last evening) --Continue quadruple tx of H.pylori --If EGD negative, consider RUQ [place] to look for cholelithiasis .   #  H.pylori infection ( + stool Ag) on 07/06/20) --Getting flagyl, tetracycline pantoprazole. Will add Bismuth.  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 Followed by Dr. Alen Blew.   # Afib on Eliquis at home   # Osteoporosis, T11 vertebral body fracture ( moderate to severe) on imaging in January.  CT scan this admission suggests progression of fracture.        HPI:                                                                                                                             Chief Complaint: upper abdominal pain   Kimberly Walters is a 70 y.o. female with a past medical history significant for hypercoagulable state ( followed by Hematology), Afib on Xarelto,  PE, HTN, emphysema, H.pylori infection ( current), emphysema   Patient came to ED around [place]  today for evaluation of chest pain and abdominal pain. She has been having generalized upper abdominal pain for three months. The pain doesn't radiate through to her back. She describes it as a pressure type pain, worse with meals. Pain is not positional or related to movement. She has associated early satiety, nausea and weight loss ( 172 pounds >> 155).  She has been seeing her PCP for the pain. Started a PPI ~ two weeks ago without improvement. Started on antibiotics 6 days ago for H.pylori. She hasn't had any improvement in her symptoms despite treatment. She doesn't take NSAIDS. No history of PUD. Her daughter has metastatic gastric cancer. Patient has not had any black stools or other overt GI bleeding. She gets heartburn but only after eating spicy foods and drinking a soda and belching helps.    The  chest pain resolved in ED and EKG was without acute changes. CTA chest / abd / pelvis showed chronic PE, moderate to severe compression fracture of T11 vertebral body ( progressed since prior study), patent celiac, SMA, IMA, no pancreas or gallbladder abnormalities,  left diverticulosis. Liver tests unremarkable except for trivial elevation in total bilirubin to 1.4. Lipase normal. CBC normal. UA unremarkable.   Regarding thoracic compression fracture, patient says this was diagnosed in January at which time she had terrible back pain. She didn't fall or have trauma to the area. She was treated at the time with bedrest and tylenol.    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 Authorizing Provider  acetaminophen (TYLENOL) 500 MG tablet Take 1,000 mg by mouth every 6 (six) hours as needed for mild pain.   Yes [provider]  ibuprofen (ADVIL) 200 MG tablet Take 400 mg by mouth every 6 (six) hours as needed for headache.   Yes [provider]  lisinopril (ZESTRIL) 20 MG tablet Take 1 tablet (20 mg total) by mouth daily. 09/09/19  Yes Andrew Au, MD  metroNIDAZOLE (FLAGYL) 250 MG tablet Take 1 tablet (250 mg total) by mouth 4 (four) times daily for 12 days. 07/11/20 07/23/20 Yes Maudie Mercury, MD  omeprazole (PRILOSEC OTC) 20 MG tablet Take 1 tablet (20 mg total) by mouth daily. 07/04/20 09/02/20 Yes Maudie Mercury, MD  pantoprazole (PROTONIX) 40 MG tablet Take 1 tablet (40 mg total) by mouth 2 (two) times daily for 12 days.  07/11/20 07/23/20 Yes Maudie Mercury, MD  pravastatin (PRAVACHOL) 10 MG tablet Take 1 tablet (10 mg total) by mouth daily. Patient taking differently: Take 10 mg by mouth in the morning and at bedtime. 09/09/19 09/08/20 Yes Andrew Au, MD  tetracycline (SUMYCIN) 500 MG capsule Take 1 capsule (500 mg total) by  mouth 4 (four) times daily for 12 days. 07/11/20 07/23/20 Yes Maudie Mercury, MD  XARELTO 20 MG TABS tablet TAKE 1 TABLET(20 MG) BY MOUTH DAILY WITH SUPPER Patient taking differently: Take 20 mg by mouth daily with supper. 04/06/20  Yes Wyatt Portela, MD  alendronate (FOSAMAX) 70 MG tablet Take 1 tablet (70 mg total) by mouth every 7 (seven) days. Take with a full glass of water on an empty stomach. Patient not taking: No sig reported 02/16/20 02/15/21  Modena Nunnery D, DO  bismuth subsalicylate (PEPTO-BISMOL) 262 MG/15ML suspension Take 30 mLs by mouth 4 (four) times daily as needed. Patient not taking: No sig reported 07/11/20 07/11/21  Maudie Mercury, MD  cholecalciferol (VITAMIN D) 25 MCG (1000 UNIT) tablet Take 1 tablet (1,000 Units total) by mouth daily. 02/17/20   [place], [name], DO  simethicone (GAS-X) 80 MG chewable tablet Chew 1 tablet (80 mg total) by mouth 4 (four) times daily as needed for flatulence. Patient not taking: No sig reported 10/14/19 10/13/20  Maudie Mercury, MD    Current Facility-Administered Medications  Medication Dose Route Frequency Provider Last Rate Last Admin   acetaminophen (TYLENOL) tablet 650 mg  650 mg Oral Q6H PRN Marianna Payment, MD       Or   acetaminophen (TYLENOL) suppository 650 mg  650 mg Rectal Q6H PRN Marianna Payment, MD       lisinopril (ZESTRIL) tablet 20 mg  20 mg Oral Daily Marianna Payment, MD   20 mg at 07/18/20 0846   metroNIDAZOLE (FLAGYL) tablet 250 mg  250 mg Oral QID Marianna Payment, MD   250 mg at 07/18/20 6606   nicotine (NICODERM CQ - dosed in mg/24 hr) patch 7 mg  7 mg Transdermal Daily Marianna Payment, MD       pantoprazole  (PROTONIX) EC tablet 40 mg  40 mg Oral BID Marianna Payment, MD   40 mg at 07/18/20 0846   polyethylene glycol (MIRALAX / GLYCOLAX) packet 17 g  17 g Oral Daily PRN Marianna Payment, MD       pravastatin (PRAVACHOL) tablet 20 mg  20 mg Oral q1800 Marianna Payment, MD       rivaroxaban Alveda Reasons) tablet 20 mg  20 mg Oral Q supper Marianna Payment, MD       sucralfate (CARAFATE) 1 GM/10ML suspension 1 g  1 g Oral Q8H PRN Marianna Payment, MD       tetracycline (SUMYCIN) capsule 500 mg  500 mg Oral QID Marianna Payment, MD        Allergies as of 07/18/2020   (No Known Allergies)    Family History  Problem Relation Age of Onset   Diabetes Mother    Heart disease Mother    Cancer Mother        Uterine   Diabetes Father    CAD Father    Heart attack Father    Hypothyroidism Sister    Melanoma Sister    Clotting disorder Sister    Cancer Sister        stomach   Diabetes Sister    Ovarian cancer Other    Uterine cancer Other    Breast cancer Other    Diabetes Son    Uterine cancer Niece    Pancreatic cancer Nephew    Diabetes Sister    Hypertension Sister    Hypothyroidism Sister    Diabetes Sister    Hypertension Sister    Hypothyroidism Sister     Social History  Socioeconomic History   Marital status: Widowed    Spouse name: Not on file   Number of children: 2   Years of education: Some college   Highest education level: Not on file  Occupational History   Occupation: Retired    Fish farm manager: [employer]  Tobacco Use   Smoking status: Former    Packs/day: 0.01    Pack years: 0.00    Types: Cigarettes    Quit date: 07/01/2019    Years since quitting: 1.0   Smokeless tobacco: Never   Tobacco comments:    1-2 per day   Substance and Sexual Activity   Alcohol use: No   Drug use: No   Sexual activity: Not on file  Other Topics Concern   Not on file  Social History Narrative   Current Social History 12/22/2019        Patient lives with 2 sons and 55 yo granddaughter in a one level  duplex. There are 4 steps with handrail up to the entrance the patient uses.       Patient's method of transportation is personal car.      The highest level of education was some college.      The patient currently retired from [employer].      Identified important Relationships are "My 82 yo granddaughter ([name]), 2 sons, 5 grands, and 3 greatgrands"      Pets : "None at this time but thinking about getting another Risk analyst / Fun:  All with [name]: Shopping, going to movies, walking, out to eat, [employer], board games, computer games      Current Stressors: 2 other granddaughters, one who just had baby, the other with health problems. Baby sister with stomach cancer.       Religious / Personal Beliefs: "Old fashioned [place]."       L. [place]atte, BSN, RN-BC          Social Determinants of Health   Financial Resource Strain: Not on file  Food Insecurity: Not on file  Transportation Needs: Not on file  Physical Activity: Not on file  Stress: Not on file  Social Connections: Not on file  Intimate Partner Violence: Not on file    Review of Systems: All systems reviewed and negative except where noted in HPI.    OBJECTIVE:    Physical Exam: Vital signs in last 24 hours: Temp:  [97.9 F (36.6 C)-98.2 F (36.8 C)] 98 F (36.7 C) (06/28 1101) Pulse Rate:  [59-80] 69 (06/28 1101) Resp:  [16-28] 16 (06/28 1101) BP: (146-191)/(68-100) 166/86 (06/28 1101) SpO2:  [89 %-97 %] 90 % (06/28 1101) Weight:  [71.7 kg] 71.7 kg (06/28 0029)   General:   Alert  female in NAD Psych:  Pleasant, cooperative. Normal mood and affect. Eyes:  Pupils equal, sclera clear, no icterus.   Conjunctiva pink. Ears:  Normal auditory acuity. Nose:  No deformity, discharge,  or lesions. Neck:  Supple; no masses Lungs:  Clear throughout to auscultation.   No wheezes, crackles, or rhonchi.  Heart:  Regular rate , no murmurs, no lower extremity edema Abdomen:  Soft,  non-distended, mild LUQ tenderness.  BS active, no palp mass   Rectal:  Deferred  Msk:  Symmetrical without gross deformities. . Neurologic:  Alert and  oriented x4;  grossly normal neurologically. Skin:  Intact without significant lesions or rashes.  Filed Weights   07/18/20 0029  Weight: 71.7 kg  Scheduled inpatient medications  lisinopril  20 mg Oral Daily   metroNIDAZOLE  250 mg Oral QID   nicotine  7 mg Transdermal Daily   pantoprazole  40 mg Oral BID   pravastatin  20 mg Oral q1800   rivaroxaban  20 mg Oral Q supper   tetracycline  500 mg Oral QID      Intake/Output from previous day: No intake/output data recorded. Intake/Output this shift: No intake/output data recorded.   Lab Results: Recent Labs    07/18/20 0040 07/18/20 0656  WBC 9.2 7.7  HGB 14.4 14.6  HCT 43.0 43.8  PLT 195 229   BMET Recent Labs    07/18/20 0040 07/18/20 0656  NA 142 138  K 3.7 4.6  CL 105 100  CO2 28 25  GLUCOSE 90 92  BUN 16 12  CREATININE 0.78 0.77  CALCIUM 9.4 8.9   LFT Recent Labs    07/18/20 0040 07/18/20 0656  PROT 6.4* 5.5*  ALBUMIN 4.0 3.4*  AST 12* 25  ALT 12 15  ALKPHOS 60 56  BILITOT 0.5 1.4*  BILIDIR 0.1  --   IBILI 0.4  --    PT/INR No results for input(s): LABPROT, INR in the last 72 hours. Hepatitis Panel No results for input(s): HEPBSAG, HCVAB, HEPAIGM, HEPBIGM in the last 72 hours.   . CBC Latest Ref Rng & Units 07/18/2020 07/18/2020 07/04/2020  WBC 4.0 - 10.5 K/uL 7.7 9.2 10.0  Hemoglobin 12.0 - 15.0 g/dL 14.6 14.4 15.5  Hematocrit 36.0 - 46.0 % 43.8 43.0 47.2(H)  Platelets 150 - 400 K/uL 229 195 218    . CMP Latest Ref Rng & Units 07/18/2020 07/18/2020 08/05/2019  Glucose 70 - 99 mg/dL 92 90 161(H)  BUN 8 - 23 mg/dL 12 16 13   Creatinine 0.44 - 1.00 mg/dL 0.77 0.78 1.13(H)  Sodium 135 - 145 mmol/L 138 142 141  Potassium 3.5 - 5.1 mmol/L 4.6 3.7 3.5  Chloride 98 - 111 mmol/L 100 105 103  CO2 22 - 32 mmol/L 25 28 29   Calcium 8.9 -  10.3 mg/dL 8.9 9.4 8.4(L)  Total Protein 6.5 - 8.1 g/dL 5.5(L) 6.4(L) -  Total Bilirubin 0.3 - 1.2 mg/dL 1.4(H) 0.5 -  Alkaline Phos 38 - 126 U/L 56 60 -  AST 15 - 41 U/L 25 12(L) -  ALT 0 - 44 U/L 15 12 -   Studies/Results: DG Chest Portable 1 View  Result Date: 07/18/2020 CLINICAL DATA:  Chest pain EXAM: PORTABLE CHEST 1 VIEW COMPARISON:  None. FINDINGS: Heart is upper limits normal in size. No confluent opacities or effusions. No acute bony abnormality. IMPRESSION: No active disease. Electronically Signed   By: Rolm Baptise M.D.   On: 07/18/2020 01:00   CT Angio Chest/Abd/Pel for Dissection W and/or Wo Contrast  Result Date: 07/18/2020 CLINICAL DATA:  Chest pain, abdominal pain EXAM: CT ANGIOGRAPHY CHEST, ABDOMEN AND PELVIS TECHNIQUE: Non-contrast CT of the chest was initially obtained. Multidetector CT imaging through the chest, abdomen and pelvis was performed using the standard protocol during bolus administration of intravenous contrast. Multiplanar reconstructed images and MIPs were obtained and reviewed to evaluate the vascular anatomy. CONTRAST:  123mL OMNIPAQUE IOHEXOL 350 MG/ML SOLN COMPARISON:  01/28/2020 FINDINGS: CTA CHEST FINDINGS Cardiovascular: Scattered aortic calcifications. No aneurysm or dissection. Heart is normal size. Again noted is occlusion of a left lower lobe segmental arterial branch compatible with pulmonary embolus. This was present on prior study and on prior study dating back to  June of 2021 compatible with chronic emboli. Mediastinum/Nodes: No mediastinal, hilar, or axillary adenopathy. Trachea and esophagus are unremarkable. Thyroid unremarkable. Lungs/Pleura: Emphysema. Linear areas of scarring in the lung bases. No effusions. Musculoskeletal: Chest wall soft tissues are unremarkable. Moderate to severe compression fracture of the T11 vertebral body, described as T12 on prior study. This has progressed since prior study. Review of the MIP images confirms the above  findings. CTA ABDOMEN AND PELVIS FINDINGS VASCULAR Aorta: Aortic atherosclerosis.  No aneurysm or dissection. Celiac: Patent SMA: Patent Renals: Patent IMA: Patent Inflow: Atherosclerosis.  No aneurysm or dissection. Veins: No obvious venous abnormality within the limitations of this arterial phase study. Review of the MIP images confirms the above findings. NON-VASCULAR Hepatobiliary: No focal hepatic abnormality. Gallbladder unremarkable. Pancreas: No focal abnormality or ductal dilatation. Spleen: No focal abnormality.  Normal size. Adrenals/Urinary Tract: Large cyst in the mid to lower pole of the right kidney. No hydronephrosis. Adrenal glands and urinary bladder unremarkable. Stomach/Bowel: Left colonic diverticulosis. No active diverticulitis. Stomach and small bowel decompressed. Lymphatic: No adenopathy Reproductive: Prior hysterectomy.  No adnexal masses. Other: No free fluid or free air. Musculoskeletal: No acute bony abnormality. Mild compression deformity at L1, stable. Review of the MIP images confirms the above findings. IMPRESSION: No evidence of aortic aneurysm or dissection. 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 Bibasilar scarring. Left colonic diverticulosis.  No active diverticulitis. Electronically Signed   By: Rolm Baptise M.D.   On: 07/18/2020 02:24    Principal Problem:   Atypical chest pain Active Problems:   Tobacco use   Paroxysmal atrial fibrillation with RVR ([facility]CC)   Helicobacter pylori gastritis    Tye Savoy, NP-C @  07/18/2020, 12:01 PM

## 2020-07-18 NOTE — ED Notes (Signed)
Pt arrived via [facility] - report given by Darrien  Pt arrived by EMS to [facility]. Reports that around 8:30pm pt started having mid sternal CP that moved to her abdomen and then her left shoulder. Pt is currently reporting 4/10 L shoulder pain. [facility] reports 12 lead unremarkable. Pt does have hx of afib and is on xarelto but was NSR on 12 lead. Pt has hx of HTN. BP of 170/100s for [facility].  Pt had a troponin done at PCP around 1 wk ago that was 7. Troponins at [facility] 12.  CT unremarkable.  PT AO and Ambulatory

## 2020-07-18 NOTE — H&P (Addendum)
Date: 07/18/2020               Patient Name:  Kimberly Walters MRN: [id number]  DOB: 1950-03-04 Age / Sex: 70 y.o., female   PCP: Farrel Gordon, DO         Medical Service: Internal Medicine Teaching Service         Attending Physician: Dr. Evette Doffing, Mallie Mussel, *    First Contact: Sanjuana Letters, DO Pager: Maryjean Morn [phone]  Second Contact: Jose Persia, MD Pager: Sharia Reeve [phone]       After Hours (After 5p/  First Contact Pager: [phone]  weekends / holidays): Second Contact Pager: [phone]   SUBJECTIVE   Chief Complaint: chest pain   History of Present Illness: Kimberly Walters is a 70 y.o. female with a pertinent PMH of COPD, HTN, MVP, Afib (on xerelto), factory V Leiden with previous PE, who presents to [facility] with acute onset chest pain.  Patient states that she had some acute onset chest pain started yesterday evening around 11 PM.  She has had a recent history of gastritis secondary to H. pylori and started on quadruple therapy.  She has been on this regimen for roughly 6 days.  She denies any improvement of her symptoms.  She has significant dyspepsia with epigastric abdominal pain and bloating that was particularly severe yesterday morning.  She states that the symptoms have limited her p.o. intake.  She admits to only eating a small amount of potatoes and milk yesterday evening.  Her abdominal pain  and bloating began to worsen yesterday evening.  Around 11 PM she went to go take her final dose of antibiotics for the evening and noticed some positional dizziness when going from a seated to standing position.  She noticed that her epigastric abdominal pain began to radiate towards her central chest, which she describes as chest pressure.  She states that this chest pressure was relieved after 10 minutes without any intervention.  She is concerned because she was having associated nausea and reflux.  She does admit that belching did improve her symptoms.  Otherwise she denies any precipitating  factors such as exertion or emotional distress.  Considering her past medical history of pulmonary embolism, the patient was concern for subsequent blood clots which precipitated her arrival to the ED.  Otherwise, patient does not have any history of MI/ACS and she has been taking her Xarelto as prescribed without missed doses.  Patient does admit improvement of abdominal pain after receiving "GI cocktail" in the ED.    Medications: Current Meds  Medication Sig   acetaminophen (TYLENOL) 500 MG tablet Take 1,000 mg by mouth every 6 (six) hours as needed for mild pain.   ibuprofen (ADVIL) 200 MG tablet Take 400 mg by mouth every 6 (six) hours as needed for headache.   lisinopril (ZESTRIL) 20 MG tablet Take 1 tablet (20 mg total) by mouth daily.   metroNIDAZOLE (FLAGYL) 250 MG tablet Take 1 tablet (250 mg total) by mouth 4 (four) times daily for 12 days.   omeprazole (PRILOSEC OTC) 20 MG tablet Take 1 tablet (20 mg total) by mouth daily.   pantoprazole (PROTONIX) 40 MG tablet Take 1 tablet (40 mg total) by mouth 2 (two) times daily for 12 days.   pravastatin (PRAVACHOL) 10 MG tablet Take 1 tablet (10 mg total) by mouth daily. (Patient taking differently: Take 10 mg by mouth in the morning and at bedtime.)   tetracycline (SUMYCIN) 500 MG capsule Take 1 capsule (  500 mg total) by mouth 4 (four) times daily for 12 days.   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 Koleen Distance  Substance use - No etoh, smokes cigarettes (0.5 packs/day since 70 y.o.), no nonprescription medication uses  Family History: Family History  Problem Relation Age of Onset   Diabetes Mother    Heart disease Mother    Cancer Mother        Uterine   Diabetes Father    CAD Father    Heart attack Father    Hypothyroidism Sister    Melanoma Sister    Clotting disorder Sister    Cancer Sister        stomach   Diabetes Sister    Ovarian cancer Other    Uterine cancer Other    Breast cancer Other    Diabetes Son    Uterine cancer Niece    Pancreatic cancer Nephew    Diabetes Sister    Hypertension Sister    Hypothyroidism Sister    Diabetes Sister    Hypertension Sister    Hypothyroidism Sister     Allergies: Allergies as of 07/18/2020   (No Known Allergies)   Past Medical History:  Diagnosis Date   Anticoagulant-induced bleeding (HCC)    Aortic atherosclerosis (HCC)    Emphysema, unspecified ([place])    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   OBJECTIVE:   Physical Exam: Blood pressure (!) 188/98, pulse 70, temperature 98 F (36.7 C), temperature source Oral, resp. rate (!) 28, height 5\' 4"  (1.626 m), weight 71.7 kg, last menstrual period 06/21/1973, SpO2 96 %. Physical Exam Constitutional:      Appearance: She is well-developed. She is not diaphoretic.  Eyes:     Extraocular Movements: Extraocular movements intact.   Cardiovascular:     Rate and Rhythm: Normal rate and regular rhythm.     Pulses: Normal pulses.     Heart sounds: Normal heart sounds.  Pulmonary:     Effort: Pulmonary effort is normal.     Breath sounds: Normal breath sounds.  Abdominal:     General: There is distension.     Palpations: There is no mass.     Tenderness: There is abdominal tenderness (epigastric). There is no guarding  or rebound.     Hernia: No hernia is present.  Musculoskeletal:        General: No swelling or tenderness. Normal range of motion.  Skin:    General: Skin is warm and dry.  Neurological:     General: No focal deficit present.     Mental Status: She is alert and oriented to person, place, and time.    Labs: CBC    Component Value Date/Time   WBC 9.2 07/18/2020 0040   RBC 4.67 07/18/2020 0040   HGB 14.4 07/18/2020 0040   HGB 15.5 07/04/2020 1104   HCT 43.0 07/18/2020 0040   HCT 47.2 (H) 07/04/2020 1104   PLT 195 07/18/2020 0040   PLT 218 07/04/2020 1104   MCV 92.1 07/18/2020 0040   MCV 92 07/04/2020 1104   MCH 30.8 07/18/2020 0040   MCHC 33.5 07/18/2020 0040   RDW 13.2 07/18/2020 0040   RDW 13.3 07/04/2020 1104   LYMPHSABS 1.8 07/04/2020 1104   MONOABS 0.7 07/30/2019 1242   EOSABS 0.1 07/04/2020 1104   BASOSABS 0.1 07/04/2020 1104     CMP     Component Value Date/Time   NA 142 07/18/2020 0040   NA 141 11/16/2018 1403   K 3.7 07/18/2020 0040   CL 105 07/18/2020 0040   CO2 28 07/18/2020 0040   GLUCOSE 90 07/18/2020 0040   BUN 16 07/18/2020 0040   BUN 17 11/16/2018 1403   CREATININE 0.78 07/18/2020 0040   CALCIUM 9.4 07/18/2020 0040   PROT 6.4 (L) 07/18/2020 0040   PROT 6.2 11/16/2018 1403   ALBUMIN 4.0 07/18/2020 0040   ALBUMIN 4.1 11/16/2018 1403   AST 12 (L) 07/18/2020 0040   ALT 12 07/18/2020 0040   ALKPHOS 60 07/18/2020 0040   BILITOT 0.5 07/18/2020 0040   BILITOT 0.3 11/16/2018 1403   GFRNONAA >60 07/18/2020 0040   GFRAA 57 (L) 08/05/2019 0930   Troponin:  12>12  Imaging: DG Chest Portable 1 View  Result Date: 07/18/2020 CLINICAL DATA:  Chest pain EXAM: PORTABLE CHEST 1 VIEW COMPARISON:  None. FINDINGS: Heart is upper limits normal in size. No confluent opacities or effusions. No acute bony abnormality. IMPRESSION: No active disease. Electronically Signed   By: Rolm Baptise M.D.   On: 07/18/2020 01:00   CT Angio Chest/Abd/Pel for Dissection W and/or Wo Contrast No evidence of aortic aneurysm or dissection. 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 Bibasilar scarring. Left colonic diverticulosis.  No active diverticulitis.   EKG: personally reviewed my interpretation is sinus rhythm   ASSESSMENT & PLAN:   Principal Problem:   Atypical chest pain Active Problems:   Tobacco use   Emphysema, unspecified (HCC)   Acute pulmonary embolism (HCC)   Paroxysmal atrial fibrillation with RVR (HCC)   Helicobacter pylori gastritis   Kimberly Walters is a 70 y.o. with pertinent PMH of COPD, HTN, MVP, Afib (on xerelto), factory V Leiden with previous PE who presented with chest pain and admit for atypical cardiac chest pain on hospital day 0  #Atypical chest Pain  Patient presents with atypical chest pain in the setting of acute gastritis from H. pylori.  Patient's EKG shows normal sinus rhythm with no evidence of ischemia.  Her troponin was negative x2.  CTA was negative for any new pulmonary embolus.  Patient states that she has been taking her Xarelto daily without missing doses.  Patient's symptoms sound consistent with acute gastritis.  Patient  does have evidence of aortic atherosclerosis without any previous history of MI/ACS.  She does have an elevated LDL 85 on pravastatin 20 mg daily and evidence of prediabetes (a1c 6.2).  Furthermore, she does have a history of tobacco use disorder with a roughly 30-pack-year history of smoking.  Although she does have risk factors for CAD, her work-up  does not show any evidence of ACS/MI currently. We will observe her overnight and treat her gastritis symptoms. -We will continue quadruple therapy (Pepto-Bismol, Protonix 40 mg twice daily, metronidazole 250 mg 4 times daily, tetracycline 500 mg 4 times daily) -We will add Carafate as needed -We will repeat EKG this morning  #Hx of PE #Factor V Leiden Patient has a history of factor V Leiden with previous PE.  Current CT angiogram does show evidence of a chronic PE.  She is taking her Xarelto daily without missing doses. -Continue Xarelto 20 mg daily  #Hypertension #HLD Patient does have a history of hypertension with systolics in the 811S here in the ED.  Patient took her lisinopril as prescribed yesterday morning.  Patient also has an elevated LDL of 84 on pravastatin 20 mg daily.  We will continue this in the inpatient setting. -Restart lisinopril 20 mg daily -Continue pravastatin 20 mg daily.  #Prediabetes: Patient has a history of prediabetes with a hemoglobin A1c of 6.2 over a year ago. -We will repeat hemoglobin A1c during his admission  Tobacco-use disorder: Patient has a history of tobacco use disorder with a roughly 30-pack-year history of smoking. -We will counsel smoking cessation -We will put in a nicotine patch  #Paroxysmal atrial fibrillation Patient has a history of paroxysmal atrial fibrillation.  Current EKG shows sinus rhythm.  She takes Xarelto 20 mg daily without missed doses.  She is not on any rate controlling medications currently. -Continue Xarelto 20 mg daily  Diet: Heart Healthy VTE: NOAC IVF: None,None Code: Full  Anticipated Discharge Location: Home Barriers to Discharge: none  Dispo: Admit patient to Observation with expected length of stay less than 2 midnights.  Signed: Lawerance Cruel, D.O.  Internal Medicine Resident, PGY-2 [facility]  Pager: [phone] 6:48 AM, 07/18/2020   Please contact the on call pager  after 5 pm and on weekends at [phone].

## 2020-07-18 NOTE — ED Triage Notes (Addendum)
Patient BIB [facility] from Home for CP.  Patient has been having ABD Pain for approximately 2 weeks for which she is being treated at Home by PCP with antibiotics for possible "Bacterial Infection". Patient states she began having Mid-Sternal CP that began tonight after eating.  Ambulatory, VSS en route with EMS. GCS 15.  324 ASA given by EMS 1 SL NTG given by EMS with No Relief.

## 2020-07-19 ENCOUNTER — Encounter (HOSPITAL_COMMUNITY): Payer: Self-pay | Admitting: Student in an Organized Health Care Education/Training Program

## 2020-07-19 ENCOUNTER — Inpatient Hospital Stay (HOSPITAL_COMMUNITY): Payer: Medicare HMO | Admitting: Certified Registered"

## 2020-07-19 ENCOUNTER — Telehealth: Payer: Self-pay

## 2020-07-19 ENCOUNTER — Encounter (HOSPITAL_COMMUNITY)
Admission: EM | Disposition: A | Discharge status: Home / Self Care | Payer: Self-pay | Source: Home / Self Care | Attending: Emergency Medicine

## 2020-07-19 DIAGNOSIS — K2990 Gastroduodenitis, unspecified, without bleeding: Secondary | ICD-10-CM | POA: Diagnosis not present

## 2020-07-19 DIAGNOSIS — K2950 Unspecified chronic gastritis without bleeding: Secondary | ICD-10-CM | POA: Diagnosis not present

## 2020-07-19 DIAGNOSIS — J449 Chronic obstructive pulmonary disease, unspecified: Secondary | ICD-10-CM | POA: Diagnosis not present

## 2020-07-19 DIAGNOSIS — Z79899 Other long term (current) drug therapy: Secondary | ICD-10-CM | POA: Diagnosis not present

## 2020-07-19 DIAGNOSIS — K2970 Gastritis, unspecified, without bleeding: Secondary | ICD-10-CM | POA: Diagnosis not present

## 2020-07-19 DIAGNOSIS — Z7901 Long term (current) use of anticoagulants: Secondary | ICD-10-CM | POA: Diagnosis not present

## 2020-07-19 DIAGNOSIS — A048 Other specified bacterial intestinal infections: Secondary | ICD-10-CM | POA: Diagnosis not present

## 2020-07-19 DIAGNOSIS — K269 Duodenal ulcer, unspecified as acute or chronic, without hemorrhage or perforation: Secondary | ICD-10-CM | POA: Diagnosis not present

## 2020-07-19 DIAGNOSIS — R0789 Other chest pain: Secondary | ICD-10-CM | POA: Diagnosis not present

## 2020-07-19 DIAGNOSIS — Z86711 Personal history of pulmonary embolism: Secondary | ICD-10-CM | POA: Diagnosis not present

## 2020-07-19 DIAGNOSIS — Z800 Family history of malignant neoplasm of digestive organs: Secondary | ICD-10-CM | POA: Diagnosis not present

## 2020-07-19 DIAGNOSIS — R1013 Epigastric pain: Secondary | ICD-10-CM | POA: Diagnosis not present

## 2020-07-19 DIAGNOSIS — I10 Essential (primary) hypertension: Secondary | ICD-10-CM | POA: Diagnosis not present

## 2020-07-19 DIAGNOSIS — G8929 Other chronic pain: Secondary | ICD-10-CM | POA: Diagnosis not present

## 2020-07-19 DIAGNOSIS — Z87891 Personal history of nicotine dependence: Secondary | ICD-10-CM | POA: Diagnosis not present

## 2020-07-19 DIAGNOSIS — I480 Paroxysmal atrial fibrillation: Secondary | ICD-10-CM | POA: Diagnosis not present

## 2020-07-19 DIAGNOSIS — Z20822 Contact with and (suspected) exposure to covid-19: Secondary | ICD-10-CM | POA: Diagnosis not present

## 2020-07-19 HISTORY — PX: BIOPSY: SHX5522

## 2020-07-19 HISTORY — PX: ESOPHAGOGASTRODUODENOSCOPY (EGD) WITH PROPOFOL: SHX5813

## 2020-07-19 LAB — CBC WITH DIFFERENTIAL/PLATELET
Abs Immature Granulocytes: 0.02 10*3/uL (ref 0.00–0.07)
Basophils Absolute: 0.1 10*3/uL (ref 0.0–0.1)
Basophils Relative: 1 %
Eosinophils Absolute: 0.2 10*3/uL (ref 0.0–0.5)
Eosinophils Relative: 3 %
HCT: 44.3 % (ref 36.0–46.0)
Hemoglobin: 14.7 g/dL (ref 12.0–15.0)
Immature Granulocytes: 0 %
Lymphocytes Relative: 21 %
Lymphs Abs: 1.5 10*3/uL (ref 0.7–4.0)
MCH: 30.7 pg (ref 26.0–34.0)
MCHC: 33.2 g/dL (ref 30.0–36.0)
MCV: 92.5 fL (ref 80.0–100.0)
Monocytes Absolute: 0.7 10*3/uL (ref 0.1–1.0)
Monocytes Relative: 9 %
Neutro Abs: 4.6 10*3/uL (ref 1.7–7.7)
Neutrophils Relative %: 66 %
Platelets: 186 10*3/uL (ref 150–400)
RBC: 4.79 MIL/uL (ref 3.87–5.11)
RDW: 12.9 % (ref 11.5–15.5)
WBC: 7.1 10*3/uL (ref 4.0–10.5)
nRBC: 0 % (ref 0.0–0.2)

## 2020-07-19 SURGERY — ESOPHAGOGASTRODUODENOSCOPY (EGD) WITH PROPOFOL
Anesthesia: Monitor Anesthesia Care

## 2020-07-19 MED ORDER — LIDOCAINE 2% (20 MG/ML) 5 ML SYRINGE
INTRAMUSCULAR | Status: DC | PRN
  Administered 2020-07-19: 100 mg via INTRAVENOUS

## 2020-07-19 MED ORDER — TETRACYCLINE HCL 500 MG PO CAPS: 500.0000 mg | ORAL_CAPSULE | Freq: Four times a day (QID) | ORAL | 0 refills | Status: DC

## 2020-07-19 MED ORDER — LACTATED RINGERS IV SOLN
INTRAVENOUS | Status: AC | PRN
  Administered 2020-07-19: 1000 mL via INTRAVENOUS

## 2020-07-19 MED ORDER — ADULT MULTIVITAMIN W/MINERALS CH: 1.0000 | ORAL_TABLET | Freq: Every day | ORAL | Status: DC

## 2020-07-19 MED ORDER — PEPTO-BISMOL 524 MG/30ML PO SUSP: 30.0000 mL | Freq: Four times a day (QID) | ORAL | 2 refills | Status: DC | PRN

## 2020-07-19 MED ORDER — SUCRALFATE 1 G PO TABS: 1.0000 g | ORAL_TABLET | Freq: Three times a day (TID) | ORAL | Status: DC

## 2020-07-19 MED ORDER — RIVAROXABAN 20 MG PO TABS: 20.0000 mg | ORAL_TABLET | Freq: Every day | ORAL | Status: DC

## 2020-07-19 MED ORDER — METRONIDAZOLE 250 MG PO TABS: 250.0000 mg | ORAL_TABLET | Freq: Four times a day (QID) | ORAL | 0 refills | Status: DC

## 2020-07-19 MED ORDER — PROPOFOL 500 MG/50ML IV EMUL
INTRAVENOUS | Status: DC | PRN
  Administered 2020-07-19: 150 ug/kg/min via INTRAVENOUS

## 2020-07-19 MED ORDER — PROPOFOL 10 MG/ML IV BOLUS
INTRAVENOUS | Status: DC | PRN
  Administered 2020-07-19: 70 mg via INTRAVENOUS

## 2020-07-19 MED ORDER — BOOST / RESOURCE BREEZE PO LIQD CUSTOM: 1.0000 | Freq: Three times a day (TID) | ORAL | Status: DC

## 2020-07-19 MED ORDER — LACTATED RINGERS IV SOLN: INTRAVENOUS | Status: DC | PRN

## 2020-07-19 SURGICAL SUPPLY — 15 items

## 2020-07-19 NOTE — Plan of Care (Signed)

## 2020-07-19 NOTE — Care Management CC44 (Signed)
Condition Code 44 Documentation Completed  Patient Details  Name: Kimberly Walters MRN: [id number] Date of Birth: [birth year]   Condition Code 44 given:  Yes Patient signature on Condition Code 44 notice:  Yes Documentation of 2 MD's agreement:  Yes Code 44 added to claim:  Yes    Zenon Mayo, RN 07/19/2020, 4:00 PM

## 2020-07-19 NOTE — Anesthesia Postprocedure Evaluation (Signed)
Anesthesia Post Note  Patient: Kimberly Walters  Procedure(s) Performed: ESOPHAGOGASTRODUODENOSCOPY (EGD) WITH PROPOFOL BIOPSY     Patient location during evaluation: Endoscopy Anesthesia Type: MAC Level of consciousness: awake and alert Pain management: pain level controlled Vital Signs Assessment: post-procedure vital signs reviewed and stable Respiratory status: spontaneous breathing, nonlabored ventilation, respiratory function stable and patient connected to nasal cannula oxygen Cardiovascular status: stable and blood pressure returned to baseline Postop Assessment: no apparent nausea or vomiting Anesthetic complications: no   No notable events documented.  Last Vitals:  Vitals:   07/19/20 1213 07/19/20 1223  BP: (!) 148/80 (!) 156/82  Pulse: 82 77  Resp: (!) 26 (!) 23  Temp:    SpO2: 95% 94%    Last Pain:  Vitals:   07/19/20 1223  TempSrc:   PainSc: 0-No pain                 Belenda Cruise P Kurtiss Wence

## 2020-07-19 NOTE — Anesthesia Preprocedure Evaluation (Signed)
Anesthesia Evaluation  Patient identified by MRN, date of birth, ID band Patient awake    Reviewed: Allergy & Precautions, NPO status , Patient's Chart, lab work & pertinent test results  Airway Mallampati: II  TM Distance: >3 FB Neck ROM: Full    Dental  (+) Teeth Intact   Pulmonary COPD, former smoker, PE   Pulmonary exam normal        Cardiovascular hypertension, Pt. on medications + dysrhythmias Atrial Fibrillation  Rhythm:Irregular Rate:Normal     Neuro/Psych negative neurological ROS  negative psych ROS   GI/Hepatic Neg liver ROS, GERD  Medicated,  Endo/Other  negative endocrine ROS  Renal/GU negative Renal ROS  negative genitourinary   Musculoskeletal negative musculoskeletal ROS (+)   Abdominal (+)  Abdomen: soft.    Peds  Hematology negative hematology ROS (+)   Anesthesia Other Findings   Reproductive/Obstetrics                             Anesthesia Physical Anesthesia Plan  ASA: 3  Anesthesia Plan: MAC   Post-op Pain Management:    Induction: Intravenous  PONV Risk Score and Plan: 2 and Propofol infusion and Treatment may vary due to age or medical condition  Airway Management Planned: Simple Face Mask, Natural Airway and Nasal Cannula  Additional Equipment: None  Intra-op Plan:   Post-operative Plan:   Informed Consent: I have reviewed the patients History and Physical, chart, labs and discussed the procedure including the risks, benefits and alternatives for the proposed anesthesia with the patient or authorized representative who has indicated his/her understanding and acceptance.     Dental advisory given  Plan Discussed with: CRNA  Anesthesia Plan Comments: (Lab Results      Component                Value               Date                      WBC                      7.1                 07/19/2020                HGB                      14.7                 07/19/2020                HCT                      44.3                07/19/2020                MCV                      92.5                07/19/2020                PLT                      186  07/19/2020           Lab Results      Component                Value               Date                      NA                       138                 07/18/2020                K                        4.6                 07/18/2020                CO2                      25                  07/18/2020                GLUCOSE                  92                  07/18/2020                BUN                      12                  07/18/2020                CREATININE               0.77                07/18/2020                CALCIUM                  8.9                 07/18/2020                GFRNONAA                 >60                 07/18/2020                GFRAA                    57 (L)              08/05/2019          )        Anesthesia Quick Evaluation

## 2020-07-19 NOTE — Progress Notes (Signed)
D/C instructions given and reviewed. Family at bedside, informed us that pt was ready for D/C.

## 2020-07-19 NOTE — Op Note (Addendum)
[facility] Patient Name: Kimberly Walters Procedure Date : 07/19/2020 MRN: [id number] Attending MD: Mauri Pole , MD Date of Birth: 1950-06-20 CSN: [id number] Age: 70 Admit Type: Inpatient Procedure:                Upper GI endoscopy Indications:              Epigastric abdominal pain, Anorexia, Nausea Providers:                Mauri Pole, MD, Nelia Shi, RN,                            Laverda Sorenson, Technician Referring MD:              Medicines:                Monitored Anesthesia Care Complications:            No immediate complications. Estimated Blood Loss:     Estimated blood loss was minimal. Procedure:                Pre-Anesthesia Assessment:                           - Prior to the procedure, a History and Physical                            was performed, and patient medications and                            allergies were reviewed. The patient's tolerance of                            previous anesthesia was also reviewed. The risks                            and benefits of the procedure and the sedation                            options and risks were discussed with the patient.                            All questions were answered, and informed consent                            was obtained. Prior Anticoagulants: The patient                            last took Eliquis (apixaban) 2 days prior to the                            procedure. ASA Grade Assessment: III - A patient                            with severe systemic disease. After reviewing the  risks and benefits, the patient was deemed in                            satisfactory condition to undergo the procedure.                           After obtaining informed consent, the endoscope was                            passed under direct vision. Throughout the                            procedure, the patient's blood pressure, pulse, and                             oxygen saturations were monitored continuously. The                            GIF-H190 ([id number]) Olympus gastroscope was                            introduced through the mouth, and advanced to the                            second part of duodenum. The upper GI endoscopy was                            accomplished without difficulty. The patient                            tolerated the procedure well. Scope In: Scope Out: Findings:      The Z-line was regular and was found 36 cm from the incisors.      A non-obstructing Schatzki ring was found in the distal esophagus.      A small hiatal hernia was present.      No other gross lesions were noted in the entire esophagus.      Patchy mild inflammation characterized by congestion (edema) and       erythema was found in the entire examined stomach. Biopsies were taken       with a cold forceps for Helicobacter pylori testing.      A few erosions without bleeding were found in the duodenal bulb.      The second portion of the duodenum was normal. Impression:               - Z-line regular, 36 cm from the incisors.                           - Non-obstructing Schatzki ring.                           - Small hiatal hernia.                           - No other gross lesions in esophagus.                           -  Gastritis. Biopsied.                           - Duodenal erosions without bleeding.                           - Normal second portion of the duodenum. Recommendation:           - Patient has a contact number available for                            emergencies. The signs and symptoms of potential                            delayed complications were discussed with the                            patient. Return to normal activities tomorrow.                            Written discharge instructions were provided to the                            patient.                           - Resume previous diet.                            - Continue present medications.                           - Pantoprazole 40mg  BID                           - Complete H.pylori treatment                           - Await pathology results.                           - Resume Eliquis (apixaban) at prior dose today.                            Refer to managing physician for further adjustment                            of therapy.                           - Follow an antireflux regimen.                           - Follow up in GI office as scheduled                           - GI will sign off, available if have any questions Procedure Code(s):        ---  Professional ---                           [id number], Esophagogastroduodenoscopy, flexible,                            transoral; with biopsy, single or multiple Diagnosis Code(s):        --- Professional ---                           K22.2, Esophageal obstruction                           K44.9, Diaphragmatic hernia without obstruction or                            gangrene                           K29.70, Gastritis, unspecified, without bleeding                           K26.9, Duodenal ulcer, unspecified as acute or                            chronic, without hemorrhage or perforation                           R10.13, Epigastric pain                           R63.0, Anorexia                           R11.0, Nausea CPT copyright 2019 American Medical Association. All rights reserved. The codes documented in this report are preliminary and upon coder review may  be revised to meet current compliance requirements. Mauri Pole, MD 07/19/2020 12:09:11 PM This report has been signed electronically. Number of Addenda: 0

## 2020-07-19 NOTE — Consult Note (Signed)
   [facility] Inpatient Consult   07/19/2020  Kimberly Walters 1950/07/07 [id number]  [facility] [ACO] Patient: [facility] Medicare    Patient screened for hospitalization in HF unit progression meeting  to assess for potential [place] Management service needs for post hospital transition.  Review of patient's medical record reveals patient's primary care provider is in an Embedded practice which has a chronic care management program.  Plan:  Continue to follow progress and disposition to assess for post hospital care management needs.  No current needs noted at this time.  For questions contact:   Natividad Brood, RN BSN [facility] Liaison  [phone] business mobile phone Toll free office [phone]  Fax number: [phone] [email or web address]

## 2020-07-19 NOTE — Interval H&P Note (Signed)
History and Physical Interval Note:  07/19/2020 11:25 AM  Kimberly Walters  has presented today for surgery, with the diagnosis of upper abdominal pain, nausea, weight loss.  The various methods of treatment have been discussed with the patient and family. After consideration of risks, benefits and other options for treatment, the patient has consented to  Procedure(s): ESOPHAGOGASTRODUODENOSCOPY (EGD) WITH PROPOFOL (N/A) as a surgical intervention.  The patient's history has been reviewed, patient examined, no change in status, stable for surgery.  I have reviewed the patient's chart and labs.  Questions were answered to the patient's satisfaction.     Dalbert Stillings

## 2020-07-19 NOTE — Telephone Encounter (Signed)
Dr Johnney Ou  called requested a hospital  f/u appt  being discharged  today appt was given 7/7 @ 2:15

## 2020-07-19 NOTE — Transfer of Care (Signed)
Immediate Anesthesia Transfer of Care Note  Patient: Kimberly Walters  Procedure(s) Performed: ESOPHAGOGASTRODUODENOSCOPY (EGD) WITH PROPOFOL BIOPSY  Patient Location: PACU  Anesthesia Type:MAC  Level of Consciousness: awake, alert  and oriented  Airway & Oxygen Therapy: Patient Spontanous Breathing  Post-op Assessment: Report given to RN and Post -op Vital signs reviewed and stable  Post vital signs: Reviewed and stable  Last Vitals:  Vitals Value Taken Time  BP    Temp    Pulse 88 07/19/20 1204  Resp 21 07/19/20 1204  SpO2 95 % 07/19/20 1204  Vitals shown include unvalidated device data.  Last Pain:  Vitals:   07/19/20 1203  TempSrc:   PainSc: Asleep         Complications: No notable events documented.

## 2020-07-19 NOTE — Care Management Obs Status (Signed)
[facility] NOTIFICATION   Patient Details  Name: Kimberly Walters MRN: [id number] Date of Birth: [birth year]   Medicare Observation Status Notification Given:  Yes    Zenon Mayo, RN 07/19/2020, 4:00 PM

## 2020-07-19 NOTE — Plan of Care (Signed)
  Problem: Education: Goal: Knowledge of General Education information will improve Description: Including pain rating scale, medication(s)/side effects and non-pharmacologic comfort measures Outcome: Progressing   Problem: Health Behavior/Discharge Planning: Goal: Ability to manage health-related needs will improve Outcome: Progressing   Problem: Clinical Measurements: Goal: Ability to maintain clinical measurements within normal limits will improve Outcome: Progressing Goal: Will remain free from infection Outcome: Progressing Goal: Diagnostic test results will improve Outcome: Progressing Goal: Respiratory complications will improve Outcome: Progressing Goal: Cardiovascular complication will be avoided Outcome: Progressing   Problem: Activity: Goal: Risk for activity intolerance will decrease Outcome: Progressing   Problem: Coping: Goal: Level of anxiety will decrease Outcome: Progressing   Problem: Elimination: Goal: Will not experience complications related to bowel motility Outcome: Progressing Goal: Will not experience complications related to urinary retention Outcome: Progressing   Problem: Pain Managment: Goal: General experience of comfort will improve Outcome: Progressing   Problem: Safety: Goal: Ability to remain free from injury will improve Outcome: Progressing   

## 2020-07-19 NOTE — Plan of Care (Signed)

## 2020-07-19 NOTE — Progress Notes (Signed)
Subjective:  Kimberly Walters is a 70 year old person on hospital day 2 here for epigastric pain and decreased tolerance of oral intake secondary to H. Pylori gastritis.  Kimberly Walters was evaluated and examined at her bedside this morning. She stated gradual but continuing improvement in her symptoms since yesterday, and she particularly notes less bloating. Yesterday evening she was able to tolerate broth and jello without exacerbation of her symptoms. We discussed her upcoming endoscopy and she confirmed speaking with Dr. Silverio Decamp about the procedure. She endorsed an understanding of the procedure's purpose, and she stated she is looking forward to being able to return home.  Additionally, Kimberly Walters received tobacco cessation counseling centered around how her current tobacco use is putting her at risk for further complications with her current gastritis. She states that she had quit smoking multiple times in the past, most recently for two years duration, and she was confident she could quit again. Medication assistance to help facilitate her tobacco cessation was offered, but she denied.  Objective:  Vital signs in last 24 hours: Vitals:   07/19/20 0012 07/19/20 0520 07/19/20 0828 07/19/20 1121  BP: 123/73 (!) 141/79 (!) 145/70 (!) 172/85  Pulse: 66 65 70 88  Resp: (!) 22 19 20 19   Temp: 97.8 F (36.6 C) 98.5 F (36.9 C) 97.9 F (36.6 C) 98.2 F (36.8 C)  TempSrc: Oral Oral Oral Oral  SpO2: 92% 90% 93% 93%  Weight:  69 kg    Height:       Weight change: -1.268 kg  Intake/Output Summary (Last 24 hours) at 07/19/2020 1149 Last data filed at 07/18/2020 2200 Gross per 24 hour  Intake 480 ml  Output --  Net 480 ml    Constitutional: well-appearing and sitting in bed comfortably, in no acute distress HENT: normocephalic atraumatic Eyes: conjunctiva non-erythematous Neck: supple Pulmonary/Chest: normal work of breathing on room air Abdominal: soft, non-tender, non-distended MSK:  normal bulk and tone Neurological: alert & oriented x 3 Skin: warm and dry Psych: appropriate mood and affect  Assessment/Plan:  Principle Problem:    Helicobacter pylori gastritis Active Problems:    Tobacco use     Hypertension  Kimberly Walters is a 70 y.o. with medical history significant for paroxysmal atrial fibrillation with RVR on anticoagulation with Xarelto, factor V Leiden with prior hx of pulmonary embolism, and current H. Pylori gastritis treated with quadruple therapy for which she is here for epigastric pain and decreased tolerance of oral intake.  Helicobacter pylori gastritis Patient is improving with gradual increase in tolerance of oral intake but remains symptomatic . She continues to have some bloating and abdominal discomfort consistent with her diagnosis of H. Pylori gastritis. Endoscopy performed today significant for small hiatal hernia, inflammation/edema/erythema throughout the stomach (biopsy taken for H. Pylori testing), and multiple duodenal bulb erosions without bleeding. These results appear consistent with working diagnosis of H. Pylori gastritis as the primary cause of her symptoms. -Follow Recommendations per GI  -Resume previous diet  -Continue present medications  -Complete quadruple therapy (bismuth, protonix, tetracycline, flagyl) -Resume Xarelto 20 mg -Pantoprazole 40 mg BID -Await pathology results -Follow antireflux regimen -Follow up with GI -Change Carafate 1 g from PRN to four times daily to protect against erosions/ulcerations.  Tobacco Use Patient was provided with tobacco cessation counseling and informed of how tobacco use can exacerbate her current illness. Patient states desire to quit but denies any assistance. -No further counseling indicated at this time.  Hypertension Pt BP has improved  since yesterday trending in the 160F systolic.  -Continue lisinopril 20 mg daily  Diet: Heart Healthy VTE: NOAC after EGD, SCDs today IVF:  None Code: Full   LOS: 1 day   Archer Asa, Medical Student 07/19/2020, 11:49 AM

## 2020-07-19 NOTE — Discharge Instructions (Addendum)
Kimberly Walters,   Thank you for allowing [place] to care for you during your hospitalization.  You initially presented with upper abdominal pain thought to be secondary to your H. pylori gastritis.  Because you are having difficulty eating and still uncomfortable, we consulted the gastroenterology doctors.  They assessed you and thought that you are a candidate for an endoscopy, the procedure where they look down your throat with a camera and took biopsies of your stomach.  You were not found to have any active bleeding from the sites.  The internal medicine clinic will follow up with you, I will reach out to them to schedule an appointment for follow-up.  We will follow-up with you on how your pain is going and how your H. pylori treatment is going and lastly the results of your biopsies.  If you have any questions please feel free to call our clinic  You will continue on your therapy for H. Pylori until July 7th. At this time, please call the clinic for a follow up appointment.   I'm glad you are feeling better!   Dr. Johnney Ou

## 2020-07-19 NOTE — Progress Notes (Signed)
Nutrition Follow-up  DOCUMENTATION CODES:   Not applicable  INTERVENTION:   -Once diet is advanced, resume:  -Boost Breeze po TID, each supplement provides 250 kcal and 9 grams of protein  -MVI with minerals daily  NUTRITION DIAGNOSIS:   Inadequate oral intake related to altered GI function as evidenced by NPO status.  GOAL:   Patient will meet greater than or equal to 90% of their needs  MONITOR:   PO intake, Supplement acceptance, Diet advancement, Labs, Weight trends, Skin, I & O's  REASON FOR ASSESSMENT:   Malnutrition Screening Tool    ASSESSMENT:   Kimberly Walters is a 70 y.o. female with a pertinent PMH of COPD, HTN, MVP, Afib (on xerelto), factory V Leiden with previous PE, who presents to [facility] with acute onset chest pain.  Pt admitted with chest pain.   Reviewed I/O's: +180 ml x 24 hours  Per MD notes, plan for EGD today. Pt currently NPO fopr procedure.   Pt unavailable at time of visit. RD unable to obtain further nutrition-related history or complete nutrition-focused physical exam at this time.    Reviewed wt hx; pt has experienced a 10% wt loss over the past 9 months, which is significant for time frame. Pt is at high risk for malnutrition, however, unable to identify at this time.  Medications reviewed.   Labs reviewed.    Diet Order:   Diet Order             Diet NPO time specified  Diet effective midnight                   EDUCATION NEEDS:   No education needs have been identified at this time  Skin:  Skin Assessment: Reviewed RN Assessment  Last BM:  07/18/20  Height:   Ht Readings from Last 1 Encounters:  07/18/20 5\' 4"  (1.626 m)    Weight:   Wt Readings from Last 1 Encounters:  07/19/20 69 kg    Ideal Body Weight:  54.5 kg  BMI:  Body mass index is 26.11 kg/m.  Estimated Nutritional Needs:   Kcal:  2050-2250  Protein:  105-120 grams  Fluid:  > 2 L    Loistine Chance, RD, LDN, [place] Registered Dietitian  II Certified Diabetes Care and Education Specialist Please refer to [facility] for RD and/or RD on-call/weekend/after hours pager

## 2020-07-20 ENCOUNTER — Encounter (HOSPITAL_COMMUNITY): Payer: Self-pay | Admitting: Gastroenterology

## 2020-07-20 ENCOUNTER — Telehealth: Payer: Self-pay

## 2020-07-20 ENCOUNTER — Other Ambulatory Visit: Payer: Self-pay | Admitting: Student

## 2020-07-20 MED ORDER — DOXYCYCLINE HYCLATE 100 MG PO CAPS: 100.0000 mg | ORAL_CAPSULE | Freq: Two times a day (BID) | ORAL | 0 refills | Status: AC

## 2020-07-20 NOTE — Telephone Encounter (Signed)
Also received drug change request from patient's pharmacy today via fax:  "Drug not covered by patient plan.  The preferred alternative is Minocycline HCL, Doxycycline Monohydrate".    Medication was prescribed by Dr. Evert Kohl 07/19/20, in-patient stay.  Will forward to Dr. Raliegh Ip, as well as attending pool for med change consideration.  Thank you, SChaplin, RN,BSN

## 2020-07-20 NOTE — Telephone Encounter (Signed)
Per patient insurance will not covered  tetracycline (SUMYCIN) 500 MG capsule. Please call pt back.

## 2020-07-20 NOTE — Progress Notes (Addendum)
Received page from Morrison Old, RN from [facility] clinic.  Patient unable to afford tetracycline.  Called Ms. Indelicato she states that she was unable to afford this since it was prescribed 8 days ago.  Will prescribe doxycycline 100 mg twice daily for 14 days starting today.  Finish date will be 15 July.

## 2020-07-20 NOTE — Telephone Encounter (Signed)
Received another call from pt stating she cannot afford $300.00 for tetracycline. Text message sent to Dr Barnet Glasgow.

## 2020-07-20 NOTE — Discharge Summary (Addendum)
Name: Kimberly Walters MRN: [id number] DOB: 1951-01-19 70 y.o. PCP: Farrel Gordon, DO  Date of Admission: 07/18/2020 12:22 AM Date of Discharge: 07/19/20 Attending Physician: Dr. Evette Doffing  Discharge Diagnosis: Principal Problem:   Atypical chest pain Active Problems:   Tobacco use   Paroxysmal atrial fibrillation with RVR (HCC)   Abdominal pain, chronic, epigastric   Helicobacter pylori gastritis   Discharge Medications: Allergies as of 07/19/2020   No Known Allergies      Medication List     STOP taking these medications    ibuprofen 200 MG tablet Commonly known as: ADVIL   omeprazole 20 MG tablet Commonly known as: PriLOSEC OTC   simethicone 80 MG chewable tablet Commonly known as: Gas-X   tetracycline 500 MG capsule Commonly known as: SUMYCIN       TAKE these medications    acetaminophen 500 MG tablet Commonly known as: TYLENOL Take 1,000 mg by mouth every 6 (six) hours as needed for mild pain.   alendronate 70 MG tablet Commonly known as: Fosamax Take 1 tablet (70 mg total) by mouth every 7 (seven) days. Take with a full glass of water on an empty stomach.   cholecalciferol 25 MCG (1000 UNIT) tablet Commonly known as: VITAMIN D Take 1 tablet (1,000 Units total) by mouth daily.   lisinopril 20 MG tablet Commonly known as: ZESTRIL Take 1 tablet (20 mg total) by mouth daily.   metroNIDAZOLE 250 MG tablet Commonly known as: Flagyl Take 1 tablet (250 mg total) by mouth 4 (four) times daily for 8 days.   pantoprazole 40 MG tablet Commonly known as: Protonix Take 1 tablet (40 mg total) by mouth 2 (two) times daily for 12 days.   Pepto-Bismol 262 MG/15ML suspension Generic drug: bismuth subsalicylate Take 30 mLs by mouth 4 (four) times daily as needed.   pravastatin 10 MG tablet Commonly known as: PRAVACHOL Take 1 tablet (10 mg total) by mouth daily. What changed: when to take this   Xarelto 20 MG Tabs tablet Generic drug: rivaroxaban TAKE 1  TABLET(20 MG) BY MOUTH DAILY WITH SUPPER What changed: See the new instructions.   Doxycycline 100mg  BID for 14 days       Disposition and follow-up:   Kimberly Walters was discharged from [facility] in Stable condition.  At the hospital follow up visit please address:  1.  Follow-up:  a.  H. pylori-follow-up medication adherence/EGD biopsy    b.  Tobacco use-follow-up cessation counseling   c.  Hypertension-repeat blood pressure   d.  Prediabetes-follow-up A1c in 3 months  2.  Labs / imaging needed at time of follow-up: Repeat A1c 3 months  3.  Pending labs/ test needing follow-up: Surgical pathology, EGD biopsy  4.  Medication Changes  Started: None  Stopped: Tetracycline  Changed: None  Abx -doxycycline 100 mg twice daily End Date: 08/04/2020  Follow-up Appointments:  Follow-up Information     Farrel Gordon, DO. Schedule an appointment as soon as possible for a visit.   Specialty: Internal Medicine Contact information: [place] [address] [place] Hospital Course by problem list:  H. Pylori Gastritis Kimberly Walters was recently is with H. pylori and started on quadruple therapy of Pepto-Bismol, Protonix, metronidazole, tetracycline with an end date for 07/27/2020.  Discharged presented the emergency department with progressive epigastric and lower chest pain.  EKG was reassuring and troponin  levels were normal, ACS was ruled out at this time.  Was not thought that this pain was cardiac in nature.  It was more so thought that this was gastritis possible peptic ulcers.  She was struggling to keep food down did not feel hungry.  Her weight was down 4 pounds in the last 3 months.  As such the decision was made to keep the patient for GI consult and further evaluation.  In EGD was performed and she had findings consistent with suspected gastritis and hiatal hernia as well as some duodenal erosions without bleeding.   It was unremarkable for PUD.  She was discharged home and continued on her quadruple therapy.  Her quadruple therapy was sent in on 07/27/2020.  Day after discharge we received a message from the nursing staff from the clinic indicating that the patient was unable to afford her tetracycline.  Doxycycline can be used as a substitute, 100 mg twice daily.  This prescription was written for the patient and she will be given 14 days of this.  She will follow-up in the [facility] clinic as well as with GI.  Pathology pending from her EGD we will follow-up during her [facility] clinic appointment  Tobacco Use Tobacco cessation counseling was discussed with the patient including the effects that this can have on the GI system.  She states that she can quit without assistance, she denied wanting assistance with his medications.  Would encourage continuation of these tobacco cessation's discussions at discharge.  Factor V Leiden  Paroxysmal Atrial Fibrillation with Rapid Ventricular Response Repeat ECG showed normal sinus rhythm. Pt continued on Xarelto 20 mg aside form temporary hold as above for EGD procedure.   Pertinent Labs, Studies, and Procedures:  CBC Latest Ref Rng & Units 07/19/2020 07/18/2020 07/18/2020  WBC 4.0 - 10.5 K/uL 7.1 7.7 9.2  Hemoglobin 12.0 - 15.0 g/dL 14.7 14.6 14.4  Hematocrit 36.0 - 46.0 % 44.3 43.8 43.0  Platelets 150 - 400 K/uL 186 229 195    CMP Latest Ref Rng & Units 07/18/2020 07/18/2020 08/05/2019  Glucose 70 - 99 mg/dL 92 90 161(H)  BUN 8 - 23 mg/dL 12 16 13   Creatinine 0.44 - 1.00 mg/dL 0.77 0.78 1.13(H)  Sodium 135 - 145 mmol/L 138 142 141  Potassium 3.5 - 5.1 mmol/L 4.6 3.7 3.5  Chloride 98 - 111 mmol/L 100 105 103  CO2 22 - 32 mmol/L 25 28 29   Calcium 8.9 - 10.3 mg/dL 8.9 9.4 8.4(L)  Total Protein 6.5 - 8.1 g/dL 5.5(L) 6.4(L) -  Total Bilirubin 0.3 - 1.2 mg/dL 1.4(H) 0.5 -  Alkaline Phos 38 - 126 U/L 56 60 -  AST 15 - 41 U/L 25 12(L) -  ALT 0 - 44 U/L 15 12 -    DG Chest  Portable 1 View  Result Date: 07/18/2020 CLINICAL DATA:  Chest pain EXAM: PORTABLE CHEST 1 VIEW COMPARISON:  None. FINDINGS: Heart is upper limits normal in size. No confluent opacities or effusions. No acute bony abnormality. IMPRESSION: No active disease. Electronically Signed   By: Rolm Baptise M.D.   On: 07/18/2020 01:00   CT Angio Chest/Abd/Pel for Dissection W and/or Wo Contrast  Result Date: 07/18/2020 CLINICAL DATA:  Chest pain, abdominal pain EXAM: CT ANGIOGRAPHY CHEST, ABDOMEN AND PELVIS TECHNIQUE: Non-contrast CT of the chest was initially obtained. Multidetector CT imaging through the chest, abdomen and pelvis was performed using the standard protocol during bolus administration of intravenous contrast. Multiplanar reconstructed images and MIPs were  obtained and reviewed to evaluate the vascular anatomy. CONTRAST:  111mL OMNIPAQUE IOHEXOL 350 MG/ML SOLN COMPARISON:  01/28/2020 FINDINGS: CTA CHEST FINDINGS Cardiovascular: Scattered aortic calcifications. No aneurysm or dissection. Heart is normal size. Again noted is occlusion of a left lower lobe segmental arterial branch compatible with pulmonary embolus. This was present on prior study and on prior study dating back to June of 2021 compatible with chronic emboli. Mediastinum/Nodes: No mediastinal, hilar, or axillary adenopathy. Trachea and esophagus are unremarkable. Thyroid unremarkable. Lungs/Pleura: Emphysema. Linear areas of scarring in the lung bases. No effusions. Musculoskeletal: Chest wall soft tissues are unremarkable. Moderate to severe compression fracture of the T11 vertebral body, described as T12 on prior study. This has progressed since prior study. Review of the MIP images confirms the above findings. CTA ABDOMEN AND PELVIS FINDINGS VASCULAR Aorta: Aortic atherosclerosis.  No aneurysm or dissection. Celiac: Patent SMA: Patent Renals: Patent IMA: Patent Inflow: Atherosclerosis.  No aneurysm or dissection. Veins: No obvious venous  abnormality within the limitations of this arterial phase study. Review of the MIP images confirms the above findings. NON-VASCULAR Hepatobiliary: No focal hepatic abnormality. Gallbladder unremarkable. Pancreas: No focal abnormality or ductal dilatation. Spleen: No focal abnormality.  Normal size. Adrenals/Urinary Tract: Large cyst in the mid to lower pole of the right kidney. No hydronephrosis. Adrenal glands and urinary bladder unremarkable. Stomach/Bowel: Left colonic diverticulosis. No active diverticulitis. Stomach and small bowel decompressed. Lymphatic: No adenopathy Reproductive: Prior hysterectomy.  No adnexal masses. Other: No free fluid or free air. Musculoskeletal: No acute bony abnormality. Mild compression deformity at L1, stable. Review of the MIP images confirms the above findings. IMPRESSION: No evidence of aortic aneurysm or dissection. 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 Bibasilar scarring. Left colonic diverticulosis.  No active diverticulitis. Electronically Signed   By: Rolm Baptise M.D.   On: 07/18/2020 02:24     Discharge Instructions: Discharge Instructions     Call MD for:  difficulty breathing, headache or visual disturbances   Complete by: As directed    Call MD for:  extreme fatigue   Complete by: As directed    Call MD for:  hives   Complete by: As directed    Call MD for:  persistant dizziness or light-headedness   Complete by: As directed    Call MD for:  persistant nausea and vomiting   Complete by: As directed    Call MD for:  redness, tenderness, or signs of infection (pain, swelling, redness, odor or green/yellow discharge around incision site)   Complete by: As directed    Call MD for:  severe uncontrolled pain   Complete by: As directed    Call MD for:  temperature >100.4   Complete by: As directed    Diet - low sodium heart healthy   Complete by: As directed    Increase activity  slowly   Complete by: As directed        Signed: Riesa Pope, MD 07/20/2020, 4:22 PM   Pager: [phone]

## 2020-07-20 NOTE — Telephone Encounter (Signed)
Received page from Morrison Old, RN from [facility] clinic.  Patient unable to afford tetracycline.  Called Ms. Husk she states that she was unable to afford this since it was prescribed 8 days ago.  Will prescribe doxycycline 100 mg twice daily for 14 days starting today.  Finish date will be 15 July.

## 2020-07-21 LAB — SURGICAL PATHOLOGY

## 2020-07-25 ENCOUNTER — Other Ambulatory Visit: Payer: Self-pay

## 2020-07-25 ENCOUNTER — Inpatient Hospital Stay: Payer: Medicare HMO | Attending: Oncology | Admitting: Oncology

## 2020-07-25 VITALS — BP 152/88 | HR 77 | Temp 97.9°F | Resp 20 | Ht 64.0 in | Wt 155.8 lb

## 2020-07-25 DIAGNOSIS — R079 Chest pain, unspecified: Secondary | ICD-10-CM | POA: Insufficient documentation

## 2020-07-25 DIAGNOSIS — Z86718 Personal history of other venous thrombosis and embolism: Secondary | ICD-10-CM | POA: Insufficient documentation

## 2020-07-25 DIAGNOSIS — I2694 Multiple subsegmental pulmonary emboli without acute cor pulmonale: Secondary | ICD-10-CM | POA: Diagnosis not present

## 2020-07-25 DIAGNOSIS — Z7901 Long term (current) use of anticoagulants: Secondary | ICD-10-CM | POA: Insufficient documentation

## 2020-07-25 DIAGNOSIS — D6851 Activated protein C resistance: Secondary | ICD-10-CM | POA: Insufficient documentation

## 2020-07-25 NOTE — Progress Notes (Signed)
Hematology and Oncology Follow Up Visit  Kimberly Walters [id number] 10/06/1950 70 y.o. 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  She developed deep vein thrombosis and subsequently pulmonary embolism that appears to be unprovoked.  She was found to have heterogeneous factor V Leiden mutation.   Prior Therapy:  She was treated with Eliquis initially but developed PE in June 2021 while she was on Eliquis suggesting possible breakthrough thrombosis.  Lovenox 80 mg twice a day started in June 2021.  Therapy switched to Xarelto in November 2021.  Current therapy:  Xarelto 20 mg daily started in November 2021.   Interim History: [name] returns today for a follow-up visit.  Since the last visit, she was hospitalized briefly in June 2022 for chest pain which has resolved at this time.  She denies any shortness of breath or difficulty breathing.  She denies any recent thrombosis or clotting episodes.  She denies any hematochezia, melena or hemoptysis.  She continues to be independent and attends to activities of daily living.    Medications: Updated on review. Current Outpatient Medications  Medication Sig Dispense Refill   acetaminophen (TYLENOL) 500 MG tablet Take 1,000 mg by mouth every 6 (six) hours as needed for mild pain.     alendronate (FOSAMAX) 70 MG tablet Take 1 tablet (70 mg total) by mouth every 7 (seven) days. Take with a full glass of water on an empty stomach. 4 tablet 11   bismuth subsalicylate (PEPTO-BISMOL) 262 MG/15ML suspension Take 30 mLs by mouth 4 (four) times daily as needed. 240 mL 2   cholecalciferol (VITAMIN D) 25 MCG (1000 UNIT) tablet Take 1 tablet (1,000 Units total) by mouth daily. 30 tablet 2   doxycycline (VIBRAMYCIN) 100 MG capsule Take 1 capsule (100 mg total) by mouth 2 (two) times daily for 14 days. 28 capsule 0   lisinopril (ZESTRIL) 20  MG tablet Take 1 tablet (20 mg total) by mouth daily. 90 tablet 3   metroNIDAZOLE (FLAGYL) 250 MG tablet Take 1 tablet (250 mg total) by mouth 4 (four) times daily for 8 days. 32 tablet 0   pantoprazole (PROTONIX) 40 MG tablet Take 1 tablet (40 mg total) by mouth 2 (two) times daily for 12 days. 24 tablet 0   pravastatin (PRAVACHOL) 10 MG tablet Take 1 tablet (10 mg total) by mouth daily. 90 tablet 3   XARELTO 20 MG TABS tablet TAKE 1 TABLET(20 MG) BY MOUTH DAILY WITH SUPPER 30 tablet 3   No current facility-administered medications for this visit.     Allergies: No Known Allergies    Physical Exam:  Blood pressure (!) 152/88, pulse 77, temperature 97.9 F (36.6 C), temperature source Oral, resp. rate 20, height 5\' 4"  (1.626 m), weight 155 lb 12.8 oz (70.7 kg), last menstrual period 06/21/1973, SpO2 95 %.    ECOG: 1    General appearance: Comfortable appearing without any discomfort Head: Normocephalic without any trauma Oropharynx: Mucous membranes are moist and pink without any thrush or ulcers. Eyes: Pupils are equal and round reactive to light. Lymph nodes: No cervical, supraclavicular, inguinal or axillary lymphadenopathy.   Heart:regular rate and rhythm.  S1 and S2 without leg edema. Lung: Clear without any rhonchi or wheezes.  No dullness to percussion. Abdomin: Soft, nontender, nondistended with good bowel sounds.  No hepatosplenomegaly. Musculoskeletal: No joint deformity or effusion.  Full range of motion noted. Neurological: No  deficits noted on motor, sensory and deep tendon reflex exam. Skin: No petechial rash or dryness.  Appeared moist.      Lab Results: Lab Results  Component Value Date   WBC 7.1 07/19/2020   HGB 14.7 07/19/2020   HCT 44.3 07/19/2020   MCV 92.5 07/19/2020   PLT 186 07/19/2020     Chemistry      Component Value Date/Time   NA 138 07/18/2020 0656   NA 141 11/16/2018 1403   K 4.6 07/18/2020 0656   CL 100 07/18/2020 0656   CO2 25  07/18/2020 0656   BUN 12 07/18/2020 0656   BUN 17 11/16/2018 1403   CREATININE 0.77 07/18/2020 0656      Component Value Date/Time   CALCIUM 8.9 07/18/2020 0656   ALKPHOS 56 07/18/2020 0656   AST 25 07/18/2020 0656   ALT 15 07/18/2020 0656   BILITOT 1.4 (H) 07/18/2020 0656   BILITOT 0.3 11/16/2018 1403       Impression and Plan:  71 year old woman with:   1.    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  This is in the setting of a heterozygous factor V Leiden mutation and possible lupus anticoagulant.  She is currently on Xarelto which I recommended continuing indefinitely given her history of thromboembolism and her hypercoagulable panel.  The implication of a heterozygous factor V Leiden mutation was discussed at this time which is associated with slight increase of venous thromboembolism.  Lupus anticoagulant positivity is more questionable could be a false positive.  The clinical implications were discussed at this time and I recommended lifetime anticoagulation.   2.    Chest pain: Does not appear to be cardiac in etiology.  She is undergoing GI work-up at this time including ruling out gallbladder disease.    3.  Follow-up: In 8 months for repeat follow-up.     30  minutes were dedicated to this encounter.  The time was spent on reviewing laboratory data, disease status update, implication for treatment and future plan of care discussion.   Zola Button, MD 7/5/20229:19 AM

## 2020-07-27 ENCOUNTER — Encounter: Payer: Self-pay | Admitting: Student

## 2020-07-27 ENCOUNTER — Ambulatory Visit (INDEPENDENT_AMBULATORY_CARE_PROVIDER_SITE_OTHER): Payer: Medicare HMO | Admitting: Student

## 2020-07-27 ENCOUNTER — Other Ambulatory Visit: Payer: Self-pay

## 2020-07-27 DIAGNOSIS — B9681 Helicobacter pylori [H. pylori] as the cause of diseases classified elsewhere: Secondary | ICD-10-CM

## 2020-07-27 DIAGNOSIS — K2970 Gastritis, unspecified, without bleeding: Secondary | ICD-10-CM | POA: Diagnosis not present

## 2020-07-27 DIAGNOSIS — Z720 Tobacco use: Secondary | ICD-10-CM | POA: Diagnosis not present

## 2020-07-27 DIAGNOSIS — I10 Essential (primary) hypertension: Secondary | ICD-10-CM

## 2020-07-27 MED ORDER — METRONIDAZOLE 250 MG PO TABS: 250.0000 mg | ORAL_TABLET | Freq: Four times a day (QID) | ORAL | 0 refills | Status: AC

## 2020-07-27 MED ORDER — PANTOPRAZOLE SODIUM 40 MG PO TBEC: 40.0000 mg | DELAYED_RELEASE_TABLET | Freq: Two times a day (BID) | ORAL | 0 refills | Status: DC

## 2020-07-27 NOTE — Assessment & Plan Note (Signed)
Assessment: Discussed tobacco cessation. Patient states she would like to quit on her own without our assistance, if she is unable to quit by 08/2020, she will then reach out to the clinic for assistance.   Plan: -continue with cessation counseling.

## 2020-07-27 NOTE — Patient Instructions (Addendum)
Thank you, Ms.[name] for allowing [place] to provide your care today. Today we discussed   H Pylori Infection  Please continue treatment with all 4 medications until  07/15. Please follow up with GI and call our clinic to schedule an appointment at the end of the month to be seen by Dr. Marlou Sa  Tobacco Use Please continue to work on stopping smoking as it will worsen your GI symptoms. I am proud of the progress you have made thus far!  I have ordered the following labs for you:  Lab Orders  No laboratory test(s) ordered today     Referrals ordered today:   Referral Orders  No referral(s) requested today     I have ordered the following medication/changed the following medications:   Stop the following medications: Medications Discontinued During This Encounter  Medication Reason   pantoprazole (PROTONIX) 40 MG tablet Reorder   metroNIDAZOLE (FLAGYL) 250 MG tablet Reorder     Start the following medications: Meds ordered this encounter  Medications   metroNIDAZOLE (FLAGYL) 250 MG tablet    Sig: Take 1 tablet (250 mg total) by mouth 4 (four) times daily for 8 days.    Dispense:  32 tablet    Refill:  0   pantoprazole (PROTONIX) 40 MG tablet    Sig: Take 1 tablet (40 mg total) by mouth 2 (two) times daily for 8 days.    Dispense:  16 tablet    Refill:  0     Follow up: 1 Month  Should you have any questions or concerns please call the internal medicine clinic at [phone].     Sanjuana Letters, D.O. [facility]

## 2020-07-27 NOTE — Progress Notes (Signed)
CC: H. Pylori Infection  HPI:  [name] is a 70 y.o. female with a past medical history stated below and presents today for hospital follow up concerning her h. Pylori infection. Please see problem based assessment and plan for additional details.  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     alendronate (FOSAMAX) 70 MG tablet Take 1 tablet (70 mg total) by mouth every 7 (seven) days. Take with a full glass of water on an empty stomach. 4 tablet 11   bismuth subsalicylate (PEPTO-BISMOL) 262 MG/15ML suspension Take 30 mLs by mouth 4 (four) times daily as needed. 240 mL 2   cholecalciferol (VITAMIN D) 25 MCG (1000 UNIT) tablet Take 1 tablet (1,000 Units total) by mouth daily. 30 tablet 2   doxycycline (VIBRAMYCIN) 100 MG capsule Take 1 capsule (100 mg total) by mouth 2 (two) times daily for 14 days. 28 capsule 0   lisinopril (ZESTRIL) 20 MG tablet Take 1 tablet (20 mg total) by mouth daily. 90 tablet 3   pravastatin (PRAVACHOL) 10 MG tablet Take 1 tablet (10 mg total) by mouth daily. 90 tablet 3   XARELTO 20 MG TABS tablet TAKE 1 TABLET(20 MG) BY MOUTH DAILY WITH SUPPER 30 tablet 3   No current facility-administered medications on file prior to visit.    Family History  Problem Relation Age of Onset   Diabetes Mother    Heart disease Mother    Cancer Mother         Uterine   Diabetes Father    CAD Father    Heart attack Father    Hypothyroidism Sister    Melanoma Sister    Clotting disorder Sister    Cancer Sister        stomach   Diabetes Sister    Ovarian cancer Other    Uterine cancer Other    Breast cancer Other    Diabetes Son    Uterine cancer Niece    Pancreatic cancer Nephew    Diabetes Sister    Hypertension Sister    Hypothyroidism Sister    Diabetes Sister    Hypertension Sister    Hypothyroidism Sister     Social History   Socioeconomic History   Marital status: Widowed    Spouse name: Not on file   Number of children: 2   Years of education: Some college   Highest education level: Not on file  Occupational History   Occupation: Retired    Fish farm manager: [employer]  Tobacco Use   Smoking status: Former    Packs/day: 0.01    Pack years: 0.00    Types: Cigarettes    Quit date: 07/01/2019    Years since quitting: 1.0   Smokeless tobacco: Never   Tobacco comments:    1-2 per day  Substance and Sexual Activity   Alcohol use: No   Drug use: No   Sexual activity: Not on file  Other Topics Concern   Not on file  Social History Narrative   Current Social History 12/22/2019        Patient lives with 2 sons and 31 yo granddaughter in a one level duplex. There are 4 steps with handrail up to the entrance the patient uses.       Patient's method of transportation is personal car.      The highest level of education was some college.      The patient currently retired from [employer].      Identified important Relationships are "My 41 yo granddaughter ([name]), 2 sons, 5 grands, and 3 greatgrands"      Pets : "None at this time but thinking about getting another Risk analyst / Fun:  All with [name]: Shopping, going to movies, walking, out to eat, [employer], board games, computer games      Current Stressors: 2 other granddaughters, one who just had baby, the other with health problems. Baby sister with stomach  cancer.       Religious / Personal Beliefs: "Old fashioned [place]."       L. [place], BSN, RN-BC          Social Determinants of Health   Financial Resource Strain: Not on file  Food Insecurity: Not on file  Transportation Needs: Not on file  Physical Activity: Not on file  Stress: Not on file  Social Connections: Not on file  Intimate Partner Violence: Not on file   Review of Systems: ROS negative except for what is noted on the assessment and plan.  Vitals:   07/27/20 1347  BP: 123/62  Pulse: 85  Temp: 98.4 F (36.9 C)  SpO2: 97%  Weight: 157 lb 14.4 oz (71.6 kg)  Height: 5\' 4"  (1.626 m)   Physical Exam: Constitutional: well appearing, in no acute distress HENT: normocephalic atraumatic Eyes: conjunctiva non-erythematous Neck: supple Cardiovascular: regular rate and rhythm, no m/r/g Pulmonary/Chest: normal work of breathing on room air, lungs clear to auscultation bilaterally Abdominal: soft, non-tender, non-distended MSK: normal bulk and tone Neurological: alert & oriented x 3 Skin: warm and dry Psych: normal mood and thought process   Assessment & Plan:   See Encounters Tab for problem based charting.  Patient discussed with Dr. Delano Metz, D.O. [facility], PGY-2 Pager: [phone], Phone: [phone] Date 07/27/2020 Time 8:03 PM

## 2020-07-27 NOTE — Assessment & Plan Note (Signed)
Assessment: Patient with recent admission for abdominal pain and decreased PO intake secondary to the pain. During her admission an EGD was performed and found to have suspected gastritis and hiatal hernia as well as some duodenal erosions without bleeding. She was scheduled to complete her quadruple therapy treatment 07/27/20. However, after her hospitalization she called clinic to inform us she was unable to afford her tetracycline. A prescription for doxycycline was started, set to end on 08/04/20. As such, she was instructed to continue protonix, metronidazole, and pepto bismol until the 15th as well.   She states she has been able to tolerate more food/fluids such as milk without abdominal pain or acid reflux symptoms. She will follow up with her PCP at the end of the month and be tested for eradication of H. Pylori  Plan: - continue quadruple therapy until 08/04/20. - follow up with PCP two weeks after, off of PPI, for h pylori testing.

## 2020-07-27 NOTE — Assessment & Plan Note (Signed)
Assessment: BP stable at 123/62 on lisinopril 20 mg.   Plan: - continue lisinopril 20 mg daily

## 2020-08-01 ENCOUNTER — Other Ambulatory Visit: Payer: Self-pay | Admitting: Student

## 2020-08-02 ENCOUNTER — Telehealth: Payer: Self-pay

## 2020-08-02 DIAGNOSIS — Z1231 Encounter for screening mammogram for malignant neoplasm of breast: Secondary | ICD-10-CM

## 2020-08-02 NOTE — Telephone Encounter (Signed)
Spoke to patient about being scheduled to have mammogram done on [facility]. Patient stated she would prefer to be scheduled in September because she will be out of town in August.

## 2020-08-07 NOTE — Progress Notes (Signed)
Internal Medicine Clinic Attending  Case discussed with Dr. Katsadouros  At the time of the visit.  We reviewed the resident's history and exam and pertinent patient test results.  I agree with the assessment, diagnosis, and plan of care documented in the resident's note.  

## 2020-08-08 ENCOUNTER — Other Ambulatory Visit: Payer: Self-pay | Admitting: Oncology

## 2020-08-17 ENCOUNTER — Ambulatory Visit: Payer: Medicare HMO | Admitting: Gastroenterology

## 2020-08-17 ENCOUNTER — Encounter: Payer: Self-pay | Admitting: Gastroenterology

## 2020-08-17 VITALS — BP 120/60 | HR 81 | Ht 64.0 in | Wt 152.0 lb

## 2020-08-17 DIAGNOSIS — R634 Abnormal weight loss: Secondary | ICD-10-CM | POA: Diagnosis not present

## 2020-08-17 DIAGNOSIS — R1013 Epigastric pain: Secondary | ICD-10-CM | POA: Diagnosis not present

## 2020-08-17 DIAGNOSIS — R140 Abdominal distension (gaseous): Secondary | ICD-10-CM

## 2020-08-17 DIAGNOSIS — R6881 Early satiety: Secondary | ICD-10-CM | POA: Diagnosis not present

## 2020-08-17 NOTE — Progress Notes (Signed)
[facility] Consult Note:  History: Kimberly Walters 08/17/2020  Referring provider: Farrel Gordon, DO  Reason for consult/chief complaint: Abdominal Pain (Epigastric pain )   Subjective  HPI: Kimberly Walters was referred to [place] by her primary care provider in mid June and scheduled for an appointment to see me, but before that appointment she was hospitalized on June 28 with chest pain.  She had recently been given quadruple therapy for a positive outpatient H. pylori stool antigen. Dr. Woodward Ku inpatient consult note was reviewed describing a few months of upper abdominal pain worse after eating with associated nausea early satiety and weight loss.  CT angiogram showed no stenosis of celiac, SMA, IMA, gallbladder or pancreatic abnormalities.  At the time of that consultation, the patient was reportedly on her sixth day of H. pylori treatment. Her chest pain reportedly resolved in the ED and EKG had no acute changes. EGD 07/19/2020 by Dr. Silverio Decamp reports a small hiatal hernia, widely patent Schatzki ring, and mild patchy gastric erythema biopsied.  Kimberly Walters tells me that her abdominal pain early satiety nausea with resultant weight loss started several months ago, but she cannot recall any clear triggers.  Symptoms are somewhat improved in the last couple of weeks, and she finished the H. pylori treatment about a week ago.  She is supposed to see primary care to get follow-up testing to confirm eradication.  Symptoms still present, but she thinks her weight has probably stabilized lately.  Last colonoscopy many years ago (no reports in the [facility] health system), no rectal bleeding.   ROS:  Review of Systems  Constitutional:  Positive for fatigue. Negative for appetite change and unexpected weight change.  HENT:  Negative for mouth sores and voice change.   Eyes:  Negative for pain and redness.  Respiratory:  Negative for cough and shortness of breath.   Cardiovascular:  Negative for  chest pain and palpitations.  Genitourinary:  Negative for dysuria and hematuria.  Musculoskeletal:  Negative for arthralgias and myalgias.  Skin:  Negative for pallor and rash.  Neurological:  Negative for weakness and headaches.  Hematological:  Negative for adenopathy.    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    Gross hematuria    Hypertension    Mitral valve prolapse    Paroxysmal atrial fibrillation with RVR (HCC)    Pleural effusion on left    Post-thrombotic syndrome of left lower extremity    Primary hypercoagulable state ([place])    Pulmonary emboli (HCC)    Pyelonephritis    Chronic pulmonary embolism on [place] (see CTA report below) Shad[facility] (Hematology) visit 07/25/20  - factor V leiden mutation  Past Surgical History: Past Surgical History:  Procedure Laterality Date   BIOPSY  07/19/2020   Procedure: BIOPSY;  Surgeon: Mauri Pole, MD;  Location: [facility];  Service: Endoscopy;;   ESOPHAGOGASTRODUODENOSCOPY (EGD) WITH PROPOFOL N/A 07/19/2020   Procedure: ESOPHAGOGASTRODUODENOSCOPY (EGD) WITH PROPOFOL;  Surgeon: Mauri Pole, MD;  Location: [facility];  Service: Endoscopy;  Laterality: N/A;   HYSTERECTOMY ABDOMINAL WITH SALPINGO-OOPHORECTOMY  1975   LYMPH NODE BIOPSY Right 1983   Neck     Family History: Family History  Problem Relation Age of Onset   Diabetes Mother    Heart disease Mother    Cancer Mother  Uterine   Diabetes Father    CAD Father    Heart attack Father    Hypothyroidism Sister    Melanoma Sister    Clotting disorder Sister    Cancer Sister        stomach   Diabetes Sister    Diabetes Sister    Hypertension Sister    Hypothyroidism Sister    Diabetes Sister    Hypertension Sister    Hypothyroidism Sister    Diabetes Son    Pancreatic cancer Nephew     Uterine cancer Niece    Ovarian cancer Other    Uterine cancer Other    Breast cancer Other    Colon cancer Neg Hx    Esophageal cancer Neg Hx     Social History: Social History   Socioeconomic History   Marital status: Widowed    Spouse name: Not on file   Number of children: 2   Years of education: Some college   Highest education level: Not on file  Occupational History   Occupation: Retired    Fish farm manager: [employer]  Tobacco Use   Smoking status: Every Day    Packs/day: 0.01    Types: Cigarettes    Last attempt to quit: 07/01/2019    Years since quitting: 1.1   Smokeless tobacco: Never   Tobacco comments:    1-2 per day   Vaping Use   Vaping Use: Never used  Substance and Sexual Activity   Alcohol use: No   Drug use: No   Sexual activity: Not on file  Other Topics Concern   Not on file  Social History Narrative   Current Social History 12/22/2019        Patient lives with 2 sons and 35 yo granddaughter in a one level duplex. There are 4 steps with handrail up to the entrance the patient uses.       Patient's method of transportation is personal car.      The highest level of education was some college.      The patient currently retired from [employer].      Identified important Relationships are "My 68 yo granddaughter (Kimberly Walters), 2 sons, 5 grands, and 3 greatgrands"      Pets : "None at this time but thinking about getting another Risk analyst / Fun:  All with Kimberly Walters: Shopping, going to movies, walking, out to eat, [employer], board games, computer games      Current Stressors: 2 other granddaughters, one who just had baby, the other with health problems. Baby sister with stomach cancer.       Religious / Personal Beliefs: "Old fashioned [place]."       L. Ducatte, BSN, RN-BC          Social Determinants of Health   Financial Resource Strain: Not on file  Food Insecurity: Not on file  Transportation Needs: Not on file  Physical Activity:  Not on file  Stress: Not on file  Social Connections: Not on file    Trying to quit smoking  Allergies: No Known Allergies  Outpatient Meds: Current Outpatient Medications  Medication Sig Dispense Refill   acetaminophen (TYLENOL) 500 MG tablet Take 1,000 mg by mouth every 6 (six) hours as needed for mild pain.     alendronate (FOSAMAX) 70 MG tablet Take 1 tablet (70 mg total) by mouth every 7 (seven) days. Take with a full glass of water on an empty stomach. 4  tablet 11   bismuth subsalicylate (PEPTO-BISMOL) 262 MG/15ML suspension Take 30 mLs by mouth 4 (four) times daily as needed. 240 mL 2   cholecalciferol (VITAMIN D) 25 MCG (1000 UNIT) tablet Take 1 tablet (1,000 Units total) by mouth daily. 30 tablet 2   lisinopril (ZESTRIL) 20 MG tablet Take 1 tablet (20 mg total) by mouth daily. 90 tablet 3   pravastatin (PRAVACHOL) 10 MG tablet Take 1 tablet (10 mg total) by mouth daily. 90 tablet 3   XARELTO 20 MG TABS tablet TAKE 1 TABLET(20 MG) BY MOUTH DAILY WITH SUPPER 30 tablet 3   No current facility-administered medications for this visit.      ___________________________________________________________________ Objective   Exam:  BP 120/60   Pulse 81   Ht '5\' 4"'$  (1.626 m)   Wt 152 lb (68.9 kg)   LMP 06/21/1973   BMI 26.09 kg/m  Wt Readings from Last 3 Encounters:  08/17/20 152 lb (68.9 kg)  07/27/20 157 lb 14.4 oz (71.6 kg)  07/25/20 155 lb 12.8 oz (70.7 kg)    General: Not acutely ill-appearing, no muscle wasting. Eyes: sclera anicteric, no redness ENT: oral mucosa moist without lesions, no cervical or supraclavicular lymphadenopathy CV: RRR without murmur, S1/S2, no JVD, no peripheral edema Resp: clear to auscultation bilaterally, normal RR and effort noted GI: soft, mild bandlike upper abdominal tenderness to light palpation of abdominal wall, with active bowel sounds. No guarding or palpable organomegaly noted. Skin; warm and dry, no rash or jaundice noted Neuro:  awake, alert and oriented x 3. Normal gross motor function and fluent speech  Labs:  CBC Latest Ref Rng & Units 07/19/2020 07/18/2020 07/18/2020  WBC 4.0 - 10.5 K/uL 7.1 7.7 9.2  Hemoglobin 12.0 - 15.0 g/dL 14.7 14.6 14.4  Hematocrit 36.0 - 46.0 % 44.3 43.8 43.0  Platelets 150 - 400 K/uL 186 229 195   CMP Latest Ref Rng & Units 07/18/2020 07/18/2020 08/05/2019  Glucose 70 - 99 mg/dL 92 90 161(H)  BUN 8 - 23 mg/dL '12 16 13  '$ Creatinine 0.44 - 1.00 mg/dL 0.77 0.78 1.13(H)  Sodium 135 - 145 mmol/L 138 142 141  Potassium 3.5 - 5.1 mmol/L 4.6 3.7 3.5  Chloride 98 - 111 mmol/L 100 105 103  CO2 22 - 32 mmol/L '25 28 29  '$ Calcium 8.9 - 10.3 mg/dL 8.9 9.4 8.4(L)  Total Protein 6.5 - 8.1 g/dL 5.5(L) 6.4(L) -  Total Bilirubin 0.3 - 1.2 mg/dL 1.4(H) 0.5 -  Alkaline Phos 38 - 126 U/L 56 60 -  AST 15 - 41 U/L 25 12(L) -  ALT 0 - 44 U/L 15 12 -   Albumin 4.0 on 07/18/20   Gastric biopsy results:  A. STOMACH, ANTRUM AND BODY, BIOPSY:  - Mildly active chronic gastritis.  - Warthin-Starry negative for Helicobacter pylori.  - No intestinal metaplasia, dysplasia or carcinoma   Radiology results:  CLINICAL DATA:  Chest pain, abdominal pain   EXAM: CT ANGIOGRAPHY CHEST, ABDOMEN AND PELVIS   TECHNIQUE: Non-contrast CT of the chest was initially obtained.   Multidetector CT imaging through the chest, abdomen and pelvis was performed using the standard protocol during bolus administration of intravenous contrast. Multiplanar reconstructed images and MIPs were obtained and reviewed to evaluate the vascular anatomy.   CONTRAST:  167m OMNIPAQUE IOHEXOL 350 MG/ML SOLN   COMPARISON:  01/28/2020   FINDINGS: CTA CHEST FINDINGS   Cardiovascular: Scattered aortic calcifications. No aneurysm or dissection. Heart is normal size. Again noted is occlusion  of a left lower lobe segmental arterial branch compatible with pulmonary embolus. This was present on prior study and on prior study dating back to  June of 2021 compatible with chronic emboli.   Mediastinum/Nodes: No mediastinal, hilar, or axillary adenopathy. Trachea and esophagus are unremarkable. Thyroid unremarkable.   Lungs/Pleura: Emphysema. Linear areas of scarring in the lung bases. No effusions.   Musculoskeletal: Chest wall soft tissues are unremarkable. Moderate to severe compression fracture of the T11 vertebral body, described as T12 on prior study. This has progressed since prior study.   Review of the MIP images confirms the above findings.   CTA ABDOMEN AND PELVIS FINDINGS   VASCULAR   Aorta: Aortic atherosclerosis.  No aneurysm or dissection.   Celiac: Patent   SMA: Patent   Renals: Patent   IMA: Patent   Inflow: Atherosclerosis.  No aneurysm or dissection.   Veins: No obvious venous abnormality within the limitations of this arterial phase study.   Review of the MIP images confirms the above findings.   NON-VASCULAR   Hepatobiliary: No focal hepatic abnormality. Gallbladder unremarkable.   Pancreas: No focal abnormality or ductal dilatation.   Spleen: No focal abnormality.  Normal size.   Adrenals/Urinary Tract: Large cyst in the mid to lower pole of the right kidney. No hydronephrosis. Adrenal glands and urinary bladder unremarkable.   Stomach/Bowel: Left colonic diverticulosis. No active diverticulitis. Stomach and small bowel decompressed.   Lymphatic: No adenopathy   Reproductive: Prior hysterectomy.  No adnexal masses.   Other: No free fluid or free air.   Musculoskeletal: No acute bony abnormality. Mild compression deformity at L1, stable.   Review of the MIP images confirms the above findings.   IMPRESSION: No evidence of aortic aneurysm or dissection.   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   Bibasilar scarring.   Left colonic diverticulosis.  No active diverticulitis.     Electronically  Signed   By: Rolm Baptise M.D.   On: 07/18/2020 02:24   Assessment: Encounter Diagnoses  Name Primary?   Epigastric pain Yes   Early satiety    Weight loss    Abdominal bloating    Several months of upper digestive symptoms along with weight loss.  Positive H. pylori, treatment now completed.  Eradication confirmed on biopsies even though she was only part way through treatment at that point.  Thus she does not need repeat testing to confirm eradication at this point.  It is still not clear if the H. pylori completely explain her symptoms given lack of endoscopic findings.  She is concerned it may be gallbladder in nature since many female family members have had their gallbladder removed.  It is not entirely typical for biliary colic, no gallstones seen on the CTA (though small noncalcified stones may not be seen on CT scan).  Has elements of gastroparesis or functional dyspepsia. Bowel habits regular, long time since last colonoscopy.   Plan: She is no longer on acid suppression, finish that along with the H. pylori treatment.  No ulcer seen on EGD, so probably does not need PPI at this point.  I scheduled a gastric emptying study.  I also offered her a colonoscopy given the reported weight loss.  No mass seen on the CTA, but some limitations on visualization of the GI lumen on such a study without oral contrast.  She was not opposed to that, but wanted to wait until further upper GI testing.  40 minutes were spent on this encounter (including chart review, history/exam, counseling/coordination of care, and documentation) > 50% of that time was spent on counseling and coordination of care.  Topics discussed included: see above   Nelida Meuse III  CC: Referring provider noted above

## 2020-08-17 NOTE — Patient Instructions (Signed)
If you are age 71 or older, your body mass index should be between 23-30. Your Body mass index is 26.09 kg/m. If this is out of the aforementioned range listed, please consider follow up with your Primary Care Provider.  If you are age 48 or younger, your body mass index should be between 19-25. Your Body mass index is 26.09 kg/m. If this is out of the aformentioned range listed, please consider follow up with your Primary Care Provider.   __________________________________________________________  The [facility] providers would like to encourage you to use [facility] to communicate with providers for non-urgent requests or questions.  Due to long hold times on the telephone, sending your provider a message by [facility] may be a faster and more efficient way to get a response.  Please allow 48 business hours for a response.  Please remember that this is for non-urgent requests.   You have been scheduled for a gastric emptying scan at [facility] on 08-30-2020 at 7am. Please arrive at least 30 minutes prior to your appointment for registration ( please report to Short stay). Please make certain not to have anything to eat or drink after midnight the night before your test. Hold all stomach medications (ex: Zofran, phenergan, Reglan) 48 hours prior to your test. If you need to reschedule your appointment, please contact radiology scheduling at [phone]. _____________________________________________________________________ A gastric-emptying study measures how long it takes for food to move through your stomach. There are several ways to measure stomach emptying. In the most common test, you eat food that contains a small amount of radioactive material. A scanner that detects the movement of the radioactive material is placed over your abdomen to monitor the rate at which food leaves your stomach. This test normally takes about 4 hours to  complete. _____________________________________________________________________  It was a pleasure to see you today!  Thank you for trusting me with your gastrointestinal care!

## 2020-08-18 ENCOUNTER — Other Ambulatory Visit: Payer: Self-pay | Admitting: Internal Medicine

## 2020-08-18 ENCOUNTER — Ambulatory Visit (INDEPENDENT_AMBULATORY_CARE_PROVIDER_SITE_OTHER): Payer: Medicare HMO | Admitting: Internal Medicine

## 2020-08-18 VITALS — BP 122/59 | HR 79 | Temp 98.4°F | Wt 154.1 lb

## 2020-08-18 DIAGNOSIS — I700 Atherosclerosis of aorta: Secondary | ICD-10-CM

## 2020-08-18 DIAGNOSIS — B9681 Helicobacter pylori [H. pylori] as the cause of diseases classified elsewhere: Secondary | ICD-10-CM

## 2020-08-18 DIAGNOSIS — K2970 Gastritis, unspecified, without bleeding: Secondary | ICD-10-CM | POA: Diagnosis not present

## 2020-08-18 DIAGNOSIS — M8080XA Other osteoporosis with current pathological fracture, unspecified site, initial encounter for fracture: Secondary | ICD-10-CM | POA: Diagnosis not present

## 2020-08-18 DIAGNOSIS — I10 Essential (primary) hypertension: Secondary | ICD-10-CM

## 2020-08-18 MED ORDER — VITAMIN D3 25 MCG (1000 UNIT) PO TABS: 1000.0000 [IU] | ORAL_TABLET | Freq: Every day | ORAL | 2 refills | Status: DC

## 2020-08-18 NOTE — Assessment & Plan Note (Signed)
Patient still endorses difficulty eating due to bloating and discomfort that follow meals about an hour later. She was seen by Dr. Loletha Carrow with GI yesterday and found to have resolution of H. Pylori infection and mildly active chronic gastritis. At this visit she was scheduled for a gastric emptying study and they will plan for colonoscopy at a later point.

## 2020-08-18 NOTE — Assessment & Plan Note (Signed)
Patient is currently taking fosamax 70 mg once weekly and vitamin D 25 mcg daily.  - Recheck vitamin D at next visit

## 2020-08-18 NOTE — Assessment & Plan Note (Signed)
Normotensive at 122/59.  - Continue lisinopril 20 mg daily.

## 2020-08-18 NOTE — Patient Instructions (Addendum)
Thank you for visiting the Internal Medicine Clinic today. It was a pleasure to meet you! Today we discussed your recent H. Pylori infection. I am so glad to hear that you are feeling better and that you have already scheduled follow-up with gastroenterology.   I have ordered the following for you:  Lab orders: Lipid panel  Follow-up: 3 months  Remember: If you have any questions or concerns, please call our clinic at [phone] between 9am-5pm and after hours call [phone] and ask for the internal medicine resident on call. If you feel you are having a medical emergency please call 911.  Farrel Gordon, DO

## 2020-08-18 NOTE — Assessment & Plan Note (Signed)
Patient currently taking pravastatin 10 mg daily. Last lipid panel was WNL but has not been checked since October 2020. - Lipid panel ordered

## 2020-08-18 NOTE — Progress Notes (Signed)
   CC: gastritis follow-up  HPI:  [name] is a 70 y.o. female with past medical history as listed below. Please see problem based charting for assessment and plan.   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  Respiratory:  Negative for shortness of breath.   Cardiovascular:  Negative for chest pain.  Gastrointestinal:  Positive for abdominal pain (with bloating after meals) and heartburn. Negative for constipation and diarrhea.  Genitourinary:  Negative for dysuria, frequency and urgency.  Neurological:  Negative for loss of consciousness, weakness and headaches.    Physical Exam:  Vitals:   08/18/20 1031  BP: (!) 0/0  Pulse: 79  Temp: 98.4 F (36.9 C)  TempSrc: Oral  SpO2: 99%  Weight: 154 lb 1.6 oz (69.9 kg)   Physical Exam Vitals and nursing note reviewed.  Constitutional:      Appearance: Normal appearance.  Cardiovascular:     Rate and Rhythm: Normal rate and regular rhythm.     Heart sounds: Normal heart sounds.  Pulmonary:     Effort: Pulmonary effort is normal.     Breath sounds: Normal breath sounds.  Abdominal:     Palpations: Abdomen is soft.     Tenderness: There is abdominal tenderness. There is no guarding.  Skin:    General: Skin is warm and dry.  Neurological:     General: No focal deficit present.     Mental Status: She is alert and oriented to person, place, and time.     Assessment & Plan:   See Encounters Tab for problem  based charting.  Patient discussed with Dr. Jimmye Norman

## 2020-08-19 LAB — LIPID PANEL
Chol/HDL Ratio: 3.5 ratio (ref 0.0–4.4)
Cholesterol, Total: 142 mg/dL (ref 100–199)
HDL: 41 mg/dL (ref >39)
LDL Chol Calc (NIH): 70 mg/dL (ref 0–99)
Triglycerides: 181 mg/dL — ABNORMAL HIGH (ref 0–149)
VLDL Cholesterol Cal: 31 mg/dL (ref 5–40)

## 2020-08-23 ENCOUNTER — Other Ambulatory Visit: Payer: Self-pay | Admitting: Student

## 2020-08-23 DIAGNOSIS — I10 Essential (primary) hypertension: Secondary | ICD-10-CM

## 2020-08-30 ENCOUNTER — Other Ambulatory Visit: Payer: Self-pay

## 2020-08-30 ENCOUNTER — Ambulatory Visit (HOSPITAL_COMMUNITY)
Admission: RE | Admit: 2020-08-30 | Discharge: 2020-08-30 | Disposition: A | Discharge status: Home / Self Care | Payer: Medicare HMO | Source: Ambulatory Visit | Attending: Gastroenterology | Admitting: Gastroenterology

## 2020-08-30 DIAGNOSIS — R140 Abdominal distension (gaseous): Secondary | ICD-10-CM | POA: Diagnosis not present

## 2020-08-30 DIAGNOSIS — R1013 Epigastric pain: Secondary | ICD-10-CM | POA: Insufficient documentation

## 2020-08-30 DIAGNOSIS — R6881 Early satiety: Secondary | ICD-10-CM | POA: Insufficient documentation

## 2020-08-30 DIAGNOSIS — R634 Abnormal weight loss: Secondary | ICD-10-CM | POA: Diagnosis not present

## 2020-08-30 DIAGNOSIS — R112 Nausea with vomiting, unspecified: Secondary | ICD-10-CM | POA: Diagnosis not present

## 2020-08-30 MED ORDER — TECHNETIUM TC 99M SULFUR COLLOID
2.0000 | Freq: Once | INTRAVENOUS | Status: AC | PRN
  Administered 2020-08-30: 2 via INTRAVENOUS

## 2020-09-12 ENCOUNTER — Encounter: Payer: Self-pay | Admitting: Pharmacist

## 2020-09-12 ENCOUNTER — Ambulatory Visit (INDEPENDENT_AMBULATORY_CARE_PROVIDER_SITE_OTHER): Payer: Medicare HMO | Admitting: Pharmacist

## 2020-09-12 VITALS — BP 119/70

## 2020-09-12 DIAGNOSIS — Z0000 Encounter for general adult medical examination without abnormal findings: Secondary | ICD-10-CM

## 2020-09-12 NOTE — Progress Notes (Signed)
This AWV is being conducted by [place] only. The patient was located at home and I was located in [facility]. The patient's identity was confirmed using their DOB and current address. The patient or his/her legal guardian has consented to being evaluated through a telephone encounter and understands the associated risks (an examination cannot be done and the patient may need to come in for an appointment) / benefits (allows the patient to remain at home, decreasing exposure to coronavirus). I personally spent 24 minutes conducting the AWV.  Subjective:   Kimberly Walters is a 70 y.o. female who presents for a Medicare Annual Wellness Visit.  The following items have been reviewed and updated today in the appropriate area in the EMR.   Health Risk Assessment  Height, weight, BMI, and BP Visual acuity if needed Depression screen Fall risk / safety level Advance directive discussion Medical and family history were reviewed and updated Updating list of other providers & suppliers Medication reconciliation, including over the counter medicines Cognitive screen Written screening schedule Risk Factor list Personalized health advice, risky behaviors, and treatment advice  Social History   Social History Narrative   Current Social History 09/12/2020      Patient lives with 2 sons and granddaughter in a one story home. There are 3 steps with handrail up to the entrance the patient uses.       Patient's method of transportation is personal car.      The highest level of education was GED      The patient currently retired from [employer].      Identified important Relationships are "my children, my grandchildren, my great-grandchildren, and my sisters"      Pets : dog and Futures trader / Fun: "read, watch movies, go walking, and just do things with the children."      Current Stressors: "dog"      Religious / Personal Beliefs: "Old fashioned [place]."               Cardiac Risk Factors include: advanced age (>66mn, >>1women);smoking/ tobacco exposure;hypertension    Objective:    Vitals: BP 119/70   LMP 06/21/1973  Vitals are patient reported  Activities of Daily Living In your present state of health, do you have any difficulty performing the following activities: 09/12/2020 08/18/2020  Hearing? N N  Vision? N N  Difficulty concentrating or making decisions? N N  Walking or climbing stairs? N N  Comment - -  Dressing or bathing? N N  Doing errands, shopping? N N  Preparing Food and eating ? N -  Using the Toilet? N -  In the past six months, have you accidently leaked urine? N -  Do you have problems with loss of bowel control? N -  Managing your Medications? N -  Managing your Finances? N -  Housekeeping or managing your Housekeeping? N -  Some recent data might be hidden    Goals  Goals       Weight (lb) < 150 lb (68 kg) (pt-stated)        Fall Risk Fall Risk  09/12/2020 08/18/2020 07/27/2020 07/04/2020 02/16/2020  Falls in the past year? 0 0 0 0 0  Number falls in past yr: 0 0 - 0 -  Injury with Fall? 0 0 - 0 -  Risk for fall due to : No Fall Risks No Fall Risks - - No Fall Risks  Risk  for fall due to: Comment - - - - -  Follow up Falls evaluation completed;Falls prevention discussed Falls evaluation completed Falls evaluation completed - Falls prevention discussed    Depression Screen PHQ 2/9 Scores 08/18/2020 07/27/2020 07/04/2020 02/16/2020  PHQ - 2 Score 0 - 0 0  PHQ- 9 Score - - 0 1  Exception Documentation - Patient refusal - -     Cognitive Testing Six-Item Cognitive Screener   "I would like to ask you some questions that ask you to use your memory. I am going to name three objects. Please wait until I say all three words, then repeat them. Remember what they are  because I am going to ask you to name them again in a few minutes. Please repeat these words for me: APPLE--TABLE--PENNY." (Interviewer may repeat names 3  times if necessary but repetition not scored.)  Did patient correctly repeat all three words? Yes - may proceed with screen  What year is this? Correct What month is this? Correct What day of the week is this? Correct  What were the three objects I asked you to remember? Apple Correct Table Correct Penny Correct  Score one point for each incorrect answer.  A score of 2 or more points warrants additional investigation.  Patient's score 0     Assessment and Plan:    During the course of the visit the patient was educated and counseled about appropriate screening and preventive services as documented in the assessment and plan.  Recommended patient receive colonoscopy.  Recommended patient completely quit smoking cigarettes. Offered counseling. Patient declined. Informed patient if she decides she would like assistance with smoking cessation to reach out to clinic.  The printed AVS was given to the patient and included an updated screening schedule, a list of risk factors, and personalized health advice.        Hughes Better, RPH-CPP  09/12/2020

## 2020-09-13 ENCOUNTER — Telehealth: Payer: Self-pay

## 2020-09-13 ENCOUNTER — Encounter: Payer: Self-pay | Admitting: Gastroenterology

## 2020-09-13 ENCOUNTER — Ambulatory Visit: Payer: Medicare HMO | Admitting: Gastroenterology

## 2020-09-13 VITALS — BP 142/80 | HR 81 | Ht 64.0 in | Wt 153.2 lb

## 2020-09-13 DIAGNOSIS — Z1211 Encounter for screening for malignant neoplasm of colon: Secondary | ICD-10-CM | POA: Diagnosis not present

## 2020-09-13 DIAGNOSIS — R1013 Epigastric pain: Secondary | ICD-10-CM | POA: Diagnosis not present

## 2020-09-13 DIAGNOSIS — R6881 Early satiety: Secondary | ICD-10-CM

## 2020-09-13 MED ORDER — ONDANSETRON HCL 4 MG PO TABS: 4.0000 mg | ORAL_TABLET | Freq: Three times a day (TID) | ORAL | 0 refills | Status: DC | PRN

## 2020-09-13 MED ORDER — PLENVU 140 G PO SOLR: 140.0000 g | ORAL | 0 refills | Status: DC

## 2020-09-13 NOTE — Telephone Encounter (Signed)
Dr Alen Blew,   Please see request below for medication hold request     Request for surgical clearance:     Endoscopy Procedure  What type of surgery is being performed?     Colonoscopy  When is this surgery scheduled?     10-16-2020  What type of clearance is required ?   Pharmacy  Are there any medications that need to be held prior to surgery and how long? Yes, we would like to hold [place] name and name of physician performing surgery?      [facility]  What is your office phone and fax number?      Phone- [phone]  [phone]  Anesthesia type (None, local, MAC, general) ?       MAC

## 2020-09-13 NOTE — Telephone Encounter (Signed)
Patient has been notified and aware. She sates clear understanding to hold the Xarelto for 2 days.No additional questions at this time.

## 2020-09-13 NOTE — Progress Notes (Signed)
[facility] Progress Note  Chief Complaint: Epigastric pain  Subjective  History: Kimberly Walters was last seen 08/17/2020 after hospitalization for chest and epigastric pain, months of early satiety and nausea.  She was in the midst of outpatient H. pylori treatment at that point, had inpatient upper endoscopy with Dr. Silverio Decamp biopsies confirming H. pylori eradication.  She wondered about biliary colic, though symptoms were not typical for that in my opinion (see my office note for details).  Symptoms were slowly improving after the H. pylori treatment. I questioned possible delayed gastric emptying, gastric emptying study ordered.  Last colonoscopy many years ago in [facility] system by patient report, no documentation of that procedure found.  No critical mesenteric artery stenoses seen on inpatient CTA.  Deon says that she has continued to improve and epigastric pain is resolved at this point.  She still has intermittent nausea early satiety, and has discovered that certain foods contribute to this.  Sometimes dairy or certain meats and definitely onions that she recently experienced after consuming a hot dog. Bowel habits are regular without rectal bleeding.  ROS: Cardiovascular:  no chest pain Respiratory: no dyspnea  The patient's Past Medical, Family and Social History were reviewed and are on file in the EMR. 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 Alen Blew was reviewed.  History of PE, most recently June 2021.  Originally on Eliquis and  now Xarelto  Past Surgical History:  Procedure Laterality Date   BIOPSY  07/19/2020   Procedure: BIOPSY;  Surgeon: Mauri Pole, MD;  Location: [place];  Service: Endoscopy;;   ESOPHAGOGASTRODUODENOSCOPY (EGD) WITH PROPOFOL N/A 07/19/2020   Procedure: ESOPHAGOGASTRODUODENOSCOPY (EGD) WITH PROPOFOL;  Surgeon: Mauri Pole, MD;  Location: [facility];  Service: Endoscopy;  Laterality: N/A;   HYSTERECTOMY ABDOMINAL WITH SALPINGO-OOPHORECTOMY  1975   LYMPH NODE BIOPSY Right 1983   Neck   No family history colorectal cancer  Objective:  Med list reviewed  Current Outpatient Medications:    ondansetron (ZOFRAN) 4 MG tablet, Take 1 tablet (4 mg total) by mouth every 8 (eight) hours as needed for nausea or vomiting., Disp: 5 tablet, Rfl: 0   PEG-KCl-NaCl-NaSulf-Na Asc-C (PLENVU) 140 g SOLR, Take 140 g by mouth as directed. Manufacturer's coupon Universal coupon code:BIN: C1589615; GROUPSE:2314430; PCN: [id number]; IDCY:[id number]; PAY NO MORE $50, Disp: 1 each, Rfl: 0   acetaminophen (TYLENOL) 500 MG tablet, Take 1,000 mg by mouth every 6 (six) hours as needed for mild pain. (Patient not taking: Reported on 09/12/2020), Disp: , Rfl:    alendronate (FOSAMAX) 70 MG tablet, Take 1 tablet (70 mg total) by mouth every 7 (seven) days. Take with a full glass of water on an empty stomach., Disp: 4 tablet, Rfl: 11   bismuth subsalicylate (PEPTO-BISMOL) 262 MG/15ML suspension, Take 30 mLs by mouth 4 (four) times daily as needed. (Patient not taking: Reported on 09/12/2020), Disp: 240 mL, Rfl: 2   cholecalciferol (VITAMIN D) 25 MCG (1000 UNIT)  tablet, Take 1 tablet (1,000 Units total) by mouth daily., Disp: 30 tablet, Rfl: 2   lisinopril (ZESTRIL) 20 MG tablet, TAKE 1 TABLET EVERY DAY, Disp: 90 tablet, Rfl: 3   pravastatin (PRAVACHOL) 10 MG tablet, Take 1 tablet (10 mg total) by mouth daily., Disp: 90 tablet, Rfl: 3   XARELTO 20 MG TABS tablet, TAKE 1 TABLET(20 MG) BY MOUTH DAILY WITH SUPPER, Disp:  30 tablet, Rfl: 3   Vital signs in last 24 hrs: Vitals:   09/13/20 0835  BP: (!) 142/80  Pulse: 81   Wt Readings from Last 3 Encounters:  09/13/20 153 lb 4 oz (69.5 kg)  08/18/20 154 lb 1.6 oz (69.9 kg)  08/17/20 152 lb (68.9 kg)   Physical Exam  Well-appearing HEENT: sclera anicteric, oral mucosa moist without lesions Neck: supple, no thyromegaly, JVD or lymphadenopathy Cardiac: RRR without murmurs, S1S2 heard, no peripheral edema Pulm: clear to auscultation bilaterally, normal RR and effort noted Abdomen: soft, no tenderness, with active bowel sounds. No guarding or palpable hepatosplenomegaly.  No bruit Skin; warm and dry, no jaundice or rash  Labs:   ___________________________________________ Radiologic studies: Normal gastric emptying study 08/30/2020  ____________________________________________ Other:   _____________________________________________ Assessment & Plan  Assessment: Encounter Diagnoses  Name Primary?   Epigastric pain Yes   Special screening for malignant neoplasms, colon    Early satiety    Her upper digestive symptoms have abated after H. pylori treatment.  It sounds like there may be some dietary triggers as well.  Average risk colorectal cancer, long overdue for screening. We discussed options of Cologuard along with its risks and benefits, false positives and negatives versus colonoscopy and its risks and benefits.  She decided to undergo screening colonoscopy.  The benefits and risks of the planned procedure were described in detail with the patient or (when appropriate) their health care proxy.  Risks were outlined as including, but not limited to, bleeding, infection, perforation, adverse medication reaction leading to cardiac or pulmonary decompensation, pancreatitis (if ERCP).  The limitation of incomplete mucosal visualization was also discussed.  No guarantees or warranties were given.  She needs to be off Xarelto 2 days prior to  procedure, and understands the small but real risk of pulmonary embolism during that time.  We will message her hematologist for an opinion on this [facility] hold and whether or not she needs bridging Lovenox and management of that if needed.  She recalls difficulty with nausea and vomiting for bowel preparation last time, so will be prescribed 2 doses of Zofran.   32 minutes were spent on this encounter (including chart review, history/exam, counseling/coordination of care, and documentation) > 50% of that time was spent on counseling and coordination of care.  Topics discussed included: See above.  Nelida Meuse III

## 2020-09-13 NOTE — Patient Instructions (Addendum)
If you are age 71 or older, your body mass index should be between 23-30. Your Body mass index is 26.31 kg/m. If this is out of the aforementioned range listed, please consider follow up with your Primary Care Provider.  If you are age 24 or younger, your body mass index should be between 19-25. Your Body mass index is 26.31 kg/m. If this is out of the aformentioned range listed, please consider follow up with your Primary Care Provider.   You will be contacted by our office prior to your procedure for directions on holding your Xarelto.  If you do not hear from our office 1 week prior to your scheduled procedure, please call [phone] to discuss.    __________________________________________________________  The [facility] providers would like to encourage you to use [facility] to communicate with providers for non-urgent requests or questions.  Due to long hold times on the telephone, sending your provider a message by [facility] may be a faster and more efficient way to get a response.  Please allow 48 business hours for a response.  Please remember that this is for non-urgent requests.   You have been scheduled for a colonoscopy. Please follow written instructions given to you at your visit today.  Please pick up your prep supplies at the pharmacy within the next 1-3 days. If you use inhalers (even only as needed), please bring them with you on the day of your procedure.  It was a pleasure to see you today!  Thank you for trusting me with your gastrointestinal care!

## 2020-09-20 NOTE — Patient Instructions (Addendum)
Annual Wellness Visit   Medicare Covered Preventative Screenings and Services  Services & Screenings Men and Women Who How Often Need? Date of Last Service Action  Abdominal Aortic Aneurysm Adults with AAA risk factors Once     Alcohol Misuse and Counseling All Adults Screening once a year if no alcohol misuse. Counseling up to 4 face to face sessions.     Bone Density Measurement  Adults at risk for osteoporosis Once every 2 yrs     Lipid Panel Z13.6 All adults without CV disease Once every 5 yrs     Colorectal Cancer  Stool sample or Colonoscopy All adults 45 and older  Once every year Every 10 years     Depression All Adults Once a year  Today   Diabetes Screening Blood glucose, post glucose load, or GTT Z13.1 All adults at risk Pre-diabetics Once per year Twice per year     Diabetes  Self-Management Training All adults Diabetics 10 hrs first year; 2 hours subsequent years. Requires Copay     Glaucoma Diabetics Family history of glaucoma African Americans 83 yrs + Hispanic Americans 34 yrs + Annually - requires coppay     Hepatitis C Z72.89 or F19.20 High Risk for HCV Born between 1945 and 1965 Annually Once     HIV Z11.4 All adults based on risk Annually btw ages 74 & 79 regardless of risk Annually > 65 yrs if at increased risk     Lung Cancer Screening Asymptomatic adults aged 39-77 with 30 pack yr history and current smoker OR quit within the last 15 yrs Annually Must have counseling and shared decision making documentation before first screen     Medical Nutrition Therapy Adults with  Diabetes Renal disease Kidney transplant within past 3 yrs 3 hours first year; 2 hours subsequent years     Obesity and Counseling All adults Screening once a year Counseling if BMI 30 or higher  Today   Tobacco Use Counseling Adults who use tobacco  Up to 8 visits in one year     Vaccines Z23 Hepatitis B Influenza  Pneumonia  Adults  Once Once every flu season Two different  vaccines separated by one year     Next Annual Wellness Visit People with Medicare Every year  Today     Services & Screenings Women Who How Often Need  Date of Last Service Action  Mammogram  Z12.31 Women over 22 One baseline ages 62-39. Annually ager 40 yrs+     Pap tests All women Annually if high risk. Every 2 yrs for normal risk women     Screening for cervical cancer with  Pap (Z01.419 nl or Z01.411abnl) & HPV Z11.51 Women aged 67 to 27 Once every 5 yrs     Screening pelvic and breast exams All women Annually if high risk. Every 2 yrs for normal risk women     Sexually Transmitted Diseases Chlamydia Gonorrhea Syphilis All at risk adults Annually for non pregnant females at increased risk         [place] Men Who How Ofter Need  Date of Last Service Action  Prostate Cancer - DRE & PSA Men over 50 Annually.  DRE might require a copay.     Sexually Transmitted Diseases Syphilis All at risk adults Annually for men at increased risk         Things That May Be Affecting Your Health:  Alcohol  Hearing loss  Pain    Depression  Home  Safety  Sexual Health   Diabetes  Lack of physical activity  Stress   Difficulty with daily activities  Loneliness  Tiredness   Drug use X Medicines X Tobacco use   Falls  Motor Vehicle Safety  Weight   Food choices  Oral Health  Other    YOUR PERSONALIZED HEALTH PLAN : 1. Schedule your next subsequent Medicare Wellness visit in one year 2. Attend all of your regular appointments to address your medical issues 3. Complete the preventative screenings and services 4. Recommended colonoscopy 5. Discussed working towards quitting smoking. Let us know if you would like assistance with doing so in the future.   Colonoscopy, Adult A colonoscopy is a procedure to look at the entire large intestine. This procedure is done using a long, thin, flexible tube that has a camera on the end. You may have a colonoscopy: As a part of normal  colorectal screening. If you have certain symptoms, such as: A low number of red blood cells in your blood (anemia). Diarrhea that does not go away. Pain in your abdomen. Blood in your stool. A colonoscopy can help screen for and diagnose medical problems, including: Tumors. Extra tissue that grows where mucus forms (polyps). Inflammation. Areas of bleeding. Tell your health care provider about: Any allergies you have. All medicines you are taking, including vitamins, herbs, eye drops, creams, and over-the-counter medicines. Any problems you or family members have had with anesthetic medicines. Any blood disorders you have. Any surgeries you have had. Any medical conditions you have. Any problems you have had with having bowel movements. Whether you are pregnant or may be pregnant. What are the risks? Generally, this is a safe procedure. However, problems may occur, including: Bleeding. Damage to your intestine. Allergic reactions to medicines given during the procedure. Infection. This is rare. What happens before the procedure? Eating and drinking restrictions Follow instructions from your health care provider about eating or drinking restrictions, which may include: A few days before the procedure: Follow a low-fiber diet. Avoid nuts, seeds, dried fruit, raw fruits, and vegetables. 1-3 days before the procedure: Eat only gelatin dessert or ice pops. Drink only clear liquids, such as water, clear juice, clear broth or bouillon, black coffee or tea, or clear soft drinks or sports drinks. Avoid liquids that contain red or purple dye. The day of the procedure: Do not eat solid foods. You may continue to drink clear liquids until up to 2 hours before the procedure. Do not eat or drink anything starting 2 hours before the procedure, or within the time period that your health care provider recommends. Bowel prep If you were prescribed a bowel prep to take by mouth (orally) to  clean out your colon: Take it as told by your health care provider. Starting the day before your procedure, you will need to drink a large amount of liquid medicine. The liquid will cause you to have many bowel movements of loose stool until your stool becomes almost clear or light green. If your skin or the opening between the buttocks (anus) gets irritated from diarrhea, you may relieve the irritation using: Wipes with medicine in them, such as adult wet wipes with aloe and vitamin E. A product to soothe skin, such as petroleum jelly. If you vomit while drinking the bowel prep: Take a break for up to 60 minutes. Begin the bowel prep again. Call your health care provider if you keep vomiting or you cannot take the bowel prep without vomiting. To  clean out your colon, you may also be given: Laxative medicines. These help you have a bowel movement. Instructions for enema use. An enema is liquid medicine injected into your rectum. Medicines Ask your health care provider about: Changing or stopping your regular medicines or supplements. This is especially important if you are taking iron supplements, diabetes medicines, or blood thinners. Taking medicines such as aspirin and ibuprofen. These medicines can thin your blood. Do not take these medicines unless your health care provider tells you to take them. Taking over-the-counter medicines, vitamins, herbs, and supplements. General instructions Ask your health care provider what steps will be taken to help prevent infection. These may include washing skin with a germ-killing soap. Plan to have someone take you home from the hospital or clinic. What happens during the procedure?  An IV will be inserted into one of your veins. You may be given one or more of the following: A medicine to help you relax (sedative). A medicine to numb the area (local anesthetic). A medicine to make you fall asleep (general anesthetic). This is rarely needed. You  will lie on your side with your knees bent. The tube will: Have oil or gel put on it (be lubricated). Be inserted into your anus. Be gently eased through all parts of your large intestine. Air will be sent into your colon to keep it open. This may cause some pressure or cramping. Images will be taken with the camera and will appear on a screen. A small tissue sample may be removed to be looked at under a microscope (biopsy). The tissue may be sent to a lab for testing if any signs of problems are found. If small polyps are found, they may be removed and checked for cancer cells. When the procedure is finished, the tube will be removed. The procedure may vary among health care providers and hospitals. What happens after the procedure? Your blood pressure, heart rate, breathing rate, and blood oxygen level will be monitored until you leave the hospital or clinic. You may have a small amount of blood in your stool. You may pass gas and have mild cramping or bloating in your abdomen. This is caused by the air that was used to open your colon during the exam. Do not drive for 24 hours after the procedure. It is up to you to get the results of your procedure. Ask your health care provider, or the department that is doing the procedure, when your results will be ready. Summary A colonoscopy is a procedure to look at the entire large intestine. Follow instructions from your health care provider about eating and drinking before the procedure. If you were prescribed an oral bowel prep to clean out your colon, take it as told by your health care provider. During the colonoscopy, a flexible tube with a camera on its end is inserted into the anus and then passed into the other parts of the large intestine. This information is not intended to replace advice given to you by your health care provider. Make sure you discuss any questions you have with your health care provider. Document Revised: 07/31/2018  Document Reviewed: 07/31/2018 Elsevier Patient Education  [place] Prevention in the Home, Adult Falls can cause injuries and can happen to people of all ages. There are many things you can do to make your home safe and to help prevent falls. Ask for help when making these changes. What actions can I take to prevent falls? General  Instructions Use good lighting in all rooms. Replace any light bulbs that burn out. Turn on the lights in dark areas. Use night-lights. Keep items that you use often in easy-to-reach places. Lower the shelves around your home if needed. Set up your furniture so you have a clear path. Avoid moving your furniture around. Do not have throw rugs or other things on the floor that can make you trip. Avoid walking on wet floors. If any of your floors are uneven, fix them. Add color or contrast paint or tape to clearly mark and help you see: Grab bars or handrails. First and last steps of staircases. Where the edge of each step is. If you use a stepladder: Make sure that it is fully opened. Do not climb a closed stepladder. Make sure the sides of the stepladder are locked in place. Ask someone to hold the stepladder while you use it. Know where your pets are when moving through your home. What can I do in the bathroom?   Keep the floor dry. Clean up any water on the floor right away. Remove soap buildup in the tub or shower. Use nonskid mats or decals on the floor of the tub or shower. Attach bath mats securely with double-sided, nonslip rug tape. If you need to sit down in the shower, use a plastic, nonslip stool. Install grab bars by the toilet and in the tub and shower. Do not use towel bars as grab bars. What can I do in the bedroom? Make sure that you have a light by your bed that is easy to reach. Do not use any sheets or blankets for your bed that hang to the floor. Have a firm chair with side arms that you can use for support when you get  dressed. What can I do in the kitchen? Clean up any spills right away. If you need to reach something above you, use a step stool with a grab bar. Keep electrical cords out of the way. Do not use floor polish or wax that makes floors slippery. What can I do with my stairs? Do not leave any items on the stairs. Make sure that you have a light switch at the top and the bottom of the stairs. Make sure that there are handrails on both sides of the stairs. Fix handrails that are broken or loose. Install nonslip stair treads on all your stairs. Avoid having throw rugs at the top or bottom of the stairs. Choose a carpet that does not hide the edge of the steps on the stairs. Check carpeting to make sure that it is firmly attached to the stairs. Fix carpet that is loose or worn. What can I do on the outside of my home? Use bright outdoor lighting. Fix the edges of walkways and driveways and fix any cracks. Remove anything that might make you trip as you walk through a door, such as a raised step or threshold. Trim any bushes or trees on paths to your home. Check to see if handrails are loose or broken and that both sides of all steps have handrails. Install guardrails along the edges of any raised decks and porches. Clear paths of anything that can make you trip, such as tools or rocks. Have leaves, snow, or ice cleared regularly. Use sand or salt on paths during winter. Clean up any spills in your garage right away. This includes grease or oil spills. What other actions can I take? Wear shoes that: Have a low heel.  Do not wear high heels. Have rubber bottoms. Feel good on your feet and fit well. Are closed at the toe. Do not wear open-toe sandals. Use tools that help you move around if needed. These include: Canes. Walkers. Scooters. Crutches. Review your medicines with your doctor. Some medicines can make you feel dizzy. This can increase your chance of falling. Ask your doctor what  else you can do to help prevent falls. Where to find more information Centers for Disease Control and Prevention, [employer]: http://www.wolf.info/ National Institute on Aging: http://kim-miller.com/ Contact a doctor if: You are afraid of falling at home. You feel weak, drowsy, or dizzy at home. You fall at home. Summary There are many simple things that you can do to make your home safe and to help prevent falls. Ways to make your home safe include removing things that can make you trip and installing grab bars in the bathroom. Ask for help when making these changes in your home. This information is not intended to replace advice given to you by your health care provider. Make sure you discuss any questions you have with your health care provider. Document Revised: 08/11/2019 Document Reviewed: 08/11/2019 Elsevier Patient Education  [place] Maintenance, Female Adopting a healthy lifestyle and getting preventive care are important in promoting health and wellness. Ask your health care provider about: The right schedule for you to have regular tests and exams. Things you can do on your own to prevent diseases and keep yourself healthy. What should I know about diet, weight, and exercise? Eat a healthy diet  Eat a diet that includes plenty of vegetables, fruits, low-fat dairy products, and lean protein. Do not eat a lot of foods that are high in solid fats, added sugars, or sodium. Maintain a healthy weight Body mass index (BMI) is used to identify weight problems. It estimates body fat based on height and weight. Your health care provider can help determine your BMI and help you achieve or maintain a healthy weight. Get regular exercise Get regular exercise. This is one of the most important things you can do for your health. Most adults should: Exercise for at least 150 minutes each week. The exercise should increase your heart rate and make you sweat (moderate-intensity exercise). Do  strengthening exercises at least twice a week. This is in addition to the moderate-intensity exercise. Spend less time sitting. Even light physical activity can be beneficial. Watch cholesterol and blood lipids Have your blood tested for lipids and cholesterol at 71 years of age, then have this test every 5 years. Have your cholesterol levels checked more often if: Your lipid or cholesterol levels are high. You are older than 71 years of age. You are at high risk for heart disease. What should I know about cancer screening? Depending on your health history and family history, you may need to have cancer screening at various ages. This may include screening for: Breast cancer. Cervical cancer. Colorectal cancer. Skin cancer. Lung cancer. What should I know about heart disease, diabetes, and high blood pressure? Blood pressure and heart disease High blood pressure causes heart disease and increases the risk of stroke. This is more likely to develop in people who have high blood pressure readings, are of African descent, or are overweight. Have your blood pressure checked: Every 3-5 years if you are 60-14 years of age. Every year if you are 51 years old or older. Diabetes Have regular diabetes screenings. This checks your fasting blood sugar level.  Have the screening done: Once every three years after age 30 if you are at a normal weight and have a low risk for diabetes. More often and at a younger age if you are overweight or have a high risk for diabetes. What should I know about preventing infection? Hepatitis B If you have a higher risk for hepatitis B, you should be screened for this virus. Talk with your health care provider to find out if you are at risk for hepatitis B infection. Hepatitis C Testing is recommended for: Everyone born from [birth year] through 1966. Anyone with known risk factors for hepatitis C. Sexually transmitted infections (STIs) Get screened for STIs, including  gonorrhea and chlamydia, if: You are sexually active and are younger than 71 years of age. You are older than 71 years of age and your health care provider tells you that you are at risk for this type of infection. Your sexual activity has changed since you were last screened, and you are at increased risk for chlamydia or gonorrhea. Ask your health care provider if you are at risk. Ask your health care provider about whether you are at high risk for HIV. Your health care provider may recommend a prescription medicine to help prevent HIV infection. If you choose to take medicine to prevent HIV, you should first get tested for HIV. You should then be tested every 3 months for as long as you are taking the medicine. Pregnancy If you are about to stop having your period (premenopausal) and you may become pregnant, seek counseling before you get pregnant. Take 400 to 800 micrograms (mcg) of folic acid every day if you become pregnant. Ask for birth control (contraception) if you want to prevent pregnancy. Osteoporosis and menopause Osteoporosis is a disease in which the bones lose minerals and strength with aging. This can result in bone fractures. If you are 48 years old or older, or if you are at risk for osteoporosis and fractures, ask your health care provider if you should: Be screened for bone loss. Take a calcium or vitamin D supplement to lower your risk of fractures. Be given hormone replacement therapy (HRT) to treat symptoms of menopause. Follow these instructions at home: Lifestyle Do not use any products that contain nicotine or tobacco, such as cigarettes, e-cigarettes, and chewing tobacco. If you need help quitting, ask your health care provider. Do not use street drugs. Do not share needles. Ask your health care provider for help if you need support or information about quitting drugs. Alcohol use Do not drink alcohol if: Your health care provider tells you not to drink. You are  pregnant, may be pregnant, or are planning to become pregnant. If you drink alcohol: Limit how much you use to 0-1 drink a day. Limit intake if you are breastfeeding. Be aware of how much alcohol is in your drink. In the [place] one drink equals one 12 oz bottle of beer (355 mL), one 5 oz glass of wine (148 mL), or one 1 oz glass of hard liquor (44 mL). General instructions Schedule regular health, dental, and eye exams. Stay current with your vaccines. Tell your health care provider if: You often feel depressed. You have ever been abused or do not feel safe at home. Summary Adopting a healthy lifestyle and getting preventive care are important in promoting health and wellness. Follow your health care provider's instructions about healthy diet, exercising, and getting tested or screened for diseases. Follow your health care provider's instructions on monitoring  your cholesterol and blood pressure. This information is not intended to replace advice given to you by your health care provider. Make sure you discuss any questions you have with your health care provider. Document Revised: 03/17/2020 Document Reviewed: 12/31/2017 Elsevier Patient Education  2022 [employer].

## 2020-09-21 ENCOUNTER — Other Ambulatory Visit: Payer: Self-pay | Admitting: Internal Medicine

## 2020-09-21 DIAGNOSIS — Z1231 Encounter for screening mammogram for malignant neoplasm of breast: Secondary | ICD-10-CM

## 2020-10-13 NOTE — Progress Notes (Signed)
Internal Medicine Clinic Attending  Case discussed with Dr. Gilford Rile.  I reviewed the AWV findings.  I agree with the assessment, diagnosis, and plan of care documented in the AWV note.

## 2020-10-13 NOTE — Progress Notes (Signed)
I discussed the AWV findings with the provider who conducted the visit. I was present in the office suite and immediately available to provide assistance and direction throughout the time the service was provided.  Maudie Mercury, MD Internal Medicine Resident PGY-3 [facility] 10/13/2020 11:52 AM

## 2020-10-16 ENCOUNTER — Other Ambulatory Visit: Payer: Self-pay

## 2020-10-16 ENCOUNTER — Encounter: Payer: Self-pay | Admitting: Gastroenterology

## 2020-10-16 ENCOUNTER — Ambulatory Visit (AMBULATORY_SURGERY_CENTER): Payer: Medicare HMO | Admitting: Gastroenterology

## 2020-10-16 VITALS — BP 118/76 | HR 63 | Temp 97.1°F | Resp 16 | Ht 64.0 in | Wt 153.0 lb

## 2020-10-16 DIAGNOSIS — D125 Benign neoplasm of sigmoid colon: Secondary | ICD-10-CM | POA: Diagnosis not present

## 2020-10-16 DIAGNOSIS — D122 Benign neoplasm of ascending colon: Secondary | ICD-10-CM | POA: Diagnosis not present

## 2020-10-16 DIAGNOSIS — Z1211 Encounter for screening for malignant neoplasm of colon: Secondary | ICD-10-CM | POA: Diagnosis not present

## 2020-10-16 DIAGNOSIS — D123 Benign neoplasm of transverse colon: Secondary | ICD-10-CM

## 2020-10-16 DIAGNOSIS — D124 Benign neoplasm of descending colon: Secondary | ICD-10-CM

## 2020-10-16 MED ORDER — SODIUM CHLORIDE 0.9 % IV SOLN: 500.0000 mL | Freq: Once | INTRAVENOUS | Status: DC

## 2020-10-16 NOTE — Progress Notes (Signed)
Sedate, gd SR, tolerated procedure well, VSS, report to RN 

## 2020-10-16 NOTE — Op Note (Addendum)
[place] Patient Name: Kimberly Walters Procedure Date: 10/16/2020 7:34 AM MRN: [id number] Endoscopist: Mallie Mussel L. Loletha Carrow , MD Age: 70 Referring MD:  Date of Birth: Jun 10, 1950 Gender: Female Account #: [id number] Procedure:                Colonoscopy Indications:              Screening for colorectal malignant neoplasm                           last colonoscopy > 10 years ago Medicines:                Monitored Anesthesia Care Procedure:                Pre-Anesthesia Assessment:                           - Prior to the procedure, a History and Physical                            was performed, and patient medications and                            allergies were reviewed. The patient's tolerance of                            previous anesthesia was also reviewed. The risks                            and benefits of the procedure and the sedation                            options and risks were discussed with the patient.                            All questions were answered, and informed consent                            was obtained. Prior Anticoagulants: The patient has                            taken Xarelto (rivaroxaban), last dose was 2 days                            prior to procedure. ASA Grade Assessment: II - A                            patient with mild systemic disease. After reviewing                            the risks and benefits, the patient was deemed in                            satisfactory condition to undergo the procedure.  After obtaining informed consent, the colonoscope                            was passed under direct vision. Throughout the                            procedure, the patient's blood pressure, pulse, and                            oxygen saturations were monitored continuously. The                            PCF-HQ190L Colonoscope was introduced through the                            anus and advanced to  the the terminal ileum, with                            identification of the appendiceal orifice and IC                            valve. The colonoscopy was performed without                            difficulty. The patient tolerated the procedure                            well. The quality of the bowel preparation was                            excellent. The terminal ileum, ileocecal valve,                            appendiceal orifice, and rectum were photographed. Scope In: 8:22:59 AM Scope Out: 8:40:20 AM Scope Withdrawal Time: 0 hours 15 minutes 1 second  Total Procedure Duration: 0 hours 17 minutes 21 seconds  Findings:                 The perianal and digital rectal examinations were                            normal.                           The terminal ileum appeared normal.                           Four sessile polyps were found in the sigmoid                            colon, descending colon, transverse colon and                            ascending colon. The polyps were 2 to 6 mm in size.  These polyps were removed with a cold snare.                            Resection and retrieval were complete.                           Multiple diverticula were found in the entire colon.                           The exam was otherwise without abnormality on                            direct and retroflexion views. Complications:            No immediate complications. Estimated Blood Loss:     Estimated blood loss was minimal. Impression:               - The examined portion of the ileum was normal.                           - Four 2 to 6 mm polyps in the sigmoid colon, in                            the descending colon, in the transverse colon and                            in the ascending colon, removed with a cold snare.                            Resected and retrieved.                           - Diverticulosis in the entire examined colon.                            - The examination was otherwise normal on direct                            and retroflexion views. Recommendation:           - Patient has a contact number available for                            emergencies. The signs and symptoms of potential                            delayed complications were discussed with the                            patient. Return to normal activities tomorrow.                            Written discharge instructions were provided to the  patient.                           - Resume previous diet.                           - Resume Xarelto (rivaroxaban) at prior dose                            tomorrow.                           - Await pathology results.                           - Repeat colonoscopy is recommended for                            surveillance. The colonoscopy date will be                            determined after pathology results from today's                            exam become available for review. Kassy Mcenroe L. Loletha Carrow, MD 10/16/2020 8:47:05 AM This report has been signed electronically.

## 2020-10-16 NOTE — Progress Notes (Signed)
Pt's states no medical or surgical changes since previsit or office visit.  VS CW  

## 2020-10-16 NOTE — Progress Notes (Signed)
Called to room to assist during endoscopic procedure.  Patient ID and intended procedure confirmed with present staff. Received instructions for my participation in the procedure from the performing physician.  

## 2020-10-16 NOTE — Progress Notes (Signed)
No changes to clinical history since GI office visit on 09/13/20.  The patient is appropriate for an endoscopic procedure in the ambulatory setting.

## 2020-10-16 NOTE — Patient Instructions (Signed)
Impression/Recommendations:  Polyp and diverticulosis handouts given to patient.  Resume previous diet. Resume Xarelto (rivaroxaban) at prior dose tomorrow.  Await pathology results.  Repeat colonoscopy recommended for surveillance.  Date to be determined after pathology results reviewed.  YOU HAD AN ENDOSCOPIC PROCEDURE TODAY AT [facility]:   Refer to the procedure report that was given to you for any specific questions about what was found during the examination.  If the procedure report does not answer your questions, please call your gastroenterologist to clarify.  If you requested that your care partner not be given the details of your procedure findings, then the procedure report has been included in a sealed envelope for you to review at your convenience later.  YOU SHOULD EXPECT: Some feelings of bloating in the abdomen. Passage of more gas than usual.  Walking can help get rid of the air that was put into your GI tract during the procedure and reduce the bloating. If you had a lower endoscopy (such as a colonoscopy or flexible sigmoidoscopy) you may notice spotting of blood in your stool or on the toilet paper. If you underwent a bowel prep for your procedure, you may not have a normal bowel movement for a few days.  Please Note:  You might notice some irritation and congestion in your nose or some drainage.  This is from the oxygen used during your procedure.  There is no need for concern and it should clear up in a day or so.  SYMPTOMS TO REPORT IMMEDIATELY:  Following lower endoscopy (colonoscopy or flexible sigmoidoscopy):  Excessive amounts of blood in the stool  Significant tenderness or worsening of abdominal pains  Swelling of the abdomen that is new, acute  Fever of 100F or higher  For urgent or emergent issues, a gastroenterologist can be reached at any hour by calling [phone]. Do not use MyChart messaging for urgent concerns.    DIET:  We do  recommend a small meal at first, but then you may proceed to your regular diet.  Drink plenty of fluids but you should avoid alcoholic beverages for 24 hours.  ACTIVITY:  You should plan to take it easy for the rest of today and you should NOT DRIVE or use heavy machinery until tomorrow (because of the sedation medicines used during the test).    FOLLOW UP: Our staff will call the number listed on your records 48-72 hours following your procedure to check on you and address any questions or concerns that you may have regarding the information given to you following your procedure. If we do not reach you, we will leave a message.  We will attempt to reach you two times.  During this call, we will ask if you have developed any symptoms of COVID 19. If you develop any symptoms (ie: fever, flu-like symptoms, shortness of breath, cough etc.) before then, please call [phone].  If you test positive for Covid 19 in the 2 weeks post procedure, please call and report this information to [place].    If any biopsies were taken you will be contacted by phone or by letter within the next 1-3 weeks.  Please call us at [phone] if you have not heard about the biopsies in 3 weeks.    SIGNATURES/CONFIDENTIALITY: You and/or your care partner have signed paperwork which will be entered into your electronic medical record.  These signatures attest to the fact that that the information above on your After Visit Summary has been  reviewed and is understood.  Full responsibility of the confidentiality of this discharge information lies with you and/or your care-partner.

## 2020-10-18 ENCOUNTER — Telehealth: Payer: Self-pay

## 2020-10-18 NOTE — Telephone Encounter (Signed)
  Follow up Call-  Call back number 10/16/2020  Post procedure Call Back phone  # [phone]  Permission to leave phone message Yes  Some recent data might be hidden     Patient questions:  Do you have a fever, pain , or abdominal swelling? No. Pain Score  0 *  Have you tolerated food without any problems? Yes.    Have you been able to return to your normal activities? Yes.    Do you have any questions about your discharge instructions: Diet   No. Medications  No. Follow up visit  No.  Do you have questions or concerns about your Care? No.  Actions: * If pain score is 4 or above: No action needed, pain <4.  Have you developed a fever since your procedure? no  2.   Have you had an respiratory symptoms (SOB or cough) since your procedure? no  3.   Have you tested positive for COVID 19 since your procedure no  4.   Have you had any family members/close contacts diagnosed with the COVID 19 since your procedure?  no   If yes to any of these questions please route to Joylene John, RN and Joella Prince, RN

## 2020-10-19 ENCOUNTER — Encounter: Payer: Self-pay | Admitting: Gastroenterology

## 2020-12-06 ENCOUNTER — Other Ambulatory Visit: Payer: Self-pay | Admitting: Student

## 2020-12-06 ENCOUNTER — Other Ambulatory Visit: Payer: Self-pay | Admitting: Internal Medicine

## 2020-12-06 DIAGNOSIS — I700 Atherosclerosis of aorta: Secondary | ICD-10-CM

## 2020-12-13 ENCOUNTER — Other Ambulatory Visit: Payer: Self-pay | Admitting: Oncology

## 2021-02-01 ENCOUNTER — Ambulatory Visit (INDEPENDENT_AMBULATORY_CARE_PROVIDER_SITE_OTHER): Payer: Medicare HMO | Admitting: Internal Medicine

## 2021-02-01 ENCOUNTER — Other Ambulatory Visit: Payer: Self-pay

## 2021-02-01 ENCOUNTER — Ambulatory Visit (HOSPITAL_COMMUNITY)
Admission: RE | Admit: 2021-02-01 | Discharge: 2021-02-01 | Disposition: A | Discharge status: Home / Self Care | Payer: Medicare HMO | Source: Ambulatory Visit | Attending: Internal Medicine | Admitting: Internal Medicine

## 2021-02-01 ENCOUNTER — Inpatient Hospital Stay (HOSPITAL_COMMUNITY)
Admission: AD | Admit: 2021-02-01 | Discharge: 2021-02-03 | DRG: 190 | Disposition: A | Discharge status: Home / Self Care | Payer: Medicare HMO | Source: Ambulatory Visit | Attending: Student in an Organized Health Care Education/Training Program | Admitting: Student in an Organized Health Care Education/Training Program

## 2021-02-01 VITALS — BP 144/89 | HR 87 | Temp 98.2°F | Wt 154.5 lb

## 2021-02-01 DIAGNOSIS — Z800 Family history of malignant neoplasm of digestive organs: Secondary | ICD-10-CM | POA: Diagnosis not present

## 2021-02-01 DIAGNOSIS — Z7983 Long term (current) use of bisphosphonates: Secondary | ICD-10-CM

## 2021-02-01 DIAGNOSIS — D6851 Activated protein C resistance: Secondary | ICD-10-CM | POA: Diagnosis not present

## 2021-02-01 DIAGNOSIS — I341 Nonrheumatic mitral (valve) prolapse: Secondary | ICD-10-CM | POA: Diagnosis present

## 2021-02-01 DIAGNOSIS — Z8049 Family history of malignant neoplasm of other genital organs: Secondary | ICD-10-CM

## 2021-02-01 DIAGNOSIS — I480 Paroxysmal atrial fibrillation: Secondary | ICD-10-CM | POA: Diagnosis not present

## 2021-02-01 DIAGNOSIS — R079 Chest pain, unspecified: Secondary | ICD-10-CM | POA: Insufficient documentation

## 2021-02-01 DIAGNOSIS — Z8249 Family history of ischemic heart disease and other diseases of the circulatory system: Secondary | ICD-10-CM | POA: Diagnosis not present

## 2021-02-01 DIAGNOSIS — F17210 Nicotine dependence, cigarettes, uncomplicated: Secondary | ICD-10-CM | POA: Diagnosis present

## 2021-02-01 DIAGNOSIS — Z7901 Long term (current) use of anticoagulants: Secondary | ICD-10-CM | POA: Diagnosis not present

## 2021-02-01 DIAGNOSIS — I493 Ventricular premature depolarization: Secondary | ICD-10-CM | POA: Diagnosis present

## 2021-02-01 DIAGNOSIS — Z832 Family history of diseases of the blood and blood-forming organs and certain disorders involving the immune mechanism: Secondary | ICD-10-CM

## 2021-02-01 DIAGNOSIS — Z833 Family history of diabetes mellitus: Secondary | ICD-10-CM

## 2021-02-01 DIAGNOSIS — J9690 Respiratory failure, unspecified, unspecified whether with hypoxia or hypercapnia: Secondary | ICD-10-CM | POA: Diagnosis not present

## 2021-02-01 DIAGNOSIS — J189 Pneumonia, unspecified organism: Secondary | ICD-10-CM

## 2021-02-01 DIAGNOSIS — I10 Essential (primary) hypertension: Secondary | ICD-10-CM | POA: Diagnosis not present

## 2021-02-01 DIAGNOSIS — J9601 Acute respiratory failure with hypoxia: Secondary | ICD-10-CM

## 2021-02-01 DIAGNOSIS — R0789 Other chest pain: Secondary | ICD-10-CM

## 2021-02-01 DIAGNOSIS — Z808 Family history of malignant neoplasm of other organs or systems: Secondary | ICD-10-CM

## 2021-02-01 DIAGNOSIS — R0609 Other forms of dyspnea: Secondary | ICD-10-CM | POA: Diagnosis not present

## 2021-02-01 DIAGNOSIS — J9600 Acute respiratory failure, unspecified whether with hypoxia or hypercapnia: Secondary | ICD-10-CM | POA: Diagnosis present

## 2021-02-01 DIAGNOSIS — J439 Emphysema, unspecified: Principal | ICD-10-CM | POA: Diagnosis present

## 2021-02-01 DIAGNOSIS — Z79899 Other long term (current) drug therapy: Secondary | ICD-10-CM | POA: Diagnosis not present

## 2021-02-01 DIAGNOSIS — M8080XD Other osteoporosis with current pathological fracture, unspecified site, subsequent encounter for fracture with routine healing: Secondary | ICD-10-CM | POA: Diagnosis not present

## 2021-02-01 DIAGNOSIS — J441 Chronic obstructive pulmonary disease with (acute) exacerbation: Secondary | ICD-10-CM | POA: Diagnosis present

## 2021-02-01 DIAGNOSIS — I700 Atherosclerosis of aorta: Secondary | ICD-10-CM | POA: Diagnosis present

## 2021-02-01 DIAGNOSIS — I2782 Chronic pulmonary embolism: Secondary | ICD-10-CM | POA: Diagnosis present

## 2021-02-01 DIAGNOSIS — R0602 Shortness of breath: Secondary | ICD-10-CM | POA: Diagnosis present

## 2021-02-01 LAB — BASIC METABOLIC PANEL
Anion gap: 9 (ref 5–15)
BUN: 14 mg/dL (ref 8–23)
CO2: 30 mmol/L (ref 22–32)
Calcium: 9.6 mg/dL (ref 8.9–10.3)
Chloride: 104 mmol/L (ref 98–111)
Creatinine, Ser: 0.93 mg/dL (ref 0.44–1.00)
GFR, Estimated: >60 mL/min (ref >60)
Glucose, Bld: 83 mg/dL (ref 70–99)
Potassium: 5.8 mmol/L — ABNORMAL HIGH (ref 3.5–5.1)
Sodium: 143 mmol/L (ref 135–145)

## 2021-02-01 LAB — RESP PANEL BY RT-PCR (RSV, FLU A&B, COVID)  RVPGX2
Influenza A by PCR: NEGATIVE
Influenza B by PCR: NEGATIVE
Resp Syncytial Virus by PCR: NEGATIVE
SARS Coronavirus 2 by RT PCR: NEGATIVE

## 2021-02-01 LAB — CBC WITH DIFFERENTIAL/PLATELET
Abs Immature Granulocytes: 0.05 10*3/uL (ref 0.00–0.07)
Basophils Absolute: 0.1 10*3/uL (ref 0.0–0.1)
Basophils Relative: 1 %
Eosinophils Absolute: 0.3 10*3/uL (ref 0.0–0.5)
Eosinophils Relative: 3 %
HCT: 46.1 % — ABNORMAL HIGH (ref 36.0–46.0)
Hemoglobin: 15.1 g/dL — ABNORMAL HIGH (ref 12.0–15.0)
Immature Granulocytes: 1 %
Lymphocytes Relative: 23 %
Lymphs Abs: 2.2 10*3/uL (ref 0.7–4.0)
MCH: 31.2 pg (ref 26.0–34.0)
MCHC: 32.8 g/dL (ref 30.0–36.0)
MCV: 95.2 fL (ref 80.0–100.0)
Monocytes Absolute: 0.8 10*3/uL (ref 0.1–1.0)
Monocytes Relative: 8 %
Neutro Abs: 6.4 10*3/uL (ref 1.7–7.7)
Neutrophils Relative %: 64 %
Platelets: 286 10*3/uL (ref 150–400)
RBC: 4.84 MIL/uL (ref 3.87–5.11)
RDW: 12.5 % (ref 11.5–15.5)
WBC: 9.8 10*3/uL (ref 4.0–10.5)
nRBC: 0 % (ref 0.0–0.2)

## 2021-02-01 LAB — VITAMIN D 25 HYDROXY (VIT D DEFICIENCY, FRACTURES): Vit D, 25-Hydroxy: 24.68 ng/mL — ABNORMAL LOW (ref 30–100)

## 2021-02-01 MED ORDER — SODIUM CHLORIDE 0.9 % IV SOLN
1.0000 g | INTRAVENOUS | Status: DC
  Administered 2021-02-01: 1 g via INTRAVENOUS
  Filled 2021-02-01: qty 10

## 2021-02-01 MED ORDER — IPRATROPIUM-ALBUTEROL 0.5-2.5 (3) MG/3ML IN SOLN
3.0000 mL | Freq: Four times a day (QID) | RESPIRATORY_TRACT | Status: AC
Start: 2021-02-01 — End: 2021-02-02
  Administered 2021-02-01 – 2021-02-02 (×3): 3 mL via RESPIRATORY_TRACT
  Filled 2021-02-01 (×4): qty 3

## 2021-02-01 MED ORDER — PRAVASTATIN SODIUM 10 MG PO TABS
10.0000 mg | ORAL_TABLET | Freq: Every day | ORAL | Status: DC
  Administered 2021-02-02: 10 mg via ORAL
  Filled 2021-02-01 (×3): qty 1

## 2021-02-01 MED ORDER — LISINOPRIL 20 MG PO TABS
20.0000 mg | ORAL_TABLET | Freq: Every day | ORAL | Status: DC
  Administered 2021-02-02 – 2021-02-03 (×2): 20 mg via ORAL
  Filled 2021-02-01 (×2): qty 1

## 2021-02-01 MED ORDER — AZITHROMYCIN 500 MG PO TABS
500.0000 mg | ORAL_TABLET | Freq: Every day | ORAL | Status: AC
  Administered 2021-02-02: 500 mg via ORAL
  Filled 2021-02-01: qty 1

## 2021-02-01 MED ORDER — ACETAMINOPHEN 325 MG PO TABS
650.0000 mg | ORAL_TABLET | Freq: Four times a day (QID) | ORAL | Status: DC | PRN
  Administered 2021-02-01 – 2021-02-03 (×3): 650 mg via ORAL
  Filled 2021-02-01 (×3): qty 2

## 2021-02-01 MED ORDER — AZITHROMYCIN 250 MG PO TABS
250.0000 mg | ORAL_TABLET | Freq: Every day | ORAL | Status: DC
  Administered 2021-02-02: 250 mg via ORAL
  Filled 2021-02-01: qty 1

## 2021-02-01 MED ORDER — POLYETHYLENE GLYCOL 3350 17 G PO PACK: 17.0000 g | PACK | Freq: Every day | ORAL | Status: DC | PRN

## 2021-02-01 MED ORDER — PREDNISONE 20 MG PO TABS
40.0000 mg | ORAL_TABLET | Freq: Every day | ORAL | Status: DC
  Administered 2021-02-01 – 2021-02-03 (×2): 40 mg via ORAL
  Filled 2021-02-01 (×3): qty 2

## 2021-02-01 MED ORDER — ACETAMINOPHEN 650 MG RE SUPP: 650.0000 mg | Freq: Four times a day (QID) | RECTAL | Status: DC | PRN

## 2021-02-01 MED ORDER — RIVAROXABAN 20 MG PO TABS
20.0000 mg | ORAL_TABLET | Freq: Every day | ORAL | Status: DC
  Administered 2021-02-02: 20 mg via ORAL
  Filled 2021-02-01: qty 1

## 2021-02-01 NOTE — H&P (Signed)
Date: 02/01/2021               Patient Name:  Kimberly Walters MRN: [id number]  DOB: 01-19-1951 Age / Sex: 71 y.o., female   PCP: Farrel Gordon, DO         Medical Service: Internal Medicine Teaching Service         Attending Physician: Dr. Velna Ochs, MD    First Contact: Dr. Howie Ill Pager: [phone]  Second Contact: Dr. Alfonse Spruce Pager: [phone]       After Hours (After 5p/  First Contact Pager: [phone]  weekends / holidays): Second Contact Pager: [phone]   Chief Complaint: SOB  History of Present Illness:   Kimberly Walters is a 71 y/o female with a PMHx of Factor V Leiden on Xarelto, COPD, HTN who presented to the clinic with SOB.   Kimberly Walters states she has been experiencing worsening DOE and fatigue over the last several weeks but it has worsened significantly in the last week. She endorses a predominantly non-productive cough for the last several days. When it is productive, it is a scant amount and white. She is experiencing significant chest pain when coughing.  She endorses some nausea after coughing with abdominal cramps but no vomiting. Kimberly Walters endorses orthopnea but notes that it is chronic since her last PE - no change compared to baseline. She denies any leg swelling.   She denies any fever, chills, headaches, palpitations, diarrhea, constipation, dysuria. She denies any sick contacts.   Kimberly Walters was evaluated in the [facility] clinic today and found to have oxygen saturation of 95% on room air but desaturated to 88% with exertion. Due to this, patient was directly admitted for COPD exacerbation complicated by acute hypoxic respiratory failure.   Meds:  No current facility-administered medications on file prior to encounter.   Current Outpatient Medications on File Prior to Encounter  Medication Sig Dispense Refill   acetaminophen (TYLENOL) 500 MG tablet Take 500 mg by mouth every 6 (six) hours as needed for moderate pain.     alendronate (FOSAMAX) 70 MG tablet  Take 1 tablet (70 mg total) by mouth every 7 (seven) days. Take with a full glass of water on an empty stomach. 4 tablet 11   cholecalciferol (VITAMIN D) 25 MCG (1000 UNIT) tablet TAKE 1 TABLET EVERY DAY (Patient taking differently: 1,000 Units daily.) 90 tablet 3   lisinopril (ZESTRIL) 20 MG tablet TAKE 1 TABLET EVERY DAY (Patient taking differently: Take 20 mg by mouth daily.) 90 tablet 3   pravastatin (PRAVACHOL) 10 MG tablet TAKE 1 TABLET EVERY DAY (Patient taking differently: Take 10 mg by mouth daily.) 90 tablet 3   XARELTO 20 MG TABS tablet TAKE 1 TABLET(20 MG) BY MOUTH DAILY WITH SUPPER (Patient taking differently: 20 mg daily with supper.) 30 tablet 3   bismuth subsalicylate (PEPTO-BISMOL) 262 MG/15ML suspension Take 30 mLs by mouth 4 (four) times daily as needed. (Patient not taking: Reported on 02/01/2021) 240 mL 2   ondansetron (ZOFRAN) 4 MG tablet Take 1 tablet (4 mg total) by mouth every 8 (eight) hours as needed for nausea or vomiting. 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    Gross hematuria  Hypertension    Mitral valve prolapse    Paroxysmal atrial fibrillation with RVR (HCC)    Pleural effusion on left    Post-thrombotic syndrome of left lower extremity    Primary hypercoagulable state ([place])    Pulmonary emboli ([place])    Pyelonephritis    Family History:  Family History  Problem Relation Age of Onset   Diabetes Mother    Heart disease Mother    Cancer Mother        Uterine   Diabetes Father    CAD Father    Heart attack Father    Hypothyroidism Sister    Melanoma Sister    Clotting disorder Sister    Cancer Sister        stomach   Diabetes Sister    Diabetes Sister    Hypertension Sister     Hypothyroidism Sister    Diabetes Sister    Hypertension Sister    Hypothyroidism Sister    Cancer Sister    Cancer Sister    Diabetes Daughter    Diabetes Son    Diabetes Son    Pancreatic cancer Nephew    Uterine cancer Niece    Ovarian cancer Other    Uterine cancer Other    Breast cancer Other    Colon cancer Neg Hx    Esophageal cancer Neg Hx    Social History:  Lives with her grand-daughter and two sons in [place] Current tobacco user. Currently smoking 3-4 cigarettes per day. She quit in 2021 temporarily. Overall, she has been smoking since the age of 78 (62 years total)  Denies any alcohol or drug use Independent in ADLs   Review of Systems: A complete ROS was negative except as per HPI.   Physical Exam: Blood pressure (!) 145/85, pulse 73, temperature 98.3 F (36.8 C), temperature source Oral, resp. rate 20, last menstrual period 06/21/1973, SpO2 93 %.  Physical Exam Vitals and nursing note reviewed.  Constitutional:      General: She is not in acute distress.    Appearance: She is normal weight.  HENT:     Head: Normocephalic and atraumatic.     Mouth/Throat:     Mouth: Mucous membranes are moist.     Pharynx: Oropharynx is clear.  Eyes:     Extraocular Movements: Extraocular movements intact.     Pupils: Pupils are equal, round, and reactive to light.  Cardiovascular:     Rate and Rhythm: Normal rate and regular rhythm.     Heart sounds: No murmur heard.   No gallop.  Pulmonary:     Effort: No tachypnea or accessory muscle usage.     Breath sounds: Wheezing (diffuse wheezing throughout) present. No decreased breath sounds, rhonchi or rales.  Abdominal:     General: Bowel sounds are normal. There is no distension.     Palpations: Abdomen is soft.     Tenderness: There is no abdominal tenderness. There is no guarding.  Musculoskeletal:     Right lower leg: No edema.     Left lower leg: No edema.  Skin:    General: Skin is warm and dry.   Neurological:     General: No focal deficit present.     Mental Status: She is alert and oriented to person, place, and time. Mental status is at baseline.  Psychiatric:        Mood and Affect: Mood normal.        Behavior: Behavior normal.  Thought Content: Thought content normal.        Judgment: Judgment normal.   EKG: personally reviewed my interpretation is: Sinus rhythm with PVCs   CXR: personally reviewed my interpretation is: Bibasilar and left mid lung field opacities noted. No pleural effusions.   Assessment & Plan by Problem: Principal Problem:   COPD with acute exacerbation [facility])  Kimberly Walters is a 71 y/o female with a PMHx of Factor V Leiden on Xarelto, COPD, HTN who is currently admitted for COPD exacerbation secondary to CAP.   # COPD Exacerbation 2/2 Community Acquired Pneumonia  Several day history of increasing SOB and new cough in the setting of CXR findings consistent with multi-focal pneumonia. On examination, diffuse wheezing present. No prior history of COPD exacerbation but this is a relatively new diagnosis for the patient. Diagnosis is presumed in the setting of consistent clinical picture and diffuse emphysematous changes on CT imaging - no PFTS available. At rest, no hypoxia noted at this time, but will need to obtain ambulatory saturations.   - Continuous pulse oximetry - Antimicrobial therapy with Azithromycin and Ceftriaxone, Day 1/5 - Prednisone 40 mg daily, Day 1/5 - Duonebs q6h  - Urinary strep and legionella antigen pending - Procalcitonin pending   # Dyspnea on Exertion  Patient endorses several week history of DOE that is worsening. I suspect current symptoms are secondary to CAP but that does not necessarily explain her prolonged symptoms. Differential includes progressive COPD in the setting of continued tobacco use versus pulmonary HTN due to known chronic pulmonary emboli. Given high PVC burden noted on telemetry, will need to rule  out HF as well.   - Telemetry monitoring - TTE ordered  # Factor V Leiden  - Continue home Xarelto   # Hypertension  - Continue home Lisinopril   Dispo: Admit patient to Inpatient with expected length of stay greater than 2 midnights.  Signed: Dr. Jose Persia Internal Medicine PGY-3  Pager: [phone] After 5pm on weekdays and 1pm on weekends: On Call pager [phone]  02/01/2021, 10:25 PM

## 2021-02-01 NOTE — Patient Instructions (Signed)
You will receive a call from the hospital upon a bed opening.

## 2021-02-01 NOTE — Progress Notes (Signed)
Office Visit   Patient ID: Kimberly Walters, female    DOB: [birth year], 71 y.o.   MRN: [id number]   PCP: Farrel Gordon, DO   Subjective:  CC: URI (Cough and shortness of breath)   Kimberly Walters is a 71 y.o. year old female who presents for the above medical condition(s). Please refer to problem based charting for assessment and plan.    Objective:   BP (!) 144/89 (BP Location: Left Arm, Patient Position: Sitting, Cuff Size: Normal)    Pulse 87    Temp 98.2 F (36.8 C) (Oral)    Wt 154 lb 8 oz (70.1 kg)    LMP 06/21/1973    SpO2 (!) 88% Comment: with ambulation   BMI 26.52 kg/m  General: Tired appearing female in no acute distress HEENT: no conjunctival injection. Dry MM Cardiac: Heart regular rate and rhythm.  No lower extremity edema.  No JVD Pulm: No accessory muscle use at rest although she does exhibit signs of shortness of breath with speaking.  Diminished lung sounds throughout L>R. No wheezing GI: abd soft, non-tender, non-distended Skin: warm and dry, no rash Assessment & Plan:   Problem List Items Addressed This Visit     Acute hypoxic respiratory failure due to Multifocal pneumonia    Patient presents with 4-day history of progressive shortness of breath associated with white mucus production.  Denies fevers or chills.  Endorses orthopnea and general malaise over the same period of time.  Denies any known recent sick contacts. O2 saturations at rest were 97% however dropped to 88% with ambulation.  Chest x-ray report suggestive of multifocal infectious process. No leukocytosis, influenza a, B and COVID-19 negative. - Discussed outpatient management versus admission with her.  Due to her worsening shortness of breath and fatigue/weakness, she preferred admission.  I think this is reasonable considering she may require additional work-up for this chest pain she describes (see separate problem). Directly admitted. She will follow up in the clinic after discharge.  - Will defer  antibiotic initiation to inpatient team        Atypical chest pain    She describes 2 separate incidences of chest pain over the past week.  The first episode occurred while attempting to get into her son's pickup truck.  Chest pain was pressure-like located substernally.  Symptoms resolved within 5 to 10 minutes of rest.  The next episode occurred when she went to stand after sitting watching TV.  This episode lasted about 10 to 15 minutes after she sat back down.  She is also endorsed orthopnea over the past 1 to 2 weeks. I reviewed her chart and it appears that she has reported chest pain in the past.  She was recently admitted and was found to have H. pylori gastritis.  This has since been treated and she reports that this pain is different than what she experienced this week. Cardiovascular risk factors include hypertension, hyperlipidemia, tobacco use, age. Last echocardiogram was performed in 2021 which did not show any regional wall abnormalities.  No prior ischemic eval has been performed. She was not actively experiencing chest pain in the office.  No acute ischemic changes appreciated on EKG here.  PVC was noted. She may benefit from additional ischemic evaluation.  Pain could be related to cardiovascular disease versus pneumonia. - Would benefit from echocardiogram this admission - Additional risk factor mitigation including smoking cessation encouraged        She will need post hospital follow up  after discharge.  Pt discussed with Dr. Marcille Buffy, MD Internal Medicine Resident PGY-3 [facility] 02/02/2021 3:24 PM

## 2021-02-02 ENCOUNTER — Inpatient Hospital Stay (HOSPITAL_COMMUNITY): Payer: Medicare HMO

## 2021-02-02 ENCOUNTER — Encounter (HOSPITAL_COMMUNITY): Payer: Self-pay | Admitting: Internal Medicine

## 2021-02-02 ENCOUNTER — Encounter: Payer: Self-pay | Admitting: Internal Medicine

## 2021-02-02 DIAGNOSIS — J441 Chronic obstructive pulmonary disease with (acute) exacerbation: Secondary | ICD-10-CM

## 2021-02-02 DIAGNOSIS — J189 Pneumonia, unspecified organism: Secondary | ICD-10-CM | POA: Insufficient documentation

## 2021-02-02 DIAGNOSIS — R0609 Other forms of dyspnea: Secondary | ICD-10-CM | POA: Diagnosis not present

## 2021-02-02 LAB — CBC
HCT: 43.4 % (ref 36.0–46.0)
Hemoglobin: 14.4 g/dL (ref 12.0–15.0)
MCH: 30.9 pg (ref 26.0–34.0)
MCHC: 33.2 g/dL (ref 30.0–36.0)
MCV: 93.1 fL (ref 80.0–100.0)
Platelets: 241 10*3/uL (ref 150–400)
RBC: 4.66 MIL/uL (ref 3.87–5.11)
RDW: 12.6 % (ref 11.5–15.5)
WBC: 12.7 10*3/uL — ABNORMAL HIGH (ref 4.0–10.5)
nRBC: 0 % (ref 0.0–0.2)

## 2021-02-02 LAB — BASIC METABOLIC PANEL
Anion gap: 7 (ref 5–15)
BUN: 14 mg/dL (ref 8–23)
CO2: 27 mmol/L (ref 22–32)
Calcium: 8.8 mg/dL — ABNORMAL LOW (ref 8.9–10.3)
Chloride: 105 mmol/L (ref 98–111)
Creatinine, Ser: 0.91 mg/dL (ref 0.44–1.00)
GFR, Estimated: >60 mL/min (ref >60)
Glucose, Bld: 164 mg/dL — ABNORMAL HIGH (ref 70–99)
Potassium: 4.1 mmol/L (ref 3.5–5.1)
Sodium: 139 mmol/L (ref 135–145)

## 2021-02-02 LAB — ECHOCARDIOGRAM COMPLETE BUBBLE STUDY
AR max vel: 2.09 cm2
AV Peak grad: 7.5 mmHg
Ao pk vel: 1.37 m/s
Area-P 1/2: 3.03 cm2
S' Lateral: 3.6 cm

## 2021-02-02 LAB — PROCALCITONIN: Procalcitonin: <0.1 ng/mL

## 2021-02-02 LAB — STREP PNEUMONIAE URINARY ANTIGEN: Strep Pneumo Urinary Antigen: NEGATIVE

## 2021-02-02 MED ORDER — SODIUM CHLORIDE 0.9 % IV SOLN: 2.0000 g | INTRAVENOUS | Status: DC

## 2021-02-02 MED ORDER — AZITHROMYCIN 250 MG PO TABS
250.0000 mg | ORAL_TABLET | Freq: Every day | ORAL | Status: DC
  Administered 2021-02-03: 250 mg via ORAL
  Filled 2021-02-02: qty 1

## 2021-02-02 NOTE — Discharge Instructions (Signed)
Information on my medicine - XARELTO (rivaroxaban)  This medication education was reviewed with me or my healthcare representative as part of my discharge preparation.  The pharmacist that spoke with me during my hospital stay was:  Onnie Boer, [place]? Xarelto was prescribed to treat blood clots that may have been found in the veins of your legs (deep vein thrombosis) or in your lungs (pulmonary embolism) and to reduce the risk of them occurring again.  What do you need to know about Xarelto? Continue Xarelto 20 mg tablet taken ONCE A DAY with your evening meal.  DO NOT stop taking Xarelto without talking to the health care provider who prescribed the medication.  Refill your prescription for 20 mg tablets before you run out.  After discharge, you should have regular check-up appointments with your healthcare provider that is prescribing your Xarelto.  In the future your dose may need to be changed if your kidney function changes by a significant amount.  What do you do if you miss a dose? If you are taking Xarelto TWICE DAILY and you miss a dose, take it as soon as you remember. You may take two 15 mg tablets (total 30 mg) at the same time then resume your regularly scheduled 15 mg twice daily the next day.  If you are taking Xarelto ONCE DAILY and you miss a dose, take it as soon as you remember on the same day then continue your regularly scheduled once daily regimen the next day. Do not take two doses of Xarelto at the same time.   Important Safety Information Xarelto is a blood thinner medicine that can cause bleeding. You should call your healthcare provider right away if you experience any of the following: Bleeding from an injury or your nose that does not stop. Unusual colored urine (red or dark brown) or unusual colored stools (red or black). Unusual bruising for unknown reasons. A serious fall or if you hit your head (even if there is  no bleeding).  Some medicines may interact with Xarelto and might increase your risk of bleeding while on Xarelto. To help avoid this, consult your healthcare provider or pharmacist prior to using any new prescription or non-prescription medications, including herbals, vitamins, non-steroidal anti-inflammatory drugs (NSAIDs) and supplements.  This website has more information on Xarelto: [email or web address]

## 2021-02-02 NOTE — Progress Notes (Signed)
SATURATION QUALIFICATIONS: (This note is used to comply with regulatory documentation for home oxygen)  Patient Saturations on Room Air at Rest = 90%  Patient Saturations on Room Air while Ambulating = 84%  Patient Saturations on 2 Liters of oxygen while Ambulating = 92%  Please briefly explain why patient needs home oxygen: N/A

## 2021-02-02 NOTE — Progress Notes (Incomplete)
Echocardiogram 2D Echocardiogram has been performed.  Kimberly Walters 02/02/2021, 9:41 AM

## 2021-02-02 NOTE — Progress Notes (Signed)
Mobility Specialist Progress Note   02/02/21 1100  Mobility  Activity Ambulated in hall  Level of Assistance Contact guard assist, steadying assist  Assistive Device None  Distance Ambulated (ft) 185 ft  Mobility Ambulated with assistance in hallway  Mobility Response Tolerated fair  Mobility performed by Mobility specialist  $Mobility charge 1 Mobility   Received pt on EOB w/ no symptoms in prep for O2 saturation qualification. Mod I throughout transitions from supine <> EOB <> standing w/ a slight drop in O2 >88% SpO2. While ambulating pt had excessive coughing spells needing x2 standing rest breaks to gather and dropping to 84% SpO2. Per advisement of RN applied 2L of O2 and pt resat to 92% SpO2. Returned back to EOB w/ no further problems and call bell within reach. RN notified.  Pre Mobility: 84 HR, 90% SpO2 on RA During Mobility: 112 HR, 84% SpO2 on RA Post Mobility: 90 HR, 94% SpO2 on 2L   Holland Falling Mobility Specialist Phone Number [phone]

## 2021-02-02 NOTE — Assessment & Plan Note (Addendum)
Patient presents with 4-day history of progressive shortness of breath associated with white mucus production.  Denies fevers or chills.  Endorses orthopnea and general malaise over the same period of time.  Denies any known recent sick contacts. O2 saturations at rest were 97% however dropped to 88% with ambulation.  Chest x-ray report suggestive of multifocal infectious process. No leukocytosis, influenza a, B and COVID-19 negative. - Discussed outpatient management versus admission with her.  Due to her worsening shortness of breath and fatigue/weakness, she preferred admission.  I think this is reasonable considering she may require additional work-up for this chest pain she describes (see separate problem). Directly admitted. She will follow up in the clinic after discharge.  - Will defer antibiotic initiation to inpatient team

## 2021-02-02 NOTE — Progress Notes (Signed)
Patient desats with coughing into high 80s, but recovers quickly.

## 2021-02-02 NOTE — TOC Initial Note (Signed)
Transition of Care [facility]) - Initial/Assessment Note    Patient Details  Name: Kimberly Walters MRN: [id number] Date of Birth: 1950/06/29  [facility] CM/SW Contact:    Sharin Mons, RN Phone Number: 02/02/2021, 8:31 AM  Clinical Narrative:                 Presents with SOB. Hx of  PMHx of Factor V Leiden on Xarelto, COPD, HTN   From home with son and his family. Supportive sister, Havery Moros ( lives close by). PTA independent with ADL's , no DME usage.  States has had no problems affording Rx meds co-pay, pt with concerns.  PCP: Marlou Sa, EMILY  No present needs noted  per NCM. TOC team will continue to monitor and assist with as presented.  Expected Discharge Plan: Home/Self Care Barriers to Discharge: Continued Medical Work up   Patient Goals and CMS Choice        Expected Discharge Plan and Services Expected Discharge Plan: Home/Self Care   Discharge Planning Services: CM Consult   Living arrangements for the past 2 months: Single Family Home                                      Prior Living Arrangements/Services Living arrangements for the past 2 months: Single Family Home Lives with:: Adult Children Patient language and need for interpreter reviewed:: Yes Do you feel safe going back to the place where you live?: Yes      Need for Family Participation in Patient Care: Yes (Comment) Care giver support system in place?: Yes (comment)   Criminal Activity/Legal Involvement Pertinent to Current Situation/Hospitalization: No - Comment as needed  Activities of Daily Living Home Assistive Devices/Equipment: None ADL Screening (condition at time of admission) Patient's cognitive ability adequate to safely complete daily activities?: Yes Is the patient deaf or have difficulty hearing?: Yes Does the patient have difficulty seeing, even when wearing glasses/contacts?: No Does the patient have difficulty concentrating, remembering, or making decisions?:  No Patient able to express need for assistance with ADLs?: No Does the patient have difficulty dressing or bathing?: No Independently performs ADLs?: Yes (appropriate for developmental age) Does the patient have difficulty walking or climbing stairs?: No Weakness of Legs: Right Weakness of Arms/Hands: Right  Permission Sought/Granted                  Emotional Assessment Appearance:: Appears stated age Attitude/Demeanor/Rapport: Engaged Affect (typically observed): Accepting Orientation: : Oriented to Situation, Oriented to  Time, Oriented to Place, Oriented to Self Alcohol / Substance Use: Not Applicable Psych Involvement: No (comment)  Admission diagnosis:  Acute respiratory failure with hypoxia (HCC) [J96.01] COPD with acute exacerbation (HCC) [J44.1] Patient Active Problem List   Diagnosis Date Noted   COPD with acute exacerbation ([place]) 02/01/2021   Atypical chest pain 40/97/3532   Helicobacter pylori gastritis 07/18/2020   Abdominal pain, chronic, epigastric 07/04/2020   Localized osteoporosis with current pathological fracture 02/18/2020   Asymptomatic varicose veins of both lower extremities 02/18/2020   Aortic atherosclerosis ([place]) 08/30/2019   Pleural effusion on left 07/30/2019   Anticoagulant-induced bleeding ([place]) 07/28/2019   Primary hypercoagulable state ([place]) 07/22/2019   Long term (current) use of anticoagulants 07/22/2019   Pulmonary embolism ([place]) 07/20/2019   Paroxysmal atrial fibrillation with RVR (HCC)    Emphysema, unspecified (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

## 2021-02-02 NOTE — Progress Notes (Addendum)
° °  Subjective:  O/N events- none  The patient was seen at bedside during rounds this AM. C/o new left shoulder pain. Unsure if her breathing has improved or not. She got shortness of breath last week, but Sunday/Monday is when it got worse. She feels like there is sputum in her chest, but nothing comes up. She is not on oxygen at home. There are no other complaints or concerns at this time.  Objective:  Vital signs in last 24 hours: Vitals:   02/02/21 0414 02/02/21 0417 02/02/21 0803 02/02/21 0818  BP: 124/70  (!) 147/81   Pulse: 77   75  Resp: (!) 22 20  (!) 22  Temp: 98.7 F (37.1 C)     TempSrc: Oral     SpO2: 93%   93%   General: NAD, nl appearance HE: Normocephalic, atraumatic, EOMI, Conjunctivae normal ENT: No congestion, no rhinorrhea, no exudate or erythema  Cardiovascular: Normal rate, regular rhythm. No murmurs, rubs, or gallops Pulmonary: Effort normal, breath sounds normal. No wheezes, rales, or rhonchi Abdominal: soft, nontender, bowel sounds present Musculoskeletal: no swelling, deformity, injury or tenderness in extremities Skin: Warm, dry, no bruising, erythema, or rash Psychiatric/Behavioral: normal mood, normal behavior     Assessment/Plan:  Principal Problem:   COPD with acute exacerbation [facility])  [name] is a 71 y/o female with a PMHx of Factor V Leiden on Xarelto, COPD, HTN who is currently admitted for COPD exacerbation secondary to CAP.   # COPD Exacerbation 2/2 to tobacco use Patient presented with increasing SOB and new cough. She reports a non-productive cough over the last few days.  She desaturated to 84% with ambulation and improved to >90% with 2L [place]. Pro-cal negative. Discussed with patient that CXR and lung sounds are not concerning for infection.  She does not have a formal diagnosis of COPD and diagnosis is relatively new from clinical picture and emphysematous changes on CT.  Patient states she would rather continue with breathing  treatments throughout today to see if she could go home without oxygen tomorrow.  She states that she discontinued smoking on Tuesday. - Continuous pulse oximetry - procal -ve, discontinued Ceftriaxone - Continue azithromycin  - Prednisone 40 mg daily, Day 1/5 - Duonebs q6h  - Urinary strep negative - Legionella antigen pending  # Dyspnea on Exertion  Patient endorses several week history of DOE that is worsening. TTE showed normal EF of 55-60% with normal LV function. - Telemetry monitoring   # Factor V Leiden  - Continue home Xarelto    # Hypertension  - Continue home Lisinopril   Prior to Admission Living Arrangement: Anticipated Discharge Location: Barriers to Discharge: Dispo: Anticipated discharge in approximately 1 day(s).   Kimberly Choudhry, DO 02/02/2021, 1:33 PM Pager: [phone] After 5pm on weekdays and 1pm on weekends: On Call pager [phone]

## 2021-02-02 NOTE — Plan of Care (Signed)
°  Problem: Education: Goal: Knowledge of disease or condition will improve Outcome: Not Progressing   Problem: Activity: Goal: Ability to tolerate increased activity will improve Outcome: Not Progressing   Problem: Respiratory: Goal: Ability to maintain a clear airway will improve Outcome: Not Progressing

## 2021-02-02 NOTE — Care Management Important Message (Signed)
Important Message  Patient Details  Name: Kimberly Walters MRN: [id number] Date of Birth: 21-Apr-1950   Medicare Important Message Given:  Yes     Hannah Beat 02/02/2021, 3:12 PM

## 2021-02-02 NOTE — Assessment & Plan Note (Addendum)
She describes 2 separate incidences of chest pain over the past week.  The first episode occurred while attempting to get into her son's pickup truck.  Chest pain was pressure-like located substernally.  Symptoms resolved within 5 to 10 minutes of rest.  The next episode occurred when she went to stand after sitting watching TV.  This episode lasted about 10 to 15 minutes after she sat back down.  She is also endorsed orthopnea over the past 1 to 2 weeks. I reviewed her chart and it appears that she has reported chest pain in the past.  She was recently admitted and was found to have H. pylori gastritis.  This has since been treated and she reports that this pain is different than what she experienced this week. Cardiovascular risk factors include hypertension, hyperlipidemia, tobacco use, age. Last echocardiogram was performed in 2021 which did not show any regional wall abnormalities.  No prior ischemic eval has been performed. She was not actively experiencing chest pain in the office.  No acute ischemic changes appreciated on EKG here.  PVC was noted. She may benefit from additional ischemic evaluation.  Pain could be related to cardiovascular disease versus pneumonia. - Would benefit from echocardiogram this admission - Additional risk factor mitigation including smoking cessation encouraged

## 2021-02-02 NOTE — Discharge Summary (Signed)
Name: Kimberly Walters MRN: [id number] DOB: 1950-09-17 71 y.o. PCP: Kimberly Gordon, DO  Date of Admission: 02/01/2021  6:34 PM Date of Discharge: 02/03/21 Attending Physician: Kimberly Walters, *  Discharge Diagnosis: 1. COPD exacerbation  2. Tobacco use disorder 3.  Hypertension 4.  Factor 5 leiden  Discharge Medications: Allergies as of 02/03/2021   No Known Allergies      Medication List     STOP taking these medications    ondansetron 4 MG tablet Commonly known as: ZOFRAN   Pepto-Bismol 262 MG/15ML suspension Generic drug: bismuth subsalicylate       TAKE these medications    acetaminophen 500 MG tablet Commonly known as: TYLENOL Take 500 mg by mouth every 6 (six) hours as needed for moderate pain.   albuterol 108 (90 Base) MCG/ACT inhaler Commonly known as: VENTOLIN HFA Inhale 2 puffs into the lungs every 6 (six) hours as needed for wheezing or shortness of breath.   alendronate 70 MG tablet Commonly known as: Fosamax Take 1 tablet (70 mg total) by mouth every 7 (seven) days. Take with a full glass of water on an empty stomach.   cholecalciferol 25 MCG (1000 UNIT) tablet Commonly known as: VITAMIN D TAKE 1 TABLET EVERY DAY What changed: how to take this   lisinopril 20 MG tablet Commonly known as: ZESTRIL TAKE 1 TABLET EVERY DAY   pravastatin 10 MG tablet Commonly known as: PRAVACHOL TAKE 1 TABLET EVERY DAY   predniSONE 20 MG tablet Commonly known as: DELTASONE Take 2 tablets (40 mg total) by mouth daily with breakfast. Start taking on: February 04, 2021   Spiriva Respimat 2.5 MCG/ACT Aers Generic drug: Tiotropium Bromide Monohydrate Inhale 2 puffs into the lungs daily.   Xarelto 20 MG Tabs tablet Generic drug: rivaroxaban TAKE 1 TABLET(20 MG) BY MOUTH DAILY WITH SUPPER What changed: See the new instructions.               Durable Medical Equipment  (From admission, onward)           Start     Ordered   02/03/21 1208  For  home use only DME oxygen  Once       Question Answer Comment  Length of Need 6 Months   Mode or (Route) Nasal cannula   Liters per Minute 2   Frequency Continuous (stationary and portable oxygen unit needed)   Oxygen delivery system Gas      02/03/21 1208   02/03/21 1202  DME Oxygen  Once       Question Answer Comment  Length of Need 6 Months   Mode or (Route) Nasal cannula   Liters per Minute 2   Oxygen delivery system Gas      02/03/21 1210            Disposition and follow-up:   [name] was discharged from [facility] in Stable condition.  At the hospital follow up visit please address:  COPD exacerbation Secondary to tobacco use.  No official diagnosis of COPD, only emphysematous change seen on CT chest. -Will need PFT -Reassess oxygen status.  She was discharged with 3 L supplemental oxygen -She was discharged with Spiriva Respimat and albuterol.  Tobacco use disorder Encourage cessation Offer resources if needed  2.  Labs / imaging needed at time of follow-up: NA  3.  Pending labs/ test needing follow-up: NA  Follow-up Appointments:  Follow-up Information     Kimberly Gordon, DO  Follow up.   Specialty: Internal Medicine Contact information: [place] [address] [place] [facility] Course by problem list: 1. COPD exacerbation  Patient was admitted for worsening dyspnea on exertion, wheezing and fatigue over the last few weeks but worsened significantly in the last several days.  She also endorses nonproductive cough.  Physical exam on admission reveals diffuse wheezing.  Chest x-ray shows haziness in the bilateral lower lungs without obvious consolidation.  She was started on prednisone.  Ceftriaxone azithromycin was started and narrowed to azithromycin after negative Pro-Cal and the lack consolidation on x-ray.  Patient does not have an official diagnosis of COPD.  No PFT was performed.   However CT chest showed emphysematous change.  Echocardiogram showed normal EF and LV function.  Patient's clinical presentation improved with steroids and azithromycin.  She satting well on room air but dropped to 82% on ambulation.  She will go home with 3 L supplemental oxygen and finish the course of prednisone.  She is discharged on Spiriva Respimat and albuterol as needed.  She will follow-up with internal medicine clinic in 1-2 weeks and will have PFT done.  Discharge Exam:   BP 135/77 (BP Location: Left Arm)    Pulse 91    Temp 97.9 F (36.6 C) (Oral)    Resp 16    Ht 5\' 4"  (1.626 m)    Wt 71.3 kg    LMP 06/21/1973    SpO2 94%    BMI 26.98 kg/m  Discharge exam:  Constitutional: well-appearing, sitting in bed with sister at bedside, in no acute distress HENT: normocephalic atraumatic, mucous membranes moist Eyes: conjunctiva non-erythematous Neck: supple Cardiovascular: regular rate and rhythm, no m/r/g Pulmonary/Chest: normal work of breathing on room air, lungs clear to auscultation bilaterally Abdominal: soft, non-tender, non-distended MSK: normal bulk and tone Neurological: alert & oriented x 3, 5/5 strength in bilateral upper and lower extremities, normal gait Skin: warm and dry Psych: normal mood and affect   Pertinent Labs, Studies, and Procedures:  CBC Latest Ref Rng & Units 02/03/2021 02/02/2021 02/01/2021  WBC 4.0 - 10.5 K/uL 10.3 12.7(H) 9.8  Hemoglobin 12.0 - 15.0 g/dL 13.9 14.4 15.1(H)  Hematocrit 36.0 - 46.0 % 41.2 43.4 46.1(H)  Platelets 150 - 400 K/uL 224 241 286    BMP Latest Ref Rng & Units 02/03/2021 02/02/2021 02/01/2021  Glucose 70 - 99 mg/dL 145(H) 164(H) 83  BUN 8 - 23 mg/dL 21 14 14   Creatinine 0.44 - 1.00 mg/dL 1.05(H) 0.91 0.93  BUN/Creat Ratio 12 - 28 - - -  Sodium 135 - 145 mmol/L 142 139 143  Potassium 3.5 - 5.1 mmol/L 3.8 4.1 5.8(H)  Chloride 98 - 111 mmol/L 102 105 104  CO2 22 - 32 mmol/L 25 27 30   Calcium 8.9 - 10.3 mg/dL 9.4 8.8(L) 9.6      Discharge Instructions: Discharge Instructions     Diet - low sodium heart healthy   Complete by: As directed    Discharge instructions   Complete by: As directed    [name],  You were recently admitted to [facility] for worsening of COPD likely due to tobacco use.   Continue taking your home medications with the following changes  He received 3 days of prednisone 40 mg in the hospital, you have 2 additional doses.  Please start using Spiriva Respimat inhaler once daily.  Can ask  your pharmacist for guidance on how to use inhaler or there are videos to help with the use of online.  I am also sending in an albuterol inhaler that you can use feel short of breath.  I agree with you that quitting smoking the best option for you to help with breathing. If you would like to try a medication to help with this please discuss at follow-up appointment with Internal Medicine Clinic.  You should seek further medical care if your breathing worsens.  I have sent a message to the [facility] Internal medicine clinic for you to be seen within one to two weeks for hospital follow-up.   Sincerely, Christiana Fuchs, DO   Increase activity slowly   Complete by: As directed        Signed: Christiana Fuchs, DO 02/03/2021, 2:29 PM   Pager: [phone]

## 2021-02-02 NOTE — Plan of Care (Signed)
°  Problem: Education: Goal: Knowledge of disease or condition will improve Outcome: Progressing Goal: Knowledge of the prescribed therapeutic regimen will improve Outcome: Progressing   Problem: Activity: Goal: Ability to tolerate increased activity will improve Outcome: Progressing Goal: Will verbalize the importance of balancing activity with adequate rest periods Outcome: Progressing   Problem: Respiratory: Goal: Ability to maintain adequate ventilation will improve Outcome: Progressing

## 2021-02-02 NOTE — Plan of Care (Signed)
  Problem: Education: Goal: Knowledge of disease or condition will improve Outcome: Progressing   Problem: Activity: Goal: Ability to tolerate increased activity will improve Outcome: Progressing   Problem: Respiratory: Goal: Ability to maintain a clear airway will improve Outcome: Progressing   

## 2021-02-03 DIAGNOSIS — J441 Chronic obstructive pulmonary disease with (acute) exacerbation: Secondary | ICD-10-CM | POA: Diagnosis not present

## 2021-02-03 LAB — BASIC METABOLIC PANEL
Anion gap: 15 (ref 5–15)
BUN: 21 mg/dL (ref 8–23)
CO2: 25 mmol/L (ref 22–32)
Calcium: 9.4 mg/dL (ref 8.9–10.3)
Chloride: 102 mmol/L (ref 98–111)
Creatinine, Ser: 1.05 mg/dL — ABNORMAL HIGH (ref 0.44–1.00)
GFR, Estimated: 57 mL/min — ABNORMAL LOW (ref >60)
Glucose, Bld: 145 mg/dL — ABNORMAL HIGH (ref 70–99)
Potassium: 3.8 mmol/L (ref 3.5–5.1)
Sodium: 142 mmol/L (ref 135–145)

## 2021-02-03 LAB — CBC
HCT: 41.2 % (ref 36.0–46.0)
Hemoglobin: 13.9 g/dL (ref 12.0–15.0)
MCH: 31.5 pg (ref 26.0–34.0)
MCHC: 33.7 g/dL (ref 30.0–36.0)
MCV: 93.4 fL (ref 80.0–100.0)
Platelets: 224 10*3/uL (ref 150–400)
RBC: 4.41 MIL/uL (ref 3.87–5.11)
RDW: 12.6 % (ref 11.5–15.5)
WBC: 10.3 10*3/uL (ref 4.0–10.5)
nRBC: 0 % (ref 0.0–0.2)

## 2021-02-03 MED ORDER — ALBUTEROL SULFATE (2.5 MG/3ML) 0.083% IN NEBU: 2.5000 mg | INHALATION_SOLUTION | RESPIRATORY_TRACT | Status: DC | PRN

## 2021-02-03 MED ORDER — IPRATROPIUM-ALBUTEROL 0.5-2.5 (3) MG/3ML IN SOLN
3.0000 mL | Freq: Four times a day (QID) | RESPIRATORY_TRACT | Status: DC
  Administered 2021-02-03: 3 mL via RESPIRATORY_TRACT
  Filled 2021-02-03: qty 3

## 2021-02-03 MED ORDER — ALBUTEROL SULFATE HFA 108 (90 BASE) MCG/ACT IN AERS: 2.0000 | INHALATION_SPRAY | Freq: Four times a day (QID) | RESPIRATORY_TRACT | 2 refills | Status: DC | PRN

## 2021-02-03 MED ORDER — PREDNISONE 20 MG PO TABS: 40.0000 mg | ORAL_TABLET | Freq: Every day | ORAL | 0 refills | Status: DC

## 2021-02-03 MED ORDER — SPIRIVA RESPIMAT 2.5 MCG/ACT IN AERS: 2.0000 | INHALATION_SPRAY | Freq: Every day | RESPIRATORY_TRACT | 0 refills | Status: DC

## 2021-02-03 MED ORDER — AZITHROMYCIN 250 MG PO TABS
250.0000 mg | ORAL_TABLET | Freq: Every day | ORAL | Status: DC
Start: 2021-02-04 — End: 2021-02-03

## 2021-02-03 MED ORDER — INCRUSE ELLIPTA 62.5 MCG/ACT IN AEPB: 1.0000 | INHALATION_SPRAY | Freq: Every day | RESPIRATORY_TRACT | 0 refills | Status: DC

## 2021-02-03 NOTE — Plan of Care (Signed)
°  Problem: Education: Goal: Knowledge of disease or condition will improve Outcome: Adequate for Discharge Goal: Knowledge of the prescribed therapeutic regimen will improve Outcome: Adequate for Discharge Goal: Individualized Educational Video(s) Outcome: Adequate for Discharge   Problem: Activity: Goal: Ability to tolerate increased activity will improve Outcome: Adequate for Discharge Goal: Will verbalize the importance of balancing activity with adequate rest periods Outcome: Adequate for Discharge   Problem: Respiratory: Goal: Ability to maintain a clear airway will improve Outcome: Adequate for Discharge Goal: Levels of oxygenation will improve Outcome: Adequate for Discharge Goal: Ability to maintain adequate ventilation will improve Outcome: Adequate for Discharge  Discharge instructions reviewed with patient.  These included, but were not limited to, the following:  discharge medications, reason for admission, follow-up appointments, when to call the MD, recommended activity, home oxygen education, etc.  Comprehension of instructions ascertained via use of "teach-back" technique.  Pt awaiting delivery of home oxygen from supplier.

## 2021-02-03 NOTE — TOC Transition Note (Signed)
Transition of Care [facility]) - CM/SW Discharge Note   Patient Details  Name: SOLENNE MANWARREN MRN: [id number] Date of Birth: 1950-02-25  Transition of Care [facility]) CM/SW Contact:  Bartholomew Crews, RN Phone Number: [phone] 02/03/2021, 12:11 PM   Clinical Narrative:     Spoke with patient on her cell phone to discuss post acute transition home. Patient verbalized understanding of need for home oxygen. Offered choice of providers - no preference. Referral to [facility] - portable oxygen to be delivered to her room. Patient requests that [employer] reach out to her son, Derald Macleod, for billing needs. Patient has transportation home. Demographics verified. No further TOC needs identified at this time.   Final next level of care: Home/Self Care Barriers to Discharge: No Barriers Identified   Patient Goals and CMS Choice Patient states their goals for this hospitalization and ongoing recovery are:: home with sons CMS Medicare.gov Compare Post Acute Care list provided to:: Patient Choice offered to / list presented to : Patient  Discharge Placement                       Discharge Plan and Services   Discharge Planning Services: CM Consult            DME Arranged: Oxygen DME Agency: [facility] Date DME Agency Contacted: 02/03/21 Time DME Agency Contacted: [id number] Representative spoke with at DME Agency: [place]: NA [place] Agency: NA        Social Determinants of Health ([place]) Interventions     Readmission Risk Interventions No flowsheet data found.

## 2021-02-03 NOTE — Progress Notes (Signed)
SATURATION QUALIFICATIONS: (This note is used to comply with regulatory documentation for home oxygen)  Patient Saturations on Room Air at Rest = 92%  Patient Saturations on Room Air while Ambulating = 83%  Patient Saturations on 2 Liters of oxygen while Ambulating = 90%  Please briefly explain why patient needs home oxygen: Patient experiencing desaturation and shortness of breath with ambulation, and increased heart rate of 110.  Massie Bougie, RN

## 2021-02-04 LAB — LEGIONELLA PNEUMOPHILA SEROGP 1 UR AG: L. pneumophila Serogp 1 Ur Ag: NEGATIVE

## 2021-02-06 ENCOUNTER — Telehealth: Payer: Self-pay

## 2021-02-06 ENCOUNTER — Telehealth: Payer: Self-pay | Admitting: Internal Medicine

## 2021-02-06 NOTE — Telephone Encounter (Signed)
Call from pt who stated the pharmacy has incruse ellipta available but needs to be approved by the doctor. PA request form given to [facility] which may take 1 -3 days; pt informed. Pt stated she does not want a refill on Spiriva b/c it cost  her $45.00 for the 2 days supply and she's currently doing well but she will call back if needed.

## 2021-02-06 NOTE — Telephone Encounter (Signed)
Return pt's call.she discharged recently from the hospital. Stated Spiriva  was ordered instead of Ellipta which was not available in which she only received a 2 day supply of Spiriva. She used it Sunday and Monday.Stated she called the pharmacy this morning and was told Ellipta should be ready today - she wanted to know what to do if it's not. Told her to call is back.  She also stated she discharged home on 2L of oxygen. She wanted to know if she could go down to 1 L - she stated after taking a shower her O2 sat was 91- 93%; she has not been sob nor having any "coughing spells". I told her if she turns it down to 1L, see how she does, monitor her O2 sats and turn it back up 2 L if she starts feeling sob - stated she understands. She also mentioned about driving  with the oxygen but stated she will wait until her appt on 1/23.

## 2021-02-06 NOTE — Telephone Encounter (Signed)
PA  for pt ( INCRUSE ELLIPTA 62.5 MCG ORAL INH 30 )  came through via fax was submitted through cover my meds  with office notes and discharge notes ...... awaiting approval or denial      Update :    Your information has been submitted to [facility]. [employer] will review the request and will issue a decision, typically within 3-7 days from your submission. You can check the updated outcome later by reopening this request.

## 2021-02-06 NOTE — Telephone Encounter (Signed)
Agree, thank you. AnnMaria--please let me know if the form needs my signature. Thanks

## 2021-02-06 NOTE — Telephone Encounter (Signed)
Patient requesting a call back.  [facility]U appt sch 02/12/2021 per Dr. Howie Ill for the following:  Pt requesting someone to call bak as she has some questions after being d/c.  Name: Kimberly, Walters MRN: [id number]  Date: 02/12/2021 Status: Sch  Time: 9:45 AM Length: 30  Visit Type: OPEN ESTABLISHED [726] Copay: $0.00  Provider: Mitzi Hansen, MD

## 2021-02-07 NOTE — Progress Notes (Signed)
Internal Medicine Clinic Attending  Case discussed with Dr. Christian at the time of the visit.  We reviewed the resident's history and exam and pertinent patient test results.  I agree with the assessment, diagnosis, and plan of care documented in the resident's note.    

## 2021-02-07 NOTE — Telephone Encounter (Signed)
DECISION :   Denied today Your drug was reviewed for more than one issue. This drug is not on your preferred drug list (formulary) and has a safety concern (duplicate drug therapy). The formulary exception is approved through 01/20/2022. However, you asked for INCRUSE ELLIPTA 62.5 MCG INH when you are already taking Spiriva Respimat 2.5 mcg/actuation solution for inhalation. Before your drug can be fully approved, your provider needs to tell [employer] why you need two similar drugs or if you will be stopping one of the drugs.   ( COPY PLACED TO BE SCANNED TO CHART ALSO )

## 2021-02-11 NOTE — Assessment & Plan Note (Addendum)
Admitted 1/12-1/14 for COPD exacerbation. She presents for hospital follow up today. See HPI for details. Discussed COPD diagnosis, evaluation, and treatment, including smoking cessation. She has not smoked a cigarette since her LOV with me 1/12. I congratulated her on this. She is not interested in pharmacologic assistance.  I had her demonstrate spiriva use which appeared appropriate. I showed her the meter on the side of the inhaler which shows that it is still full so she has not run out.  She has recovered well from [place]. She was able to ambulate on RA with O2 saturations ranging 92-96%.  COPD is a clinical diagnosis thus far. No prior PFTs although imaging has shown emphysematous changes on prior CTs. No eosinophilia on diff.   Will send her for PFTs once recovered from current exacerbation.   Continue spiriva and prn albuterol.   Follow up with PCP after completion of PFTs to review and re-evaluate symptoms.

## 2021-02-11 NOTE — Progress Notes (Signed)
° °  Office Visit   Patient ID: Kimberly Walters, female    DOB: 1950/02/03, 71 y.o.   MRN: [id number]   PCP: Kimberly Gordon, DO   Subjective:   Kimberly Walters is a 71 y.o. year old female  who presents for hospital follow up. Admitted 1/12-1/14/23  I had seen her in the clinic on 1/12 for shortness of breath. She was noted to become hypoxic with ambulation in the office and was subsequently directly admitted. Workup in the hospital revealed a COPD exacerbation. She was treated with steroids and azithromycin. She remained hypoxic with ambulation, with O2 saturation noted to be 82% so was discharged on 3L supplemental oxygen on 1/14. She was additionally discharged on spiriva and albuterol.  Today, reports remarkable improvement in breathing since discharge. She completed course of prednisone. She thought that the spiriva was only temporary as well so has not been using it. She has not needed the albuterol inhaler. She notes that an incruse inhaler was sent to the pharmacy however did not pick it up due to it costing $400. She brought her Spiriva inhaler with to today's appointment. She believes that it is empty.Dr. She notes that she has not smoked a cigarette since her LOV with me.   Objective:   BP 131/76    Pulse 79    Temp 98 F (36.7 C) (Oral)    Resp (!) 24    Ht 5\' 4"  (1.626 m)    Wt 159 lb 6.4 oz (72.3 kg)    LMP 06/21/1973    SpO2 97% Comment: room air   BMI 27.36 kg/m   General: well appearing, no distress Cardiac: RRR Pulm: ambulatory O2 saturations on room air 92-96%. Breathing comfortably on room air. Lung sounds diminished throughout, no wheezes.  Assessment & Plan:   Problem List Items Addressed This Visit       Respiratory   COPD (chronic obstructive pulmonary disease) with emphysema ([place]) - Primary    Admitted 1/12-1/14 for COPD exacerbation. She presents for hospital follow up today. See HPI for details. Discussed COPD diagnosis, evaluation, and treatment, including smoking  cessation. She has not smoked a cigarette since her LOV with me 1/12. I congratulated her on this. She is not interested in pharmacologic assistance.  I had her demonstrate spiriva use which appeared appropriate. I showed her the meter on the side of the inhaler which shows that it is still full so she has not run out.  She has recovered well from [name]. She was able to ambulate on RA with O2 saturations ranging 92-96%.  COPD is a clinical diagnosis thus far. No prior PFTs although imaging has shown emphysematous changes on prior CTs. No eosinophilia on diff.  Will send her for PFTs once recovered from current exacerbation.  Continue spiriva and prn albuterol.  Follow up with PCP after completion of PFTs to review and re-evaluate symptoms.       Relevant Orders   Pulmonary function test    Return for routine follow up with Dr. Marlou Sa at next available appointment..  Pt discussed with Dr. Adolm Joseph, MD Internal Medicine Resident PGY-3 [facility] 02/13/2021 9:37 AM

## 2021-02-12 ENCOUNTER — Encounter: Payer: Self-pay | Admitting: Internal Medicine

## 2021-02-12 ENCOUNTER — Ambulatory Visit (INDEPENDENT_AMBULATORY_CARE_PROVIDER_SITE_OTHER): Payer: Medicare HMO | Admitting: Internal Medicine

## 2021-02-12 ENCOUNTER — Other Ambulatory Visit: Payer: Self-pay

## 2021-02-12 VITALS — BP 131/76 | HR 79 | Temp 98.0°F | Resp 24 | Ht 64.0 in | Wt 159.4 lb

## 2021-02-12 DIAGNOSIS — J439 Emphysema, unspecified: Secondary | ICD-10-CM | POA: Diagnosis not present

## 2021-02-15 NOTE — Progress Notes (Signed)
Internal Medicine Clinic Attending  Case discussed with Dr. Christian  At the time of the visit.  We reviewed the resident's history and exam and pertinent patient test results.  I agree with the assessment, diagnosis, and plan of care documented in the resident's note.  

## 2021-03-02 ENCOUNTER — Other Ambulatory Visit: Payer: Self-pay | Admitting: Internal Medicine

## 2021-03-05 ENCOUNTER — Other Ambulatory Visit: Payer: Self-pay | Admitting: Internal Medicine

## 2021-03-05 LAB — SARS CORONAVIRUS 2 (TAT 6-24 HRS): SARS Coronavirus 2: NEGATIVE

## 2021-03-06 DIAGNOSIS — J439 Emphysema, unspecified: Secondary | ICD-10-CM | POA: Diagnosis not present

## 2021-03-06 DIAGNOSIS — J441 Chronic obstructive pulmonary disease with (acute) exacerbation: Secondary | ICD-10-CM | POA: Diagnosis not present

## 2021-03-08 ENCOUNTER — Other Ambulatory Visit: Payer: Self-pay

## 2021-03-08 ENCOUNTER — Ambulatory Visit (HOSPITAL_COMMUNITY)
Admission: RE | Admit: 2021-03-08 | Discharge: 2021-03-08 | Disposition: A | Discharge status: Home / Self Care | Payer: Medicare HMO | Source: Ambulatory Visit | Attending: Internal Medicine | Admitting: Internal Medicine

## 2021-03-08 DIAGNOSIS — J439 Emphysema, unspecified: Secondary | ICD-10-CM | POA: Diagnosis not present

## 2021-03-08 LAB — PULMONARY FUNCTION TEST
DL/VA % pred: 67 %
DL/VA: 2.79 ml/min/mmHg/L
DLCO unc % pred: 57 %
DLCO unc: 11.13 ml/min/mmHg
FEF 25-75 Post: 0.44 L/sec
FEF 25-75 Pre: 0.6 L/sec
FEF2575-%Change-Post: -26 %
FEF2575-%Pred-Post: 23 %
FEF2575-%Pred-Pre: 32 %
FEV1-%Change-Post: -2 %
FEV1-%Pred-Post: 53 %
FEV1-%Pred-Pre: 54 %
FEV1-Post: 1.19 L
FEV1-Pre: 1.22 L
FEV1FVC-%Change-Post: -1 %
FEV1FVC-%Pred-Pre: 65 %
FEV6-%Change-Post: -6 %
FEV6-%Pred-Post: 77 %
FEV6-%Pred-Pre: 83 %
FEV6-Post: 2.2 L
FEV6-Pre: 2.35 L
FEV6FVC-%Change-Post: -5 %
FEV6FVC-%Pred-Post: 94 %
FEV6FVC-%Pred-Pre: 100 %
FVC-%Change-Post: -1 %
FVC-%Pred-Post: 82 %
FVC-%Pred-Pre: 83 %
FVC-Post: 2.44 L
FVC-Pre: 2.46 L
Post FEV1/FVC ratio: 49 %
Post FEV6/FVC ratio: 90 %
Pre FEV1/FVC ratio: 50 %
Pre FEV6/FVC Ratio: 96 %
RV % pred: 181 %
RV: 4.02 L
TLC % pred: 125 %
TLC: 6.34 L

## 2021-03-08 MED ORDER — ALBUTEROL SULFATE (2.5 MG/3ML) 0.083% IN NEBU
2.5000 mg | INHALATION_SOLUTION | Freq: Once | RESPIRATORY_TRACT | Status: AC
  Administered 2021-03-08: 2.5 mg via RESPIRATORY_TRACT

## 2021-03-13 ENCOUNTER — Ambulatory Visit (INDEPENDENT_AMBULATORY_CARE_PROVIDER_SITE_OTHER): Payer: Medicare HMO | Admitting: Student

## 2021-03-13 DIAGNOSIS — J439 Emphysema, unspecified: Secondary | ICD-10-CM | POA: Diagnosis not present

## 2021-03-13 MED ORDER — BUDESONIDE-FORMOTEROL FUMARATE 80-4.5 MCG/ACT IN AERO: 2.0000 | INHALATION_SPRAY | Freq: Every day | RESPIRATORY_TRACT | 12 refills | Status: DC

## 2021-03-13 NOTE — Progress Notes (Signed)
°  [facility] Internal Medicine Residency Telephone Encounter Continuity Care Appointment  HPI:  This telephone encounter was created for [name] on 03/13/2021 for the following purpose/cc: body aches, congestion, subjective fever Spoke with Ms. Denman via telephone. She states that she was doing well up until yesterday morning. She woke up around 4:30AM and had some mild body aches. Around afternoon, she developed some congestion and her body aches persisted. She took some mucinex along with using a Neti pot and her congestion improved. Yesterday evening, she developed a subjective fever (did not check temperature) and took a tylenol which helped with her fever and body aches. She also took a mucinex around bedtime. Today, she states that she feels better than yesterday. She still has some congestion and body aches, but did not have a fever when she checked her temperature. She denies any headaches, N/V, chills, cough (not different than baseline from COPD), SHOB, increased sputum production, chest pain, changes in urination, changes in bowel movements. Of note, she took 2 at-home COVID tests and both were negative. She has not had a flu shot this year. States that her symptoms do not feel nearly as bad as when she was recently hospitalized for COPD exacerbation. She has been using her spiriva inhaler as directed and has not needed to use her rescue inhaler since discharge. She recently obtained PFT testing showing findings consistent with COPD GOLD 3 with emphysema. Discussed findings with her and discussed benefits of adding symbicort therapy along with pulmonary rehab to better control COPD symptoms and reduce risk for hospitalization from COPD. She is in agreement with plan. Symptoms are low-risk and thus likely would not benefit from influenza testing at this time.   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   Assessment / Plan / Recommendations:  Please see A&P under problem oriented charting for assessment of the patient's acute and chronic medical conditions.  As always, pt is advised that if symptoms worsen or new symptoms arise, they should go to an urgent care facility or to to ER for further evaluation.   Consent and Medical Decision Making:  Patient discussed with Dr. Evette Doffing This is a telephone encounter between Kimberly Walters and Virl Axe on 03/13/2021 for URI symptoms. The visit was conducted with the patient located at home and Virl Axe at [facility]. The patient's identity was confirmed using their DOB and current address. The patient has consented to being evaluated through a telephone encounter and understands the associated risks (an examination cannot be done and the patient may need to come in for an appointment) / benefits (allows the patient to remain at home, decreasing exposure to coronavirus). I personally spent 16 minutes on medical discussion.

## 2021-03-13 NOTE — Assessment & Plan Note (Addendum)
Patient with previously undiagnosed COPD with exacerbations requiring hospitalization (most recent in January 2023). She was discharged from hospitalization with spiriva, rescue albuterol inhaler, and supplemental O2 (2L). She was advised to obtain PFTs as an outpatient to obtain formal diagnosis in order to adjust management. States that since her hospitalization, she has been doing well with her daily spiriva. She has not been using her supplemental O2 even with ambulation and has not needed to use her rescue inhaler. States that she has not had any difficulties with her daily activities. She does note that she obtained her PFT testing 5 days ago.  I reviewed these results with her via telephone encounter. PFTs consistent with moderately severe fixed obstructive airway disease (emphysematous type) along with overinflation and airtrapping. Her COPD Gold stage is likely closer to Stage 3 (FEV1 54%). Discussed benefits of adding symbicort and pulmonary rehab to better manage COPD and reduce risk of future hospitalizations. She is in agreement with plan. Also advised her to use supplemental O2, especially with ambulation.   She has been doing well with tobacco cessation. States that she has not smoked since she was discharged in January, which I congratulated her on.  Plan: -continue spiriva and supplemental O2 -added low-dose symbicort -ordered pulmonary rehab

## 2021-03-14 ENCOUNTER — Telehealth (HOSPITAL_COMMUNITY): Payer: Self-pay

## 2021-03-14 NOTE — Telephone Encounter (Signed)
Called patient to see if she is interested in the Pulmonary Rehab Program. Patient expressed interest. Explained scheduling process and went over insurance, patient verbalized understanding. Someone from our pulmonary rehab staff will contact pt at a later time. 

## 2021-03-14 NOTE — Telephone Encounter (Signed)
Pt insurance is active and benefits verified through [place] $10, DED 0/0 met, out of pocket $3,400/$590 met, co-insurance 0%. no pre-authorization required, Reg/[employer] 03/14/2021'@1' :33pm, REF# [id number]  Will contact patient to see if she is interested in the Pulmonary Rehab Program. If interested, patient will need to complete follow up appt. Once completed, patient will be contacted for scheduling upon review by the RN Navigator.

## 2021-03-15 ENCOUNTER — Encounter (HOSPITAL_COMMUNITY): Payer: Self-pay | Admitting: *Deleted

## 2021-03-15 NOTE — Progress Notes (Signed)
Internal Medicine Clinic Attending  Case discussed with Dr. Jinwala  At the time of the visit.  We reviewed the resident's history and pertinent patient test results.  I agree with the assessment, diagnosis, and plan of care documented in the resident's note.  

## 2021-03-15 NOTE — Progress Notes (Signed)
Received referral from Dr. Mallie Mussel internal medicine for this pt to participate in pulmonary rehab with the diagnosis of COPD Stage 3  Gold per progress post hospitalization 1/12-1/14 follow up tele visit on 2/21.  Pt with full PFT on 2/16 FEV1/FVC 49 FEV1 post BD ?COPD stage 2.  Reviewed progress note as well as admission notes.  Pt with Covid Risk Score - 4. Pt appropriate for scheduling for Pulmonary rehab.  Will forward to support staff for scheduling with pt consent. [name], BSN Cardiac and Training and development officer

## 2021-03-22 ENCOUNTER — Telehealth (HOSPITAL_COMMUNITY): Payer: Self-pay | Admitting: Internal Medicine

## 2021-04-02 ENCOUNTER — Ambulatory Visit (INDEPENDENT_AMBULATORY_CARE_PROVIDER_SITE_OTHER): Payer: Medicare HMO | Admitting: Student

## 2021-04-02 DIAGNOSIS — J069 Acute upper respiratory infection, unspecified: Secondary | ICD-10-CM | POA: Diagnosis not present

## 2021-04-02 DIAGNOSIS — J439 Emphysema, unspecified: Secondary | ICD-10-CM | POA: Diagnosis not present

## 2021-04-02 HISTORY — DX: Acute upper respiratory infection, unspecified: J06.9

## 2021-04-02 MED ORDER — SPIRIVA RESPIMAT 2.5 MCG/ACT IN AERS: 2.0000 | INHALATION_SPRAY | Freq: Every day | RESPIRATORY_TRACT | 2 refills | Status: DC

## 2021-04-02 NOTE — Assessment & Plan Note (Signed)
Patient is presenting via telephone today to discuss sore throat and stuffy nose over the last few days. She reports symptoms started on Friday and worsened on Saturday. Mentions her nose was extremely stopped up and had to get medications for this.

## 2021-04-02 NOTE — Progress Notes (Unsigned)
°  [facility] Internal Medicine Residency Telephone Encounter Continuity Care Appointment  HPI:  This telephone encounter was created for [name] on 04/02/2021 for the following purpose/cc sore throat, stuffy nose.   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  As always, pt is advised that if symptoms worsen or new symptoms arise, they should go to an urgent care facility or to to ER for further evaluation.   Consent and Medical Decision Making:  Patient discussed with Dr.  Cain Sieve This is a telephone encounter between [place] on 04/02/2021 for sore throat, stuffy nose. The visit was conducted with the patient located at home and [name] at [facility]. The patient's identity was confirmed using their DOB and current address. The patient has consented to being evaluated through a telephone encounter and understands the associated risks (an examination cannot be done and the patient may need to come in for an appointment) / benefits (allows the patient to remain at home, decreasing exposure to coronavirus). I personally spent 6 minutes on medical discussion.

## 2021-04-02 NOTE — Progress Notes (Incomplete)
°  [facility] Internal Medicine Residency Telephone Encounter Continuity Care Appointment  HPI:  This telephone encounter was created for Kimberly Walters on 04/02/2021 for the following purpose/cc sore throat, stuffy nose.   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  As always, pt is advised that if symptoms worsen or new symptoms arise, they should go to an urgent care facility or to to ER for further evaluation.   Consent and Medical Decision Making:  Patient discussed with Dr.  Cain Walters This is a telephone encounter between Kimberly Walters on 04/02/2021 for sore throat, stuffy nose. The visit was conducted with the patient located at home and Kimberly Walters at [facility]. The patient's identity was confirmed using their DOB and current address. The patient has consented to being evaluated through a telephone encounter and understands the associated risks (an examination cannot be done and the patient may need to come in for an appointment) / benefits (allows the patient to remain at home, decreasing exposure to coronavirus). I personally spent 6 minutes on medical discussion.    No problem-specific Assessment & Plan notes found for this encounter.

## 2021-04-04 NOTE — Progress Notes (Signed)
Internal Medicine Clinic Attending ? ?Case discussed with Dr. Braswell  At the time of the visit.  We reviewed the resident?s history and exam and pertinent patient test results.  I agree with the assessment, diagnosis, and plan of care documented in the resident?s note.  ?

## 2021-04-05 ENCOUNTER — Telehealth: Payer: Medicare HMO | Admitting: Student

## 2021-04-12 ENCOUNTER — Other Ambulatory Visit: Payer: Self-pay | Admitting: Oncology

## 2021-05-21 ENCOUNTER — Telehealth (HOSPITAL_COMMUNITY): Payer: Self-pay

## 2021-05-21 NOTE — Telephone Encounter (Signed)
Called patient to see if she was interested in participating in the Pulmonary Rehab Program. Patient stated yes. Patient will come in for orientation on 05/23/21 @ 10:30AM and will attend the 10:15AM exercise class. ?  ?Tourist information centre manager.  ?

## 2021-05-22 ENCOUNTER — Ambulatory Visit
Admission: RE | Admit: 2021-05-22 | Discharge: 2021-05-22 | Disposition: A | Discharge status: Home / Self Care | Payer: Medicare HMO | Source: Ambulatory Visit | Attending: Internal Medicine | Admitting: Internal Medicine

## 2021-05-22 ENCOUNTER — Telehealth (HOSPITAL_COMMUNITY): Payer: Self-pay

## 2021-05-22 DIAGNOSIS — Z1231 Encounter for screening mammogram for malignant neoplasm of breast: Secondary | ICD-10-CM

## 2021-05-22 NOTE — Telephone Encounter (Signed)
Called to confirm appt. Pt confirmed appt. Instructed pt on proper footwear and COVID screening. Gave directions along with department number.  Pt voiced understanding.  ?

## 2021-05-23 ENCOUNTER — Encounter (HOSPITAL_COMMUNITY)
Admission: RE | Admit: 2021-05-23 | Discharge: 2021-05-23 | Disposition: A | Discharge status: Home / Self Care | Payer: Medicare HMO | Source: Ambulatory Visit | Attending: Student in an Organized Health Care Education/Training Program | Admitting: Student in an Organized Health Care Education/Training Program

## 2021-05-23 ENCOUNTER — Encounter (HOSPITAL_COMMUNITY): Payer: Self-pay

## 2021-05-23 VITALS — BP 128/82 | HR 84 | Ht 64.0 in | Wt 173.3 lb

## 2021-05-23 DIAGNOSIS — J449 Chronic obstructive pulmonary disease, unspecified: Secondary | ICD-10-CM | POA: Insufficient documentation

## 2021-05-23 NOTE — Progress Notes (Signed)
Pulmonary Individual Treatment Plan ? ?Patient Details  ?Name: NHU GLASBY ?MRN: [id number] ?Date of Birth: 01/07/1951 ?Referring Provider:   ?Flowsheet Row Pulmonary Rehab Walk Test from 05/23/2021 in [place]  ?Referring Provider Evette Doffing  ? ?  ? ? ?Initial Encounter Date:  ?Flowsheet Row Pulmonary Rehab Walk Test from 05/23/2021 in [place]  ?Date 05/23/21  ? ?  ? ? ?Visit Diagnosis: Stage 3 severe COPD by GOLD classification ([place]) ? ?Patient's Home Medications on Admission:  ? ?Current Outpatient Medications:  ?  acetaminophen (TYLENOL) 500 MG tablet, Take 500 mg by mouth every 6 (six) hours as needed for moderate pain., Disp: , Rfl:  ?  albuterol (VENTOLIN HFA) 108 (90 Base) MCG/ACT inhaler, Inhale 2 puffs into the lungs every 6 (six) hours as needed for wheezing or shortness of breath., Disp: 8 g, Rfl: 2 ?  cholecalciferol (VITAMIN D) 25 MCG (1000 UNIT) tablet, TAKE 1 TABLET EVERY DAY (Patient taking differently: 1,000 Units daily.), Disp: 90 tablet, Rfl: 3 ?  lisinopril (ZESTRIL) 20 MG tablet, TAKE 1 TABLET EVERY DAY (Patient taking differently: Take 20 mg by mouth daily.), Disp: 90 tablet, Rfl: 3 ?  pravastatin (PRAVACHOL) 10 MG tablet, TAKE 1 TABLET EVERY DAY (Patient taking differently: Take 10 mg by mouth daily.), Disp: 90 tablet, Rfl: 3 ?  Tiotropium Bromide Monohydrate (SPIRIVA RESPIMAT) 2.5 MCG/ACT AERS, Inhale 2 puffs into the lungs daily., Disp: 4 g, Rfl: 2 ?  XARELTO 20 MG TABS tablet, TAKE 1 TABLET(20 MG) BY MOUTH DAILY WITH SUPPER, Disp: 30 tablet, Rfl: 3 ?  budesonide-formoterol (SYMBICORT) 80-4.5 MCG/ACT inhaler, Inhale 2 puffs into the lungs daily. (Patient not taking: Reported on 05/23/2021), Disp: 1 each, Rfl: 12 ? ?Past Medical History: ?Past Medical History:  ?Diagnosis Date  ? Anticoagulant-induced bleeding ([place])   ? Aortic atherosclerosis ([place])   ? Emphysema, unspecified ([place])   ? 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 Gross  hematuria   ? Hypertension   ? Mitral valve prolapse   ? Paroxysmal atrial fibrillation with RVR ([place])   ? Pleural effusion on left   ? Post-thrombotic syndrome of left lower extremity   ? Primary hypercoagulable state ([place])   ? Pulmonary emboli ([place])   ? Pyelonephritis   ? ? ?Tobacco Use: ?Social History  ? ?Tobacco Use  ?Smoking Status Every Day  ? Packs/day: 0.01  ? Types: Cigarettes  ? Last attempt to quit: 07/01/2019  ? Years since quitting: 1.8  ?Smokeless Tobacco Never  ?Tobacco Comments  ? 1-2 per day Stopped 02/01/2021  ? ? ?Labs: ?Review Flowsheet   ? ?  ?  Latest Ref Rng & Units 11/16/2018 07/18/2020 08/18/2020  ?Labs for ITP Cardiac and Pulmonary Rehab  ?Cholestrol 100 - 199 mg/dL 168    142    ?LDL (calc) 0 - 99 mg/dL 85    70    ?HDL-C >39 mg/dL 58    41    ?Trlycerides 0 - 149 mg/dL 146    181    ?Hemoglobin A1c 4.8 - 5.6 % 6.2   6.4     ?  ? ? Multiple values from one day are sorted in reverse-chronological order  ?  ?  ? ? ?Capillary Blood Glucose: ?No results found for: GLUCAP ? ? ?Pulmonary Assessment Scores: ? Pulmonary Assessment Scores   ? ? Max Name 05/23/21 1036  ?  ?  ?  ? ADL UCSD  ? ADL Phase  Entry    ? SOB Score total 11    ?  ? CAT Score  ? CAT Score 11    ?  ? mMRC Score  ? mMRC Score 0    ? ?  ?  ? ?  ? ?UCSD: ?Self-administered rating of dyspnea associated with activities of daily living (ADLs) ?6-point scale (0 = "not at all" to 5 = "maximal or unable to do because of breathlessness")  ?Scoring Scores range from 0 to 120.  Minimally important difference is 5 units ? ?CAT: ?CAT can identify the health impairment of COPD patients and is better correlated with disease progression.  ?CAT has a scoring range of zero to 40. The CAT score is classified into four groups of low (less than 10), medium (10 - 20), high (21-30) and very high (31-40) based on the impact level of disease on health status. A CAT score over 10 suggests significant symptoms.  A worsening CAT score could be explained by an  exacerbation, poor medication adherence, poor inhaler technique, or progression of COPD or comorbid conditions.  ?CAT MCID is 2 points ? ?mMRC: ?mMRC (Modified Medical Research Council) Dyspnea Scale is used to assess the degree of baseline functional disability in patients of respiratory disease due to dyspnea. ?No minimal important difference is established. A decrease in score of 1 point or greater is considered a positive change.  ? ?Pulmonary Function Assessment: ? ? ?Exercise Target Goals: ?Exercise Program Goal: ?Individual exercise prescription set using results from initial 6 min walk test and THRR while considering  patient?s activity barriers and safety.  ? ?Exercise Prescription Goal: ?Initial exercise prescription builds to 30-45 minutes a day of aerobic activity, 2-3 days per week.  Home exercise guidelines will be given to patient during program as part of exercise prescription that the participant will acknowledge. ? ?Activity Barriers & Risk Stratification: ? Activity Barriers & Cardiac Risk Stratification - 05/23/21 1038   ? ?  ? Activity Barriers & Cardiac Risk Stratification  ? Activity Barriers Shortness of Breath;Muscular Weakness;Deconditioning   ? ?  ?  ? ?  ? ? ?6 Minute Walk: ? 6 Minute Walk   ? ? [place] Name 05/23/21 1214  ?  ?  ?  ? 6 Minute Walk  ? Phase Initial    ? Distance 1210 feet    ? Walk Time 6 minutes    ? # of Rest Breaks 1  2:20-3:00    ? MPH 2.29    ? METS 2.66    ? RPE 11    ? Perceived Dyspnea  1    ? VO2 Peak 9.31    ? Symptoms No    ? Resting HR 84 bpm    ? Resting BP 128/82    ? Resting Oxygen Saturation  95 %    ? Exercise Oxygen Saturation  during 6 min walk 84 %    ? Max Ex. HR 117 bpm    ? Max Ex. BP 132/74    ? 2 Minute Post BP 128/78    ?  ? Interval HR  ? 1 Minute HR 102    ? 2 Minute HR 100    ? 3 Minute HR 94    ? 4 Minute HR 102    ? 5 Minute HR 108    ? 6 Minute HR 117    ? 2 Minute Post HR 81    ? Interval Heart Rate? Yes    ?  ?  Interval Oxygen  ? Interval  Oxygen? Yes    ? Baseline Oxygen Saturation % 95 %    ? 1 Minute Oxygen Saturation % 88 %    ? 1 Minute Liters of Oxygen 0 L    ? 2 Minute Oxygen Saturation % 88 %    ? 2 Minute Liters of Oxygen 0 L    ? 3 Minute Oxygen Saturation % 90 %    ? 3 Minute Liters of Oxygen 0 L    ? 4 Minute Oxygen Saturation % 89 %    ? 4 Minute Liters of Oxygen 0 L    ? 5 Minute Oxygen Saturation % 88 %    ? 5 Minute Liters of Oxygen 0 L    ? 6 Minute Oxygen Saturation % 84 %    ? 6 Minute Liters of Oxygen 0 L    ? 2 Minute Post Oxygen Saturation % 96 %    ? 2 Minute Post Liters of Oxygen 0 L    ? ?  ?  ? ?  ? ? ?Oxygen Initial Assessment: ? Oxygen Initial Assessment - 05/23/21 1035   ? ?  ? Home Oxygen  ? Home Oxygen Device None   ? Sleep Oxygen Prescription None   ? Home Exercise Oxygen Prescription None   ? Home Resting Oxygen Prescription None   ?  ? Initial 6 min Walk  ? Oxygen Used None   ?  ? Program Oxygen Prescription  ? Program Oxygen Prescription None   ?  ? Intervention  ? Short Term Goals To learn and exhibit compliance with exercise, home and travel O2 prescription;To learn and understand importance of monitoring SPO2 with pulse oximeter and demonstrate accurate use of the pulse oximeter.;To learn and understand importance of maintaining oxygen saturations>88%;To learn and demonstrate proper pursed lip breathing techniques or other breathing techniques. ;To learn and demonstrate proper use of respiratory medications   ? Long  Term Goals Verbalizes importance of monitoring SPO2 with pulse oximeter and return demonstration;Exhibits compliance with exercise, home  and travel O2 prescription;Maintenance of O2 saturations>88%;Exhibits proper breathing techniques, such as pursed lip breathing or other method taught during program session;Demonstrates proper use of MDI?s;Compliance with respiratory medication   ? ?  ?  ? ?  ? ? ?Oxygen Re-Evaluation: ? ? ?Oxygen Discharge (Final Oxygen Re-Evaluation): ? ? ?Initial Exercise  Prescription: ? Initial Exercise Prescription - 05/23/21 1200   ? ?  ? Date of Initial Exercise RX and Referring Provider  ? Date 05/23/21   ? Referring Provider Evette Doffing   ? Expected Discharge Date 07/26/21   ?  ?

## 2021-05-23 NOTE — Progress Notes (Signed)
Kimberly Walters 71 y.o. female ?Pulmonary Rehab Orientation Note ?This patient who was referred to Pulmonary Rehab by Dr. Evette Doffing with the diagnosis of COPD stage 3 arrived today in Cardiac and Pulmonary Rehab. She  arrived with ambulatory normal gait. She  does not carry portable oxygen. Per pt, Kimberly Walters uses oxygen never. Color good, skin warm and dry. Patient is oriented to time and place. Patient's medical history, psychosocial health, and medications reviewed. Psychosocial assessment reveals pt lives with family. Kimberly Walters is currently retired. Pt hobbies include spending time with others. Pt reports her stress level is low. Pt reports no stressors. Pt does not exhibit signs of depression. PHQ2/9 score 0/2. Kimberly Walters shows good  coping skills with positive outlook on life. Offered emotional support and reassurance. Will continue to monitor. Physical assessment performed by Maurice Small, RN. Please see their orientation physical assessment note. Shalamar reports she does take medications as prescribed. Patient states she follows a low sodium  diet. The patient has been trying to lose weight through a healthy diet and exercise program.. Pt's weight will be monitored closely. Demonstration and practice of PLB using pulse oximeter. [name] able to return demonstration satisfactorily. Safety and hand hygiene in the exercise area reviewed with patient. [name] voices understanding of the information reviewed. Department expectations discussed with patient and achievable goals were set. The patient shows enthusiasm about attending the program and we look forward to working with Mel Almond. Kimberly Walters completed a 6 min walk test today and is scheduled to begin exercise on 05/29/21 at 10:15 am.  ? ?[id number] ?Sheppard Plumber, MS, ACSM-CEP ?  ?

## 2021-05-23 NOTE — Progress Notes (Signed)
Kimberly Walters 71 y.o. female ? ?Initial Psychosocial Assessment ? ?Pt psychosocial assessment reveals pt lives with their family. Pt is currently retired. Pt hobbies include spending time with others. Kimberly Walters lives with her two sons. She enjoys taking care of her grandchildren and great grandchildren. Pt reports her stress level is low. Pt does not report any stressors. Pt does not exhibit signs of depression. Pt shows good  coping skills with positive outlook . Offered emotional support and reassurance. Will continue to monitor.  ? ? ?05/23/2021 12:24 PM ?  ?

## 2021-05-28 NOTE — Progress Notes (Signed)
Pulmonary Rehab Orientation Physical Assessment Note ? ?Physical assessment reveals  Pt is alert and oriented x 3.  Heart rate is normal, breath sounds clear to auscultation, no wheezes, rales, or rhonchi, diminished throughout.  Reports productive/non-productive cough. Bowel sounds present.  Pt denies abdominal discomfort, nausea, vomiting or diarrhea. Grip strength equal, strong. Distal pulses palpable; trace swelling to lower extremities. [name], BSN ?Cardiac and Pulmonary Rehab Nurse Navigator  ? ?

## 2021-05-29 ENCOUNTER — Encounter: Payer: Self-pay | Admitting: Internal Medicine

## 2021-05-29 ENCOUNTER — Ambulatory Visit (INDEPENDENT_AMBULATORY_CARE_PROVIDER_SITE_OTHER): Payer: Medicare HMO | Admitting: Internal Medicine

## 2021-05-29 ENCOUNTER — Telehealth: Payer: Self-pay | Admitting: *Deleted

## 2021-05-29 ENCOUNTER — Encounter (HOSPITAL_COMMUNITY)
Admission: RE | Admit: 2021-05-29 | Discharge: 2021-05-29 | Disposition: A | Discharge status: Home / Self Care | Payer: Medicare HMO | Source: Ambulatory Visit | Attending: Student in an Organized Health Care Education/Training Program | Admitting: Student in an Organized Health Care Education/Training Program

## 2021-05-29 ENCOUNTER — Other Ambulatory Visit: Payer: Self-pay

## 2021-05-29 VITALS — BP 139/84 | HR 81 | Temp 97.8°F | Resp 28 | Ht 64.0 in | Wt 175.3 lb

## 2021-05-29 DIAGNOSIS — L0390 Cellulitis, unspecified: Secondary | ICD-10-CM | POA: Insufficient documentation

## 2021-05-29 DIAGNOSIS — J449 Chronic obstructive pulmonary disease, unspecified: Secondary | ICD-10-CM

## 2021-05-29 DIAGNOSIS — M79604 Pain in right leg: Secondary | ICD-10-CM | POA: Diagnosis not present

## 2021-05-29 DIAGNOSIS — R21 Rash and other nonspecific skin eruption: Secondary | ICD-10-CM | POA: Insufficient documentation

## 2021-05-29 DIAGNOSIS — R600 Localized edema: Secondary | ICD-10-CM | POA: Diagnosis not present

## 2021-05-29 NOTE — Progress Notes (Signed)
Incomplete Session Note ? ?Patient Details  ?Name: Kimberly Walters ?MRN: [id number] ?Date of Birth: 1950-06-08 ?Referring Provider:   ?Flowsheet Row Pulmonary Rehab Walk Test from 05/23/2021 in [place]  ?Referring Provider Evette Doffing  ? ?  ? ? ?Kimberly Walters did not complete her rehab session.  Arrived today for her first day of pulmonary rehab. Pt with swelling of her right leg greater than left leg.  Right leg is warm to touch and painful particularly in the ankle area.  [name] noticed this on yesterday.  This is a similar presentation when she had a blood clot on the left leg. [name] is compliant with Xarelto and denies any shortness of breath.  With [facility] prior history, pt will not exercise.  Called internal medicine clinic here at [facility].  Scheduled an appt for 3:15 today.  [name] and she verbalized understanding. [name], BSN ?Cardiac and Pulmonary Rehab Nurse Navigator   ?

## 2021-05-29 NOTE — Telephone Encounter (Signed)
Call from [facility] [place] - requesting an appt for pt. States pt's right leg is swollen,warm, painful to touch.Denies ShOB. Pt has a history of blood clot. Pt was not allow to exercise. Informed Carlette, no appts this am; only available appt this afternoon. Appt schedule w/Dr Aslam @ 1515PM. I asked Carlette to inform pt if her symptoms worsen, develop ShOB,to go to the ED - stated she will inform pt. ?

## 2021-05-29 NOTE — Progress Notes (Signed)
? ?  CC: right lower leg swelling and pain ? ?HPI: ? ?[name] is a 71 y.o. female with PMHx as stated below presenting for evaluation of right lower leg swelling and pain. This was noted during her pulmonary rehab appointment today and given concern for possible DVT, patient was advised for urgent evaluation in [facility]. Patient reports improvement of symptoms since this morning. Please see problem based charting for complete assessment and plan. ? ?Past Medical History:  ?Diagnosis Date  ? Anticoagulant-induced bleeding ([place])   ? Aortic atherosclerosis ([place])   ? Emphysema, unspecified ([place])   ? 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 Gross hematuria   ? Hypertension   ? Mitral valve prolapse   ? Paroxysmal atrial fibrillation with RVR ([place])   ? Pleural effusion on left   ? Post-thrombotic syndrome of left lower extremity   ? Primary hypercoagulable state ([place])   ? Pulmonary emboli ([place])   ? Pyelonephritis   ? ?Review of Systems:  Negative except as stated in HPI. ? ?Physical Exam: ? ?Vitals:  ? 05/29/21 1454  ?BP: (!) 157/76  ?Pulse: 87  ?Resp: (!) 28  ?Temp: 97.8 ?F (36.6 ?C)  ?TempSrc: Oral  ?SpO2: 95%  ?Weight: 175 lb 4.8 oz (79.5 kg)  ?Height: '5\' 4"'$  (1.626 m)  ? ?Physical Exam  ?Constitutional: Appears well-developed and well-nourished. No distress.  ?HENT: Normocephalic and atraumatic, moist mucous membranes ?Cardiovascular: Normal rate, regular rhythm, S1 and S2 present. Distal pulses intact ?Respiratory: Effort is normal on room air.  Lungs are clear to auscultation bilaterally. ?Musculoskeletal: Normal bulk and tone.  1+ pitting edema of bilateral lower extremities; RLE appears slightly larger in diameter than LLE without warmth or erythema.  ?Neurological: Is alert and oriented x4, no apparent focal deficits noted. ?Skin: Warm and dry.  No rash, erythema, lesions noted. ?Psychiatric: Normal mood and affect.  ? ?Assessment & Plan:  ? ?See Encounters Tab for problem based charting. ? ?Patient discussed with  Dr. Dareen Piano ? ?

## 2021-05-29 NOTE — Patient Instructions (Signed)
Ms Kimberly Walters, ? ?It was a pleasure seeing you in clinic. Today we discussed:  ? ?Right leg swelling and pain: ?I ordered an ultrasound to check for any blood clots in the right leg that could be causing this. You will be called to schedule this as soon as possible. Meanwhile, continue taking Xarelto. ?As we discussed, this could be from venous insufficiency as well for which we would recommend compression stockings.  ?I will call you with the results of the ultrasound and any further recommendations.  ? ?If you have any questions or concerns, please call our clinic at [phone] between 9am-5pm and after hours call [phone] and ask for the internal medicine resident on call. If you feel you are having a medical emergency please call 911.  ? ?Thank you, we look forward to helping you remain healthy! ? ? ? ?

## 2021-05-29 NOTE — Assessment & Plan Note (Addendum)
Patient is presenting for evaluation of bilateral lower extremity edema (R>L). Patient reports that this has been persistent for the past two months and is worse at the end of the day. This morning, patient reports that she woke up around 5am per usual routine, dropped off her granddaughter at college, came home, showered and went to her pulmonary rehab appointment. At the appointment, she was noted to have right lower extremity swelling, warmth and tenderness. Patient was advised to return home and schedule a visit to be seen in clinic. She reports going home, elevating her legs for a few hours before coming to clinic with improvement in her lower extremity swelling.  ?On examination, patient has bilateral lower extremity pitting edema, right > left.  No significant warmth or tenderness appreciated on exam. Cardiopulmonary exam benign. ?Patient has been compliant with her xarelto and her symptoms appear more consistent with chronic venous insufficiency. ? ?Plan: ?RLE US DVT  ?If negative, advised for compression stockings ?Recommended to continue current medication regimen ? ?ADDENDUM: ?RLE [place] negative for acute DVT, did note cystic lesion in popliteal fossa. Patient denies any knee/leg pain at this time. Suspect that her symptoms are likely in setting of chronic venous insufficiency. Patient advised for compression stockings and may continue with pulmonary rehab. She expresses understanding. ?

## 2021-05-30 ENCOUNTER — Telehealth (HOSPITAL_COMMUNITY): Payer: Self-pay | Admitting: *Deleted

## 2021-05-30 NOTE — Addendum Note (Signed)
Addended by: Harvie Heck on: 05/30/2021 01:25 PM ? ? Modules accepted: Orders ? ?

## 2021-05-30 NOTE — Telephone Encounter (Signed)
Reviewed pt follow up note with primary care team-5/9. Pt called and advised to hold on starting pulmonary rehab until doppler has been completed and interpreted.  Kimberly Walters verbalized understanding. Kimberly Walters, BSN ?Cardiac and Pulmonary Rehab Nurse Navigator ' ?

## 2021-05-31 ENCOUNTER — Encounter (HOSPITAL_COMMUNITY): Payer: Medicare HMO

## 2021-05-31 NOTE — Progress Notes (Signed)
Internal Medicine Clinic Attending ° °Case discussed with Dr. Aslam  At the time of the visit.  We reviewed the resident’s history and exam and pertinent patient test results.  I agree with the assessment, diagnosis, and plan of care documented in the resident’s note.  °

## 2021-06-01 ENCOUNTER — Ambulatory Visit (HOSPITAL_COMMUNITY)
Admission: RE | Admit: 2021-06-01 | Discharge: 2021-06-01 | Disposition: A | Discharge status: Home / Self Care | Payer: Medicare HMO | Source: Ambulatory Visit | Attending: Student in an Organized Health Care Education/Training Program | Admitting: Student in an Organized Health Care Education/Training Program

## 2021-06-01 DIAGNOSIS — M79604 Pain in right leg: Secondary | ICD-10-CM

## 2021-06-05 ENCOUNTER — Encounter (HOSPITAL_COMMUNITY)
Admission: RE | Admit: 2021-06-05 | Discharge: 2021-06-05 | Disposition: A | Discharge status: Home / Self Care | Payer: Medicare HMO | Source: Ambulatory Visit

## 2021-06-05 ENCOUNTER — Ambulatory Visit (INDEPENDENT_AMBULATORY_CARE_PROVIDER_SITE_OTHER): Payer: Medicare HMO | Admitting: Internal Medicine

## 2021-06-05 ENCOUNTER — Encounter: Payer: Self-pay | Admitting: Internal Medicine

## 2021-06-05 ENCOUNTER — Other Ambulatory Visit: Payer: Self-pay

## 2021-06-05 ENCOUNTER — Telehealth: Payer: Self-pay | Admitting: *Deleted

## 2021-06-05 DIAGNOSIS — M62830 Muscle spasm of back: Secondary | ICD-10-CM

## 2021-06-05 DIAGNOSIS — R600 Localized edema: Secondary | ICD-10-CM | POA: Diagnosis not present

## 2021-06-05 MED ORDER — CYCLOBENZAPRINE HCL 5 MG PO TABS: 5.0000 mg | ORAL_TABLET | Freq: Three times a day (TID) | ORAL | 0 refills | Status: DC | PRN

## 2021-06-05 NOTE — Telephone Encounter (Signed)
Patient at pulmonary rehab.  C/o of back pain since Sunday to Pulmonary Staff.  Appointment given for patient to come for an appointment this afternoon for assessment of back pain. ?

## 2021-06-05 NOTE — Progress Notes (Signed)
Kimberly Walters presented to pulmonary rehab with the complaint of bilateral lower extremity swelling and painful to touch.  Kimberly Walters also has complaint of lower mid back/buttock area with pain that radiates down left left leg to her knee cap area.  Kimberly Walters is unable to exercise today and ask for assistance in getting an appointment with internal medicine clinic.  Note that Kimberly Walters was instructed to get "compression stocking" but she has not gotten because she did not know where to get them.  Conferred with Harriette Bouillon, pulmonary rehab staff. [name], BSN ?Cardiac and Pulmonary Rehab Nurse Navigator  ? ?

## 2021-06-05 NOTE — Patient Instructions (Signed)
It was nice seeing you today! Thank you for choosing [facility] for your Primary Care.  ?  ?Today we talked about:  ? ?Back Pain: I suspect your back pain is due to a muscle spasm. Continue to use massage, heat and ointments to help with pain. In addition, I am sending a prescription for a muscle relaxer called Cyclobenzaprine (flexeril). This medication can make you drowsy, so avoid driving while taking this medication.  ? ?Leg Swelling: I recommend purchasing compression stockings (or socks) from the pharmacy. This must go up to your knees.  ?

## 2021-06-06 ENCOUNTER — Telehealth (HOSPITAL_COMMUNITY): Payer: Self-pay | Admitting: *Deleted

## 2021-06-06 DIAGNOSIS — M62830 Muscle spasm of back: Secondary | ICD-10-CM | POA: Insufficient documentation

## 2021-06-06 NOTE — Telephone Encounter (Signed)
Called Arta to check on her since her doctors appointment yesterday. She states that she is feeling a little better than yesterday. Ambur has not been able to attend any exercise sessions since her orientation appointment. I discussed with her that she may need to let her medical issues get to a stable place so that she will be able to benefit from attending the PR program. She agrees to this  and explained that I would give her information back to our schedulers to contact her again. I will discharge her. I also discussed smoking cessation with her and tips for quitting. I encouraged her to call the 1-800 quit now line to get a coach and to set a quit date. She was open to these suggestions. I gave pt information on where she could obtain elastic stocking [employer] [place] at their Outlet store. She was very appreciative of this.  ?

## 2021-06-06 NOTE — Assessment & Plan Note (Signed)
Ms. Groome states that she continues to have bilateral lower extremity swelling, but it seems more equal between the 2 legs at this time.  She was last seen for this approximately 1 week ago, at which time a right lower extremity duplex was ordered was negative for DVT.  She was recommended to purchase compression stockings.  She has not done so yet. ? ?Assessment/plan: ?On examination, there is 1+ pitting edema bilaterally with equal size of bilateral lower extremities.  Pulses intact bilaterally.  Recommended she go ahead and purchase compression stockings. ?

## 2021-06-06 NOTE — Assessment & Plan Note (Signed)
Kimberly Walters states she has been experiencing left lumbar muscle pain for approximately 3 days.  The pain came on suddenly after she awoke on 5/14.  The day prior, she worked at a yard [employer] but does not recall picking up anything heavy or any other injuries.  She has been using massage, heat, and ointments with only partial relief.  She denies any limited range of motion, numbness or tingling, urinary incontinence, bowel incontinence.  She denies any other back pain at this time. ? ?Assessment/plan: ?On examination, there is no spinal point tenderness.  She does have lumbar paraspinal muscle tenderness with underlying spasm.  Recommended continued use of massage, heat, and topical ointments.  Given she is currently on anticoagulant with previous anticoagulant induced bleeding, will need to avoid NSAIDs.  We will trial a short course of muscle relaxers.  Patient states she is only willing to take the muscle relaxers before bedtime.  Due to this, we will try Flexeril. ? ?- Flexeril 5 mg 3 times daily as needed.  Counseled extensively that this medicine can cause drowsiness ?- Follow-up in 1 to 2 weeks if no improvement ?

## 2021-06-06 NOTE — Progress Notes (Signed)
? ?  CC: Back pain, lower extremity edema ? ?HPI: ? ?[name] is a 71 y.o. with a PMHx as listed below who presents to the clinic for back pain; lower extremity edema.  ? ?Please see the Encounters tab for problem-based Assessment & Plan regarding status of patient's acute and chronic conditions. ? ?Past Medical History:  ?Diagnosis Date  ? Anticoagulant-induced bleeding ([place])   ? Aortic atherosclerosis ([employer])   ? Emphysema, unspecified ([place])   ? 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 Gross hematuria   ? Hypertension   ? Mitral valve prolapse   ? Paroxysmal atrial fibrillation with RVR ([place])   ? Pleural effusion on left   ? Post-thrombotic syndrome of left lower extremity   ? Primary hypercoagulable state ([place])   ? Pulmonary emboli ([place])   ? Pyelonephritis   ? ?Review of Systems: Review of Systems  ?Constitutional:  Negative for chills, fever, malaise/fatigue and weight loss.  ?Respiratory:  Negative for cough and shortness of breath.   ?Cardiovascular:  Positive for leg swelling. Negative for chest pain, palpitations, orthopnea and claudication.  ?Gastrointestinal:  Negative for abdominal pain, diarrhea, nausea and vomiting.  ?Musculoskeletal:  Positive for back pain. Negative for falls and joint pain.  ?Neurological:  Negative for dizziness and headaches.  ? ?Physical Exam: ? ?Vitals:  ? 06/05/21 1518  ?BP: (!) 153/72  ?Pulse: 97  ?Temp: 98.1 ?F (36.7 ?C)  ?TempSrc: Oral  ?SpO2: 90%  ?Weight: 176 lb 3.2 oz (79.9 kg)  ?Height: '5\' 4"'$  (1.626 m)  ? ?Physical Exam ?Vitals and nursing note reviewed.  ?Constitutional:   ?   Appearance: She is overweight.  ?HENT:  ?   Head: Normocephalic and atraumatic.  ?Eyes:  ?   Conjunctiva/sclera: Conjunctivae normal.  ?   Pupils: Pupils are equal, round, and reactive to light.  ?Cardiovascular:  ?   Rate and Rhythm: Normal rate and regular rhythm.  ?Pulmonary:  ?   Effort: Pulmonary effort is normal. No respiratory distress.  ?Musculoskeletal:  ?   Lumbar back: Spasms and  tenderness (left paraspinal TTP) present. No swelling, deformity or bony tenderness. Normal range of motion.  ?   Right lower leg: 1+ Pitting Edema present.  ?   Left lower leg: 1+ Pitting Edema present.  ?Skin: ?   General: Skin is warm and dry.  ?Neurological:  ?   General: No focal deficit present.  ?   Mental Status: She is alert and oriented to person, place, and time. Mental status is at baseline.  ?Psychiatric:     ?   Mood and Affect: Mood normal.     ?   Behavior: Behavior normal.     ?   Thought Content: Thought content normal.     ?   Judgment: Judgment normal.  ? ? ?Assessment & Plan:  ? ?See Encounters Tab for problem based charting. ? ?Patient discussed with Dr. Dareen Piano ? ?

## 2021-06-07 ENCOUNTER — Encounter (HOSPITAL_COMMUNITY): Admission: RE | Admit: 2021-06-07 | Payer: Medicare HMO | Source: Ambulatory Visit

## 2021-06-07 NOTE — Progress Notes (Signed)
Discharge Progress Report  Patient Details  Name: Kimberly Walters MRN: [id number] Date of Birth: 1950/06/17 Referring Provider:   [facility] Walk Test from 05/23/2021 in [place]  Referring Provider Evette Doffing        Number of Visits: 1  Reason for Discharge:  Early Exit:  Kimberly Walters came in for exercise twice but she was not able to exercise.o Her first day she was having leg pain and has a history of blood clots. We made her an appointment with Internal Medicine.The second time she came in with back pain running down her leg.We made her another appointment with Internal Medicine. Discussed with her that she needs to get more medically stable before trying to exercise at the [facility] program and return later. She agreed to that so we will discharge her.  Smoking History:  Social History   Tobacco Use  Smoking Status Every Day   Packs/day: 0.01   Types: Cigarettes   Last attempt to quit: 07/01/2019   Years since quitting: 1.9  Smokeless Tobacco Never  Tobacco Comments   1-2 per day Stopped 02/01/2021    Diagnosis:  Stage 3 severe COPD by GOLD classification ([place])  ADL UCSD:  Pulmonary Assessment Scores     Row Name 05/23/21 1036         ADL UCSD   ADL Phase Entry     SOB Score total 11       CAT Score   CAT Score 11       mMRC Score   mMRC Score 0              Initial Exercise Prescription:  Initial Exercise Prescription - 05/23/21 1200       Date of Initial Exercise RX and Referring Provider   Date 05/23/21    Referring Provider Evette Doffing    Expected Discharge Date 07/26/21      NuStep   Level 1    SPM 60    Minutes 15      Track   Minutes 15    METs 2.66      Prescription Details   Frequency (times per week) 2    Duration Progress to 30 minutes of continuous aerobic without signs/symptoms of physical distress      Intensity   THRR 40-80% of Max Heartrate 60-119    Ratings of Perceived Exertion 11-13     Perceived Dyspnea 0-4      Progression   Progression Continue to progress workloads to maintain intensity without signs/symptoms of physical distress.      Resistance Training   Training Prescription Yes    Weight red bands    Reps 10-15             Discharge Exercise Prescription (Final Exercise Prescription Changes):   Functional Capacity:  6 Minute Walk     Row Name 05/23/21 1214         6 Minute Walk   Phase Initial     Distance 1210 feet     Walk Time 6 minutes     # of Rest Breaks 1  2:20-3:00     MPH 2.29     METS 2.66     RPE 11     Perceived Dyspnea  1     VO2 Peak 9.31     Symptoms No     Resting HR 84 bpm     Resting BP 128/82     Resting Oxygen  Saturation  95 %     Exercise Oxygen Saturation  during 6 min walk 84 %     Max Ex. HR 117 bpm     Max Ex. BP 132/74     2 Minute Post BP 128/78       Interval HR   1 Minute HR 102     2 Minute HR 100     3 Minute HR 94     4 Minute HR 102     5 Minute HR 108     6 Minute HR 117     2 Minute Post HR 81     Interval Heart Rate? Yes       Interval Oxygen   Interval Oxygen? Yes     Baseline Oxygen Saturation % 95 %     1 Minute Oxygen Saturation % 88 %     1 Minute Liters of Oxygen 0 L     2 Minute Oxygen Saturation % 88 %     2 Minute Liters of Oxygen 0 L     3 Minute Oxygen Saturation % 90 %     3 Minute Liters of Oxygen 0 L     4 Minute Oxygen Saturation % 89 %     4 Minute Liters of Oxygen 0 L     5 Minute Oxygen Saturation % 88 %     5 Minute Liters of Oxygen 0 L     6 Minute Oxygen Saturation % 84 %     6 Minute Liters of Oxygen 0 L     2 Minute Post Oxygen Saturation % 96 %     2 Minute Post Liters of Oxygen 0 L              Psychological, QOL, Others - Outcomes: PHQ 2/9:    06/05/2021    3:14 PM 05/23/2021   10:27 AM 02/12/2021   10:35 AM 02/01/2021    4:28 PM 08/18/2020   10:35 AM  Depression screen PHQ 2/9  Decreased Interest 0 0 0 0 0  Down, Depressed, Hopeless 0 0 0 0 0   PHQ - 2 Score 0 0 0 0 0  Altered sleeping  2 0    Tired, decreased energy  0 1    Change in appetite  0 0    Feeling bad or failure about yourself   0 0    Trouble concentrating  0 0    Moving slowly or fidgety/restless  0 0    Suicidal thoughts 0 0 0    PHQ-9 Score  2 1    Difficult doing work/chores Not difficult at all Somewhat difficult Not difficult at all  Not difficult at all    Quality of Life:   Personal Goals: Goals established at orientation with interventions provided to work toward goal.  Personal Goals and Risk Factors at Admission - 05/23/21 1031       Core Components/Risk Factors/Patient Goals on Admission    Weight Management Weight Loss    Improve shortness of breath with ADL's Yes    Intervention Provide education, individualized exercise plan and daily activity instruction to help decrease symptoms of SOB with activities of daily living.    Expected Outcomes Long Term: Be able to perform more ADLs without symptoms or delay the onset of symptoms;Short Term: Improve cardiorespiratory fitness to achieve a reduction of symptoms when performing ADLs  Personal Goals Discharge:   Exercise Goals and Review:  Exercise Goals     Row Name 05/23/21 1037 06/04/21 0931           Exercise Goals   Increase Physical Activity Yes Yes      Intervention Provide advice, education, support and counseling about physical activity/exercise needs.;Develop an individualized exercise prescription for aerobic and resistive training based on initial evaluation findings, risk stratification, comorbidities and participant's personal goals. Provide advice, education, support and counseling about physical activity/exercise needs.;Develop an individualized exercise prescription for aerobic and resistive training based on initial evaluation findings, risk stratification, comorbidities and participant's personal goals.      Expected Outcomes Short Term: Attend rehab on a  regular basis to increase amount of physical activity.;Long Term: Add in home exercise to make exercise part of routine and to increase amount of physical activity.;Long Term: Exercising regularly at least 3-5 days a week. Short Term: Attend rehab on a regular basis to increase amount of physical activity.;Long Term: Add in home exercise to make exercise part of routine and to increase amount of physical activity.;Long Term: Exercising regularly at least 3-5 days a week.      Increase Strength and Stamina Yes Yes      Intervention Develop an individualized exercise prescription for aerobic and resistive training based on initial evaluation findings, risk stratification, comorbidities and participant's personal goals.;Provide advice, education, support and counseling about physical activity/exercise needs. Develop an individualized exercise prescription for aerobic and resistive training based on initial evaluation findings, risk stratification, comorbidities and participant's personal goals.;Provide advice, education, support and counseling about physical activity/exercise needs.      Expected Outcomes Short Term: Increase workloads from initial exercise prescription for resistance, speed, and METs.;Short Term: Perform resistance training exercises routinely during rehab and add in resistance training at home;Long Term: Improve cardiorespiratory fitness, muscular endurance and strength as measured by increased METs and functional capacity (6MWT) Short Term: Increase workloads from initial exercise prescription for resistance, speed, and METs.;Short Term: Perform resistance training exercises routinely during rehab and add in resistance training at home;Long Term: Improve cardiorespiratory fitness, muscular endurance and strength as measured by increased METs and functional capacity (6MWT)      Able to understand and use rate of perceived exertion (RPE) scale Yes Yes      Intervention Provide education and  explanation on how to use RPE scale Provide education and explanation on how to use RPE scale      Expected Outcomes Short Term: Able to use RPE daily in rehab to express subjective intensity level;Long Term:  Able to use RPE to guide intensity level when exercising independently Short Term: Able to use RPE daily in rehab to express subjective intensity level;Long Term:  Able to use RPE to guide intensity level when exercising independently      Able to understand and use Dyspnea scale Yes Yes      Intervention Provide education and explanation on how to use Dyspnea scale Provide education and explanation on how to use Dyspnea scale      Expected Outcomes Short Term: Able to use Dyspnea scale daily in rehab to express subjective sense of shortness of breath during exertion;Long Term: Able to use Dyspnea scale to guide intensity level when exercising independently Short Term: Able to use Dyspnea scale daily in rehab to express subjective sense of shortness of breath during exertion;Long Term: Able to use Dyspnea scale to guide intensity level when exercising independently      Knowledge  and understanding of Target Heart Rate Range (THRR) Yes Yes      Intervention Provide education and explanation of THRR including how the numbers were predicted and where they are located for reference Provide education and explanation of THRR including how the numbers were predicted and where they are located for reference      Expected Outcomes Long Term: Able to use THRR to govern intensity when exercising independently;Short Term: Able to state/look up THRR;Short Term: Able to use daily as guideline for intensity in rehab Long Term: Able to use THRR to govern intensity when exercising independently;Short Term: Able to state/look up THRR;Short Term: Able to use daily as guideline for intensity in rehab      Understanding of Exercise Prescription Yes Yes      Intervention Provide education, explanation, and written  materials on patient's individual exercise prescription Provide education, explanation, and written materials on patient's individual exercise prescription      Expected Outcomes Short Term: Able to explain program exercise prescription;Long Term: Able to explain home exercise prescription to exercise independently Short Term: Able to explain program exercise prescription;Long Term: Able to explain home exercise prescription to exercise independently               Exercise Goals Re-Evaluation:  Exercise Goals Re-Evaluation     Row Name 06/04/21 0932             Exercise Goal Re-Evaluation   Exercise Goals Review Increase Physical Activity;Increase Strength and Stamina;Able to understand and use rate of perceived exertion (RPE) scale;Able to understand and use Dyspnea scale;Knowledge and understanding of Target Heart Rate Range (THRR);Understanding of Exercise Prescription       Comments Kimberly Walters was scheduled to begin exercise 05/29/21. She had leg pain when she came in and was sent to Internal Medicine. She is currently on medical hold. Will continue to monitor and progress as able.       Expected Outcomes Through exercise at rehab and home, the patient will decrease shortness of breath with daily activities and feel confident in carrying out an exercise regimen at home.                Nutrition & Weight - Outcomes:  Pre Biometrics - 05/23/21 1020       Pre Biometrics   Grip Strength 19 kg              Nutrition:   Nutrition Discharge:  Nutrition Assessments - 05/30/21 0855       Rate Your Plate Scores   Pre Score 57             Education Questionnaire Score:  Knowledge Questionnaire Score - 05/23/21 1034       Knowledge Questionnaire Score   Pre Score 16/18             Goals reviewed with patient; copy given to patient.

## 2021-06-12 ENCOUNTER — Encounter (HOSPITAL_COMMUNITY): Payer: Medicare HMO

## 2021-06-14 ENCOUNTER — Encounter (HOSPITAL_COMMUNITY): Payer: Medicare HMO

## 2021-06-19 ENCOUNTER — Encounter (HOSPITAL_COMMUNITY): Payer: Medicare HMO

## 2021-06-21 ENCOUNTER — Encounter (HOSPITAL_COMMUNITY): Payer: Medicare HMO

## 2021-06-26 ENCOUNTER — Encounter (HOSPITAL_COMMUNITY): Payer: Medicare HMO

## 2021-06-26 NOTE — Progress Notes (Signed)
Internal Medicine Clinic Attending  Case discussed with Dr. Basaraba  At the time of the visit.  We reviewed the resident's history and exam and pertinent patient test results.  I agree with the assessment, diagnosis, and plan of care documented in the resident's note.  

## 2021-06-28 ENCOUNTER — Encounter (HOSPITAL_COMMUNITY): Payer: Medicare HMO

## 2021-07-03 ENCOUNTER — Telehealth: Payer: Self-pay | Admitting: Oncology

## 2021-07-03 ENCOUNTER — Encounter (HOSPITAL_COMMUNITY): Payer: Medicare HMO

## 2021-07-03 NOTE — Telephone Encounter (Signed)
Called patient regarding upcoming July appointment, patient is notified. 

## 2021-07-05 ENCOUNTER — Encounter (HOSPITAL_COMMUNITY): Payer: Medicare HMO

## 2021-07-10 ENCOUNTER — Ambulatory Visit (HOSPITAL_COMMUNITY)
Admission: RE | Admit: 2021-07-10 | Discharge: 2021-07-10 | Disposition: A | Discharge status: Home / Self Care | Payer: Medicare HMO | Source: Ambulatory Visit | Attending: Internal Medicine | Admitting: Internal Medicine

## 2021-07-10 ENCOUNTER — Ambulatory Visit (INDEPENDENT_AMBULATORY_CARE_PROVIDER_SITE_OTHER): Payer: Medicare HMO | Admitting: Student

## 2021-07-10 ENCOUNTER — Encounter (HOSPITAL_COMMUNITY): Payer: Medicare HMO

## 2021-07-10 ENCOUNTER — Encounter: Payer: Self-pay | Admitting: Student

## 2021-07-10 ENCOUNTER — Other Ambulatory Visit: Payer: Self-pay

## 2021-07-10 VITALS — BP 163/84 | HR 79 | Temp 98.0°F | Resp 28 | Ht 64.0 in | Wt 173.5 lb

## 2021-07-10 DIAGNOSIS — M7989 Other specified soft tissue disorders: Secondary | ICD-10-CM | POA: Diagnosis not present

## 2021-07-10 DIAGNOSIS — R21 Rash and other nonspecific skin eruption: Secondary | ICD-10-CM | POA: Diagnosis not present

## 2021-07-10 DIAGNOSIS — M79604 Pain in right leg: Secondary | ICD-10-CM

## 2021-07-10 DIAGNOSIS — M25571 Pain in right ankle and joints of right foot: Secondary | ICD-10-CM | POA: Diagnosis not present

## 2021-07-10 DIAGNOSIS — R600 Localized edema: Secondary | ICD-10-CM

## 2021-07-10 MED ORDER — CEPHALEXIN 500 MG PO CAPS: 500.0000 mg | ORAL_CAPSULE | Freq: Four times a day (QID) | ORAL | 0 refills | Status: AC

## 2021-07-10 MED ORDER — BACLOFEN 10 MG PO TABS: 10.0000 mg | ORAL_TABLET | Freq: Two times a day (BID) | ORAL | 0 refills | Status: DC | PRN

## 2021-07-10 NOTE — Progress Notes (Signed)
   CC: right leg pain, back pain  HPI:  Kimberly Walters is a 71 y.o. female with history listed below presenting to the [facility] for right leg pain, back pain. Please see individualized problem based charting for full HPI.  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  Physical Exam:  Vitals:   07/10/21 1507  BP: (!) 155/71  Pulse: 82  Resp: (!) 28  Temp: 98 F (36.7 C)  TempSrc: Oral  SpO2: 96%  Weight: 173 lb 8 oz (78.7 kg)  Height: '5\' 4"'$  (1.626 m)   Physical Exam Constitutional:      Appearance: Normal appearance. She is not ill-appearing.  HENT:     Mouth/Throat:     Mouth: Mucous membranes are moist.     Pharynx: Oropharynx is clear.  Eyes:     Extraocular Movements: Extraocular movements intact.     Conjunctiva/sclera: Conjunctivae normal.     Pupils: Pupils are equal, round, and reactive to light.  Cardiovascular:     Rate and Rhythm: Normal rate and regular rhythm.     Pulses: Normal pulses.     Heart sounds: Normal heart sounds. No murmur heard.    No gallop.  Pulmonary:     Effort: Pulmonary effort is normal.     Breath sounds: Normal breath sounds. No wheezing or rales.  Abdominal:     General: Bowel sounds are normal. There is no distension.     Palpations: Abdomen is soft.     Tenderness: There is no abdominal tenderness.  Musculoskeletal:     Comments: Area of redness on right  lower extremity extending from right ankle to right mid-shin. No increased warmth but is tender to palpation in area of redness. 1+ swelling bilaterally up to mid-shins.   Lumbar paraspinal muscle tenderness on left lower back. No spinal point tenderness noted.  Neurological:     General: No focal deficit present.     Mental Status: She is alert and oriented to person, place, and time.  Psychiatric:        Mood and Affect: Mood normal.        Behavior: Behavior normal.       Assessment & Plan:   See Encounters Tab for problem based charting.  Patient discussed with Dr. Dareen Piano

## 2021-07-10 NOTE — Assessment & Plan Note (Signed)
Patient with complaints of continued right leg swelling and pain. She states that last week, she noticed an area of redness around her right ankle and slightly above ankle that has since spread up to mid-shin. She does have tenderness to palpation in that area and does have continued swelling there that appears to be essentially unchanged from what was noted during prior visit (remains 1+). She is very frustrated about her leg because she has not been able to ambulate since this developed. Daughter and patient are very concerned that this could be a blood clot or an ankle fracture. She has been able to bear weight on both legs and has been able to move around to cook/clean although she experiences some pain with doing so. She has had prior dopplers performed about a month ago that did not show a DVT in right leg and she has been taking her xarelto regularly.  Given patient is taking xarelto regularly and has had negative dopplers, low suspicion for DVT at this time. 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 Given area of redness that is spreading upwards along with tenderness in that area and swelling, will provide 1-week course of keflex to treat for possible cellulitis. ABI's performed in clinic today were normal (Left 1.19, right 1.20) so not concerned for PAD. Of note, some swelling may be chronic in nature given history of varicose veins.  Plan: -f/u right ankle x-ray -negative ABIs, continue compression stockings -1 week course of keflex for presumed cellulitis -f/u in 2 weeks

## 2021-07-10 NOTE — Patient Instructions (Signed)
Ms. Kemple,  It was a pleasure seeing you in the clinic today.   Leg pain: I have ordered ankle x-rays to make sure there is no fracture. I have ordered doppler studies to make sure the blood flow to your right leg is good. I have prescribed Keflex (antibiotic) to help with possible skin infection. Back pain: I have prescribed baclofen (muscle relaxant) - please be careful with driving as it can cause drowsiness. I have attached stretching exercises to help. Please use tylenol and heating pad as needed. Please come back in 2 weeks to make sure your pain is getting better.  Please call our clinic at [phone] if you have any questions or concerns. The best time to call is Monday-Friday from 9am-4pm, but there is someone available 24/7 at the same number. If you need medication refills, please notify your pharmacy one week in advance and they will send [place] a request.   Thank you for letting us take part in your care. We look forward to seeing you next time!

## 2021-07-12 ENCOUNTER — Encounter (HOSPITAL_COMMUNITY): Payer: Medicare HMO

## 2021-07-17 ENCOUNTER — Other Ambulatory Visit: Payer: Self-pay

## 2021-07-17 ENCOUNTER — Encounter (HOSPITAL_COMMUNITY): Payer: Medicare HMO

## 2021-07-17 ENCOUNTER — Encounter: Payer: Self-pay | Admitting: Student

## 2021-07-17 ENCOUNTER — Ambulatory Visit (INDEPENDENT_AMBULATORY_CARE_PROVIDER_SITE_OTHER): Payer: Medicare HMO | Admitting: Student

## 2021-07-17 ENCOUNTER — Other Ambulatory Visit: Payer: Self-pay | Admitting: Student

## 2021-07-17 ENCOUNTER — Ambulatory Visit (HOSPITAL_COMMUNITY)
Admission: RE | Admit: 2021-07-17 | Discharge: 2021-07-17 | Disposition: A | Discharge status: Home / Self Care | Payer: Medicare HMO | Source: Ambulatory Visit | Attending: Internal Medicine | Admitting: Internal Medicine

## 2021-07-17 VITALS — BP 144/82 | HR 76 | Temp 97.8°F | Ht 64.0 in | Wt 172.7 lb

## 2021-07-17 DIAGNOSIS — R21 Rash and other nonspecific skin eruption: Secondary | ICD-10-CM

## 2021-07-17 DIAGNOSIS — M79604 Pain in right leg: Secondary | ICD-10-CM

## 2021-07-17 MED ORDER — DOXYCYCLINE HYCLATE 100 MG PO CAPS: 100.0000 mg | ORAL_CAPSULE | Freq: Two times a day (BID) | ORAL | 0 refills | Status: DC

## 2021-07-17 NOTE — Assessment & Plan Note (Addendum)
Patient here for f/u of persistent RLE swelling, redness, and pain. She was seen 1 week ago for similar symptoms. Ankle x-rays obtained d/t concern for osteoporotic fracture were negative for fracture but showed moderate OA. ABIs were negative for PAD. Venous dopplers prior to visit today were negative for DVT in RLE. At last visit, she was also prescribed a 1-week course of keflex for presumed cellulitis/erisypelas. She states that she is on her last day of keflex today but has noted that her rash has spread even more and is warm to the touch. She denies any purulent drainage, fevers, chills, or other systemic symptoms since last visit.   On exam, her rash does seem to have spread proximally, distally, medially, and laterally in comparison to last visit 1 week ago. There is increased warmth present and TTP in area of redness. She does have some mild swelling but this is improved from prior visit. Her rash is mostly blanching but she does have some small petechiae that are non-blanching. No palpable purpura noted. I am reassured to know that she does not have PAD or a DVT that could be contributing to her symptoms. Given how quickly rash is spreading, this certainly could be an infectious cause (cellulitis/erisypelas) that is resistant to keflex. Given lack of systemic symptoms, it is appropriate to manage this as an outpatient currently. We will trial a 1-week course of doxycycline and assess response. Given some areas of nonblanching petechiae, will obtain ESR and CRP to ensure there is no worrisome underlying inflammatory condition, such as a vasculitis. If lack of improvement with doxycycline and/or significantly elevated inflammatory markers, will need to consider inpatient admission for possible biopsy.  Plan: -f/u CBC, ESR, CRP -doxycycline 112m BID x1 week -f/u in 1 week  ADDENDUM: Inflammatory markers within normal limits so less concerned for chronic inflammatory condition as cause of rash. CBC  without leukocytosis (may be masked by use of keflex for the past week). Advised patient to finish course of doxycyline and to come back next week to reevaluate for improvement of rash. She confirms understanding.

## 2021-07-18 LAB — CBC WITH DIFFERENTIAL/PLATELET
Basophils Absolute: 0.1 10*3/uL (ref 0.0–0.2)
Basos: 1 %
EOS (ABSOLUTE): 0.3 10*3/uL (ref 0.0–0.4)
Eos: 3 %
Hematocrit: 45.8 % (ref 34.0–46.6)
Hemoglobin: 15.3 g/dL (ref 11.1–15.9)
Immature Grans (Abs): 0 10*3/uL (ref 0.0–0.1)
Immature Granulocytes: 0 %
Lymphocytes Absolute: 1.4 10*3/uL (ref 0.7–3.1)
Lymphs: 15 %
MCH: 30.2 pg (ref 26.6–33.0)
MCHC: 33.4 g/dL (ref 31.5–35.7)
MCV: 91 fL (ref 79–97)
Monocytes Absolute: 1 10*3/uL — ABNORMAL HIGH (ref 0.1–0.9)
Monocytes: 11 %
Neutrophils Absolute: 6.2 10*3/uL (ref 1.4–7.0)
Neutrophils: 70 %
Platelets: 214 10*3/uL (ref 150–450)
RBC: 5.06 x10E6/uL (ref 3.77–5.28)
RDW: 13.1 % (ref 11.7–15.4)
WBC: 9 10*3/uL (ref 3.4–10.8)

## 2021-07-18 LAB — SEDIMENTATION RATE: Sed Rate: 2 mm/hr (ref 0–40)

## 2021-07-18 LAB — C-REACTIVE PROTEIN: CRP: 9 mg/L (ref 0–10)

## 2021-07-19 ENCOUNTER — Encounter (HOSPITAL_COMMUNITY): Payer: Medicare HMO

## 2021-07-23 ENCOUNTER — Other Ambulatory Visit: Payer: Self-pay | Admitting: Student

## 2021-07-24 NOTE — Progress Notes (Signed)
Internal Medicine Clinic Attending  Case discussed with Dr. Jinwala at the time of the visit.  We reviewed the resident's history and exam and pertinent patient test results.  I agree with the assessment, diagnosis, and plan of care documented in the resident's note.  

## 2021-07-25 ENCOUNTER — Encounter: Payer: Self-pay | Admitting: Student

## 2021-07-25 ENCOUNTER — Inpatient Hospital Stay: Payer: Medicare HMO | Attending: Oncology | Admitting: Oncology

## 2021-07-25 ENCOUNTER — Other Ambulatory Visit: Payer: Self-pay

## 2021-07-25 ENCOUNTER — Ambulatory Visit (INDEPENDENT_AMBULATORY_CARE_PROVIDER_SITE_OTHER): Payer: Medicare HMO | Admitting: Student

## 2021-07-25 VITALS — BP 141/78 | HR 72 | Temp 97.7°F | Resp 19 | Ht 64.0 in | Wt 170.6 lb

## 2021-07-25 VITALS — BP 132/72 | HR 76 | Temp 98.0°F

## 2021-07-25 DIAGNOSIS — L03115 Cellulitis of right lower limb: Secondary | ICD-10-CM | POA: Diagnosis not present

## 2021-07-25 DIAGNOSIS — Z7901 Long term (current) use of anticoagulants: Secondary | ICD-10-CM | POA: Diagnosis not present

## 2021-07-25 DIAGNOSIS — D6851 Activated protein C resistance: Secondary | ICD-10-CM | POA: Insufficient documentation

## 2021-07-25 DIAGNOSIS — Z86711 Personal history of pulmonary embolism: Secondary | ICD-10-CM | POA: Insufficient documentation

## 2021-07-25 DIAGNOSIS — I2694 Multiple subsegmental pulmonary emboli without acute cor pulmonale: Secondary | ICD-10-CM | POA: Diagnosis not present

## 2021-07-25 DIAGNOSIS — Z87891 Personal history of nicotine dependence: Secondary | ICD-10-CM | POA: Diagnosis not present

## 2021-07-25 DIAGNOSIS — J439 Emphysema, unspecified: Secondary | ICD-10-CM | POA: Diagnosis not present

## 2021-07-25 DIAGNOSIS — R233 Spontaneous ecchymoses: Secondary | ICD-10-CM

## 2021-07-25 MED ORDER — FLUTICASONE PROPIONATE HFA 44 MCG/ACT IN AERO: 2.0000 | INHALATION_SPRAY | Freq: Two times a day (BID) | RESPIRATORY_TRACT | 1 refills | Status: DC

## 2021-07-25 MED ORDER — ANORO ELLIPTA 62.5-25 MCG/ACT IN AEPB: 1.0000 | INHALATION_SPRAY | Freq: Every day | RESPIRATORY_TRACT | 1 refills | Status: DC

## 2021-07-25 NOTE — Assessment & Plan Note (Addendum)
Patient is here for 1 week follow-up from her last visit for right lower extremity cellulitis.  She has finished a 7-day course of doxycycline yesterday.  Overall patient reports that the erythema and edema has improved compared to her last picture.  She still has pain at her distal tibia and her right ankle, especially with ankle flexion.  Reports the erythema did not spread past the border from last time.  Denies fever or chills. Skin is warm to touch.  There is still trace to +1 pitting edema.  There are chronic nonblanchable petechia bilaterally which could be due to venous stasis.  I think she has received an appropriate course of effective antibiotic.  It would take time for the edema and pain to slowly resolve.  Low suspicion for serious infection such as osteomyelitis.  ESR/CRP were within normal limits.  No leukocytosis on CBC.  -No additional antibiotic indicated -Follow-up in 4 weeks to ensure resolution of her symptoms

## 2021-07-25 NOTE — Progress Notes (Signed)
CC: 2 weeks follow-up on right lower extremity rash  HPI:  [name] is a 71 y.o. with past medical history of hypertension, PE on Xarelto, COPD who presents to the clinic for her 2 weeks follow-up on her right lower extremity rash.  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 HENT:     Head: Normocephalic.  Eyes:     General:        Right eye: No discharge.        Left eye: No discharge.     Conjunctiva/sclera: Conjunctivae normal.  Pulmonary:     Effort: Pulmonary effort is normal.     Comments: Mild expiratory wheezing heard in all 4 lung fields Skin:    General: Skin is warm.     Comments: Right lower extremity edematous and erythematous up to proximal tibia but improved compared to prior picture.  Skin is warm to touch.  The erythema did not spread from past the border from last time.  There is trace to +1 pitting edema.  She has pain at the ankle with range of motion, worse with ankle flexion. There are nonblanchable petechiae seen at bilateral lower extremities.  Neurological:     Mental Status: She is alert and oriented to person, place, and time.  Psychiatric:        Mood and Affect: Mood normal.       Assessment & Plan:   See Encounters Tab for problem based charting.  Cellulitis Patient is here for 1 week follow-up from her last visit for right lower extremity cellulitis.  She has finished a 7-day course of doxycycline yesterday.  Overall patient reports that the erythema and edema has improved compared to her last picture.  She still has pain at her distal tibia and her right ankle, especially with ankle flexion.  Reports the erythema did not spread past the border from last time.  Denies fever or chills. Skin is warm to touch.  There is still trace to +1 pitting edema.  There are chronic nonblanchable petechia bilaterally which could be due to venous stasis.  I think she has received an appropriate course of effective antibiotic.  It would take time for the edema and pain to slowly resolve.  Low suspicion for serious infection such as osteomyelitis.  ESR/CRP were within normal limits.  No leukocytosis on CBC.  -No additional antibiotic indicated -Follow-up in 4 weeks to ensure resolution of her symptoms   COPD (chronic obstructive pulmonary disease) with emphysema ([place]) Patient report that she has stopped using her inhalers about 1 month ago due to the cost of the medications.  She still has her albuterol inhaler as needed.  Said that she no longer smokes.  She will need 3 medications combination given her last hospitalization for COPD exacerbation in January.  -Stop Symbicort and Spiriva -Start Anoro Ellipta and Flovent.  These 2 medications should be covered under [facility] plan.  Patient will let me know if they are not.   Patient discussed with Dr. Jimmye Norman

## 2021-07-25 NOTE — Patient Instructions (Signed)
[name],  It was a pleasure seeing you in the clinic today.  Here is a summary what we talked about:  1.  Skin infection: I am glad that your symptoms are improving.  It would take some time for the swelling and the pain to improve.  2.  COPD: I sent in 2 new prescriptions of Anoro Ellipta and Pulmicort.  Please let me know if they are not covered.  Please come back in 4 weeks so we can reevaluate your leg.  You can cancel this appointment if your symptoms are completely resolved at that time  Take care  Dr. Alfonse Spruce

## 2021-07-25 NOTE — Assessment & Plan Note (Signed)
Patient report that she has stopped using her inhalers about 1 month ago due to the cost of the medications.  She still has her albuterol inhaler as needed.  Said that she no longer smokes.  She will need 3 medications combination given her last hospitalization for COPD exacerbation in January.  -Stop Symbicort and Spiriva -Start Anoro Ellipta and Flovent.  These 2 medications should be covered under [facility] plan.  Patient will let me know if they are not.

## 2021-07-25 NOTE — Progress Notes (Signed)
Hematology and Oncology Follow Up Visit  Kimberly Walters [id number] 08-25-1950 71 y.o. 07/25/2021 9:54 AM Kimberly Cross, DO   Principle Diagnosis: 71 year old woman with heterozygous factor V Leiden mutation diagnosed in June 2021.  She had recurrent thrombosis including DVT and PE there is some unprovoked.     Prior Therapy:  She was treated with Eliquis initially but developed PE in June 2021 while she was on Eliquis suggesting possible breakthrough thrombosis.  Lovenox 80 mg twice a day started in June 2021.  Therapy switched to Xarelto in November 2021.  Current therapy:  Xarelto 20 mg daily started in November 2021.   Interim History: Kimberly Walters is here for repeat follow-up.  Since last visit, she reports no major changes in her health.  She denies any bleeding complications.  She denies any hematochezia, melena or hemoptysis.  She denies any hospitalizations or illnesses.  She continues to have respiratory complaints occasional wheezing which has improved since stop smoking.    Medications: Reviewed without changes.  Current Outpatient Medications  Medication Sig Dispense Refill   acetaminophen (TYLENOL) 500 MG tablet Take 500 mg by mouth every 6 (six) hours as needed for moderate pain.     albuterol (VENTOLIN HFA) 108 (90 Base) MCG/ACT inhaler Inhale 2 puffs into the lungs every 6 (six) hours as needed for wheezing or shortness of breath. 8 g 2   baclofen (LIORESAL) 10 MG tablet Take 1 tablet (10 mg total) by mouth 2 (two) times daily as needed for muscle spasms. 30 tablet 0   budesonide-formoterol (SYMBICORT) 80-4.5 MCG/ACT inhaler Inhale 2 puffs into the lungs daily. (Patient not taking: Reported on 05/23/2021) 1 each 12   cholecalciferol (VITAMIN D) 25 MCG (1000 UNIT) tablet TAKE 1 TABLET EVERY DAY (Patient taking differently: 1,000 Units daily.) 90 tablet 3   doxycycline (VIBRAMYCIN) 100 MG capsule Take 1 capsule (100 mg total) by mouth 2 (two) times daily. 14  capsule 0   lisinopril (ZESTRIL) 20 MG tablet TAKE 1 TABLET EVERY DAY (Patient taking differently: Take 20 mg by mouth daily.) 90 tablet 3   pravastatin (PRAVACHOL) 10 MG tablet TAKE 1 TABLET EVERY DAY (Patient taking differently: Take 10 mg by mouth daily.) 90 tablet 3   Tiotropium Bromide Monohydrate (SPIRIVA RESPIMAT) 2.5 MCG/ACT AERS Inhale 2 puffs into the lungs daily. 4 g 2   XARELTO 20 MG TABS tablet TAKE 1 TABLET(20 MG) BY MOUTH DAILY WITH SUPPER 30 tablet 3   No current facility-administered medications for this visit.     Allergies: No Known Allergies    Physical Exam:   Blood pressure (!) 141/78, pulse 72, temperature 97.7 F (36.5 C), temperature source Oral, resp. rate 19, height '5\' 4"'$  (1.626 m), weight 170 lb 9.6 oz (77.4 kg), last menstrual period 06/21/1973, SpO2 92 %.    ECOG: 1    General appearance: Alert, awake without any distress. Head: Atraumatic without abnormalities Oropharynx: Without any thrush or ulcers. Eyes: No scleral icterus. Lymph nodes: No lymphadenopathy noted in the cervical, supraclavicular, or axillary nodes Heart:regular rate and rhythm, without any murmurs or gallops.   Lung: Clear to auscultation without any rhonchi, wheezes or dullness to percussion. Abdomin: Soft, nontender without any shifting dullness or ascites. Musculoskeletal: No clubbing or cyanosis. Neurological: No motor or sensory deficits. Skin: No rashes or lesions.      Lab Results: Lab Results  Component Value Date   WBC 9.0 07/17/2021   HGB 15.3 07/17/2021   HCT 45.8 07/17/2021  MCV 91 07/17/2021   PLT 214 07/17/2021     Chemistry      Component Value Date/Time   NA 142 02/03/2021 0121   NA 141 11/16/2018 1403   K 3.8 02/03/2021 0121   CL 102 02/03/2021 0121   CO2 25 02/03/2021 0121   BUN 21 02/03/2021 0121   BUN 17 11/16/2018 1403   CREATININE 1.05 (H) 02/03/2021 0121      Component Value Date/Time   CALCIUM 9.4 02/03/2021 0121   ALKPHOS 56  07/18/2020 0656   AST 25 07/18/2020 0656   ALT 15 07/18/2020 0656   BILITOT 1.4 (H) 07/18/2020 0656   BILITOT 0.3 11/16/2018 1403       Impression and Plan:  71 year old woman with:   1.   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   Risks and benefits of continuing Xarelto lifetime were discussed at this time.  Complications that include bleeding among others were reiterated.  Given her high risk of recurrent thrombosis and factor V Leiden recommended lifetime anticoagulation.  She is agreeable at this time.      2.  Follow-up: In 1 year for repeat follow-up.     20  minutes were spent on this visit.  The time was dedicated to updating her disease status, treatment choices and complication related to her therapy.   Zola Button, MD 7/5/20239:54 AM

## 2021-07-26 ENCOUNTER — Encounter (HOSPITAL_COMMUNITY): Payer: Medicare HMO

## 2021-07-26 NOTE — Addendum Note (Signed)
Addended byGaylan Gerold on: 07/26/2021 08:51 AM   Modules accepted: Orders

## 2021-07-31 NOTE — Progress Notes (Signed)
Internal Medicine Clinic Attending  I saw and evaluated the patient.  I personally confirmed the key portions of the history and exam documented by Dr. Nguyen and I reviewed pertinent patient test results.  The assessment, diagnosis, and plan were formulated together and I agree with the documentation in the resident's note.\  

## 2021-08-01 ENCOUNTER — Telehealth: Payer: Self-pay

## 2021-08-01 NOTE — Telephone Encounter (Signed)
Pt states her left leg are swollen, red and painful. Requesting to speak with a nurse, please call back.

## 2021-08-01 NOTE — Telephone Encounter (Signed)
Returned call to patient. States she was seen for this last week and feels redness has lessened but pain has worsened. Denies SHOB. She was given first available appt tomorrow at 2:15.

## 2021-08-02 ENCOUNTER — Ambulatory Visit (INDEPENDENT_AMBULATORY_CARE_PROVIDER_SITE_OTHER): Payer: Medicare HMO | Admitting: Student

## 2021-08-02 ENCOUNTER — Encounter: Payer: Self-pay | Admitting: Student

## 2021-08-02 ENCOUNTER — Other Ambulatory Visit: Payer: Self-pay

## 2021-08-02 VITALS — BP 123/74 | HR 83 | Temp 97.9°F | Ht 64.0 in | Wt 174.2 lb

## 2021-08-02 DIAGNOSIS — L03115 Cellulitis of right lower limb: Secondary | ICD-10-CM | POA: Diagnosis not present

## 2021-08-02 DIAGNOSIS — J439 Emphysema, unspecified: Secondary | ICD-10-CM | POA: Diagnosis not present

## 2021-08-02 DIAGNOSIS — Z87891 Personal history of nicotine dependence: Secondary | ICD-10-CM | POA: Diagnosis not present

## 2021-08-02 DIAGNOSIS — I10 Essential (primary) hypertension: Secondary | ICD-10-CM

## 2021-08-02 DIAGNOSIS — Z0000 Encounter for general adult medical examination without abnormal findings: Secondary | ICD-10-CM

## 2021-08-02 DIAGNOSIS — Z23 Encounter for immunization: Secondary | ICD-10-CM | POA: Diagnosis not present

## 2021-08-02 MED ORDER — FLUTICASONE FUROATE-VILANTEROL 100-25 MCG/ACT IN AEPB: 1.0000 | INHALATION_SPRAY | Freq: Every day | RESPIRATORY_TRACT | 2 refills | Status: DC

## 2021-08-02 MED ORDER — SPIRIVA RESPIMAT 2.5 MCG/ACT IN AERS: 2.0000 | INHALATION_SPRAY | Freq: Every day | RESPIRATORY_TRACT | 2 refills | Status: DC

## 2021-08-02 NOTE — Assessment & Plan Note (Signed)
Patient is here for 1 week follow-up for right lower leg cellulitis.  She states that the erythema has almost resolved.  She endorses persistent swelling and pain mostly in her ankle.  Described pain as burning and shooting.  States that pain does not affect her function and work.  Rates pain 6/10.  She mostly has pain after long hours of standing and walking.  She tried to elevate her legs as much as she can.  She is currently taking Tylenol 500 mg twice daily for pain.  Physical exam revealed almost resolved erythema of right lower leg.  There is still trace edema up to mid shin.  Pain mostly located in the ankle but also on her calf.  Pain is worst with dorsiflexion.  Overall her cellulitis is healing properly.  No signs of active infection at this time.  Her edema and pain will take time to resolve.  Advised patient to use compression sock to help with edema.  She can increase her Tylenol up to 3g a day.  Will advise her to take NSAIDs based on BMP results.

## 2021-08-02 NOTE — Assessment & Plan Note (Signed)
Blood pressures well controlled. -Continue lisinopril 20 mg -Recheck BMP

## 2021-08-02 NOTE — Assessment & Plan Note (Addendum)
Unfortunately an Anoro Ellipta and Flovent were not covered.  She just picked up a refill of her Symbicort and Spiriva for $200+ /30-day supply.  -Prescribed Breo Ellipta and Spiriva Respimat.  These are under the preferred drug list with [facility]

## 2021-08-02 NOTE — Patient Instructions (Signed)
Ms. Knipfer,  It was nice seeing you in the clinic today.  Overall I think your leg infection has almost resolved.  The residual swelling and pain would take time to go away.  Please try to wear a compression sock when you standing up and walking.  Please try to elevate your leg when you sit down.  You can take Tylenol up to 3000 mg (6 tablets of 500 mg strength tablets) a day.  I will also check your kidney function today.  Please return in 3 months, sooner if needed  Take care  Dr. Alfonse Spruce

## 2021-08-02 NOTE — Progress Notes (Signed)
CC: 1 week follow-up for right leg cellulitis  HPI:  KimberlyKimberly Walters is a 71 y.o. hypertension, COPD, PE and factor V [place] on Xarelto who presents to clinic today for 1 week follow-up for right leg cellulitis.  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    Gross hematuria    Hypertension    Mitral valve prolapse    Paroxysmal atrial fibrillation with RVR (HCC)    Pleural effusion on left    Post-thrombotic syndrome of left lower extremity    Primary hypercoagulable state ([place])    Pulmonary emboli ([place])    Pyelonephritis    Upper respiratory infection, viral 04/02/2021   Review of Systems:  per HPI  Physical Exam:  Vitals:   08/02/21 1422  BP: 123/74  Pulse: 83  Temp: 97.9 F (36.6 C)  TempSrc: Oral  SpO2: 95%  Weight: 174 lb 3.2 oz (79 kg)  Height: '5\' 4"'$  (1.626 m)   Physical Exam Constitutional:      General: She is not in acute distress.    Appearance: She is not ill-appearing.  HENT:     Head: Normocephalic.  Eyes:     General:        Right eye: No discharge.        Left eye: No discharge.     Conjunctiva/sclera: Conjunctivae normal.  Pulmonary:     Effort: Pulmonary effort is normal. No respiratory distress.  Musculoskeletal:     Cervical back: Normal range of motion.  Skin:    Comments: Erythema on right lower leg continues to improve.  There is still trace edema up to mid shin.  Mild tenderness to palpation at the calf and right ankle.  She endorses most pain with ankle dorsiflexion.  Neurological:     General: No focal deficit present.     Mental Status: She is alert.  Psychiatric:        Mood and Affect: Mood normal.       Assessment & Plan:   See Encounters Tab for problem based charting.  Cellulitis Patient is here for 1 week  follow-up for right lower leg cellulitis.  She states that the erythema has almost resolved.  She endorses persistent swelling and pain mostly in her ankle.  Described pain as burning and shooting.  States that pain does not affect her function and work.  Rates pain 6/10.  She mostly has pain after long hours of standing and walking.  She tried to elevate her legs as much as she can.  She is currently taking Tylenol 500 mg twice daily for pain.  Physical exam revealed almost resolved erythema of right lower leg.  There is still trace edema up to mid shin.  Pain mostly located in the ankle but also on her calf.  Pain is worst with dorsiflexion.  Overall her cellulitis is healing properly.  No signs of active infection at this time.  Her edema and pain will take time to resolve.  Advised patient to use compression sock to help with edema.  She can increase her Tylenol up to 3g a day.  Will advise her to take NSAIDs based on BMP results.  Hypertension Blood pressures well controlled. -Continue lisinopril 20 mg -Recheck BMP  COPD (chronic obstructive pulmonary disease) with emphysema ([place])  Unfortunately an Anoro Ellipta and Flovent were not covered.  She just picked up a refill of her Symbicort and Spiriva for $200+ /30-day supply.  -Prescribed Breo Ellipta and Spiriva Respimat.  These are under the preferred drug list with [place] [facility] - Tdap vaccine given today -Pneumonia shot next visit   Patient discussed with Dr. Jimmye Norman

## 2021-08-02 NOTE — Assessment & Plan Note (Signed)
-   Tdap vaccine given today -Pneumonia shot next visit

## 2021-08-03 ENCOUNTER — Other Ambulatory Visit: Payer: Self-pay | Admitting: Oncology

## 2021-08-04 LAB — BMP8+ANION GAP
Anion Gap: 15 mmol/L (ref 10.0–18.0)
BUN/Creatinine Ratio: 20 (ref 12–28)
BUN: 19 mg/dL (ref 8–27)
CO2: 23 mmol/L (ref 20–29)
Calcium: 9.7 mg/dL (ref 8.7–10.3)
Chloride: 105 mmol/L (ref 96–106)
Creatinine, Ser: 0.95 mg/dL (ref 0.57–1.00)
Glucose: 88 mg/dL (ref 70–99)
Potassium: 4.7 mmol/L (ref 3.5–5.2)
Sodium: 143 mmol/L (ref 134–144)
eGFR: 64 mL/min/{1.73_m2} (ref >59)

## 2021-08-11 NOTE — Progress Notes (Signed)
Internal Medicine Clinic Attending  Case discussed with Dr. Nguyen  At the time of the visit.  We reviewed the resident's history and exam and pertinent patient test results.  I agree with the assessment, diagnosis, and plan of care documented in the resident's note. 

## 2021-08-22 ENCOUNTER — Ambulatory Visit (INDEPENDENT_AMBULATORY_CARE_PROVIDER_SITE_OTHER): Payer: Medicare HMO | Admitting: Internal Medicine

## 2021-08-22 ENCOUNTER — Encounter: Payer: Self-pay | Admitting: Internal Medicine

## 2021-08-22 DIAGNOSIS — I10 Essential (primary) hypertension: Secondary | ICD-10-CM

## 2021-08-22 DIAGNOSIS — Z87891 Personal history of nicotine dependence: Secondary | ICD-10-CM

## 2021-08-22 DIAGNOSIS — L03115 Cellulitis of right lower limb: Secondary | ICD-10-CM

## 2021-08-22 DIAGNOSIS — M62830 Muscle spasm of back: Secondary | ICD-10-CM

## 2021-08-22 MED ORDER — LISINOPRIL 20 MG PO TABS
20.0000 mg | ORAL_TABLET | Freq: Every day | ORAL | 3 refills | Status: DC
Start: 2021-08-22 — End: 2022-01-04

## 2021-08-22 NOTE — Patient Instructions (Addendum)
Kimberly Walters,  It was a pleasure to care for you today! I am glad that your leg infection has continued to clear up. Your right leg looks better with each visit and I expect that the swelling, redness, and aching will continue to improve with time.  For your left-sided back and hip pain you can take ibuprofen 400 mg as needed to help with the pain. I believe that increasing your activity and strengthening the muscles in your legs and core will also help alleviate your pain over time.  We will see you for a routine follow-up in October. If you need [place] before then, please do not hesitate to reach out to the clinic!  My best, Dr. Marlou Sa

## 2021-08-22 NOTE — Assessment & Plan Note (Signed)
Patient returns today for 4-week follow up of distal RLE cellulitis. She has had extensive work-up in this time including ABIs which were normal, x-ray, and has received antibiotic therapy. The erythema and edema have gradually improved and are nearly fully resolved at this time. Pain previously reported over proximal dorsal R foot is now more of an ache that is tolerable.  Assessment:Area of previous cellulitis does not appear acutely infected. She has continued improvement with time of discomfort, edema, and erythema. Plan:Patient advised to RTC if she develops worsening edema, erythema, warmth, or pain.

## 2021-08-22 NOTE — Progress Notes (Signed)
Internal Medicine Clinic Attending ? ?Case discussed with Dr. Dean  At the time of the visit.  We reviewed the resident?s history and exam and pertinent patient test results.  I agree with the assessment, diagnosis, and plan of care documented in the resident?s note.  ?

## 2021-08-22 NOTE — Assessment & Plan Note (Signed)
BP well controlled at 133/59. Patient is in need of lisinopril refill. Plan:Continue lisinopril 20 mg daily. Refill sent in.

## 2021-08-22 NOTE — Assessment & Plan Note (Signed)
Patient reports occasional L lumbar discomfort specifically when she lays on her L side to sleep at night. She has not felt much relief with tylenol or flexeril. She is not limited in her ADL/IADLs due to the discomfort. Assessment:Likely MSK in nature. No recent trauma or injury, no falls. Plan:Patient advised that she can take 400 mg ibuprofen as needed for the pain. She can also continue using topical gels which have helped provide relief to this time. I encouraged her to work on increasing her physical activity levels as tolerated.

## 2021-08-22 NOTE — Progress Notes (Signed)
   CC: 4-week cellulitis follow-up  HPI:  Kimberly Walters is a 71 y.o. female with past medical history as detailed below who presents for 4-week follow up of distal RLE cellulitis. Please see problem based charting for detailed assessment and plan.  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  Physical Exam:  Vitals:   08/22/21 1036  BP: 133/69  Pulse: 65  Temp: 98 F (36.7 C)  TempSrc: Oral  SpO2: 93%  Weight: 174 lb 12.8 oz (79.3 kg)  Height: '5\' 4"'$  (1.626 m)   Constitutional:Well-appearing, in no acute distress. Cardio:Regular rate and rhythm. No murmurs, rubs, gallops. Pulm:Clear to auscultation bilaterally. Normal work of breathing on room air. MSK:0-1+pitting edema to mid-calf on RLE. Minimal erythema to the area. No increased warmth.  Skin:Warm and dry. Venous stasis changes over distal RLE with minimal erythema as described above. Neuro:Alert and oriented x3. No focal deficit noted. Psych:Pleasant mood and affect.     Assessment & Plan:   See Encounters Tab for problem based charting.  Hypertension BP well controlled at 133/59. Patient is in need of lisinopril refill. Plan:Continue lisinopril 20 mg daily. Refill sent in.  Cellulitis Patient returns today for 4-week follow up of distal RLE cellulitis. She has had extensive work-up in this time including ABIs which were normal, x-ray, and has received antibiotic therapy. The  erythema and edema have gradually improved and are nearly fully resolved at this time. Pain previously reported over proximal dorsal R foot is now more of an ache that is tolerable.  Assessment:Area of previous cellulitis does not appear acutely infected. She has continued improvement with time of discomfort, edema, and erythema. Plan:Patient advised to RTC if she develops worsening edema, erythema, warmth, or pain.   Lumbar paraspinal muscle spasm Patient reports occasional L lumbar discomfort specifically when she lays on her L side to sleep at night. She has not felt much relief with tylenol or flexeril. She is not limited in her ADL/IADLs due to the discomfort. Assessment:Likely MSK in nature. No recent trauma or injury, no falls. Plan:Patient advised that she can take 400 mg ibuprofen as needed for the pain. She can also continue using topical gels which have helped provide relief to this time. I encouraged her to work on increasing her physical activity levels as tolerated.   Patient discussed with Dr. Evette Doffing

## 2021-08-27 ENCOUNTER — Ambulatory Visit: Payer: Self-pay | Admitting: Licensed Clinical Social Worker

## 2021-08-27 NOTE — Patient Outreach (Signed)
  Care Coordination   Initial Visit Note   08/27/2021 Name: Kimberly Walters MRN: [id number] DOB: [birth year]  Kimberly Walters is a 71 y.o. year old female who sees Farrel Gordon, DO for primary care. I spoke with  Kimberly Walters by phone today  What matters to the patients health and wellness today?  SW assisted with patients needs and advised patient to contact Clinic if patient wants to see a nurse. Patient declined needing a nurse to outreach at this time.Patient requested assistance with an inhaler. Patient stated the office was supposed to follow up with her. SW sent message to careteam.  SW removed self from care team and advised patient to contact PCP if SW assistance is needed in the future.    SDOH assessments and interventions completed:  Yes     Care Coordination Interventions Activated:  Yes  Care Coordination Interventions:  Yes, provided   Follow up plan: No further intervention required.   Encounter Outcome:  Pt. Visit Completed   Lenor Derrick , MSW Social Worker [facility] Care Management  [phone]

## 2021-09-13 ENCOUNTER — Ambulatory Visit: Payer: Medicare HMO | Admitting: Licensed Clinical Social Worker

## 2021-09-13 NOTE — Patient Outreach (Signed)
  Care Coordination   Follow Up Visit Note   09/13/2021 Name: Kimberly Walters MRN: [id number] DOB: 10/20/1950  Kimberly Walters is a 71 y.o. year old female who sees Farrel Gordon, DO for primary care. I spoke with  Kimberly Walters by phone today  What matters to the patients health and wellness today?  Patient has medication and all goals have been met. SW collaborated with PCP to assure patient received medications.    Goals Addressed   None     SDOH assessments and interventions completed:  Yes     Care Coordination Interventions Activated:  Yes  Care Coordination Interventions:  Yes, provided   Follow up plan: No further intervention required.   Encounter Outcome:  Pt. Visit Completed   Lenor Derrick, MSW  Social Worker [facility] Care Management  [phone]

## 2021-09-13 NOTE — Patient Instructions (Signed)
Visit Information   Patient was given the following information about care management and care coordination services today, agreed to services, and gave verbal consent: 1.care management/care coordination services include personalized support from designated clinical staff supervised by their physician, including individualized plan of care and coordination with other care providers 2. 24/7 contact phone numbers for assistance for urgent and routine care needs. 3. The patient may stop care management/care coordination services at any time by phone call to the office staff.  Patient verbalizes understanding of instructions and care plan provided today and agrees to view in [place]. Active MyChart status and patient understanding of how to access instructions and care plan via MyChart confirmed with patient.     No further follow up required: .  Lenor Derrick , MSW Social Worker [facility]  [phone]

## 2021-09-27 ENCOUNTER — Telehealth: Payer: Self-pay

## 2021-09-27 NOTE — Telephone Encounter (Signed)
Pt is requesting a call back .[name] She stated that she called a few days ago  and this Tuesday  9/5 about getting assistance  for her med .... I told pt about the IM program and she is fine with changing her pharmacy if needed ...    Pt is also going to be out of one of the inhalers  soon

## 2021-09-27 NOTE — Telephone Encounter (Signed)
Returned call to patient. States she is in the donut hole with her insurance. Spiriva and Breo are > $200 per month which she cannot afford. Verified with Margreta Journey at [facility] that IM program cannot help anyone with insurance. Patient has checked with [employer] and it is even more expensive. Will route to [facility] to see if there are any other options.

## 2021-10-01 NOTE — Telephone Encounter (Signed)
Spoke to pt regarding patient assistance options she has for Spiriva and Breo. Pt has high copay concerns for the majority of her meds. Informed patient to apply for Low Income Subsidy/ Extra help with Social Security for right now (Gave their phone number [phone]). If she is approved, this could help her copays. If denied, she should call the office back and speak with me for further assistance. Pt expressed understanding.

## 2021-10-04 ENCOUNTER — Other Ambulatory Visit: Payer: Self-pay

## 2021-10-04 DIAGNOSIS — I700 Atherosclerosis of aorta: Secondary | ICD-10-CM

## 2021-10-04 MED ORDER — PRAVASTATIN SODIUM 10 MG PO TABS: 10.0000 mg | ORAL_TABLET | Freq: Every day | ORAL | 3 refills | Status: DC

## 2021-10-04 MED ORDER — VITAMIN D3 25 MCG (1000 UNIT) PO TABS: 1000.0000 [IU] | ORAL_TABLET | Freq: Every day | ORAL | 3 refills | Status: DC

## 2021-10-18 ENCOUNTER — Ambulatory Visit (INDEPENDENT_AMBULATORY_CARE_PROVIDER_SITE_OTHER): Payer: Medicare HMO | Admitting: Internal Medicine

## 2021-10-18 ENCOUNTER — Encounter: Payer: Self-pay | Admitting: Internal Medicine

## 2021-10-18 VITALS — BP 139/75 | HR 88 | Temp 98.2°F | Wt 174.0 lb

## 2021-10-18 DIAGNOSIS — E559 Vitamin D deficiency, unspecified: Secondary | ICD-10-CM

## 2021-10-18 DIAGNOSIS — Z1159 Encounter for screening for other viral diseases: Secondary | ICD-10-CM | POA: Insufficient documentation

## 2021-10-18 DIAGNOSIS — I700 Atherosclerosis of aorta: Secondary | ICD-10-CM | POA: Diagnosis not present

## 2021-10-18 DIAGNOSIS — I10 Essential (primary) hypertension: Secondary | ICD-10-CM | POA: Diagnosis not present

## 2021-10-18 DIAGNOSIS — Z87891 Personal history of nicotine dependence: Secondary | ICD-10-CM | POA: Diagnosis not present

## 2021-10-18 DIAGNOSIS — R7303 Prediabetes: Secondary | ICD-10-CM | POA: Diagnosis not present

## 2021-10-18 LAB — POCT GLYCOSYLATED HEMOGLOBIN (HGB A1C): Hemoglobin A1C: 6.1 % — AB (ref 4.0–5.6)

## 2021-10-18 LAB — GLUCOSE, CAPILLARY: Glucose-Capillary: 120 mg/dL — ABNORMAL HIGH (ref 70–99)

## 2021-10-18 MED ORDER — VITAMIN D3 25 MCG (1000 UNIT) PO TABS: 1000.0000 [IU] | ORAL_TABLET | Freq: Every day | ORAL | 3 refills | Status: DC

## 2021-10-18 NOTE — Assessment & Plan Note (Signed)
Last lipid panel 07/2020 showed LDL at goal at 70. Current regimen is pravastatin 10 mg daily. Plan:Recheck lipid panel today. Continue pravastatin 10 mg daily.

## 2021-10-18 NOTE — Assessment & Plan Note (Signed)
HbA1c 6.1% from 6.4% 06/2020. Plan:Continue to encourage active lifestyle and healthy diet choices.

## 2021-10-18 NOTE — Assessment & Plan Note (Signed)
Hepatitis C screen obtained today.

## 2021-10-18 NOTE — Assessment & Plan Note (Signed)
BP at goal today, 139/75. Current regimen is lisinopril 20 mg daily. Plan:Continue lisinopril 20 mg daily.

## 2021-10-18 NOTE — Progress Notes (Signed)
   CC: follow-up  HPI:  [name] is a 71 y.o. person with past medical history as detailed below who presents for routine check up. She does endorse continuing to struggle with medication costs but has been in discussion with her insurance company about this. Please see problem based charting for detailed assessment and plan.  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  Physical Exam:  Vitals:   10/18/21 1036  BP: 139/75  Pulse: 88  Temp: 98.2 F (36.8 C)  TempSrc: Oral  SpO2: 96%  Weight: 174 lb (78.9 kg)   Constitutional:Well appearing. In no acute distress. Cardio:Regular rate and rhythm. No murmurs, rubs, or gallops. Pulm:Clear to auscultation bilaterally. Normal work of breathing on room air. XKG:YJEHUDJS for extremity edema. Skin:Warm and dry. Discoloration noted over bilateral ankles, unchanged from prior, without increased warmth or edema. Neuro:Alert and oriented x3. No focal deficit noted. Psych:Pleasant mood and affect.  Assessment & Plan:   See Encounters Tab for problem based charting.  Aortic atherosclerosis ([facility]) Last lipid panel 07/2020 showed LDL at goal at 70. Current regimen is pravastatin 10 mg daily. Plan:Recheck lipid panel today. Continue pravastatin 10 mg daily.  Prediabetes HbA1c 6.1% from 6.4% 06/2020. Plan:Continue to encourage active lifestyle and healthy diet choices.  Need for hepatitis C  screening test Hepatitis C screen obtained today.  Hypertension BP at goal today, 139/75. Current regimen is lisinopril 20 mg daily. Plan:Continue lisinopril 20 mg daily.  Patient discussed with Dr.  Saverio Walters

## 2021-10-18 NOTE — Patient Instructions (Signed)
Ms. Galka,  It was a pleasure to see you again today. You are doing great!  I am not making any changes to your medications today. I am checking some labs and will let you know what those results show.  Plan to return for a check-up in about 3 months.  My best, Dr. Marlou Sa

## 2021-10-19 LAB — LIPID PANEL
Chol/HDL Ratio: 4.3 ratio (ref 0.0–4.4)
Cholesterol, Total: 183 mg/dL (ref 100–199)
HDL: 43 mg/dL (ref >39)
LDL Chol Calc (NIH): 101 mg/dL — ABNORMAL HIGH (ref 0–99)
Triglycerides: 227 mg/dL — ABNORMAL HIGH (ref 0–149)
VLDL Cholesterol Cal: 39 mg/dL (ref 5–40)

## 2021-10-19 LAB — HCV INTERPRETATION

## 2021-10-19 LAB — HCV AB W REFLEX TO QUANT PCR: HCV Ab: NONREACTIVE

## 2021-10-23 NOTE — Progress Notes (Signed)
Internal Medicine Clinic Attending ? ?Case discussed with Dr. Dean  At the time of the visit.  We reviewed the resident?s history and exam and pertinent patient test results.  I agree with the assessment, diagnosis, and plan of care documented in the resident?s note.  ?

## 2021-10-24 ENCOUNTER — Telehealth: Payer: Self-pay | Admitting: Internal Medicine

## 2021-10-24 ENCOUNTER — Encounter: Payer: Self-pay | Admitting: Internal Medicine

## 2021-10-24 NOTE — Telephone Encounter (Signed)
Attempted to contact patient regarding lab results from her recent OV however there was no answer and the voicemail had not been set up so I was unable to leave a message. I will print and send a letter for her with these results.  Farrel Gordon, DO

## 2021-12-20 ENCOUNTER — Other Ambulatory Visit: Payer: Self-pay | Admitting: Oncology

## 2021-12-20 ENCOUNTER — Other Ambulatory Visit: Payer: Self-pay | Admitting: *Deleted

## 2021-12-20 ENCOUNTER — Other Ambulatory Visit: Payer: Self-pay

## 2021-12-20 DIAGNOSIS — J439 Emphysema, unspecified: Secondary | ICD-10-CM

## 2021-12-20 MED ORDER — RIVAROXABAN 20 MG PO TABS: ORAL_TABLET | ORAL | 3 refills | Status: DC

## 2021-12-20 MED ORDER — SPIRIVA RESPIMAT 2.5 MCG/ACT IN AERS: 2.0000 | INHALATION_SPRAY | Freq: Every day | RESPIRATORY_TRACT | 2 refills | Status: DC

## 2022-01-04 ENCOUNTER — Other Ambulatory Visit: Payer: Self-pay

## 2022-01-04 ENCOUNTER — Ambulatory Visit (INDEPENDENT_AMBULATORY_CARE_PROVIDER_SITE_OTHER): Payer: Medicare HMO | Admitting: Internal Medicine

## 2022-01-04 ENCOUNTER — Encounter: Payer: Self-pay | Admitting: Internal Medicine

## 2022-01-04 VITALS — BP 116/81 | HR 81 | Temp 97.6°F | Ht 64.0 in | Wt 172.8 lb

## 2022-01-04 DIAGNOSIS — J439 Emphysema, unspecified: Secondary | ICD-10-CM | POA: Diagnosis not present

## 2022-01-04 DIAGNOSIS — Z23 Encounter for immunization: Secondary | ICD-10-CM | POA: Diagnosis not present

## 2022-01-04 DIAGNOSIS — M810 Age-related osteoporosis without current pathological fracture: Secondary | ICD-10-CM | POA: Diagnosis not present

## 2022-01-04 DIAGNOSIS — I700 Atherosclerosis of aorta: Secondary | ICD-10-CM | POA: Diagnosis not present

## 2022-01-04 DIAGNOSIS — E559 Vitamin D deficiency, unspecified: Secondary | ICD-10-CM

## 2022-01-04 DIAGNOSIS — I10 Essential (primary) hypertension: Secondary | ICD-10-CM

## 2022-01-04 DIAGNOSIS — Z87891 Personal history of nicotine dependence: Secondary | ICD-10-CM | POA: Diagnosis not present

## 2022-01-04 MED ORDER — RIVAROXABAN 20 MG PO TABS: ORAL_TABLET | ORAL | 3 refills | Status: DC

## 2022-01-04 MED ORDER — SPIRIVA RESPIMAT 2.5 MCG/ACT IN AERS: 2.0000 | INHALATION_SPRAY | Freq: Every day | RESPIRATORY_TRACT | 2 refills | Status: DC

## 2022-01-04 MED ORDER — ALENDRONATE SODIUM 70 MG PO TABS: 70.0000 mg | ORAL_TABLET | ORAL | 2 refills | Status: DC

## 2022-01-04 MED ORDER — VITAMIN D3 25 MCG (1000 UNIT) PO TABS: 1000.0000 [IU] | ORAL_TABLET | Freq: Every day | ORAL | 3 refills | Status: DC

## 2022-01-04 MED ORDER — PRAVASTATIN SODIUM 10 MG PO TABS: 10.0000 mg | ORAL_TABLET | Freq: Every day | ORAL | 3 refills | Status: DC

## 2022-01-04 MED ORDER — BUDESONIDE-FORMOTEROL FUMARATE 80-4.5 MCG/ACT IN AERO: 2.0000 | INHALATION_SPRAY | Freq: Two times a day (BID) | RESPIRATORY_TRACT | 3 refills | Status: DC

## 2022-01-04 MED ORDER — LISINOPRIL 20 MG PO TABS: 20.0000 mg | ORAL_TABLET | Freq: Every day | ORAL | 3 refills | Status: DC

## 2022-01-04 NOTE — Assessment & Plan Note (Signed)
Pneumonia vaccine given today.  

## 2022-01-04 NOTE — Progress Notes (Signed)
Internal Medicine Clinic Attending ? ?Case discussed with Dr. Dean  At the time of the visit.  We reviewed the resident?s history and exam and pertinent patient test results.  I agree with the assessment, diagnosis, and plan of care documented in the resident?s note.  ?

## 2022-01-04 NOTE — Assessment & Plan Note (Signed)
BP above goal today at 143/81 with recheck of 116/81. Current regimen is lisinopril 20 mg daily which patient endorses adherence to. Plan:Continue current regimen.

## 2022-01-04 NOTE — Assessment & Plan Note (Addendum)
Patient had DEXA scan in 2020 which showed T-score of lumbar spine -2.3, left femoral neck -2.4, FRAX score of 15.0% of major osteoporotic fracture and 5.2% of hip fracture. She also suffered a fragility fracture to T10 late 2021. When last checked 01/2021 her vitamin D was low at 24.68. Current regimen is alendronate 70 mg once weekly and vitamin D3 1,000 units daily which she is tolerating well. Plan:Check vitamin D level today. Plan to repeat DEXA scan in 2025.

## 2022-01-04 NOTE — Assessment & Plan Note (Signed)
Last lipid panel 09/2021 with LDL just above goal at 101. Current regimen is pravastatin 10 mg daily. Plan:Recheck lipid panel at next OV. Continue current regimen.

## 2022-01-04 NOTE — Progress Notes (Signed)
   CC: routine check up  HPI:  [name] is a 71 y.o. person with past medical history as detailed below who presents today for routine check up. She is also requesting refills for her medications to be sent to a new pharmacy so they are ready for her to pick up in the new year. Please see problem based charting for detailed assessment and plan.  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  Physical Exam:  Vitals:   01/04/22 0933 01/04/22 0948  BP: (!) 143/81 116/81  Pulse: (!) 59 81  Temp: 97.6 F (36.4 C)   TempSrc: Oral   SpO2: 95%   Weight: 172 lb 12.8 oz (78.4 kg)   Height: '5\' 4"'$  (1.626 m)    Constitutional:Appears well, stated age. In no acute distress. Cardio:Regular rate and rhythm. Pulm:Clear to auscultation bilaterally. Normal work of breathing on room air. CBU:LAGTXMIW for extremity edema. Mild indentations from socks on bilateral ankles. Skin:Warm and dry. Neuro:Alert and oriented x3. No focal deficit noted. Psych:Pleasant mood and affect.  Assessment & Plan:   See Encounters Tab for problem based charting.  Aortic atherosclerosis (HCC) Last lipid panel 09/2021 with LDL just above goal at 101. Current regimen is pravastatin 10 mg daily. Plan:Recheck lipid panel at next OV. Continue current regimen.  Hypertension BP above goal today at 143/81 with recheck of 116/81. Current regimen is lisinopril 20 mg daily which  patient endorses adherence to. Plan:Continue current regimen.   Osteoporosis Patient had DEXA scan in 2020 which showed T-score of lumbar spine -2.3, left femoral neck -2.4, FRAX score of 15.0% of major osteoporotic fracture and 5.2% of hip fracture. She also suffered a fragility fracture to T10 late 2021. When last checked 01/2021 her vitamin D was low at 24.68. Current regimen is alendronate 70 mg once weekly and vitamin D3 1,000 units daily which she is tolerating well. Plan:Check vitamin D level today. Plan to repeat DEXA scan in 2025.  Need for pneumococcal vaccination Pneumonia vaccine given today.  Patient discussed with Dr.  Cain Sieve

## 2022-01-04 NOTE — Patient Instructions (Signed)
Ms. Mauney,  It was great seeing you today. I'm glad you are doing well!  I am not changing any of your medications at this time. I will let you know what your lab work shows.  Please plan to follow up in about 3 months for routine check up, or sooner if needed.  My best, Dr. Marlou Sa

## 2022-01-05 LAB — VITAMIN D 25 HYDROXY (VIT D DEFICIENCY, FRACTURES): Vit D, 25-Hydroxy: 33.8 ng/mL (ref 30.0–100.0)

## 2022-01-25 ENCOUNTER — Telehealth: Payer: Self-pay | Admitting: Internal Medicine

## 2022-01-25 ENCOUNTER — Encounter: Payer: Self-pay | Admitting: Internal Medicine

## 2022-01-25 NOTE — Telephone Encounter (Signed)
Error

## 2022-02-05 ENCOUNTER — Telehealth: Payer: Self-pay | Admitting: Oncology

## 2022-02-05 NOTE — Telephone Encounter (Signed)
Called patient per Dr. Alen Blew transition. Forwarded patient information to [facility] facility to be scheduled with Ned Card. Patient has been notified.

## 2022-03-28 ENCOUNTER — Ambulatory Visit (INDEPENDENT_AMBULATORY_CARE_PROVIDER_SITE_OTHER): Payer: Medicare HMO

## 2022-03-28 ENCOUNTER — Ambulatory Visit (INDEPENDENT_AMBULATORY_CARE_PROVIDER_SITE_OTHER): Payer: Medicare HMO | Admitting: Student

## 2022-03-28 ENCOUNTER — Encounter: Payer: Self-pay | Admitting: Student

## 2022-03-28 ENCOUNTER — Other Ambulatory Visit: Payer: Self-pay

## 2022-03-28 VITALS — BP 147/79 | HR 67 | Temp 98.0°F | Ht 64.0 in | Wt 173.3 lb

## 2022-03-28 VITALS — BP 148/80 | HR 67 | Temp 98.0°F | Ht 64.0 in | Wt 173.3 lb

## 2022-03-28 DIAGNOSIS — J019 Acute sinusitis, unspecified: Secondary | ICD-10-CM

## 2022-03-28 DIAGNOSIS — Z0000 Encounter for general adult medical examination without abnormal findings: Secondary | ICD-10-CM | POA: Diagnosis not present

## 2022-03-28 NOTE — Progress Notes (Signed)
Subjective:   Kimberly Walters is a 72 y.o. female who presents for Medicare Annual (Subsequent) preventive examination. I connected with  Kimberly Walters on 03/28/22 by a  Face-To-Face  enabled telemedicine application and verified that I am speaking with the correct person using two identifiers.  Patient Location: Other:  Office/Clinic  Provider Location: Office/Clinic  I discussed the limitations of evaluation and management by telemedicine. The patient expressed understanding and agreed to proceed.  Review of Systems    Defer to PCP       Objective:    Today's Vitals   03/28/22 1054  BP: (!) 148/80  Pulse: 67  Temp: 98 F (36.7 C)  TempSrc: Oral  SpO2: 94%  Weight: 173 lb 4.8 oz (78.6 kg)  Height: '5\' 4"'$  (1.626 m)   Body mass index is 29.75 kg/m.     03/28/2022   10:57 AM 03/28/2022   10:56 AM 03/28/2022    8:56 AM 01/04/2022    9:25 AM 10/18/2021   10:39 AM 08/22/2021   10:38 AM 08/02/2021    2:27 PM  Advanced Directives  Does Patient Have a Medical Advance Directive? No No No No Yes Yes No  Type of Advance Directive     Living will;Healthcare Power of Attorney Living will;Healthcare Power of Attorney   Does patient want to make changes to medical advance directive?     No - Patient declined    Copy of [place] in Chart?     No - copy requested No - copy requested   Would patient like information on creating a medical advance directive? No - Patient declined No - Patient declined No - Patient declined No - Patient declined No - Patient declined No - Patient declined No - Patient declined    Current Medications (verified) Outpatient Encounter Medications as of 03/28/2022  Medication Sig   acetaminophen (TYLENOL) 500 MG tablet Take 500 mg by mouth every 6 (six) hours as needed for moderate pain.   alendronate (FOSAMAX) 70 MG tablet Take 1 tablet (70 mg total) by mouth once a week. Take with a full glass of water on an empty stomach.   budesonide-formoterol  (SYMBICORT) 80-4.5 MCG/ACT inhaler Inhale 2 puffs into the lungs 2 (two) times daily.   cholecalciferol (VITAMIN D3) 25 MCG (1000 UNIT) tablet Take 1 tablet (1,000 Units total) by mouth daily.   lisinopril (ZESTRIL) 20 MG tablet Take 1 tablet (20 mg total) by mouth daily.   pravastatin (PRAVACHOL) 10 MG tablet Take 1 tablet (10 mg total) by mouth daily.   rivaroxaban (XARELTO) 20 MG TABS tablet TAKE 1 TABLET(20 MG) BY MOUTH DAILY WITH SUPPER   Tiotropium Bromide Monohydrate (SPIRIVA RESPIMAT) 2.5 MCG/ACT AERS Inhale 2 puffs into the lungs daily at 8 pm.   No facility-administered encounter medications on file as of 03/28/2022.    Allergies (verified) Patient has no known allergies.   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    Gross hematuria    Hypertension    Mitral valve prolapse    Paroxysmal atrial fibrillation with RVR (H[facility])    Pleural effusion on left    Post-thrombotic syndrome of left lower extremity    Primary hypercoagulable state ([place])    Pulmonary emboli (H[facility])    Pyelonephritis    Upper respiratory infection,  viral 04/02/2021   Past Surgical History:  Procedure Laterality Date   BIOPSY  07/19/2020   Procedure: BIOPSY;  Surgeon: Mauri Pole, MD;  Location: [facility];  Service: Endoscopy;;   COLONOSCOPY     ESOPHAGOGASTRODUODENOSCOPY (EGD) WITH PROPOFOL N/A 07/19/2020   Procedure: ESOPHAGOGASTRODUODENOSCOPY (EGD) WITH PROPOFOL;  Surgeon: Mauri Pole, MD;  Location: [facility];  Service: Endoscopy;  Laterality: N/A;   HYSTERECTOMY ABDOMINAL WITH SALPINGO-OOPHORECTOMY  1975   LYMPH NODE BIOPSY Right 1983   Neck   Family History  Problem Relation Age of Onset   Diabetes Mother    Heart disease Mother    Cancer Mother        Uterine   Diabetes Father    CAD Father    Heart  attack Father    Hypothyroidism Sister    Melanoma Sister    Clotting disorder Sister    Cancer Sister        stomach   Diabetes Sister    Diabetes Sister    Hypertension Sister    Hypothyroidism Sister    Diabetes Sister    Hypertension Sister    Hypothyroidism Sister    Cancer Sister    Cancer Sister    Diabetes Daughter    Diabetes Son    Diabetes Son    Pancreatic cancer Nephew    Uterine cancer Niece    Ovarian cancer Other    Uterine cancer Other    Breast cancer Other    Breast cancer Half-Sister    Colon cancer Neg Hx    Esophageal cancer Neg Hx    Social History   Socioeconomic History   Marital status: Widowed    Spouse name: Not on file   Number of children: 2   Years of education: Not on file   Highest education level: GED or equivalent  Occupational History   Occupation: Retired    Fish farm manager: [employer]  Tobacco Use   Smoking status: Former    Packs/day: 0.01    Types: Cigarettes    Quit date: 07/01/2019    Years since quitting: 2.7   Smokeless tobacco: Never   Tobacco comments:    1-2 per day Stopped 02/01/2021  Vaping Use   Vaping Use: Never used  Substance and Sexual Activity   Alcohol use: No   Drug use: No   Sexual activity: Yes    Birth control/protection: Surgical, Post-menopausal  Other Topics Concern   Not on file  Social History Narrative   Current Social History 09/12/2020      Patient lives with 2 sons and granddaughter in a one story home. There are 3 steps with handrail up to the entrance the patient uses.       Patient's method of transportation is personal car.      The highest level of education was GED      The patient currently retired from [employer].      Identified important Relationships are "my children, my grandchildren, my great-grandchildren, and my sisters"      Pets : dog and Futures trader / Fun: "read, watch movies, go walking, and just do things with the children."      Current Stressors: "dog"      Religious  / Personal Beliefs: "Old fashioned [place]."             Social Determinants of Health   Financial Resource Strain: Low Risk  (03/28/2022)   Overall Financial Resource  Strain (CARDIA)    Difficulty of Paying Living Expenses: Not very hard  Food Insecurity: No Food Insecurity (03/28/2022)   Hunger Vital Sign    Worried About Running Out of Food in the Last Year: Never true    Ran Out of Food in the Last Year: Never true  Transportation Needs: No Transportation Needs (03/28/2022)   PRAPARE - Hydrologist (Medical): No    Lack of Transportation (Non-Medical): No  Physical Activity: Insufficiently Active (03/28/2022)   Exercise Vital Sign    Days of Exercise per Week: 2 days    Minutes of Exercise per Session: 30 min  Stress: No Stress Concern Present (03/28/2022)   [place]    Feeling of Stress : Only a little  Social Connections: Socially Isolated (03/28/2022)   Social Connection and Isolation Panel [NHANES]    Frequency of Communication with Friends and Family: More than three times a week    Frequency of Social Gatherings with Friends and Family: More than three times a week    Attends Religious Services: Never    Marine scientist or Organizations: No    Attends Archivist Meetings: Never    Marital Status: Widowed    Tobacco Counseling Counseling given: Not Answered Tobacco comments: 1-2 per day Stopped 02/01/2021   Clinical Intake:  Pre-visit preparation completed: Yes  Pain : No/denies pain     Nutritional Risks: None Diabetes: No  How often do you need to have someone help you when you read instructions, pamphlets, or other written materials from your doctor or pharmacy?: 1 - Never What is the last grade level you completed in school?: 12th grade  Diabetic?No  Interpreter Needed?: No  Information entered by :: Vir Whetstine,cma   Activities of  Daily Living    03/28/2022   10:57 AM 03/28/2022    8:55 AM  In your present state of health, do you have any difficulty performing the following activities:  Hearing? 0 0  Vision? 0 0  Difficulty concentrating or making decisions? 0 0  Walking or climbing stairs? 1 1  Comment  Stairs  Dressing or bathing? 0 0  Doing errands, shopping? 0 0    Patient Care Team: Farrel Gordon, DO as PCP - General Alen Blew Mathis Dad, MD as Consulting Physician (Oncology) Loletha Carrow Kirke Corin, MD as Consulting Physician (Gastroenterology)  Indicate any recent Medical Services you may have received from other than [facility] providers in the past year (date may be approximate).     Assessment:   This is a routine wellness examination for Kimberly Walters.  Hearing/Vision screen No results found.  Dietary issues and exercise activities discussed:     Goals Addressed   None   Depression Screen    03/28/2022   10:56 AM 03/28/2022    9:39 AM 01/04/2022    9:26 AM 08/22/2021   10:37 AM 08/02/2021    2:27 PM 07/25/2021    1:21 PM 07/17/2021   10:09 AM  PHQ 2/9 Scores  PHQ - 2 Score 0 0 0 0 0 0 0  PHQ- 9 Score 3 3 0        Fall Risk    03/28/2022   10:56 AM 03/28/2022   10:06 AM 01/04/2022    9:25 AM 08/22/2021   10:37 AM 08/02/2021    2:26 PM  Fall Risk   Falls in the past year? 0 0 0 0 0  Number falls in past yr: 0 0  0 0  Injury with Fall? 0 0  0 0  Risk for fall due to : No Fall Risks No Fall Risks No Fall Risks No Fall Risks No Fall Risks  Follow up Falls evaluation completed;Falls prevention discussed Falls evaluation completed Falls evaluation completed Falls prevention discussed;Falls evaluation completed Falls evaluation completed;Falls prevention discussed    FALL RISK PREVENTION PERTAINING TO THE HOME:  Any stairs in or around the home?  Patient refusal : If so, are there any without handrails? Patient refusal Home free of loose throw rugs in walkways, pet beds, electrical cords, etc? Patient refusal Adequate  lighting in your home to reduce risk of falls? Patient refusal  ASSISTIVE DEVICES UTILIZED TO PREVENT FALLS:  Life alert? Patient refusal Use of a cane, walker or w/c? No  Grab bars in the bathroom? Patient refusal Shower chair or bench in shower? Patient refusal Elevated toilet seat or a handicapped toilet? Patient refusal  TIMED UP AND GO:  Was the test performed? Yes .  Length of time to ambulate 10 feet: 1 mim.   Gait slow and steady without use of assistive device  Cognitive Function:        03/28/2022   10:57 AM 09/12/2020    2:40 PM  6CIT Screen  What Year? 0 points 0 points  What month? 0 points 0 points  What time? 0 points 0 points  Count back from 20 0 points 0 points  Months in reverse 0 points 0 points  Repeat phrase 0 points 0 points  Total Score 0 points 0 points    Immunizations Immunization History  Administered Date(s) Administered   Influenza,inj,Quad PF,6+ Mos 11/23/2018   Influenza-Unspecified 10/23/2021   Moderna Covid-19 Vaccine Bivalent Booster 8yr & up 11/12/2021   PFIZER(Purple Top)SARS-COV-2 Vaccination 02/27/2019, 03/20/2019, 12/29/2019   PNEUMOCOCCAL CONJUGATE-20 01/04/2022   Pneumococcal Polysaccharide-23 07/15/2019   Respiratory Syncytial Virus Vaccine,Recomb Aduvanted(Arexvy) 10/23/2021   Tdap 08/02/2021    TDAP status: Up to date  Flu Vaccine status: Up to date  Pneumococcal vaccine status: Up to date  Covid-19 vaccine status: Completed vaccines  Qualifies for Shingles Vaccine? No   Zostavax completed No   Shingrix Completed?: No.    Education has been provided regarding the importance of this vaccine. Patient has been advised to call insurance company to determine out of pocket expense if they have not yet received this vaccine. Advised may also receive vaccine at local pharmacy or Health Dept. Verbalized acceptance and understanding.  Screening Tests Health Maintenance  Topic Date Due   Zoster Vaccines- Shingrix (1 of 2)  Never done   COVID-19 Vaccine (5 - 2023-24 season) 01/07/2022   Medicare Annual Wellness (AWV)  03/28/2023   MAMMOGRAM  05/23/2023   COLONOSCOPY (Pts 45-459yrInsurance coverage will need to be confirmed)  10/16/2025   DTaP/Tdap/Td (2 - Td or Tdap) 08/03/2031   Pneumonia Vaccine 6571Years old  Completed   INFLUENZA VACCINE  Completed   DEXA SCAN  Completed   Hepatitis C Screening  Completed   HPV VACCINES  Aged Out   COLON CANCER SCREENING ANNUAL FOBT  Discontinued    Health Maintenance  Health Maintenance Due  Topic Date Due   Zoster Vaccines- Shingrix (1 of 2) Never done   COVID-19 Vaccine (5 - 2023-24 season) 01/07/2022    Colorectal cancer screening: Type of screening: Colonoscopy. Completed 10/16/2020. Repeat every 5 years  Mammogram status: Completed 05/22/2021. Repeat every year:2  Bone  Density status: Completed 02/03/2018. Results reflect: Bone density results: OSTEOPENIA. Repeat every 0 years.  Lung Cancer Screening: (Low Dose CT Chest recommended if Age 89-80 years, 30 pack-year currently smoking OR have quit w/in 15years.) does not qualify.   Lung Cancer Screening Referral: N/A  Additional Screening:  Hepatitis C Screening: does not qualify; Completed 10/18/2021  Vision Screening: Recommended annual ophthalmology exams for early detection of glaucoma and other disorders of the eye. Is the patient up to date with their annual eye exam?  Patient refusal Who is the provider or what is the name of the office in which the patient attends annual eye exams? Patient refusal If pt is not established with a provider, would they like to be referred to a provider to establish care? Patient refusal.   Dental Screening: Recommended annual dental exams for proper oral hygiene  Community Resource Referral / Chronic Care Management: CRR required this visit?  No   CCM required this visit?  No      Plan:     I have personally reviewed and noted the following in the  patient's chart:   Medical and social history Use of alcohol, tobacco or illicit drugs  Current medications and supplements including opioid prescriptions. Patient is not currently taking opioid prescriptions. Functional ability and status Nutritional status Physical activity Advanced directives List of other physicians Hospitalizations, surgeries, and ER visits in previous 12 months Vitals Screenings to include cognitive, depression, and falls Referrals and appointments  In addition, I have reviewed and discussed with patient certain preventive protocols, quality metrics, and best practice recommendations. A written personalized care plan for preventive services as well as general preventive health recommendations were provided to patient.     Kerin Perna, [facility]   03/28/2022   Nurse Notes: Face-To-Face Visit  Ms. Dolce , Thank you for taking time to come for your Medicare Wellness Visit. I appreciate your ongoing commitment to your health goals. Please review the following plan we discussed and let me know if I can assist you in the future.   These are the goals we discussed:  Goals       Weight (lb) < 150 lb (68 kg) (pt-stated)        This is a list of the screening recommended for you and due dates:  Health Maintenance  Topic Date Due   Zoster (Shingles) Vaccine (1 of 2) Never done   COVID-19 Vaccine (5 - 2023-24 season) 01/07/2022   Medicare Annual Wellness Visit  03/28/2023   Mammogram  05/23/2023   Colon Cancer Screening  10/16/2025   DTaP/Tdap/Td vaccine (2 - Td or Tdap) 08/03/2031   Pneumonia Vaccine  Completed   Flu Shot  Completed   DEXA scan (bone density measurement)  Completed   Hepatitis C Screening: USPSTF Recommendation to screen - Ages 3-79 yo.  Completed   HPV Vaccine  Aged Out   Stool Blood Test  Discontinued

## 2022-03-28 NOTE — Patient Instructions (Signed)
Thank you so much for coming to the clinic today!   I think you have a sinus infection, and the best thing we can do for it as for you to use a netti pot. You can also pick up some flonase over the counter. The best time to use the flonase is right after using the netti pot.   If you have any questions please feel free to the call the clinic at anytime at [phone]. It was a pleasure seeing you!  Best, Dr. Sanjuana Mae

## 2022-03-29 DIAGNOSIS — J019 Acute sinusitis, unspecified: Secondary | ICD-10-CM | POA: Insufficient documentation

## 2022-03-29 NOTE — Progress Notes (Signed)
CC: Sinus congestion  HPI:  [name] is a 72 y.o. female living with a history stated below and presents today for sinus congestion. Please see problem based assessment and plan for additional details.  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     alendronate (FOSAMAX) 70 MG tablet Take 1 tablet (70 mg total) by mouth once a week. Take with a full glass of water on an empty stomach. 12 tablet 2   budesonide-formoterol (SYMBICORT) 80-4.5 MCG/ACT inhaler Inhale 2 puffs into the lungs 2 (two) times daily. 1 each 3   cholecalciferol (VITAMIN D3) 25 MCG (1000 UNIT) tablet Take 1 tablet (1,000 Units total) by mouth daily. 90 tablet 3   lisinopril (ZESTRIL) 20 MG tablet Take 1 tablet (20 mg total) by mouth daily. 90 tablet 3   pravastatin (PRAVACHOL) 10 MG tablet Take 1 tablet (10 mg total) by mouth daily. 90 tablet 3   rivaroxaban (XARELTO) 20 MG TABS tablet TAKE 1 TABLET(20 MG) BY MOUTH DAILY WITH SUPPER 30 tablet 3   Tiotropium Bromide Monohydrate (SPIRIVA RESPIMAT) 2.5 MCG/ACT AERS Inhale 2 puffs into the lungs daily at 8 pm. 4 g 2   No current facility-administered medications on file prior to visit.    Family History  Problem Relation Age of Onset   Diabetes Mother    Heart disease Mother     Cancer Mother        Uterine   Diabetes Father    CAD Father    Heart attack Father    Hypothyroidism Sister    Melanoma Sister    Clotting disorder Sister    Cancer Sister        stomach   Diabetes Sister    Diabetes Sister    Hypertension Sister    Hypothyroidism Sister    Diabetes Sister    Hypertension Sister    Hypothyroidism Sister    Cancer Sister    Cancer Sister    Diabetes Daughter    Diabetes Son    Diabetes Son    Pancreatic cancer Nephew    Uterine cancer Niece    Ovarian cancer Other    Uterine cancer Other    Breast cancer Other    Breast cancer Half-Sister    Colon cancer Neg Hx    Esophageal cancer Neg Hx     Social History   Socioeconomic History   Marital status: Widowed    Spouse name: Not on file   Number of children: 2   Years of education: Not on file   Highest education level: GED or equivalent  Occupational History   Occupation: Retired    Fish farm manager: [employer]  Tobacco Use   Smoking status: Former    Packs/day: 0.01  Types: Cigarettes    Quit date: 07/01/2019    Years since quitting: 2.7   Smokeless tobacco: Never   Tobacco comments:    1-2 per day Stopped 02/01/2021  Vaping Use   Vaping Use: Never used  Substance and Sexual Activity   Alcohol use: No   Drug use: No   Sexual activity: Yes    Birth control/protection: Surgical, Post-menopausal  Other Topics Concern   Not on file  Social History Narrative   Current Social History 09/12/2020      Patient lives with 2 sons and granddaughter in a one story home. There are 3 steps with handrail up to the entrance the patient uses.       Patient's method of transportation is personal car.      The highest level of education was GED      The patient currently retired from [employer].      Identified important Relationships are "my children, my grandchildren, my great-grandchildren, and my sisters"      Pets : dog and Futures trader / Fun: "read, watch movies, go walking, and just do  things with the children."      Current Stressors: "dog"      Religious / Personal Beliefs: "Old fashioned [place]."             Social Determinants of Health   Financial Resource Strain: Low Risk  (03/28/2022)   Overall Financial Resource Strain (CARDIA)    Difficulty of Paying Living Expenses: Not very hard  Food Insecurity: No Food Insecurity (03/28/2022)   Hunger Vital Sign    Worried About Running Out of Food in the Last Year: Never true    Ran Out of Food in the Last Year: Never true  Transportation Needs: No Transportation Needs (03/28/2022)   PRAPARE - Hydrologist (Medical): No    Lack of Transportation (Non-Medical): No  Physical Activity: Insufficiently Active (03/28/2022)   Exercise Vital Sign    Days of Exercise per Week: 2 days    Minutes of Exercise per Session: 30 min  Stress: No Stress Concern Present (03/28/2022)   [place]    Feeling of Stress : Only a little  Social Connections: Socially Isolated (03/28/2022)   Social Connection and Isolation Panel [NHANES]    Frequency of Communication with Friends and Family: More than three times a week    Frequency of Social Gatherings with Friends and Family: More than three times a week    Attends Religious Services: Never    Marine scientist or Organizations: No    Attends Archivist Meetings: Never    Marital Status: Widowed  Intimate Partner Violence: Not At Risk (03/28/2022)   Humiliation, Afraid, Rape, and Kick questionnaire    Fear of Current or Ex-Partner: No    Emotionally Abused: No    Physically Abused: No    Sexually Abused: No    Review of Systems: ROS negative except for what is noted on the assessment and plan.  Vitals:   03/28/22 0853 03/28/22 0923  BP: (!) 148/80 (!) 147/79  Pulse: 67 67  Temp: 98 F (36.7 C)   TempSrc: Oral   SpO2: 94%   Weight: 173 lb 4.8 oz (78.6 kg)   Height:  '5\' 4"'$  (1.626 m)     Physical Exam: Constitutional: well-appearing woman, occasional cough, in no acute distress HENT: normocephalic  atraumatic, mucous membranes moist, no erythema seen in oral cavity Eyes: conjunctiva non-erythematous Neck: supple, lymphadenopathy not present Cardiovascular: regular rate and rhythm, no m/r/g Pulmonary/Chest: normal work of breathing on room air, lungs clear to auscultation bilaterally   Assessment & Plan:   Acute rhinosinusitis Patient presents with a 6-day history of sinus congestion.  She has also been having clear discharge from her nose, and also productive sputum when she coughs.  She states that she is constantly blowing her nose, and as if there is pressure in her sinus area bilaterally.  She has tried Mucinex, which has not helped her.  She denies any sore throat, fevers, chills, diarrhea, dysuria.  She denies any sick contacts.  On my physical exam, sinuses are tender to palpation, when patient looks down there is increased pressure in her sinuses.  No erythema visualized in oral cavity, and no palpable lymph nodes.  I believe she may have acute viral rhinosinusitis.  She denies any systemic symptoms such as fevers, chills, and sore throat which makes upper respiratory tract infection less likely.  Recommended patient continue supportive care with Nettie pot for sinus irrigation, and Flonase.  Plan: - Nettie pot for irrigation - Flonase as needed  Patient discussed with Dr. Charissa Bash Kemper Hochman, M.D. [facility], PGY-1 Pager: [phone] Date 03/29/2022 Time 9:24 AM

## 2022-03-29 NOTE — Assessment & Plan Note (Addendum)
Patient presents with a 6-day history of sinus congestion.  She has also been having clear discharge from her nose, and also productive sputum when she coughs.  She states that she is constantly blowing her nose, and as if there is pressure in her sinus area bilaterally.  She has tried Mucinex, which has not helped her.  She denies any shortness of breath sore throat, fevers, chills, diarrhea, dysuria.  She denies any sick contacts.  On my physical exam, sinuses are tender to palpation, when patient looks down there is increased pressure in her sinuses.  No erythema visualized in oral cavity, and no palpable lymph nodes.  I believe she may have acute viral rhinosinusitis.  She denies any systemic symptoms such as fevers, chills, and sore throat which makes upper respiratory tract infection less likely.  Recommended patient continue supportive care with Nettie pot for sinus irrigation, and Flonase.  Given patient's history of COPD and other comorbidities may take longer than normal to completely resolve.  Plan: - Nettie pot for irrigation - Flonase as needed

## 2022-04-01 NOTE — Progress Notes (Signed)
Internal Medicine Clinic Attending  Case discussed with Dr. Nooruddin  At the time of the visit.  We reviewed the resident's history and exam and pertinent patient test results.  I agree with the assessment, diagnosis, and plan of care documented in the resident's note.  

## 2022-04-01 NOTE — Addendum Note (Signed)
Addended by: Lalla Brothers T on: 04/01/2022 08:26 AM   Modules accepted: Level of Service

## 2022-04-08 NOTE — Progress Notes (Signed)
I reviewed the AWV findings with the provider who conducted the visit. I was present in the office suite and immediately available to provide assistance and direction throughout the time the service was provided.  

## 2022-04-22 DIAGNOSIS — Z0100 Encounter for examination of eyes and vision without abnormal findings: Secondary | ICD-10-CM | POA: Diagnosis not present

## 2022-04-22 DIAGNOSIS — H5203 Hypermetropia, bilateral: Secondary | ICD-10-CM | POA: Diagnosis not present

## 2022-04-22 LAB — HM DIABETES EYE EXAM

## 2022-05-19 ENCOUNTER — Other Ambulatory Visit: Payer: Self-pay | Admitting: Internal Medicine

## 2022-06-17 ENCOUNTER — Other Ambulatory Visit: Payer: Self-pay | Admitting: Internal Medicine

## 2022-06-17 DIAGNOSIS — J439 Emphysema, unspecified: Secondary | ICD-10-CM

## 2022-06-18 ENCOUNTER — Other Ambulatory Visit: Payer: Self-pay | Admitting: Internal Medicine

## 2022-06-19 ENCOUNTER — Other Ambulatory Visit: Payer: Self-pay | Admitting: Internal Medicine

## 2022-06-27 IMAGING — MG MM DIGITAL SCREENING BILAT W/ TOMO AND CAD
8 series · 8 of 24 positions shown · non-contrast
Comparison: Previous exam(s).

CLINICAL DATA: Screening.

EXAM:
DIGITAL SCREENING BILATERAL MAMMOGRAM WITH TOMOSYNTHESIS AND CAD
TECHNIQUE: Bilateral screening digital craniocaudal and mediolateral oblique
mammograms were obtained. Bilateral screening digital breast
tomosynthesis was performed. The images were evaluated with
computer-aided detection.

[L CC synth-2D]
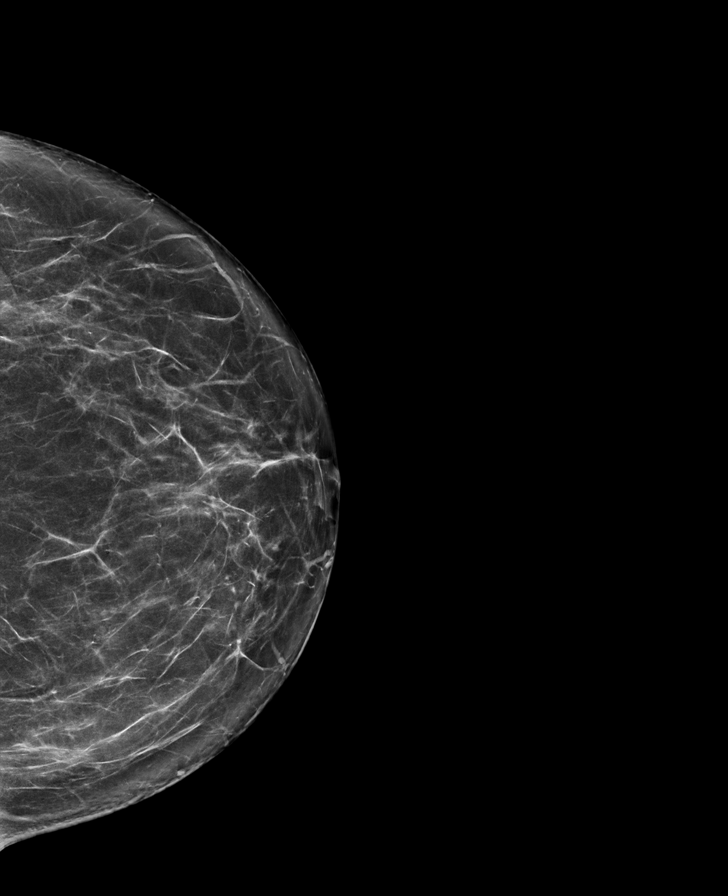

[R CC synth-2D]
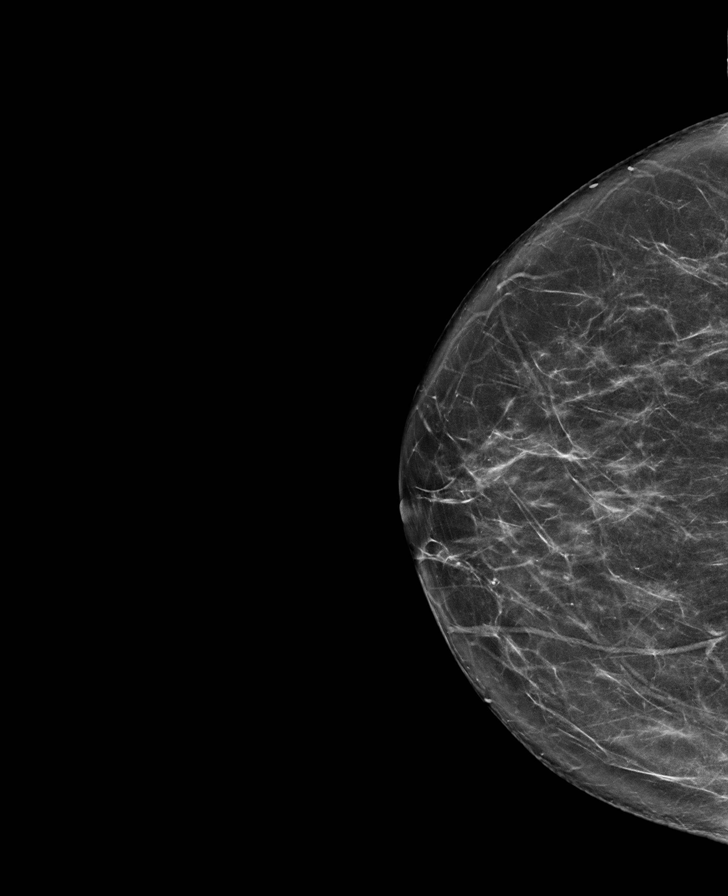

[R MLO synth-2D]
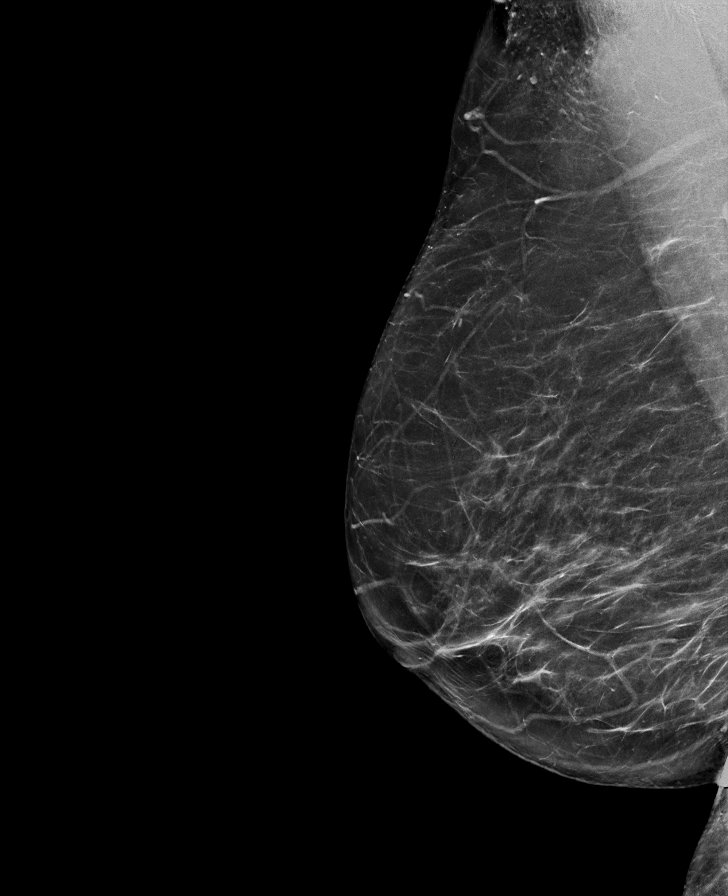

[L MLO synth-2D]
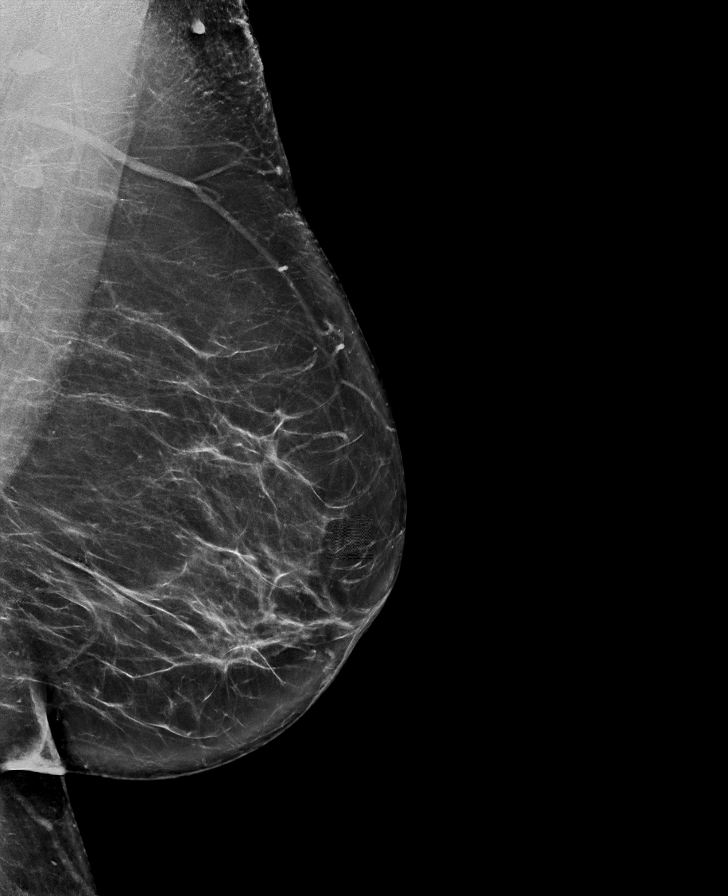

[R CC tomo · tomo slice 37/74.0]
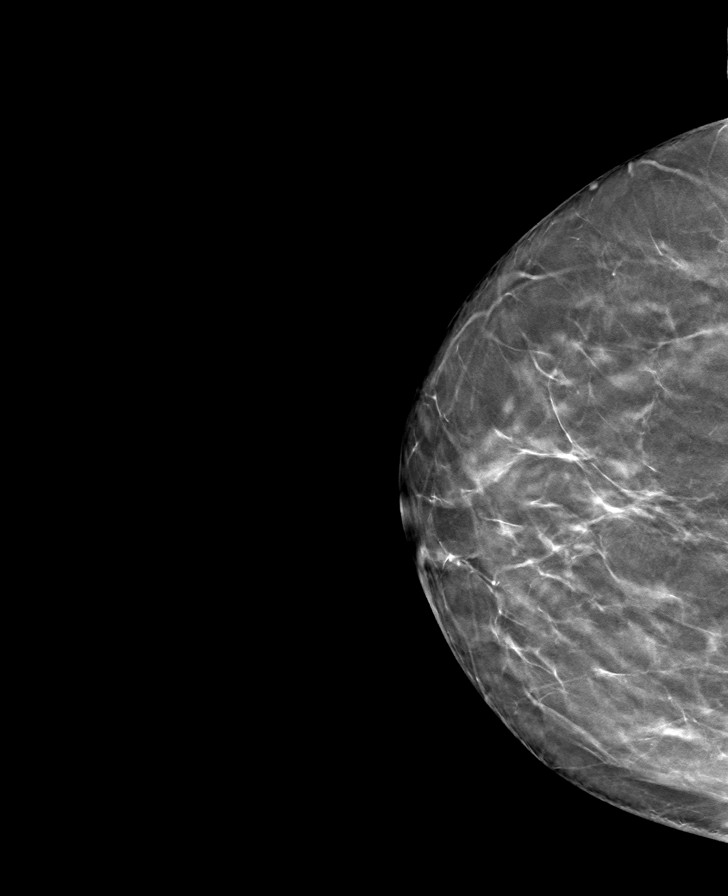

[L CC tomo · tomo slice 39/76.0]
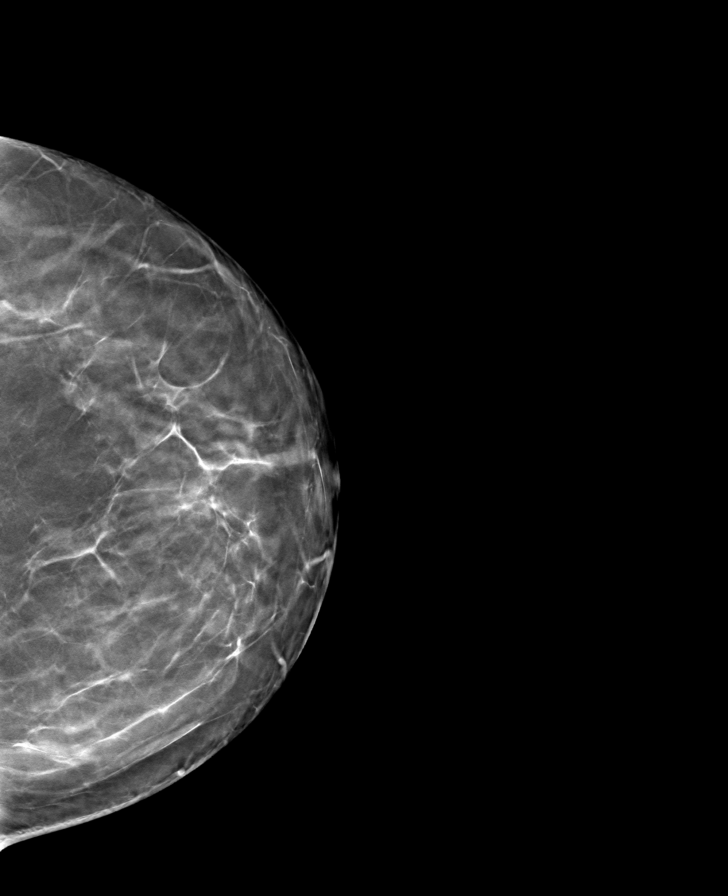

[R MLO tomo · tomo slice 39/78.0]
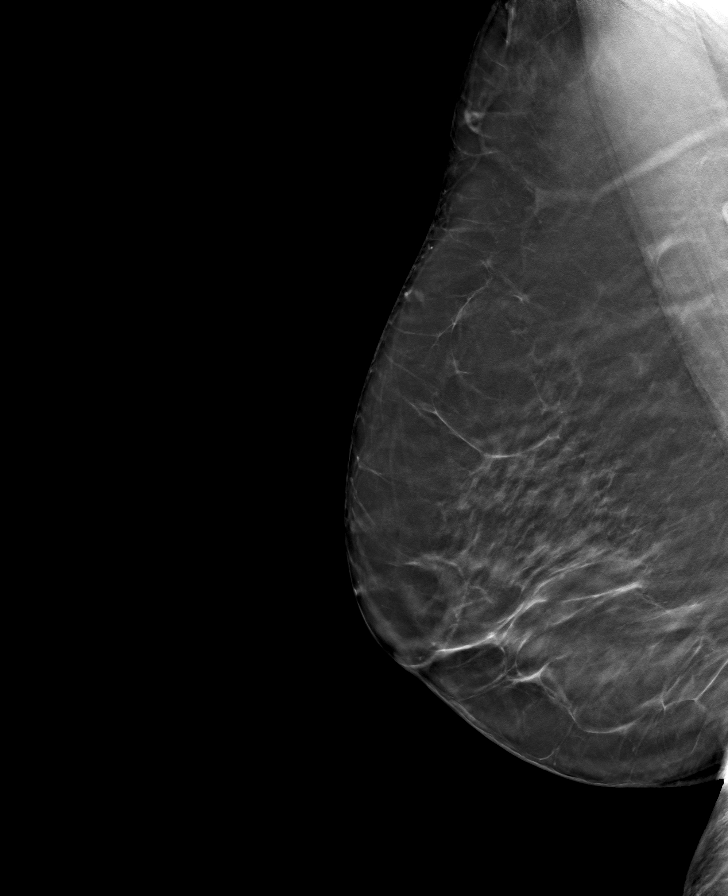

[L MLO tomo · tomo slice 44/87.0]
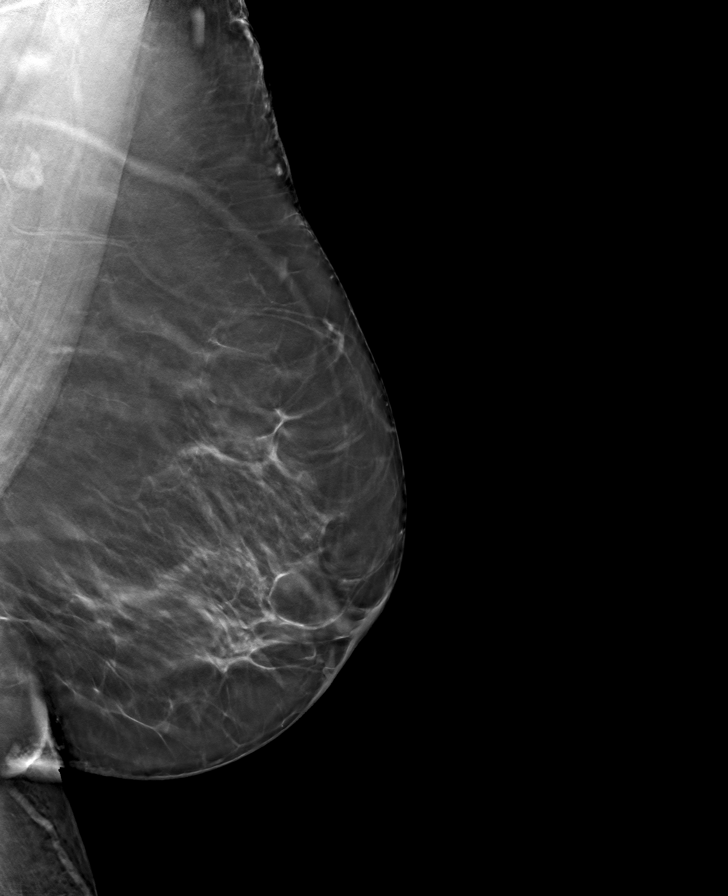

[8 of 24 positions shown; findings below may reference images not displayed]

ACR Breast Density Category b: There are scattered areas of
fibroglandular density.
FINDINGS: There are no findings suspicious for malignancy.
IMPRESSION: No mammographic evidence of malignancy. A result letter of this
screening mammogram will be mailed directly to the patient.

RECOMMENDATION:
Screening mammogram in one year. (Code:51-O-LD2)

BI-RADS CATEGORY  1: Negative.

## 2022-07-18 ENCOUNTER — Other Ambulatory Visit: Payer: Self-pay | Admitting: Internal Medicine

## 2022-07-23 NOTE — Progress Notes (Signed)
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 Clelia Croft due to history of DVT and PE.  She is heterozygous for the factor V Leiden mutation.  She had a pulmonary embolus in June 2021, felt to be unprovoked.  She was discharged home on Eliquis.  She presented back to the emergency room about 4 days later with shortness of breath, chest pain, hemoptysis.  CT chest showed redemonstration of acute bilateral pulmonary emboli; clot burden on the right appeared mildly decreased; there was more proximal extension of thrombus on the left.  She reported compliance with Eliquis.  She was started on IV heparin, discharged home on Lovenox.  She continued Lovenox until November 2021 when she was transitioned to Xarelto.  Lower extremity venous Doppler study 10/18/2019-chronic DVT involving the left popliteal vein, left posterior tibial veins and left peroneal veins.  Hypercoagulable panel 01/25/2020-heterozygous for factor V Leiden; positive lupus anticoagulant (she confirms she was on Xarelto at the time this blood work was completed).  Past medical history significant for COPD, hypertension, hypercholesterolemia, osteoporosis.  She reports having a sister with blood clots, antiphospholipid antibody syndrome.  She has smoked intermittently since age 52.  She has not smoked for the past 10 days.  No alcohol use.  She reports being up-to-date on cancer screenings.  She notes easy bruising.  Objective:  Vital signs in last 24 hours:  Blood pressure (!) 140/74, pulse 85, temperature 97.9 F (36.6 C), temperature source Oral, resp. rate 18, height 5\' 4"  (1.626 m), weight 175 lb 9.6 oz (79.7 kg), last menstrual period 06/21/1973, SpO2 96 %.    HEENT: No thrush or ulcers. Lymphatics: No palpable cervical, supraclavicular or axillary lymph  nodes. Resp: Distant breath sounds.  No respiratory distress. Cardio: Regular rate and rhythm. GI: No hepatosplenomegaly. Vascular: No leg edema.  Left lower leg appears slightly larger than the right lower leg. Neuro: Alert and oriented. Skin: A few ecchymoses on the upper extremities.   Lab Results:  Lab Results  Component Value Date   WBC 9.0 07/17/2021   HGB 15.3 07/17/2021   HCT 45.8 07/17/2021   MCV 91 07/17/2021   PLT 214 07/17/2021   NEUTROABS 6.2 07/17/2021    Imaging:  No results found.  Medications: I have reviewed the patient's current medications.  Assessment/Plan: Pulmonary embolus 07/13/2019 CT chest-extensive bilateral pulmonary arterial filling defects extending from the bilateral lower lobar pulmonary arteries into the segmental and subsegmental branches Heparin drip initiated, transition to Eliquis, discharged 07/16/2019 Presents to ED with chest pain/shortness of breath/hemoptysis 07/20/2019 CT chest 07/20/2019-acute bilateral pulmonary emboli, clot burden on the right mildly decreased, more proximal extension of thrombus on the left She reported compliance with Eliquis, started on IV heparin, discharged home on Lovenox 07/21/2019 Transitioned from Lovenox to Xarelto November 2021 Hypercoagulable panel 01/25/2020-heterozygous factor V Leiden mutation, lupus anticoagulant present  Left lower extremity DVT 10/18/2019-chronic DVT left popliteal, posterior tibial and peroneal veins Factor V Leiden heterozygous Presence of lupus anticoagulant on hypercoagulable panel 01/25/2020 while on Xarelto Hypertension Hypercholesterolemia COPD  Disposition: Kimberly Walters appears stable.  She had an unprovoked PE June 2021.  She is currently on Xarelto.  She will continue the same.  She would like to continue follow-up in our office on an annual basis.  She will return for office visit in 1 year.  Patient  seen with Dr. Truett Perna.  Lonna Cobb ANP/GNP-BC   07/24/2022  12:13  PM  This was a shared visit with Lonna Cobb.  Kimberly Walters was interviewed and examined.  She is maintained on indefinite anticoagulation therapy after suffering an unprovoked pulmonary embolism in 2021.  She appears to be tolerating the Xarelto well.  There is no clinical evidence of recurrent venous thrombosis.  We discussed reduced intensity anticoagulation.  She like to continue Xarelto at the current dose.  I was present for greater than 50% of today's visit.  I performed medical decision making.  Mancel Bale, MD

## 2022-07-24 ENCOUNTER — Inpatient Hospital Stay: Payer: Medicare HMO | Attending: Nurse Practitioner | Admitting: Nurse Practitioner

## 2022-07-24 ENCOUNTER — Encounter: Payer: Self-pay | Admitting: Nurse Practitioner

## 2022-07-24 ENCOUNTER — Ambulatory Visit: Payer: Medicare HMO | Admitting: Oncology

## 2022-07-24 VITALS — BP 140/74 | HR 85 | Temp 97.9°F | Resp 18 | Ht 64.0 in | Wt 175.6 lb

## 2022-07-24 DIAGNOSIS — E7800 Pure hypercholesterolemia, unspecified: Secondary | ICD-10-CM | POA: Diagnosis not present

## 2022-07-24 DIAGNOSIS — I2694 Multiple subsegmental pulmonary emboli without acute cor pulmonale: Secondary | ICD-10-CM

## 2022-07-24 DIAGNOSIS — J449 Chronic obstructive pulmonary disease, unspecified: Secondary | ICD-10-CM | POA: Diagnosis not present

## 2022-07-24 DIAGNOSIS — Z86711 Personal history of pulmonary embolism: Secondary | ICD-10-CM | POA: Diagnosis not present

## 2022-07-24 DIAGNOSIS — I10 Essential (primary) hypertension: Secondary | ICD-10-CM | POA: Diagnosis not present

## 2022-07-24 DIAGNOSIS — D6851 Activated protein C resistance: Secondary | ICD-10-CM | POA: Diagnosis not present

## 2022-07-24 DIAGNOSIS — Z7901 Long term (current) use of anticoagulants: Secondary | ICD-10-CM | POA: Diagnosis not present

## 2022-07-24 DIAGNOSIS — Z86718 Personal history of other venous thrombosis and embolism: Secondary | ICD-10-CM | POA: Diagnosis not present

## 2022-07-24 DIAGNOSIS — M810 Age-related osteoporosis without current pathological fracture: Secondary | ICD-10-CM | POA: Insufficient documentation

## 2022-07-31 ENCOUNTER — Encounter: Payer: Self-pay | Admitting: Student

## 2022-07-31 ENCOUNTER — Ambulatory Visit (INDEPENDENT_AMBULATORY_CARE_PROVIDER_SITE_OTHER): Payer: Medicare HMO | Admitting: Student

## 2022-07-31 ENCOUNTER — Other Ambulatory Visit: Payer: Self-pay | Admitting: Internal Medicine

## 2022-07-31 VITALS — BP 139/76 | HR 81 | Temp 98.0°F | Wt 176.5 lb

## 2022-07-31 DIAGNOSIS — H5789 Other specified disorders of eye and adnexa: Secondary | ICD-10-CM | POA: Diagnosis not present

## 2022-07-31 DIAGNOSIS — D6851 Activated protein C resistance: Secondary | ICD-10-CM | POA: Insufficient documentation

## 2022-07-31 DIAGNOSIS — I87002 Postthrombotic syndrome without complications of left lower extremity: Secondary | ICD-10-CM

## 2022-07-31 DIAGNOSIS — J439 Emphysema, unspecified: Secondary | ICD-10-CM

## 2022-07-31 NOTE — Patient Instructions (Signed)
Kimberly Walters,  Thank you for coming in today. This after visit summary is an important review of tests, referrals, and medication changes that were discussed during your visit. If you have questions or concerns, call the clinic at [phone] between 9am - 4pm. Outside of clinic business hours, call the main hospital at [phone] and ask the operator for the on-call internal medicine resident.  If you experience sudden worsening of your eye or your leg, please let us know.  Best, Dr. Katheran James

## 2022-07-31 NOTE — Assessment & Plan Note (Signed)
Awoke Saturday (5 days ago) with R eyelid swelling, crusting, clear drainage. Home treated with warm compress and it has improved significantly. Denies erythema, photophobia, pain with movement, purulent drainage, sick contacts. Assessment - unrevealing exam today and improving rapidly. Patient feels that it is much better and "almost didn't come in." At this point I cannot say for sure what the cause was - perhaps viral/allergic conjunctivitis or hordeolum. I do not suspect bacterial infection. Her home treatment has worked well, reassurance provided, return precautions given.

## 2022-07-31 NOTE — Progress Notes (Signed)
CC: R eye swelling, L leg swelling  HPI:  [name] is a 72 y.o. female living with a history stated below and presents today for evaluation of her R eye, which was swollen over the weekend and is improving, and evaluation of her L leg, which was noted to be swollen at a doctors appointment last week with recommended follow up. Please see problem based assessment and plan for additional details.  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     alendronate (FOSAMAX) 70 MG tablet Take 1 tablet (70 mg total) by mouth once a week. Take with a full glass of water on an empty stomach. 12 tablet 2   budesonide-formoterol (SYMBICORT) 80-4.5 MCG/ACT inhaler Inhale 2 puffs into the lungs 2 (two) times daily. (Patient taking differently: Inhale 2 puffs into the lungs every morning.) 1 each 3   cholecalciferol (VITAMIN D3) 25 MCG (1000 UNIT) tablet Take 1 tablet (1,000 Units total) by mouth daily. 90 tablet 3   lisinopril (ZESTRIL) 20 MG tablet Take 1 tablet (20 mg total) by mouth daily. 90 tablet 3   pravastatin (PRAVACHOL) 10 MG tablet Take 1 tablet (10 mg total) by mouth daily. 90 tablet 3   XARELTO 20 MG TABS tablet TAKE 1 TABLET BY MOUTH ONCE DAILY WITH SUPPER 30 tablet 0   No current facility-administered  medications on file prior to visit.    Family History  Problem Relation Age of Onset   Diabetes Mother    Heart disease Mother    Cancer Mother        Uterine   Diabetes Father    CAD Father    Heart attack Father    Hypothyroidism Sister    Melanoma Sister    Clotting disorder Sister    Cancer Sister        stomach   Diabetes Sister    Diabetes Sister    Hypertension Sister    Hypothyroidism Sister    Diabetes Sister    Hypertension Sister    Hypothyroidism Sister    Cancer Sister    Cancer Sister    Diabetes Daughter    Diabetes Son    Diabetes Son    Pancreatic cancer Nephew    Uterine cancer Niece    Ovarian cancer Other    Uterine cancer Other    Breast cancer Other    Breast cancer Half-Sister    Colon cancer Neg Hx    Esophageal cancer Neg Hx     Social History   Socioeconomic History   Marital status: Widowed    Spouse name: Not on file   Number of children: 2   Years of education: Not on file   Highest education level: GED or equivalent  Occupational History   Occupation: Retired    Associate Professor: [employer]  Tobacco Use   Smoking status: Former    Packs/day: .01    Types: Cigarettes    Quit date: 07/01/2019    Years since quitting: 3.0   Smokeless tobacco: Never   Tobacco comments:    1-2 per day Stopped 02/01/2021  Vaping Use   Vaping Use: Never used  Substance and Sexual Activity   Alcohol use: No   Drug use: No   Sexual activity: Yes    Birth control/protection: Surgical, Post-menopausal  Other Topics Concern   Not on file  Social History Narrative   Current Social History 09/12/2020      Patient lives with 2 sons and granddaughter in a one story home. There are 3 steps with handrail up to the entrance the patient uses.       Patient's method of transportation is personal car.      The highest level of education was GED      The patient currently retired from [employer].      Identified important Relationships are "my children, my grandchildren,  my great-grandchildren, and my sisters"      Pets : dog and Brewing technologist / Fun: "read, watch movies, go walking, and just do things with the children."      Current Stressors: "dog"      Religious / Personal Beliefs: "Old fashioned [employer]."             Social Determinants of Health   Financial Resource Strain: Low Risk  (03/28/2022)   Overall Financial Resource Strain (CARDIA)    Difficulty of Paying Living Expenses: Not very hard  Food Insecurity: No Food Insecurity (03/28/2022)   Hunger Vital Sign    Worried About Running Out of Food in the Last Year: Never true    Ran Out of Food in the Last Year: Never true  Transportation Needs: No Transportation Needs (03/28/2022)   PRAPARE - Administrator, Civil Service (Medical): No    Lack of Transportation (Non-Medical): No  Physical Activity: Insufficiently Active (03/28/2022)   Exercise Vital Sign    Days of Exercise per Week: 2 days    Minutes of Exercise per Session: 30 min  Stress: No Stress Concern Present (03/28/2022)   [employer] of Occupational Health - Occupational Stress Questionnaire    Feeling of Stress : Only a little  Social Connections: Socially Isolated (03/28/2022)   Social Connection and Isolation Panel [NHANES]    Frequency of Communication with Friends and Family: More than three times a week    Frequency of Social Gatherings with Friends and Family: More than three times a week    Attends Religious Services: Never    Database administrator or Organizations: No    Attends Banker Meetings: Never    Marital Status: Widowed  Intimate Partner Violence: Not At Risk (03/28/2022)   Humiliation, Afraid, Rape, and Kick questionnaire    Fear of Current or Ex-Partner: No    Emotionally Abused: No    Physically Abused: No    Sexually Abused: No    Review of Systems: ROS negative except for what is noted on the assessment and plan.  Vitals:   07/31/22 1525 07/31/22 1606  BP:  (!) 140/81 139/76  Pulse: 82 81  Temp: 98 F (36.7 C)   TempSrc: Oral   SpO2: 94%   Weight: 176 lb 8 oz (  80.1 kg)     Physical Exam: Constitutional: well-appearing female sitting in chair, in no acute distress HENT: normocephalic atraumatic, mucous membranes moist, eye exam WNL without erythema, swelling, drainage, EOM intact Eyes: conjunctiva non-erythematous Cardiovascular: regular rate and rhythm, no m/r/g Pulmonary/Chest: normal work of breathing on room air, lungs clear to auscultation bilaterally Abdominal: soft, non-tender, non-distended MSK: normal bulk and tone Neurological: alert & oriented x 3, no focal deficit Skin: warm and dry Psych: normal mood and behavior  Assessment & Plan:    Patient seen with Dr. Sol Blazing  Eye swelling Awoke Saturday (5 days ago) with R eyelid swelling, crusting, clear drainage. Home treated with warm compress and it has improved significantly. Denies erythema, photophobia, pain with movement, purulent drainage, sick contacts. Assessment - unrevealing exam today and improving rapidly. Patient feels that it is much better and "almost didn't come in." At this point I cannot say for sure what the cause was - perhaps viral/allergic conjunctivitis or hordeolum. I do not suspect bacterial infection. Her home treatment has worked well, reassurance provided, return precautions given.  Post-thrombotic syndrome of left lower extremity Patient with history of Factor V, previous DVT in L leg, and PE, who is now on Xarelto. At hematologist last week, her L leg was noted to be slightly larger than R. This is the leg that harbored a DVT in 2021. On my exam, I agree that the leg appears slightly larger than the R. However, she does not have pain or erythema and is Hoffman negative. She has not been immobile and remains on her xarelto. She does not feel the swelling is outside of her ordinary. On chart review, it has been observed previously that her L leg tends to swell  more than the R, sequela of her DVT. Finally, Wells criteria for this patient was calculated at -1. I feel comfortable that she does not currently have a DVT. No intervention indicated today. She is very aware of the importance of her anticoagulant and we discussed return precautions.  Katheran James, D.O. [facility] Internal Medicine, PGY-1 Phone: [phone] Date 07/31/2022 Time 4:38 PM

## 2022-07-31 NOTE — Assessment & Plan Note (Signed)
Patient with history of Factor V, previous DVT in L leg, and PE, who is now on Xarelto. At hematologist last week, her L leg was noted to be slightly larger than R. This is the leg that harbored a DVT in 2021. On my exam, I agree that the leg appears slightly larger than the R. However, she does not have pain or erythema and is Hoffman negative. She has not been immobile and remains on her xarelto. She does not feel the swelling is outside of her ordinary. On chart review, it has been observed previously that her L leg tends to swell more than the R, sequela of her DVT. Finally, Wells criteria for this patient was calculated at -1. I feel comfortable that she does not currently have a DVT. No intervention indicated today. She is very aware of the importance of her anticoagulant and we discussed return precautions.

## 2022-08-01 NOTE — Progress Notes (Signed)
Internal Medicine Clinic Attending  I was physically present during the key portions of the resident provided service and participated in the medical decision making of patient's management care. I reviewed pertinent patient test results.  The assessment, diagnosis, and plan were formulated together and I agree with the documentation in the resident's note. Low suspicion for acute DVT of the left leg. Return precautions provided.  Dickie La, MD

## 2022-08-16 ENCOUNTER — Other Ambulatory Visit: Payer: Self-pay | Admitting: Internal Medicine

## 2022-08-19 ENCOUNTER — Other Ambulatory Visit: Payer: Self-pay | Admitting: Internal Medicine

## 2022-08-20 NOTE — Telephone Encounter (Signed)
Refilled yesterday. 

## 2022-08-26 ENCOUNTER — Other Ambulatory Visit: Payer: Self-pay | Admitting: Internal Medicine

## 2022-08-26 DIAGNOSIS — J439 Emphysema, unspecified: Secondary | ICD-10-CM

## 2022-09-16 ENCOUNTER — Other Ambulatory Visit: Payer: Self-pay | Admitting: Internal Medicine

## 2022-10-23 ENCOUNTER — Other Ambulatory Visit: Payer: Self-pay | Admitting: Internal Medicine

## 2022-11-18 ENCOUNTER — Other Ambulatory Visit: Payer: Self-pay

## 2022-11-18 DIAGNOSIS — E559 Vitamin D deficiency, unspecified: Secondary | ICD-10-CM

## 2022-11-19 NOTE — Telephone Encounter (Signed)
Pt was called and she stated she is transferring her care to [facility].

## 2022-11-20 MED ORDER — VITAMIN D3 25 MCG (1000 UNIT) PO TABS
1000.0000 [IU] | ORAL_TABLET | Freq: Every day | ORAL | 0 refills | Status: DC
Start: 2022-11-20 — End: 2023-02-17

## 2022-12-14 DIAGNOSIS — Z20822 Contact with and (suspected) exposure to covid-19: Secondary | ICD-10-CM | POA: Diagnosis not present

## 2022-12-14 DIAGNOSIS — R059 Cough, unspecified: Secondary | ICD-10-CM | POA: Diagnosis not present

## 2022-12-15 ENCOUNTER — Other Ambulatory Visit: Payer: Self-pay | Admitting: Internal Medicine

## 2022-12-15 DIAGNOSIS — J439 Emphysema, unspecified: Secondary | ICD-10-CM

## 2022-12-24 ENCOUNTER — Encounter: Payer: Self-pay | Admitting: Family Medicine

## 2022-12-24 ENCOUNTER — Other Ambulatory Visit: Payer: Self-pay | Admitting: Internal Medicine

## 2022-12-24 ENCOUNTER — Ambulatory Visit (INDEPENDENT_AMBULATORY_CARE_PROVIDER_SITE_OTHER): Payer: Medicare HMO

## 2022-12-24 ENCOUNTER — Ambulatory Visit: Payer: Medicare HMO | Admitting: Family Medicine

## 2022-12-24 VITALS — BP 134/86 | HR 85 | Temp 97.5°F | Ht 64.0 in | Wt 170.0 lb

## 2022-12-24 DIAGNOSIS — J439 Emphysema, unspecified: Secondary | ICD-10-CM

## 2022-12-24 DIAGNOSIS — R0609 Other forms of dyspnea: Secondary | ICD-10-CM | POA: Diagnosis not present

## 2022-12-24 DIAGNOSIS — R918 Other nonspecific abnormal finding of lung field: Secondary | ICD-10-CM | POA: Diagnosis not present

## 2022-12-24 DIAGNOSIS — I700 Atherosclerosis of aorta: Secondary | ICD-10-CM | POA: Diagnosis not present

## 2022-12-24 DIAGNOSIS — R002 Palpitations: Secondary | ICD-10-CM

## 2022-12-24 DIAGNOSIS — R7303 Prediabetes: Secondary | ICD-10-CM | POA: Diagnosis not present

## 2022-12-24 DIAGNOSIS — E559 Vitamin D deficiency, unspecified: Secondary | ICD-10-CM | POA: Diagnosis not present

## 2022-12-24 DIAGNOSIS — J449 Chronic obstructive pulmonary disease, unspecified: Secondary | ICD-10-CM | POA: Diagnosis not present

## 2022-12-24 DIAGNOSIS — M810 Age-related osteoporosis without current pathological fracture: Secondary | ICD-10-CM

## 2022-12-24 DIAGNOSIS — R1011 Right upper quadrant pain: Secondary | ICD-10-CM

## 2022-12-24 DIAGNOSIS — D6851 Activated protein C resistance: Secondary | ICD-10-CM

## 2022-12-24 DIAGNOSIS — I10 Essential (primary) hypertension: Secondary | ICD-10-CM

## 2022-12-24 DIAGNOSIS — R0902 Hypoxemia: Secondary | ICD-10-CM | POA: Diagnosis not present

## 2022-12-24 DIAGNOSIS — R198 Other specified symptoms and signs involving the digestive system and abdomen: Secondary | ICD-10-CM | POA: Insufficient documentation

## 2022-12-24 DIAGNOSIS — R10811 Right upper quadrant abdominal tenderness: Secondary | ICD-10-CM | POA: Insufficient documentation

## 2022-12-24 DIAGNOSIS — Z86711 Personal history of pulmonary embolism: Secondary | ICD-10-CM | POA: Diagnosis not present

## 2022-12-24 DIAGNOSIS — F17200 Nicotine dependence, unspecified, uncomplicated: Secondary | ICD-10-CM

## 2022-12-24 LAB — COMPREHENSIVE METABOLIC PANEL
ALT: 16 U/L (ref 0–35)
AST: 16 U/L (ref 0–37)
Albumin: 4.2 g/dL (ref 3.5–5.2)
Alkaline Phosphatase: 65 U/L (ref 39–117)
BUN: 19 mg/dL (ref 6–23)
CO2: 30 meq/L (ref 19–32)
Calcium: 10 mg/dL (ref 8.4–10.5)
Chloride: 103 meq/L (ref 96–112)
Creatinine, Ser: 0.97 mg/dL (ref 0.40–1.20)
GFR: 58.14 mL/min — ABNORMAL LOW (ref >60.00)
Glucose, Bld: 97 mg/dL (ref 70–99)
Potassium: 4.2 meq/L (ref 3.5–5.1)
Sodium: 141 meq/L (ref 135–145)
Total Bilirubin: 0.7 mg/dL (ref 0.2–1.2)
Total Protein: 7.3 g/dL (ref 6.0–8.3)

## 2022-12-24 LAB — CBC WITH DIFFERENTIAL/PLATELET
Basophils Absolute: 0.1 10*3/uL (ref 0.0–0.1)
Basophils Relative: 0.7 % (ref 0.0–3.0)
Eosinophils Absolute: 0.1 10*3/uL (ref 0.0–0.7)
Eosinophils Relative: 1.5 % (ref 0.0–5.0)
HCT: 45.8 % (ref 36.0–46.0)
Hemoglobin: 15.2 g/dL — ABNORMAL HIGH (ref 12.0–15.0)
Lymphocytes Relative: 18.9 % (ref 12.0–46.0)
Lymphs Abs: 1.6 10*3/uL (ref 0.7–4.0)
MCHC: 33.3 g/dL (ref 30.0–36.0)
MCV: 95.9 fL (ref 78.0–100.0)
Monocytes Absolute: 0.6 10*3/uL (ref 0.1–1.0)
Monocytes Relative: 7.3 % (ref 3.0–12.0)
Neutro Abs: 6.2 10*3/uL (ref 1.4–7.7)
Neutrophils Relative %: 71.6 % (ref 43.0–77.0)
Platelets: 271 10*3/uL (ref 150.0–400.0)
RBC: 4.77 Mil/uL (ref 3.87–5.11)
RDW: 13.3 % (ref 11.5–15.5)
WBC: 8.7 10*3/uL (ref 4.0–10.5)

## 2022-12-24 LAB — VITAMIN D 25 HYDROXY (VIT D DEFICIENCY, FRACTURES): VITD: 31.64 ng/mL (ref 30.00–100.00)

## 2022-12-24 LAB — FERRITIN: Ferritin: 118.3 ng/mL (ref 10.0–291.0)

## 2022-12-24 LAB — TSH: TSH: 0.45 u[IU]/mL (ref 0.35–5.50)

## 2022-12-24 LAB — T4, FREE: Free T4: 1.04 ng/dL (ref 0.60–1.60)

## 2022-12-24 LAB — BRAIN NATRIURETIC PEPTIDE: Pro B Natriuretic peptide (BNP): 62 pg/mL (ref 0.0–100.0)

## 2022-12-24 LAB — D-DIMER, QUANTITATIVE: D-Dimer, Quant: 0.66 ug{FEU}/mL — ABNORMAL HIGH (ref <0.50)

## 2022-12-24 LAB — LIPASE: Lipase: 33 U/L (ref 11.0–59.0)

## 2022-12-24 LAB — MAGNESIUM: Magnesium: 1.9 mg/dL (ref 1.5–2.5)

## 2022-12-24 LAB — HEMOGLOBIN A1C: Hgb A1c MFr Bld: 7.3 % — ABNORMAL HIGH (ref 4.6–6.5)

## 2022-12-24 LAB — TROPONIN I (HIGH SENSITIVITY): High Sens Troponin I: 17 ng/L (ref 2–17)

## 2022-12-24 MED ORDER — ALBUTEROL SULFATE HFA 108 (90 BASE) MCG/ACT IN AERS
2.0000 | INHALATION_SPRAY | Freq: Four times a day (QID) | RESPIRATORY_TRACT | 0 refills | Status: DC | PRN
Start: 2022-12-24 — End: 2023-06-24

## 2022-12-24 NOTE — Assessment & Plan Note (Signed)
No acute distress but visibly uncomfortable. Possible early cholecystitis. Lipase and RUQ [place] ordered.

## 2022-12-24 NOTE — Assessment & Plan Note (Signed)
Encourage smoking cessation

## 2022-12-24 NOTE — Assessment & Plan Note (Signed)
On Xarelto. Sees hematology

## 2022-12-24 NOTE — Assessment & Plan Note (Signed)
Possible cholecystitis. Lipase and RUQ [place] ordered.

## 2022-12-24 NOTE — Assessment & Plan Note (Signed)
Taking alendronate. Check vitamin D level, hx of def.  Hx of fracture. Consider more aggressive treatment at follow up visit and rechecking DEXA

## 2022-12-24 NOTE — Addendum Note (Signed)
Addended by: Avanell Shackleton on: 12/24/2022 12:24 PM   Modules accepted: Orders

## 2022-12-24 NOTE — Assessment & Plan Note (Signed)
Continue statin therapy. Check lipids at follow up visit

## 2022-12-24 NOTE — Assessment & Plan Note (Signed)
Check A1c. 

## 2022-12-24 NOTE — Assessment & Plan Note (Signed)
EKG shows NSR, rate 75, no acute changes compared to old EKG Mod-high risk - check labs including thyroid. Referral to cardiology for DOE and palpitations. DOE is more likely r/t COPD

## 2022-12-24 NOTE — Assessment & Plan Note (Addendum)
Not quite to goal. Continue current medication and monitor BP at home. Encouraged smoking cessation. Check renal function.

## 2022-12-24 NOTE — Progress Notes (Addendum)
New Patient Office Visit  Subjective    Patient ID: Kimberly Walters, female    DOB: 21-Apr-1950  Age: 72 y.o. MRN: [id number]  CC:  Chief Complaint  Patient presents with   Establish Care    Wants to discuss SOB with exertion but oxygen level when checks it is okay    HPI KINDLE CONEY presents to establish care  C/o DOE x 1 month that improves with rest.   Hx of A-fib, PE, and LLE DVT. On Xarelto.   C/o upper abdominal pain after eating for 3 months. Nausea. ?heartburn.  No vomiting.  No unexplained weight loss.   Reports being diagnosed with COPD and using spiriva and symbicort. States she wakes up with oxygen in the 80 percent range and has been as low as 80%. States she wakes up with her heart racing.   States she completed a course of azithromycin last week which helped with cough but not breathing.   On alendronate for osteoporosis.  DEXA scan in 2020 which showed T-score of lumbar spine -2.3, left femoral neck -2.4, FRAX score of 15.0% of major osteoporotic fracture and 5.2% of hip fracture. She also suffered a fragility fracture to T10 late 2021.     Outpatient Encounter Medications as of 12/24/2022  Medication Sig   acetaminophen (TYLENOL) 500 MG tablet Take 500 mg by mouth every 6 (six) hours as needed for moderate pain.   albuterol (VENTOLIN HFA) 108 (90 Base) MCG/ACT inhaler Inhale 2 puffs into the lungs every 6 (six) hours as needed for wheezing or shortness of breath.   alendronate (FOSAMAX) 70 MG tablet Take 1 tablet (70 mg total) by mouth once a week. Take with a full glass of water on an empty stomach.   cholecalciferol (VITAMIN D3) 25 MCG (1000 UNIT) tablet Take 1 tablet (1,000 Units total) by mouth daily.   lisinopril (ZESTRIL) 20 MG tablet Take 1 tablet (20 mg total) by mouth daily.   pravastatin (PRAVACHOL) 10 MG tablet Take 1 tablet (10 mg total) by mouth daily.   rivaroxaban (XARELTO) 20 MG TABS tablet TAKE 1 TABLET BY MOUTH ONCE DAILY WITH SUPPER    SPIRIVA RESPIMAT 2.5 MCG/ACT AERS INHALE 2 SPRAY(S) BY MOUTH ONCE DAILY AT  8  PM   SYMBICORT 80-4.5 MCG/ACT inhaler Inhale 2 puffs by mouth twice daily   No facility-administered encounter medications on file as of 12/24/2022.    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    Gross hematuria    Hypertension    Mitral valve prolapse    Paroxysmal atrial fibrillation with RVR (HCC)    Pleural effusion on left    Post-thrombotic syndrome of left lower extremity    Primary hypercoagulable state (HCC)    Pulmonary emboli (HCC)    Pyelonephritis    Upper respiratory infection, viral 04/02/2021    Past Surgical History:  Procedure Laterality Date   BIOPSY  07/19/2020   Procedure: BIOPSY;  Surgeon: Napoleon Form, MD;  Location: [facility];  Service: Endoscopy;;   COLONOSCOPY     ESOPHAGOGASTRODUODENOSCOPY (EGD) WITH PROPOFOL N/A 07/19/2020   Procedure: ESOPHAGOGASTRODUODENOSCOPY (EGD) WITH PROPOFOL;  Surgeon: Napoleon Form, MD;  Location: [facility];  Service: Endoscopy;  Laterality: N/A;   HYSTERECTOMY ABDOMINAL WITH SALPINGO-OOPHORECTOMY  1975   LYMPH NODE BIOPSY Right 1983  Neck    Family History  Problem Relation Age of Onset   Diabetes Mother    Heart disease Mother    Cancer Mother        Uterine   Diabetes Father    CAD Father    Heart attack Father    Hypothyroidism Sister    Melanoma Sister    Clotting disorder Sister    Cancer Sister        stomach   Diabetes Sister    Diabetes Sister    Hypertension Sister    Hypothyroidism Sister    Diabetes Sister    Hypertension Sister    Hypothyroidism Sister    Cancer Sister    Cancer Sister    Diabetes Daughter    Diabetes Son    Diabetes Son    Pancreatic cancer Nephew    Uterine cancer Niece    Ovarian cancer Other    Uterine cancer  Other    Breast cancer Other    Breast cancer Half-Sister    Colon cancer Neg Hx    Esophageal cancer Neg Hx     Social History   Socioeconomic History   Marital status: Widowed    Spouse name: Not on file   Number of children: 2   Years of education: Not on file   Highest education level: GED or equivalent  Occupational History   Occupation: Retired    Associate Professor: [employer]  Tobacco Use   Smoking status: Former    Current packs/day: 0.00    Types: Cigarettes    Quit date: 07/01/2019    Years since quitting: 3.4   Smokeless tobacco: Never   Tobacco comments:    1-2 per day Stopped 02/01/2021  Vaping Use   Vaping status: Never Used  Substance and Sexual Activity   Alcohol use: No   Drug use: No   Sexual activity: Yes    Birth control/protection: Surgical, Post-menopausal  Other Topics Concern   Not on file  Social History Narrative   Current Social History 09/12/2020      Patient lives with 2 sons and granddaughter in a one story home. There are 3 steps with handrail up to the entrance the patient uses.       Patient's method of transportation is personal car.      The highest level of education was GED      The patient currently retired from [employer].      Identified important Relationships are "my children, my grandchildren, my great-grandchildren, and my sisters"      Pets : dog and Brewing technologist / Fun: "read, watch movies, go walking, and just do things with the children."      Current Stressors: "dog"      Religious / Personal Beliefs: "Old fashioned [employer]."             Social Determinants of Health   Financial Resource Strain: Low Risk  (03/28/2022)   Overall Financial Resource Strain (CARDIA)    Difficulty of Paying Living Expenses: Not very hard  Food Insecurity: No Food Insecurity (03/28/2022)   Hunger Vital Sign    Worried About Running Out of Food in the Last Year: Never true    Ran Out of Food in the Last Year: Never true  Transportation  Needs: No Transportation Needs (03/28/2022)   PRAPARE - Administrator, Civil Service (Medical): No    Lack of Transportation (Non-Medical): No  Physical  Activity: Insufficiently Active (03/28/2022)   Exercise Vital Sign    Days of Exercise per Week: 2 days    Minutes of Exercise per Session: 30 min  Stress: No Stress Concern Present (03/28/2022)   [employer] of Occupational Health - Occupational Stress Questionnaire    Feeling of Stress : Only a little  Social Connections: Socially Isolated (03/28/2022)   Social Connection and Isolation Panel [NHANES]    Frequency of Communication with Friends and Family: More than three times a week    Frequency of Social Gatherings with Friends and Family: More than three times a week    Attends Religious Services: Never    Database administrator or Organizations: No    Attends Banker Meetings: Never    Marital Status: Widowed  Intimate Partner Violence: Not At Risk (03/28/2022)   Humiliation, Afraid, Rape, and Kick questionnaire    Fear of Current or Ex-Partner: No    Emotionally Abused: No    Physically Abused: No    Sexually Abused: No    Review of Systems  Constitutional:  Negative for chills, fever and weight loss.  Respiratory:  Positive for cough, shortness of breath and wheezing.   Cardiovascular:  Positive for palpitations and PND. Negative for chest pain and leg swelling.  Gastrointestinal:  Positive for abdominal pain and nausea. Negative for blood in stool, constipation, diarrhea and vomiting.  Genitourinary:  Negative for dysuria, frequency and urgency.  Neurological:  Negative for dizziness, focal weakness and headaches.  Psychiatric/Behavioral:  Positive for depression.         Objective    BP 134/86 (BP Location: Left Arm, Patient Position: Sitting, Cuff Size: Large)   Pulse 85   Temp (!) 97.5 F (36.4 C) (Temporal)   Ht 5\' 4"  (1.626 m)   Wt 170 lb (77.1 kg)   LMP 06/21/1973   SpO2 95%   BMI  29.18 kg/m   Physical Exam Constitutional:      General: She is not in acute distress.    Appearance: She is not ill-appearing.  Eyes:     Extraocular Movements: Extraocular movements intact.     Conjunctiva/sclera: Conjunctivae normal.  Cardiovascular:     Rate and Rhythm: Normal rate and regular rhythm.  Pulmonary:     Effort: Pulmonary effort is normal.     Breath sounds: Wheezing present.  Abdominal:     General: Bowel sounds are normal. There is no distension.     Palpations: Abdomen is soft.     Tenderness: There is abdominal tenderness in the right upper quadrant and left upper quadrant. There is no guarding or rebound. Negative signs include McBurney's sign.  Musculoskeletal:     Cervical back: Normal range of motion and neck supple.     Right lower leg: No edema.     Left lower leg: No edema.  Skin:    General: Skin is warm and dry.  Neurological:     General: No focal deficit present.     Mental Status: She is alert and oriented to person, place, and time.     Motor: No weakness.     Coordination: Coordination normal.     Gait: Gait normal.  Psychiatric:        Mood and Affect: Mood normal.        Behavior: Behavior normal.        Thought Content: Thought content normal.         Assessment & Plan:   Problem  List Items Addressed This Visit     Aortic atherosclerosis (HCC)    Continue statin therapy. Check lipids at follow up visit       Relevant Orders   Troponin I (High Sensitivity)   Ambulatory referral to Cardiology   COPD (chronic obstructive pulmonary disease) with emphysema (HCC)    Using Spiriva and Symbicort as prescribed by previous providers. Only using Symbicort once daily. Does not have albuterol at home. She will start Symbicort twice daily. Albuterol prescription sent.  Took Z-pak 2 weeks ago which helped clear cough but no improvement in DOE. She has not seen pulmonology in years that I can tell. Oxygen desaturation at home per patient.  Pulse ox 95% here today. Referral made to pulmonology.       Relevant Medications   albuterol (VENTOLIN HFA) 108 (90 Base) MCG/ACT inhaler   Other Relevant Orders   DG Chest 2 View   Ambulatory referral to Pulmonology   DOE (dyspnea on exertion) - Primary    No acute distress. Check labs including D-dimer, BNP, Troponin and chest X ray. COPD without exacerbation.  EKG shows NSR, rate 75, no acute changes compared to old EKG Increase Symbicort to bid. Continue other medications. Refilled albuterol.        Relevant Orders   EKG 12-Lead   CBC with Differential/Platelet   Brain natriuretic peptide   Comprehensive metabolic panel   D-dimer, quantitative   Troponin I (High Sensitivity)   DG Chest 2 View   Ambulatory referral to Cardiology   Factor V Leiden [facility])    Sees hematology. Continue Xarelto       Relevant Orders   D-dimer, quantitative   History of pulmonary embolism    On Xarelto. Sees hematology      Relevant Orders   D-dimer, quantitative   Troponin I (High Sensitivity)   Ambulatory referral to Pulmonology   Hypertension    Not quite to goal. Continue current medication and monitor BP at home. Encouraged smoking cessation. Check renal function.       Relevant Orders   Ambulatory referral to Cardiology   Osteoporosis    Taking alendronate. Check vitamin D level, hx of def.  Hx of fracture. Consider more aggressive treatment at follow up visit and rechecking DEXA      Palpitations    EKG shows NSR, rate 75, no acute changes compared to old EKG Mod-high risk - check labs including thyroid. Referral to cardiology for DOE and palpitations. DOE is more likely r/t COPD      Relevant Orders   CBC with Differential/Platelet   Comprehensive metabolic panel   Troponin I (High Sensitivity)   TSH   T4, free   Ferritin   Magnesium   DG Chest 2 View   Ambulatory referral to Cardiology   Postprandial abdominal pain in right upper quadrant    Possible  cholecystitis. Lipase and RUQ [place] ordered.       Relevant Orders   CBC with Differential/Platelet   Comprehensive metabolic panel   Lipase   US Abdomen Limited RUQ (LIVER/GB)   Prediabetes    Check A1c      Relevant Orders   CBC with Differential/Platelet   Comprehensive metabolic panel   Hemoglobin A1c   Right upper quadrant abdominal tenderness without rebound tenderness    No acute distress but visibly uncomfortable. Possible early cholecystitis. Lipase and RUQ [place] ordered.       Relevant Orders   US Abdomen Limited RUQ (LIVER/GB)  Smokes    Encourage smoking cessation       Vitamin D deficiency    Check vitamin D level.       Relevant Orders   VITAMIN D 25 Hydroxy (Vit-D Deficiency, Fractures)      Return in about 4 weeks (around 01/21/2023).   Hetty Blend, NP-C

## 2022-12-24 NOTE — Assessment & Plan Note (Addendum)
Using Spiriva and Symbicort as prescribed by previous providers. Only using Symbicort once daily. Does not have albuterol at home. She will start Symbicort twice daily. Albuterol prescription sent.  Took Z-pak 2 weeks ago which helped clear cough but no improvement in DOE. She has not seen pulmonology in years that I can tell. Oxygen desaturation at home per patient. Pulse ox 95% here today. Referral made to pulmonology.

## 2022-12-24 NOTE — Assessment & Plan Note (Signed)
Check vitamin D level 

## 2022-12-24 NOTE — Patient Instructions (Addendum)
Please go downstairs for labs and a chest X ray.   You will receive a call to schedule an ultrasound of your upper abdomen.   I am referring you to cardiology and pulmonology and they will call you to schedule.   If your shortness of breath gets worse or if you have chest pain, please call 911 or go to the closest emergency department.

## 2022-12-24 NOTE — Assessment & Plan Note (Addendum)
No acute distress. Check labs including D-dimer, BNP, Troponin and chest X ray. COPD without exacerbation.  EKG shows NSR, rate 75, no acute changes compared to old EKG Increase Symbicort to bid. Continue other medications. Refilled albuterol.

## 2022-12-24 NOTE — Assessment & Plan Note (Signed)
Sees hematology. Continue Xarelto

## 2022-12-25 ENCOUNTER — Other Ambulatory Visit: Payer: Self-pay | Admitting: Family Medicine

## 2022-12-25 ENCOUNTER — Encounter: Payer: Self-pay | Admitting: Family Medicine

## 2022-12-25 DIAGNOSIS — R918 Other nonspecific abnormal finding of lung field: Secondary | ICD-10-CM

## 2022-12-25 DIAGNOSIS — R911 Solitary pulmonary nodule: Secondary | ICD-10-CM

## 2022-12-25 NOTE — Progress Notes (Signed)
Her labs show that she now has diabetes. She should cut back on sugar and carbohydrates such as bread, potatoes, pasta, and rice. Please schedule ov in 2 weeks (instead of 4 wks like I said yesterday). Also, see note regarding chest X ray and need for CT.

## 2022-12-25 NOTE — Progress Notes (Signed)
Her chest x-ray showed an area of concern in the left lung.  The radiologist recommends getting a CT to get more information about whether this is an infection or a possible lung nodule.  I we will order the CT and she should be contacted to get this done.

## 2022-12-27 ENCOUNTER — Telehealth: Payer: Self-pay | Admitting: Family Medicine

## 2022-12-27 ENCOUNTER — Telehealth: Payer: Self-pay

## 2022-12-27 ENCOUNTER — Ambulatory Visit
Admission: RE | Admit: 2022-12-27 | Discharge: 2022-12-27 | Disposition: A | Discharge status: Home / Self Care | Payer: Medicare HMO | Source: Ambulatory Visit | Attending: Family Medicine | Admitting: Family Medicine

## 2022-12-27 ENCOUNTER — Other Ambulatory Visit: Payer: Self-pay | Admitting: Family Medicine

## 2022-12-27 DIAGNOSIS — J439 Emphysema, unspecified: Secondary | ICD-10-CM | POA: Diagnosis not present

## 2022-12-27 DIAGNOSIS — R1011 Right upper quadrant pain: Secondary | ICD-10-CM | POA: Diagnosis not present

## 2022-12-27 DIAGNOSIS — D491 Neoplasm of unspecified behavior of respiratory system: Secondary | ICD-10-CM

## 2022-12-27 DIAGNOSIS — R911 Solitary pulmonary nodule: Secondary | ICD-10-CM

## 2022-12-27 DIAGNOSIS — I700 Atherosclerosis of aorta: Secondary | ICD-10-CM | POA: Diagnosis not present

## 2022-12-27 DIAGNOSIS — R10811 Right upper quadrant abdominal tenderness: Secondary | ICD-10-CM

## 2022-12-27 DIAGNOSIS — R918 Other nonspecific abnormal finding of lung field: Secondary | ICD-10-CM

## 2022-12-27 MED ORDER — PANTOPRAZOLE SODIUM 40 MG PO TBEC: 40.0000 mg | DELAYED_RELEASE_TABLET | Freq: Every day | ORAL | 1 refills | Status: DC

## 2022-12-27 NOTE — Progress Notes (Signed)
Called and spoke with patient regarding findings. She will follow up if pain is worsening and we will refer to GI but she wants to hold off until she sees oncology and has her PET scan.

## 2022-12-27 NOTE — Telephone Encounter (Signed)
Fyi, if you think pt needs to be seen sooner we would need to change urgency

## 2022-12-27 NOTE — Telephone Encounter (Signed)
Patient can not can an appointment with the cardiologist till mid February - she is going to take that appointment - they told her if you wanted her seen before then that Vickie would need to change the urgency of the referral.  Please let patient know.

## 2022-12-27 NOTE — Progress Notes (Signed)
Called and spoke with patient regarding findings and the next steps of PET scan and oncology referral.

## 2022-12-27 NOTE — Telephone Encounter (Signed)
Radiology called to report on pt STAT CT CHEST making sure Vickie could read it, advised we have received result and Larene Beach is working on it now

## 2022-12-30 ENCOUNTER — Other Ambulatory Visit: Payer: Self-pay | Admitting: Family Medicine

## 2022-12-30 DIAGNOSIS — R911 Solitary pulmonary nodule: Secondary | ICD-10-CM

## 2022-12-30 DIAGNOSIS — D491 Neoplasm of unspecified behavior of respiratory system: Secondary | ICD-10-CM

## 2022-12-30 DIAGNOSIS — R918 Other nonspecific abnormal finding of lung field: Secondary | ICD-10-CM

## 2022-12-31 NOTE — Progress Notes (Unsigned)
Cardiology Office Note:  .   Date:  01/01/2023  ID:  Marcell Barlow, DOB [birth year], MRN [id number] PCP: Avanell Shackleton, NP-C   HeartCare Providers Cardiologist:  None { History of Present Illness: .   SHAZIA WEHRLY is a 72 y.o. female with history of COPD, Lung CA, HTN, HLD, PE who presents for the evaluation of palpitations at the request of Suezanne Jacquet, Vickie L, NP-C.   History of Present Illness   Miss Letner, a 72 year old with a history of COPD, factor V Leiden mutation, PE, hypertension, and recently diagnosed lung cancer, presents with palpitations. She reports experiencing shortness of breath with activity and an increase in heart rate, which she has noticed for about six months. The patient also mentions significant emotional stress due to recent family losses, which she believes may be affecting her heart. She also reports poor sleep. She has been recently diagnosed with lung cancer and is awaiting a PET scan. She has a history of blood clots and is on Xarelto for the same. Her A1c has recently increased over 6.0 for the first time in her life, and she attributes this to the stress of the past three months. She is also on pravastatin for cholesterol. She reports a history of smoking since she was 16, with several attempts to quit. She has been experiencing muscle spasms, which she describes as not really chest pains. She also reports a history of ventricular tachycardia back in 1988 and 1989, for which she underwent four heart catheterizations but no cause was found. No CP reported. Family history of CAD is noted.          Problem List COPD Factor V Leiden  PE HTN Lung CA HLD -T chol 183, HDL 43, LDL 101, TG 227 7. DM -A1c 7.3 8. Mild coronary calcification  -chest CT 12/2022    ROS: All other ROS reviewed and negative. Pertinent positives noted in the HPI.     Studies Reviewed: [name]        EKG 12/24/2022: NSR 90 bpm, anteroseptal infarct   TTE 02/02/2021  1. Left  ventricular ejection fraction, by estimation, is 55 to 60%. The  left ventricle has normal function. The left ventricle has no regional  wall motion abnormalities. Left ventricular diastolic parameters are  consistent with Grade I diastolic  dysfunction (impaired relaxation).   2. Right ventricular systolic function is normal. The right ventricular  size is normal. Tricuspid regurgitation signal is inadequate for assessing  PA pressure.   3. The mitral valve is normal in structure. Trivial mitral valve  regurgitation.   4. The aortic valve is tricuspid. Aortic valve regurgitation is trivial.  No aortic stenosis is present.   5. The inferior vena cava is normal in size with greater than 50%  respiratory variability, suggesting right atrial pressure of 3 mmHg.   6. Agitated saline contrast bubble study was negative, with no evidence  of any interatrial shunt.  Physical Exam:   VS:  BP 130/80 (BP Location: Left Arm, Patient Position: Sitting, Cuff Size: Normal)   Pulse 85   Ht 5\' 4"  (1.626 m)   Wt 171 lb 12.8 oz (77.9 kg)   LMP 06/21/1973   SpO2 95%   BMI 29.49 kg/m    Wt Readings from Last 3 Encounters:  01/01/23 171 lb 12.8 oz (77.9 kg)  12/24/22 170 lb (77.1 kg)  07/31/22 176 lb 8 oz (80.1 kg)    GEN: Well nourished, well developed in no acute  distress NECK: No JVD; No carotid bruits CARDIAC: RRR, no murmurs, rubs, gallops RESPIRATORY:  Clear to auscultation without rales, wheezing or rhonchi  ABDOMEN: Soft, non-tender, non-distended EXTREMITIES:  No edema; No deformity  ASSESSMENT AND PLAN: .   Assessment and Plan    Palpitations Noted increased heart rate with activity for the past six months. No chest pain.  -Recent significant emotional stress. Normal EKG and heart ultrasound last year. -Order 7-day heart monitor to evaluate heart rate changes and palpitations.  COPD SOB Reports shortness of breath with activity. Recent diagnosis of lung cancer. Mild coronary  calcification on recent chest CT. No signs of heart failure on exam. -Continue current management and follow-up with pulmonologist. -suspect SOB is more COPD related.   Hyperlipidemia Diabetic with LDL goal less than 70. Currently on pravastatin. -Discontinue pravastatin and start Lipitor 20mg  daily.  Hypertension Well controlled on current medications. -Continue current medications.              Follow-up: Return if symptoms worsen or fail to improve.   Signed, Lenna Gilford. Flora Lipps, MD, [facility]  [facility]  [address] [place], [place] [address] [phone]  9:37 AM

## 2023-01-01 ENCOUNTER — Ambulatory Visit (INDEPENDENT_AMBULATORY_CARE_PROVIDER_SITE_OTHER): Payer: Medicare HMO

## 2023-01-01 ENCOUNTER — Ambulatory Visit: Payer: Medicare HMO | Attending: Cardiovascular Disease | Admitting: Cardiovascular Disease

## 2023-01-01 ENCOUNTER — Encounter: Payer: Self-pay | Admitting: Cardiovascular Disease

## 2023-01-01 VITALS — BP 130/80 | HR 85 | Ht 64.0 in | Wt 171.8 lb

## 2023-01-01 DIAGNOSIS — R002 Palpitations: Secondary | ICD-10-CM

## 2023-01-01 DIAGNOSIS — I150 Renovascular hypertension: Secondary | ICD-10-CM

## 2023-01-01 DIAGNOSIS — R0602 Shortness of breath: Secondary | ICD-10-CM | POA: Diagnosis not present

## 2023-01-01 DIAGNOSIS — E782 Mixed hyperlipidemia: Secondary | ICD-10-CM | POA: Diagnosis not present

## 2023-01-01 MED ORDER — ATORVASTATIN CALCIUM 20 MG PO TABS: 20.0000 mg | ORAL_TABLET | Freq: Every day | ORAL | 3 refills | Status: DC

## 2023-01-01 NOTE — Patient Instructions (Signed)
Medication Instructions:  - stop pravastatin - START atorvastatin (LIPITOR)  20MG  ONCE A DAY    *If you need a refill on your cardiac medications before your next appointment, please call your pharmacy*   Lab Work: NONE    If you have labs (blood work) drawn today and your tests are completely normal, you will receive your results only by: MyChart Message (if you have MyChart) OR A paper copy in the mail If you have any lab test that is abnormal or we need to change your treatment, we will call you to review the results.   Testing/Procedures: Christena Deem- Long Term Monitor Instructions  Your physician has requested you wear a ZIO patch monitor for 7 days.  This is a single patch monitor. [employer] supplies one patch monitor per enrollment. Additional stickers are not available. Please do not apply patch if you will be having a Nuclear Stress Test,  Echocardiogram, Cardiac CT, MRI, or Chest Xray during the period you would be wearing the  monitor. The patch cannot be worn during these tests. You cannot remove and re-apply the  ZIO XT patch monitor.  Your ZIO patch monitor will be mailed 3 day USPS to your address on file. It may take 3-5 days  to receive your monitor after you have been enrolled.  Once you have received your monitor, please review the enclosed instructions. Your monitor  has already been registered assigning a specific monitor serial # to you.  Billing and Patient Assistance Program Information  We have supplied [employer] with any of your insurance information on file for billing purposes. [employer] offers a sliding scale Patient Assistance Program for patients that do not have  insurance, or whose insurance does not completely cover the cost of the ZIO monitor.  You must apply for the Patient Assistance Program to qualify for this discounted rate.  To apply, please call [employer] at [phone], select option 4, select option 2, ask to apply for  Patient Assistance  Program. Meredeth Ide will ask your household income, and how many people  are in your household. They will quote your out-of-pocket cost based on that information.  [employer] will also be able to set up a 80-month, interest-free payment plan if needed.  Applying the monitor   Shave hair from upper left chest.  Hold abrader disc by orange tab. Rub abrader in 40 strokes over the upper left chest as  indicated in your monitor instructions.  Clean area with 4 enclosed alcohol pads. Let dry.  Apply patch as indicated in monitor instructions. Patch will be placed under collarbone on left  side of chest with arrow pointing upward.  Rub patch adhesive wings for 2 minutes. Remove white label marked "1". Remove the white  label marked "2". Rub patch adhesive wings for 2 additional minutes.  While looking in a mirror, press and release button in center of patch. A small green light will  flash 3-4 times. This will be your only indicator that the monitor has been turned on.  Do not shower for the first 24 hours. You may shower after the first 24 hours.  Press the button if you feel a symptom. You will hear a small click. Record Date, Time and  Symptom in the Patient Logbook.  When you are ready to remove the patch, follow instructions on the last 2 pages of Patient  Logbook. Stick patch monitor onto the last page of Patient Logbook.  Place Patient Logbook in the blue and white box. Use  locking tab on box and tape box closed  securely. The blue and white box has prepaid postage on it. Please place it in the mailbox as  soon as possible. Your physician should have your test results approximately 7 days after the  monitor has been mailed back to [facility].  Call [facility] Customer Care at [phone] if you have questions regarding  your ZIO XT patch monitor. Call them immediately if you see an orange light blinking on your  monitor.  If your monitor falls off in less than 4 days, contact our  Monitor department at [phone].  If your monitor becomes loose or falls off after 4 days call [employer] at [phone] for  suggestions on securing your monitor    Follow-Up: At [facility] you and your health needs are our priority.  As part of our continuing mission to provide you with exceptional heart care, we have created designated Provider Care Teams.  These Care Teams include your primary Cardiologist (physician) and Advanced Practice Providers (APPs -  Physician Assistants and Nurse Practitioners) who all work together to provide you with the care you need, when you need it.  We recommend signing up for the patient portal called "MyChart".  Sign up information is provided on this After Visit Summary.  MyChart is used to connect with patients for Virtual Visits (Telemedicine).  Patients are able to view lab/test results, encounter notes, upcoming appointments, etc.  Non-urgent messages can be sent to your provider as well.   To learn more about what you can do with MyChart, go to [email or web address].    Your next appointment:   Follow up as needed pending results of cardiac test  Provider:   Lennie Odor, MD   Other Instructions

## 2023-01-01 NOTE — Progress Notes (Unsigned)
Enrolled patient for a 7 da Zio XT monitor to be mailed to patients home

## 2023-01-04 DIAGNOSIS — R002 Palpitations: Secondary | ICD-10-CM | POA: Diagnosis not present

## 2023-01-08 ENCOUNTER — Ambulatory Visit: Payer: Medicare HMO | Admitting: Family Medicine

## 2023-01-08 ENCOUNTER — Encounter: Payer: Self-pay | Admitting: Family Medicine

## 2023-01-08 VITALS — BP 134/82 | HR 78 | Temp 97.6°F | Ht 64.0 in | Wt 172.0 lb

## 2023-01-08 DIAGNOSIS — E119 Type 2 diabetes mellitus without complications: Secondary | ICD-10-CM

## 2023-01-08 DIAGNOSIS — D6851 Activated protein C resistance: Secondary | ICD-10-CM | POA: Diagnosis not present

## 2023-01-08 DIAGNOSIS — I10 Essential (primary) hypertension: Secondary | ICD-10-CM

## 2023-01-08 DIAGNOSIS — E1122 Type 2 diabetes mellitus with diabetic chronic kidney disease: Secondary | ICD-10-CM | POA: Insufficient documentation

## 2023-01-08 DIAGNOSIS — R1013 Epigastric pain: Secondary | ICD-10-CM

## 2023-01-08 DIAGNOSIS — J439 Emphysema, unspecified: Secondary | ICD-10-CM | POA: Diagnosis not present

## 2023-01-08 DIAGNOSIS — R1011 Right upper quadrant pain: Secondary | ICD-10-CM

## 2023-01-08 DIAGNOSIS — R198 Other specified symptoms and signs involving the digestive system and abdomen: Secondary | ICD-10-CM | POA: Diagnosis not present

## 2023-01-08 DIAGNOSIS — G8929 Other chronic pain: Secondary | ICD-10-CM | POA: Diagnosis not present

## 2023-01-08 NOTE — Assessment & Plan Note (Signed)
Breathing improved since using Symbicort bid.  Upcoming PET scan for suspicious primary lung cancer.  Appt with oncologist scheduled.

## 2023-01-08 NOTE — Patient Instructions (Addendum)
Work on cutting back sugar and carbohydrates (potatoes, bread, pasta, rice)   Eat a low fat diet (for your gallbladder)  Keep up the good work with not smoking.   You will hear from [facility] to schedule a visit regarding your abdominal pain and gallbladder.   If your abdominal pain gets much worse or if you develop fever, chills, vomiting, then please go to the emergency department.

## 2023-01-08 NOTE — Progress Notes (Signed)
Subjective:     Patient ID: Kimberly Walters, female    DOB: 06-08-1950, 72 y.o.   MRN: [id number]  Chief Complaint  Patient presents with   Diabetes    HPI   History of Present Illness         Here for follow up on new diabetes diagnosis.  Her A1c is 7.3%.  Previously A1c was in prediabetes range.  She does have a strong family history of diabetes.  She does not want to start on metformin.  She prefers to have a trial of improving diet and increasing exercise before starting on diabetes medication.   COPD -reports her breathing has improved since starting on Symbicort twice daily. No sign of heart failure.  She has been referred to pulmonology.  She also states she stopped smoking several days ago.   Recent finding of suspicious lung nodule on CT. PET scan is scheduled for later this week.  Appointment for 01/20/2023 scheduled with Dr. Arbutus Ped.  She also complains of epigastric pain that seems to be worsening.  She does have a history of the same.  Recent RUQ ultrasound showed positive Murphy sign and negative for cholelithiasis or gallbladder wall thickening.  She has seen [facility] in the past.  Reports taking alendronate but not consistently  On Xarelto -factor V Leiden mutation, PE  She saw cardiologist since our last visit.  She is currently wearing a heart monitor. 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    Gross hematuria    Hyperlipidemia    Hypertension    Mitral valve prolapse    Paroxysmal  atrial fibrillation with RVR (HCC)    Pleural effusion on left    Post-thrombotic syndrome of left lower extremity    Primary hypercoagulable state (HCC)    Pulmonary emboli (HCC)    Pyelonephritis    Upper respiratory infection, viral 04/02/2021    Past Surgical History:  Procedure Laterality Date   BIOPSY  07/19/2020   Procedure: BIOPSY;  Surgeon: Napoleon Form, MD;  Location: [facility] END[facility]COPY;  Service: Endoscopy;;   COLONOSCOPY     ESOPHAGOGASTRODUODENOSCOPY (EGD) WITH PROPOFOL N/A 07/19/2020   Procedure: ESOPHAGOGASTRODUODENOSCOPY (EGD) WITH PROPOFOL;  Surgeon: Napoleon Form, MD;  Location: [facility] ENDOSCOPY;  Service: Endoscopy;  Laterality: N/A;   HYSTERECTOMY ABDOMINAL WITH SALPINGO-OOPHORECTOMY  1975   LYMPH NODE BIOPSY Right 1983   Neck    Family History  Problem Relation Age of Onset   Diabetes Mother    Heart disease Mother    Cancer Mother        Uterine   Diabetes Father    CAD Father    Heart attack Father    Hypothyroidism Sister    Melanoma Sister    Clotting disorder Sister    Cancer Sister        stomach   Diabetes Sister    Diabetes Sister    Hypertension Sister  Hypothyroidism Sister    Diabetes Sister    Hypertension Sister    Hypothyroidism Sister    Cancer Sister    Cancer Sister    Diabetes Daughter    Diabetes Son    Diabetes Son    Pancreatic cancer Nephew    Uterine cancer Niece    Ovarian cancer Other    Uterine cancer Other    Breast cancer Other    Breast cancer Half-Sister    Colon cancer Neg Hx    Esophageal cancer Neg Hx     Social History   Socioeconomic History   Marital status: Widowed    Spouse name: Not on file   Number of children: 3   Years of education: Not on file   Highest education level: GED or equivalent  Occupational History   Occupation: Retired    Associate Professor: [employer]  Tobacco Use   Smoking status: Former    Current packs/day: 0.00    Average packs/day: 0.5 packs/day for 55.0 years (27.5 ttl  pk-yrs)    Types: Cigarettes    Quit date: 07/01/2019    Years since quitting: 3.5   Smokeless tobacco: Never   Tobacco comments:    1-2 per day Stopped 02/01/2021  Vaping Use   Vaping status: Never Used  Substance and Sexual Activity   Alcohol use: No   Drug use: No   Sexual activity: Yes    Birth control/protection: Surgical, Post-menopausal  Other Topics Concern   Not on file  Social History Narrative   Current Social History 09/12/2020      Patient lives with 2 sons and granddaughter in a one story home. There are 3 steps with handrail up to the entrance the patient uses.       Patient's method of transportation is personal car.      The highest level of education was GED      The patient currently retired from [employer].      Identified important Relationships are "my children, my grandchildren, my great-grandchildren, and my sisters"      Pets : dog and Brewing technologist / Fun: "read, watch movies, go walking, and just do things with the children."      Current Stressors: "dog"      Religious / Personal Beliefs: "Old fashioned [employer]."             Social Drivers of Health   Financial Resource Strain: Low Risk  (03/28/2022)   Overall Financial Resource Strain (CARDIA)    Difficulty of Paying Living Expenses: Not very hard  Food Insecurity: No Food Insecurity (03/28/2022)   Hunger Vital Sign    Worried About Running Out of Food in the Last Year: Never true    Ran Out of Food in the Last Year: Never true  Transportation Needs: No Transportation Needs (03/28/2022)   PRAPARE - Administrator, Civil Service (Medical): No    Lack of Transportation (Non-Medical): No  Physical Activity: Insufficiently Active (03/28/2022)   Exercise Vital Sign    Days of Exercise per Week: 2 days    Minutes of Exercise per Session: 30 min  Stress: No Stress Concern Present (03/28/2022)   [employer] of Occupational Health - Occupational Stress Questionnaire    Feeling  of Stress : Only a little  Social Connections: Socially Isolated (03/28/2022)   Social Connection and Isolation Panel [NHANES]    Frequency of Communication with Friends and Family: More than  three times a week    Frequency of Social Gatherings with Friends and Family: More than three times a week    Attends Religious Services: Never    Database administrator or Organizations: No    Attends Banker Meetings: Never    Marital Status: Widowed  Intimate Partner Violence: Not At Risk (03/28/2022)   Humiliation, Afraid, Rape, and Kick questionnaire    Fear of Current or Ex-Partner: No    Emotionally Abused: No    Physically Abused: No    Sexually Abused: No    Outpatient Medications Prior to Visit  Medication Sig Dispense Refill   acetaminophen (TYLENOL) 500 MG tablet Take 500 mg by mouth every 6 (six) hours as needed for moderate pain.     albuterol (VENTOLIN HFA) 108 (90 Base) MCG/ACT inhaler Inhale 2 puffs into the lungs every 6 (six) hours as needed for wheezing or shortness of breath. 8 g 0   alendronate (FOSAMAX) 70 MG tablet Take 1 tablet (70 mg total) by mouth once a week. Take with a full glass of water on an empty stomach. 12 tablet 2   atorvastatin (LIPITOR) 20 MG tablet Take 1 tablet (20 mg total) by mouth daily. 90 tablet 3   cholecalciferol (VITAMIN D3) 25 MCG (1000 UNIT) tablet Take 1 tablet (1,000 Units total) by mouth daily. 90 tablet 0   lisinopril (ZESTRIL) 20 MG tablet Take 1 tablet (20 mg total) by mouth daily. 90 tablet 3   pantoprazole (PROTONIX) 40 MG tablet Take 1 tablet (40 mg total) by mouth daily. 30 tablet 1   rivaroxaban (XARELTO) 20 MG TABS tablet TAKE 1 TABLET BY MOUTH ONCE DAILY WITH SUPPER 30 tablet 2   SPIRIVA RESPIMAT 2.5 MCG/ACT AERS INHALE 2 SPRAY(S) BY MOUTH ONCE DAILY AT  8  PM 4 g 0   SYMBICORT 80-4.5 MCG/ACT inhaler Inhale 2 puffs by mouth twice daily 11 g 0   No facility-administered medications prior to visit.    No Known  Allergies  Review of Systems  Constitutional:  Positive for malaise/fatigue. Negative for chills and fever.  Respiratory:  Positive for shortness of breath.   Cardiovascular:  Negative for chest pain, palpitations and leg swelling.  Gastrointestinal:  Positive for abdominal pain and nausea. Negative for constipation, diarrhea and vomiting.  Genitourinary:  Negative for dysuria, frequency and urgency.  Neurological:  Negative for dizziness and focal weakness.  Psychiatric/Behavioral:  Negative for suicidal ideas. The patient is nervous/anxious.        Objective:    Physical Exam Constitutional:      General: She is not in acute distress.    Appearance: She is not ill-appearing.  Eyes:     Extraocular Movements: Extraocular movements intact.     Conjunctiva/sclera: Conjunctivae normal.  Cardiovascular:     Rate and Rhythm: Normal rate.  Pulmonary:     Effort: Pulmonary effort is normal.  Musculoskeletal:     Cervical back: Normal range of motion and neck supple.  Skin:    General: Skin is warm and dry.  Neurological:     General: No focal deficit present.     Mental Status: She is alert and oriented to person, place, and time.  Psychiatric:        Mood and Affect: Mood normal.        Behavior: Behavior normal.        Thought Content: Thought content normal.      BP 134/82 (BP Location: Left Arm,  Patient Position: Sitting, Cuff Size: Large)   Pulse 78   Temp 97.6 F (36.4 C) (Temporal)   Ht 5\' 4"  (1.626 m)   Wt 172 lb (78 kg)   LMP 06/21/1973   SpO2 93%   BMI 29.52 kg/m  Wt Readings from Last 3 Encounters:  01/08/23 172 lb (78 kg)  01/01/23 171 lb 12.8 oz (77.9 kg)  12/24/22 170 lb (77.1 kg)       Assessment & Plan:   Problem List Items Addressed This Visit     Abdominal pain, chronic, epigastric   Worsening. On Protonix. Recent RUQ US showed positive Murphy's w/o stones or wall thickening. Referral to GI      Relevant Orders   Ambulatory referral to  Gastroenterology   COPD (chronic obstructive pulmonary disease) with emphysema (HCC)   Breathing improved since using Symbicort bid.  Upcoming PET scan for suspicious primary lung cancer.  Appt with oncologist scheduled.        Factor V Leiden (HCC)   Hypertension   Fairly well controlled. Wearing a heart monitor today. She saw cardiology last week. Continue current medications.       New onset type 2 diabetes mellitus (HCC) - Primary   Work on diet and cutting back on sugar and bread. Follow up with me in 4-6 weeks.       Right upper quadrant abdominal pain with positive Murphy Sign   Referral to GI. Recommended Hida scan      Relevant Orders   Ambulatory referral to Gastroenterology    I am having Florena L. Spargur maintain her acetaminophen, alendronate, lisinopril, Xarelto, cholecalciferol, Spiriva Respimat, albuterol, Symbicort, pantoprazole, and atorvastatin.  No orders of the defined types were placed in this encounter.

## 2023-01-08 NOTE — Assessment & Plan Note (Signed)
Worsening. On Protonix. Recent RUQ US showed positive Murphy's w/o stones or wall thickening. Referral to GI

## 2023-01-08 NOTE — Assessment & Plan Note (Signed)
Referral to GI. Recommended Hida scan

## 2023-01-08 NOTE — Assessment & Plan Note (Signed)
Work on diet and cutting back on sugar and bread. Follow up with me in 4-6 weeks.

## 2023-01-08 NOTE — Assessment & Plan Note (Signed)
Fairly well controlled. Wearing a heart monitor today. She saw cardiology last week. Continue current medications.

## 2023-01-09 ENCOUNTER — Telehealth: Payer: Self-pay | Admitting: Gastroenterology

## 2023-01-09 NOTE — Telephone Encounter (Signed)
Spoke with patient & she says she has been having upper abdominal pain, bloating/gas, and nausea with meals for the last 3 months. PCP prescribed pantoprazole once daily, which does not provide any relief. She's also taking pepto every other day. Advised her I would reach out to her tomorrow 01/10/23 to try & schedule her for one of our 7 day hold slots for 01/17/23 at 3:00 pm. Pt verbalized all understanding.

## 2023-01-09 NOTE — Telephone Encounter (Signed)
Inbound call from patient stating she is having abdominal pain and does not think she can "make it "to her appointment on 1/7 due to being in pain. Patient is requesting a call to discuss. Please advise.

## 2023-01-10 ENCOUNTER — Encounter (HOSPITAL_COMMUNITY)
Admission: RE | Admit: 2023-01-10 | Discharge: 2023-01-10 | Disposition: A | Discharge status: Home / Self Care | Payer: Medicare HMO | Source: Ambulatory Visit | Attending: Family Medicine | Admitting: Family Medicine

## 2023-01-10 DIAGNOSIS — R918 Other nonspecific abnormal finding of lung field: Secondary | ICD-10-CM | POA: Diagnosis not present

## 2023-01-10 DIAGNOSIS — R911 Solitary pulmonary nodule: Secondary | ICD-10-CM | POA: Diagnosis not present

## 2023-01-10 DIAGNOSIS — D491 Neoplasm of unspecified behavior of respiratory system: Secondary | ICD-10-CM | POA: Diagnosis not present

## 2023-01-10 LAB — GLUCOSE, CAPILLARY: Glucose-Capillary: 112 mg/dL — ABNORMAL HIGH (ref 70–99)

## 2023-01-10 MED ORDER — FLUDEOXYGLUCOSE F - 18 (FDG) INJECTION
8.6000 | Freq: Once | INTRAVENOUS | Status: AC | PRN
  Administered 2023-01-10: 8.6 via INTRAVENOUS

## 2023-01-10 NOTE — Telephone Encounter (Signed)
Patient has been scheduled for OV on 01/17/23 at 2:30 pm with [place], [place]. Pt is aware & advised on when/where to go.

## 2023-01-10 NOTE — Progress Notes (Addendum)
The CT chest and PET scan show a spot to the Left Lower Lung, but the reason is not clear.    We should refer you to Pulmonary for further consideration.  I will do this and hopefully you will hear soon.

## 2023-01-10 NOTE — Progress Notes (Signed)
Please ask one of MDs in the office to look at this and advise. Thanks.

## 2023-01-10 NOTE — Addendum Note (Signed)
Addended by: Corwin Levins on: 01/10/2023 04:46 PM   Modules accepted: Orders

## 2023-01-12 ENCOUNTER — Other Ambulatory Visit: Payer: Self-pay | Admitting: Internal Medicine

## 2023-01-14 ENCOUNTER — Other Ambulatory Visit: Payer: Self-pay | Admitting: Internal Medicine

## 2023-01-14 DIAGNOSIS — J439 Emphysema, unspecified: Secondary | ICD-10-CM

## 2023-01-15 DIAGNOSIS — R002 Palpitations: Secondary | ICD-10-CM | POA: Diagnosis not present

## 2023-01-16 ENCOUNTER — Other Ambulatory Visit: Payer: Self-pay

## 2023-01-16 DIAGNOSIS — D491 Neoplasm of unspecified behavior of respiratory system: Secondary | ICD-10-CM

## 2023-01-17 ENCOUNTER — Other Ambulatory Visit: Payer: Self-pay

## 2023-01-17 ENCOUNTER — Ambulatory Visit: Payer: Medicare HMO | Admitting: Gastroenterology

## 2023-01-17 ENCOUNTER — Encounter: Payer: Self-pay | Admitting: Gastroenterology

## 2023-01-17 ENCOUNTER — Other Ambulatory Visit: Payer: Self-pay | Admitting: Internal Medicine

## 2023-01-17 VITALS — BP 154/90 | HR 84 | Ht 63.0 in | Wt 173.2 lb

## 2023-01-17 DIAGNOSIS — R110 Nausea: Secondary | ICD-10-CM | POA: Diagnosis not present

## 2023-01-17 DIAGNOSIS — R140 Abdominal distension (gaseous): Secondary | ICD-10-CM | POA: Diagnosis not present

## 2023-01-17 DIAGNOSIS — R1013 Epigastric pain: Secondary | ICD-10-CM

## 2023-01-17 DIAGNOSIS — Z8619 Personal history of other infectious and parasitic diseases: Secondary | ICD-10-CM | POA: Diagnosis not present

## 2023-01-17 DIAGNOSIS — D491 Neoplasm of unspecified behavior of respiratory system: Secondary | ICD-10-CM

## 2023-01-17 NOTE — Progress Notes (Signed)
Chief Complaint: Epigastric pain Primary GI Doctor: Dr. Myrtie Neither  HPI:  72 year old female patient that presents for follow-up with past medical history of H. Pylori, COPD, mitral valve prolapse, DVT and PE (on Eliquis) who was referred to me by Avanell Shackleton, NP-C on 01/08/2023 for a complaint of epigastric abdominal pain, nausea, and bloating. Accompanied by her sister.  Interval History:   Patient reports she has been having gastrointestinal issues for the past few months. She has upper abdominal pain that she describes as sharp stabbing pain and burning. The pain occurs everyday and can last 30 minutes to several hours. It will sometimes wake her up at night. She reports poor appetite due to fear of the abdominal pain. Associated nausea with th abdominal pain and bloating. She has one bowel movement every day. No rectal bleeding. Reported dark stools with Pepto Bismul, once she discontinued they are brown. Pepto Bismol helped sometimes, but then caused constipation. Eating food makes her feel more bloated. No particular food triggers. No new medications. No recent travel. Patient taking pantoprazole 40 mg po daily, restarted few weeks ago by PCP with no improvement in symptoms. Denies chest pain or shortness of breath. No NSAID use.     Patient lost her granddaughter end of August and has been helping taking care of her child. She becomes tearful during visit and states this has been very stressful for the family.      Her sister also expresses concern for her gallbladder, states she had same issues as her sister and requests for further evaluation with HIDA.   Wt Readings from Last 3 Encounters:  01/17/23 173 lb 4 oz (78.6 kg)  01/08/23 172 lb (78 kg)  01/01/23 171 lb 12.8 oz (77.9 kg)    Past Medical History:  Diagnosis Date   Anticoagulant-induced bleeding (HCC)    Aortic atherosclerosis (HCC)    Bereavement 11/16/2018   Clotting disorder (HCC)    Diabetes mellitus without  complication (HCC)    Emphysema, unspecified (HCC)    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    Gross hematuria    Hyperlipidemia    Hypertension    Mitral valve prolapse    Paroxysmal atrial fibrillation with RVR (HCC)    Pleural effusion on left    Post-thrombotic syndrome of left lower extremity    Primary hypercoagulable state (HCC)    Pulmonary emboli (HCC)    Pyelonephritis    Upper respiratory infection, viral 04/02/2021    Past Surgical History:  Procedure Laterality Date   BIOPSY  07/19/2020   Procedure: BIOPSY;  Surgeon: Napoleon Form, MD;  Location: [facility];  Service: Endoscopy;;   COLONOSCOPY     ESOPHAGOGASTRODUODENOSCOPY (EGD) WITH PROPOFOL N/A 07/19/2020   Procedure: ESOPHAGOGASTRODUODENOSCOPY (EGD) WITH PROPOFOL;  Surgeon: Napoleon Form, MD;  Location: [facility];  Service: Endoscopy;  Laterality: N/A;   HYSTERECTOMY ABDOMINAL WITH SALPINGO-OOPHORECTOMY  1975   LYMPH NODE BIOPSY Right 1983   Neck   Allergies as of 01/17/2023   (No Known Allergies)   Family History  Problem Relation Age of Onset   Diabetes Mother    Heart disease Mother    Cancer Mother        Uterine   Diabetes Father    CAD Father    Heart attack Father    Hypothyroidism Sister    Melanoma Sister    Clotting disorder Sister    Stomach cancer Sister    Diabetes Sister    Diabetes  Sister    Hypertension Sister    Hypothyroidism Sister    Diabetes Sister    Hypertension Sister    Hypothyroidism Sister    Cancer Sister    Cancer Sister    Diabetes Daughter    Diabetes Son    Diabetes Son    Breast cancer Half-Sister    Pancreatic cancer Nephew    Uterine cancer Niece    Ovarian cancer Other    Uterine cancer Other    Breast cancer Other    Colon cancer Neg Hx    Esophageal cancer Neg Hx    Review of Systems:    Constitutional: No weight loss, fever, chills, weakness or fatigue HEENT: Eyes: No change in vision               Ears, Nose, Throat:  No change in  hearing or congestion Skin: No rash or itching Cardiovascular: No chest pain, chest pressure or palpitations   Respiratory: No SOB or cough Gastrointestinal: See HPI and otherwise negative Genitourinary: No dysuria or change in urinary frequency Neurological: No headache, dizziness or syncope Musculoskeletal: No new muscle or joint pain Hematologic: No bleeding or bruising Psychiatric: No history of depression or anxiety   Physical Exam:  Vital signs: BP (!) 154/90 (BP Location: Left Arm, Patient Position: Sitting, Cuff Size: Normal)   Pulse 84   Ht 5\' 3"  (1.6 m)   Wt 173 lb 4 oz (78.6 kg)   LMP 06/21/1973   BMI 30.69 kg/m   Constitutional:   Pleasant Caucasian female appears to be in NAD, Well developed, Well nourished, alert and cooperative.  Respiratory: Respirations even and unlabored. Lungs clear to auscultation bilaterally.   No wheezes, crackles, or rhonchi.  Cardiovascular: Normal S1, S2. Regular rate and rhythm. No peripheral edema, cyanosis or pallor.  Gastrointestinal:  Soft, nondistended, epigastric tenderness noted with palpation. No rebound or guarding. Normal bowel sounds. No appreciable masses or hepatomegaly. Rectal:  Not performed.  Neurologic:  Alert and  oriented x4;  grossly normal neurologically.  Skin:   Dry and intact without significant lesions or rashes.  RELEVANT LABS AND IMAGING: CBC    Latest Ref Rng & Units 12/24/2022   12:09 PM 07/17/2021   11:43 AM 02/03/2021    1:21 AM  CBC  WBC 4.0 - 10.5 K/uL 8.7  9.0  10.3   Hemoglobin 12.0 - 15.0 g/dL 60.4  54.0  98.1   Hematocrit 36.0 - 46.0 % 45.8  45.8  41.2   Platelets 150.0 - 400.0 K/uL 271.0  214  224     CMP     Latest Ref Rng & Units 12/24/2022   12:09 PM 08/02/2021    3:18 PM 02/03/2021    1:21 AM  CMP  Glucose 70 - 99 mg/dL 97  88  191   BUN 6 - 23 mg/dL 19  19  21    Creatinine 0.40 - 1.20 mg/dL 4.78  2.95  6.21   Sodium 135 - 145 mEq/L 141  143  142   Potassium 3.5 - 5.1 mEq/L 4.2  4.7   3.8   Chloride 96 - 112 mEq/L 103  105  102   CO2 19 - 32 mEq/L 30  23  25    Calcium 8.4 - 10.5 mg/dL 30.8  9.7  9.4   Total Protein 6.0 - 8.3 g/dL 7.3     Total Bilirubin 0.2 - 1.2 mg/dL 0.7     Alkaline Phos 39 - 117 U/L 65  AST 0 - 37 U/L 16     ALT 0 - 35 U/L 16      Lab Results  Component Value Date   TSH 0.45 12/24/2022     Lab Results  Component Value Date   LIPASE 33.0 12/24/2022    01/10/2023 NM PET IMPRESSION: 1. Persistent hypermetabolic LEFT lower lobe pulmonary nodule. Differential includes bronchogenic carcinoma versus pulmonary infection. Minimal change in imaging characteristics over the interval. Recommend tissue sampling versus repeat CT in 4 weeks to see if lesion reduces in volume. 2. No evidence of metastatic adenopathy in the chest. 3. No evidence of distant metastatic disease. 4.  Aortic Atherosclerosis (ICD10-I70.0).  12/27/2022 [place] Abd RUQ IMPRESSION: No cholelithiasis or gallbladder wall thickening is noted. Positive sonographic Murphy's sign is noted. HIDA scan Totiana Everson be performed for further evaluation. Hepatic steatosis. 08/30/20 NM Gastric emptying  IMPRESSION: Normal gastric emptying study. 07/06/2020 H pylori stool test- positive 10/16/2020 colonoscopy Impression:  - The examined portion of the ileum was normal.  - Four 2 to 6 mm polyps in the sigmoid colon, in the descending colon, in the transverse colon and in the ascending colon, removed with a cold snare. Resected and retrieved.  - Diverticulosis in the entire examined colon.  - The examination was otherwise normal on direct and retroflexion views. Path: Diagnosis Surgical [P], colon, sigmoid, descending, transverse, and ascending, polyp (4) - TUBULAR ADENOMA(S) WITHOUT HIGH-GRADE DYSPLASIA OR MALIGNANCY - HYPERPLASTIC POLYP  07/19/2020 EGD Impression:  - Z- line regular, 36 cm from the incisors.  - Non- obstructing Schatzki ring.  - Small hiatal hernia.  - No other gross lesions  in esophagus.  - Gastritis. Biopsied.  - Duodenal erosions without bleeding.  - Normal second portion of the duodenum. Path: FINAL MICROSCOPIC DIAGNOSIS:  A. STOMACH, ANTRUM AND BODY, BIOPSY:  - Mildly active chronic gastritis.  - Warthin-Starry negative for Helicobacter pylori.  - No intestinal metaplasia, dysplasia or carcinoma.   Assessment: Encounter Diagnoses  Name Primary?   Abdominal pain, epigastric Yes   Bloating    Nausea     72 year old female patient presents with epigastric pain, bloating, and nausea exacerbated with eating. Patient has history of Helicobacter pylori that was treated in 06/2020. No test for eradication on file, will check today given her symptoms. No improvement in symptoms with adding PPI therapy. Completed gastric emptying test 8/22 and negative for similar symptoms. Abd US showed no cholelithiasis or gallbladder wall thickening but positive Murphy sign. HIDA scan recommended for further evaluation. NM PET showed no abnormal hypermetabolic activity in the abd/pelvis. Normal LFT's.   Plan: -Continue Pantoprazole 40mg  po daily -order HIDA scan  -Order Ditherix stool for H pylori  Myisha Pickerel, FNP-C [facility] 01/17/2023, 2:52 PM  Cc: Henson, Vickie L, NP-C

## 2023-01-17 NOTE — Progress Notes (Signed)
Discussed the patient and plan with Deanna May, NP and I am in agreement.

## 2023-01-17 NOTE — Patient Instructions (Signed)
You have been scheduled for a HIDA scan at [facility] (1st floor) on Tuesday 01/28/23. Please arrive 30 minutes prior to your scheduled appointment at  8 am. Make certain not to have anything to eat or drink at least 6 hours prior to your test. Should this appointment date or time not work well for you, please call radiology scheduling at [phone].  _____________________________________________________________________ hepatobiliary (HIDA) scan is an imaging procedure used to diagnose problems in the liver, gallbladder and bile ducts. In the HIDA scan, a radioactive chemical or tracer is injected into a vein in your arm. The tracer is handled by the liver like bile. Bile is a fluid produced and excreted by your liver that helps your digestive system break down fats in the foods you eat. Bile is stored in your gallbladder and the gallbladder releases the bile when you eat a meal. A special nuclear medicine scanner (gamma camera) tracks the flow of the tracer from your liver into your gallbladder and small intestine.  During your HIDA scan  You'll be asked to change into a hospital gown before your HIDA scan begins. Your health care team will position you on a table, usually on your back. The radioactive tracer is then injected into a vein in your arm.The tracer travels through your bloodstream to your liver, where it's taken up by the bile-producing cells. The radioactive tracer travels with the bile from your liver into your gallbladder and through your bile ducts to your small intestine.You may feel some pressure while the radioactive tracer is injected into your vein. As you lie on the table, a special gamma camera is positioned over your abdomen taking pictures of the tracer as it moves through your body. The gamma camera takes pictures continually for about an hour. You'll need to keep still during the HIDA scan. This can become uncomfortable, but you may find that you can lessen the discomfort  by taking deep breaths and thinking about other things. Tell your health care team if you're uncomfortable. The radiologist will watch on a computer the progress of the radioactive tracer through your body. The HIDA scan may be stopped when the radioactive tracer is seen in the gallbladder and enters your small intestine. This typically takes about an hour. In some cases extra imaging will be performed if original images aren't satisfactory, if morphine is given to help visualize the gallbladder or if the medication CCK is given to look at the contraction of the gallbladder. This test typically takes 2 hours to complete. ________________________________________________________________

## 2023-01-20 ENCOUNTER — Other Ambulatory Visit: Payer: Self-pay

## 2023-01-20 ENCOUNTER — Inpatient Hospital Stay: Payer: Medicare HMO | Attending: Internal Medicine | Admitting: Internal Medicine

## 2023-01-20 ENCOUNTER — Inpatient Hospital Stay: Payer: Medicare HMO

## 2023-01-20 VITALS — BP 152/92 | HR 84 | Temp 98.1°F | Resp 17

## 2023-01-20 DIAGNOSIS — I10 Essential (primary) hypertension: Secondary | ICD-10-CM | POA: Insufficient documentation

## 2023-01-20 DIAGNOSIS — D491 Neoplasm of unspecified behavior of respiratory system: Secondary | ICD-10-CM

## 2023-01-20 DIAGNOSIS — Z7901 Long term (current) use of anticoagulants: Secondary | ICD-10-CM | POA: Diagnosis not present

## 2023-01-20 DIAGNOSIS — D6851 Activated protein C resistance: Secondary | ICD-10-CM | POA: Insufficient documentation

## 2023-01-20 DIAGNOSIS — E785 Hyperlipidemia, unspecified: Secondary | ICD-10-CM | POA: Insufficient documentation

## 2023-01-20 DIAGNOSIS — R911 Solitary pulmonary nodule: Secondary | ICD-10-CM | POA: Diagnosis not present

## 2023-01-20 DIAGNOSIS — J439 Emphysema, unspecified: Secondary | ICD-10-CM

## 2023-01-20 DIAGNOSIS — E119 Type 2 diabetes mellitus without complications: Secondary | ICD-10-CM | POA: Diagnosis not present

## 2023-01-20 DIAGNOSIS — I4891 Unspecified atrial fibrillation: Secondary | ICD-10-CM | POA: Diagnosis not present

## 2023-01-20 LAB — CBC WITH DIFFERENTIAL (CANCER CENTER ONLY)
Abs Immature Granulocytes: 0.02 10*3/uL (ref 0.00–0.07)
Basophils Absolute: 0.1 10*3/uL (ref 0.0–0.1)
Basophils Relative: 1 %
Eosinophils Absolute: 0.2 10*3/uL (ref 0.0–0.5)
Eosinophils Relative: 3 %
HCT: 42.5 % (ref 36.0–46.0)
Hemoglobin: 14.3 g/dL (ref 12.0–15.0)
Immature Granulocytes: 0 %
Lymphocytes Relative: 21 %
Lymphs Abs: 1.8 10*3/uL (ref 0.7–4.0)
MCH: 31.2 pg (ref 26.0–34.0)
MCHC: 33.6 g/dL (ref 30.0–36.0)
MCV: 92.6 fL (ref 80.0–100.0)
Monocytes Absolute: 0.6 10*3/uL (ref 0.1–1.0)
Monocytes Relative: 7 %
Neutro Abs: 5.8 10*3/uL (ref 1.7–7.7)
Neutrophils Relative %: 68 %
Platelet Count: 199 10*3/uL (ref 150–400)
RBC: 4.59 MIL/uL (ref 3.87–5.11)
RDW: 12.5 % (ref 11.5–15.5)
WBC Count: 8.5 10*3/uL (ref 4.0–10.5)
nRBC: 0 % (ref 0.0–0.2)

## 2023-01-20 LAB — CMP (CANCER CENTER ONLY)
ALT: 15 U/L (ref 0–44)
AST: 15 U/L (ref 15–41)
Albumin: 4.1 g/dL (ref 3.5–5.0)
Alkaline Phosphatase: 62 U/L (ref 38–126)
Anion gap: 5 (ref 5–15)
BUN: 17 mg/dL (ref 8–23)
CO2: 30 mmol/L (ref 22–32)
Calcium: 9.8 mg/dL (ref 8.9–10.3)
Chloride: 106 mmol/L (ref 98–111)
Creatinine: 1 mg/dL (ref 0.44–1.00)
GFR, Estimated: 59 mL/min — ABNORMAL LOW (ref >60)
Glucose, Bld: 110 mg/dL — ABNORMAL HIGH (ref 70–99)
Potassium: 4.2 mmol/L (ref 3.5–5.1)
Sodium: 141 mmol/L (ref 135–145)
Total Bilirubin: 0.7 mg/dL (ref 0.0–1.2)
Total Protein: 6.7 g/dL (ref 6.5–8.1)

## 2023-01-20 MED ORDER — SPIRIVA RESPIMAT 2.5 MCG/ACT IN AERS: INHALATION_SPRAY | RESPIRATORY_TRACT | 1 refills | Status: DC

## 2023-01-20 MED ORDER — RIVAROXABAN 20 MG PO TABS: 20.0000 mg | ORAL_TABLET | Freq: Every day | ORAL | 0 refills | Status: DC

## 2023-01-20 NOTE — Progress Notes (Signed)
I met the pt today face to face at her consult with Dr.Mohamed. She was accompanied by her sister Kimberly Walters and her daughter Kimberly Walters.  I introduced myself and explained my role as a NN. I provided my business card with contact information and encouraged the pt to call me with any questions.  Pt has a consult with Pulmonology, Dr Sherene Sires, on 1/22, and will follow up with Dr Arbutus Ped 2 weeks later to determine next steps.  Pt and her family denied questions at this time. I escorted the pt to scheduling to make her f/u appt.  I will continue to follow as appropriate.

## 2023-01-20 NOTE — Progress Notes (Signed)
[facility] [phone]   [phone]  CONSULT NOTE  REFERRING PHYSICIAN: Beather Arbour, NP-C  REASON FOR CONSULTATION:  72 years old white female with suspicious lung cancer  HPI Kimberly Walters is a 72 y.o. female with past medical history significant for multiple medical problems including history of diabetes mellitus, heterozygous factor V Leiden, dyslipidemia, hypertension, mitral valve prolapse, paroxysmal atrial fibrillation, history of pulmonary embolism, pyelonephritis as well as COPD and aortic atherosclerosis.  The patient has long history of smoking but quit in June 2021.  She was seen by her primary care provider complaining of shortness of breath for 4 weeks with palpitation.  Chest x-ray on December 24, 2022 showed focal ground glass opacity in the left perihilar region suspicious for focal inflammation or an enlarging ill-defined pulmonary nodule.  CT scan of the chest without contrast was performed on December 27, 2022 and it showed spiculated nodule in the superior segment of the left lower lobe measuring 2.2 x 1.7 cm.  There was also a tiny right upper lobe nodule measuring 0.2 cm, highly suspicious for primary lung neoplasm in the left lower lobe.  The patient had a PET scan on January 10, 2023 and that showed persistent hypermetabolic left lower lobe pulmonary nodule suspicious for bronchogenic carcinoma versus pulmonary infection with minimal change in imaging characteristic over the previous few weeks.  There was no evidence of metastatic adenopathy in the chest and no evidence of distant metastatic disease. The patient was referred to me today for evaluation and recommendation regarding her condition.  Discussed the use of AI scribe software for clinical note transcription with the patient, who gave verbal consent to proceed.  History of Present Illness   The patient, a 72 year old with a history of COPD, Factor V Leiden heterozygous  abnormality, hypertension, hyperlipidemia, diabetes, and atrial fibrillation, presents with a suspicious lung nodule identified on recent imaging. The nodule was discovered during a workup for a suspected gallbladder issue, following a change in primary care providers due to dissatisfaction with inconsistent care.  The patient reports a history of smoking, having started at age 30, with several quit attempts, the most recent cessation being on December 5th, 2024. The patient denies any recent fever or chills but reports occasional chest pain and shortness of breath, particularly when engaging in physical activities such as walking around the yard or shopping. The patient also mentions a cough and occasional pain in the center of the chest or back.  The patient's family history is significant for coronary disease in the father, congestive heart failure, diabetes, thyroid issues, and ovarian cancer in the mother, and stomach cancer in a sibling. 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  The patient's current symptoms and medical history, combined with the recent imaging findings, have led to the current consultation to further investigate the suspicious lung nodule.      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    Gross hematuria    Hyperlipidemia    Hypertension    Mitral valve prolapse    Paroxysmal atrial fibrillation with RVR (HCC)  Pleural effusion on left    Post-thrombotic syndrome of left lower extremity    Primary hypercoagulable state (HCC)    Pulmonary emboli (HCC)    Pyelonephritis    Upper  respiratory infection, viral 04/02/2021    Past Surgical History:  Procedure Laterality Date   BIOPSY  07/19/2020   Procedure: BIOPSY;  Surgeon: Napoleon Form, MD;  Location: [facility];  Service: Endoscopy;;   COLONOSCOPY     ESOPHAGOGASTRODUODENOSCOPY (EGD) WITH PROPOFOL N/A 07/19/2020   Procedure: ESOPHAGOGASTRODUODENOSCOPY (EGD) WITH PROPOFOL;  Surgeon: Napoleon Form, MD;  Location: [facility];  Service: Endoscopy;  Laterality: N/A;   HYSTERECTOMY ABDOMINAL WITH SALPINGO-OOPHORECTOMY  1975   LYMPH NODE BIOPSY Right 1983   Neck    Family History  Problem Relation Age of Onset   Diabetes Mother    Heart disease Mother    Cancer Mother        Uterine   Diabetes Father    CAD Father    Heart attack Father    Hypothyroidism Sister    Melanoma Sister    Clotting disorder Sister    Stomach cancer Sister    Diabetes Sister    Diabetes Sister    Hypertension Sister    Hypothyroidism Sister    Diabetes Sister    Hypertension Sister    Hypothyroidism Sister    Cancer Sister    Cancer Sister    Diabetes Daughter    Diabetes Son    Diabetes Son    Breast cancer Half-Sister    Pancreatic cancer Nephew    Uterine cancer Niece    Ovarian cancer Other    Uterine cancer Other    Breast cancer Other    Colon cancer Neg Hx    Esophageal cancer Neg Hx     Social History Social History   Tobacco Use   Smoking status: Former    Current packs/day: 0.00    Average packs/day: 0.5 packs/day for 55.0 years (27.5 ttl pk-yrs)    Types: Cigarettes    Quit date: 07/01/2019    Years since quitting: 3.5   Smokeless tobacco: Never   Tobacco comments:    1-2 per day Stopped 02/01/2021  Vaping Use   Vaping status: Never Used  Substance Use Topics   Alcohol use: No   Drug use: No    No Known Allergies  Current Outpatient Medications  Medication Sig Dispense Refill   acetaminophen (TYLENOL) 500 MG tablet Take 500 mg by mouth every 6 (six) hours as needed for  moderate pain.     albuterol (VENTOLIN HFA) 108 (90 Base) MCG/ACT inhaler Inhale 2 puffs into the lungs every 6 (six) hours as needed for wheezing or shortness of breath. 8 g 0   alendronate (FOSAMAX) 70 MG tablet Take 1 tablet (70 mg total) by mouth once a week. Take with a full glass of water on an empty stomach. 12 tablet 2   atorvastatin (LIPITOR) 20 MG tablet Take 1 tablet (20 mg total) by mouth daily. 90 tablet 3   cholecalciferol (VITAMIN D3) 25 MCG (1000 UNIT) tablet Take 1 tablet (1,000 Units total) by mouth daily. 90 tablet 0   lisinopril (ZESTRIL) 20 MG tablet Take 1 tablet (20 mg total) by mouth daily. 90 tablet 3   pantoprazole (PROTONIX) 40 MG tablet Take 1 tablet (40 mg total) by mouth daily. 30 tablet 1   rivaroxaban (XARELTO) 20 MG TABS tablet Take 1 tablet (20 mg total) by mouth daily with supper.  90 tablet 0   SYMBICORT 80-4.5 MCG/ACT inhaler Inhale 2 puffs by mouth twice daily 11 g 0   Tiotropium Bromide Monohydrate (SPIRIVA RESPIMAT) 2.5 MCG/ACT AERS INHALE 2 SPRAY(S) BY MOUTH ONCE DAILY AT 8 PM 4 g 1   No current facility-administered medications for this visit.    Review of Systems  Constitutional: positive for fatigue Eyes: negative Ears, nose, mouth, throat, and face: negative Respiratory: positive for cough and dyspnea on exertion Cardiovascular: negative Gastrointestinal: negative Genitourinary:negative Integument/breast: negative Hematologic/lymphatic: negative Musculoskeletal:negative Neurological: negative Behavioral/Psych: negative Endocrine: negative Allergic/Immunologic: negative  Physical Exam  ZOX:WRUEA, healthy, no distress, well nourished, well developed, and anxious SKIN: skin color, texture, turgor are normal, no rashes or significant lesions HEAD: Normocephalic, No masses, lesions, tenderness or abnormalities EYES: normal, PERRLA, Conjunctiva are pink and non-injected EARS: External ears normal, Canals clear OROPHARYNX:no exudate, no  erythema, and lips, buccal mucosa, and tongue normal  NECK: supple, no adenopathy, no JVD LYMPH:  no palpable lymphadenopathy, no hepatosplenomegaly BREAST:not examined LUNGS: clear to auscultation , and palpation HEART: regular rate & rhythm, no murmurs, and no gallops ABDOMEN:abdomen soft, non-tender, normal bowel sounds, and no masses or organomegaly BACK: Back symmetric, no curvature., No CVA tenderness EXTREMITIES:no joint deformities, effusion, or inflammation, no edema  NEURO: alert & oriented x 3 with fluent speech, no focal motor/sensory deficits  PERFORMANCE STATUS: ECOG 1  LABORATORY DATA: Lab Results  Component Value Date   WBC 8.7 12/24/2022   HGB 15.2 (H) 12/24/2022   HCT 45.8 12/24/2022   MCV 95.9 12/24/2022   PLT 271.0 12/24/2022      Chemistry      Component Value Date/Time   NA 141 12/24/2022 1209   NA 143 08/02/2021 1518   K 4.2 12/24/2022 1209   CL 103 12/24/2022 1209   CO2 30 12/24/2022 1209   BUN 19 12/24/2022 1209   BUN 19 08/02/2021 1518   CREATININE 0.97 12/24/2022 1209      Component Value Date/Time   CALCIUM 10.0 12/24/2022 1209   ALKPHOS 65 12/24/2022 1209   AST 16 12/24/2022 1209   ALT 16 12/24/2022 1209   BILITOT 0.7 12/24/2022 1209   BILITOT 0.3 11/16/2018 1403       RADIOGRAPHIC STUDIES: NM PET Image Initial (PI) Skull Base To Thigh (F-18 FDG) Result Date: 01/10/2023 CLINICAL DATA:  Subsequent treatment strategy for suspicious pulmonary nodule. EXAM: NUCLEAR MEDICINE PET SKULL BASE TO THIGH TECHNIQUE: 8.6 mCi F-18 FDG was injected intravenously. Full-ring PET imaging was performed from the skull base to thigh after the radiotracer. CT data was obtained and used for attenuation correction and anatomic localization. Fasting blood glucose: 112 mg/dl COMPARISON:  CT 54/09/8117 FINDINGS: NECK: No hypermetabolic lymph nodes in the neck. Incidental CT findings: None. CHEST: Within the LEFT lower lobe, 18 mm spiculated nodule has intense  metabolic activity with SUV max equal 8.0 on image 71. Lesion appears slightly decreased in size from 20 mm on prior the differing technique. No additional pulmonary nodules. No hypermetabolic mediastinal nodes. No hypermetabolic supraclavicular nodes. Incidental CT findings: None. ABDOMEN/PELVIS: No abnormal hypermetabolic activity within the liver, pancreas, adrenal glands, or spleen. No hypermetabolic lymph nodes in the abdomen or pelvis. Several hypermetabolic foci within the subcutaneous tissue of the lower abdomen. Favor inflammation related to hypodermic injection sites. Incidental CT findings: Atherosclerotic calcification of the aorta. Large benign RIGHT renal cyst. SKELETON: No focal hypermetabolic activity to suggest skeletal metastasis. Incidental CT findings: None. IMPRESSION: 1. Persistent hypermetabolic LEFT lower lobe pulmonary  nodule. Differential includes bronchogenic carcinoma versus pulmonary infection. Minimal change in imaging characteristics over the interval. Recommend tissue sampling versus repeat CT in 4 weeks to see if lesion reduces in volume. 2. No evidence of metastatic adenopathy in the chest. 3. No evidence of distant metastatic disease. 4.  Aortic Atherosclerosis (ICD10-I70.0). Electronically Signed   By: Genevive Bi M.D.   On: 01/10/2023 12:14   US Abdomen Limited RUQ (LIVER/GB) Result Date: 12/27/2022 CLINICAL DATA:  Postprandial right upper quadrant abdominal pain for 3 months. EXAM: ULTRASOUND ABDOMEN LIMITED RIGHT UPPER QUADRANT COMPARISON:  July 18, 2020. FINDINGS: Gallbladder: No gallstones or wall thickening visualized. Positive sonographic Murphy sign noted by sonographer. Common bile duct: Diameter: 3 mm which is within normal limits. Liver: No focal lesion identified. Increased echogenicity of hepatic parenchyma consistent with hepatic steatosis. Portal vein is patent on color Doppler imaging with normal direction of blood flow towards the liver. Other: None.  IMPRESSION: No cholelithiasis or gallbladder wall thickening is noted. Positive sonographic Murphy's sign is noted. HIDA scan may be performed for further evaluation. Hepatic steatosis. Electronically Signed   By: Lupita Raider M.D.   On: 12/27/2022 10:05   CT CHEST WO CONTRAST Result Date: 12/27/2022 CLINICAL DATA:  Abnormal chest x-ray. History of smoking. Fall lung nodule. * Tracking Code: BO * EXAM: CT CHEST WITHOUT CONTRAST TECHNIQUE: Multidetector CT imaging of the chest was performed following the standard protocol without IV contrast. RADIATION DOSE REDUCTION: This exam was performed according to the departmental dose-optimization program which includes automated exposure control, adjustment of the mA and/or kV according to patient size and/or use of iterative reconstruction technique. COMPARISON:  Chest radiograph 12/24/2022 FINDINGS: Cardiovascular: Coronary artery calcification and aortic atherosclerotic calcification. Mediastinum/Nodes: No axillary or supraclavicular adenopathy. No mediastinal or hilar adenopathy. No pericardial fluid. Esophagus normal. Lungs/Pleura: Pulmonary density identified on chest radiograph corresponds to a spiculated nodule in the superior segment of the LEFT lower lobe measuring 22 by 17 mm (image 60/ series 5). Tiny RIGHT lobe nodule measuring 2 mm on image 67/5. Mild centrilobular emphysema the upper lobes. Benign linear scarring at the LEFT lung base. Upper Abdomen: Limited view of the liver, kidneys, pancreas are unremarkable. Normal adrenal glands. Musculoskeletal: No aggressive osseous lesion. IMPRESSION: 1. Spiculated nodule in the superior segment of the LEFT lower lobe is highly concerning for primary lung neoplasm. Recommend FDG PET scan for staging. If multidisciplinary follow up management is desired, this is available in the [facility] through the [facility] [phone]. 2. No evidence of metastatic adenopathy. 3. Tiny RIGHT  lower lobe nodule is indeterminate. 4. Aortic Atherosclerosis (ICD10-I70.0) and Emphysema (ICD10-J43.9). These results will be called to the ordering clinician or representative by the Radiologist Assistant, and communication documented in the PACS or [employer]. Electronically Signed   By: Genevive Bi M.D.   On: 12/27/2022 09:40   DG Chest 2 View Result Date: 12/24/2022 CLINICAL DATA:  Dyspnea on exertion for 4 weeks. Palpitations with hypoxemia. History of COPD. EXAM: CHEST - 2 VIEW COMPARISON:  Radiographs 02/01/2021 and 07/18/2020.  CT 07/18/2020. FINDINGS: The heart size and mediastinal contours are stable. Grossly stable scarring at both lung bases. Focal ground-glass opacity in the left perihilar region could reflect focal inflammation or an enlarging ill-defined pulmonary nodule. No confluent airspace disease, significant pleural effusion or pneumothorax. Stable mild degenerative changes in the spine with a grossly stable lower thoracic compression deformity. No acute osseous findings are seen. IMPRESSION: 1. Focal ground-glass opacity in  the left perihilar region could reflect focal inflammation or an enlarging ill-defined pulmonary nodule. Recommend further evaluation with chest CT. 2. No other evidence of acute cardiopulmonary process. Electronically Signed   By: Carey Bullocks M.D.   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  PLAN: I had a lengthy discussion with the patient and her family today about her current condition and possible treatment options.  I personally and independently reviewed the scan images and discussed the result and showed the images to the patient and her family today.    Suspicious Lung Nodule A 1.8 cm hypermetabolic nodule in the left lower lobe with an  SUV max of 8.0 was identified on PET scan, suspicious for malignancy. The patient has COPD and a significant smoking history, but quit on December 26, 2022. Symptoms include occasional shortness of breath and chest pain. Discussed monitoring with a follow-up scan in three months or proceeding with a biopsy. If malignancy is confirmed, potential surgical resection pending pulmonary function tests was discussed. Early-stage lung cancer has a high chance for cure. - Refer to pulmonologist for potential biopsy - Follow up with Dr. Sherene Sires on February 12, 2023 - Schedule follow-up with oncologist 2-3 weeks post-pulmonologist evaluation - Consider cardiothoracic surgery if biopsy confirms malignancy - Evaluate pulmonary function for surgical candidacy if needed  Chronic Obstructive Pulmonary Disease (COPD) Managed with inhalers, improved oxygen levels, no recent exacerbations. - Continue current inhaler regimen - Monitor for exacerbations or changes in respiratory status  Factor V Leiden Mutation Heterozygous Factor V Leiden mutation with a history of blood clots, managed by Dr. Truett Perna on blood thinners. - Continue management with Dr. Truett Perna - Monitor for signs of thrombosis or bleeding  Atrial Fibrillation Managed with anticoagulation therapy. - Continue current anticoagulation therapy - Monitor for arrhythmia symptoms  Hypertension Managed with antihypertensive medications. - Continue current antihypertensive medications - Monitor blood pressure regularly  Hyperlipidemia Managed with lipid-lowering therapy. - Continue current lipid-lowering therapy - Monitor lipid levels regularly  Diabetes Mellitus Recent increase in A1c due to dietary changes and increased fast food consumption. Lives with two diabetic sons and plans to improve diet. - Implement dietary changes to improve glycemic control - Monitor blood glucose levels regularly - Follow up with primary care provider for diabetes  management  General Health Maintenance Recently quit smoking, no recent alcohol or drug use, no known medication allergies, family history of cancers and cardiovascular diseases. - Encourage continued smoking cessation - Maintain regular follow-up appointments for chronic conditions - Monitor for new symptoms or changes in health status  Follow-up - Follow up with Dr. Wyn Quaker on February 12, 2023 - Schedule follow-up with oncologist 2-3 weeks post-pulmonologist evaluation - Continue regular follow-ups with Dr. Truett Perna for Factor V Leiden management.   The patient was advised to call immediately if she has any other concerning symptoms in the interval. The patient voices understanding of current disease status and treatment options and is in agreement with the current care plan.  All questions were answered. The patient knows to call the clinic with any problems, questions or concerns. We can certainly see the patient much sooner if necessary.  Thank you so much for allowing me to participate in the care of Kimberly Walters. I will continue to follow up the patient with  you and assist in her care.  The total time spent in the appointment was 60 minutes.  Disclaimer: This note was dictated with voice recognition software. Similar sounding words can inadvertently be transcribed and may not be corrected upon review.   Lajuana Matte January 20, 2023, 1:32 PM

## 2023-01-20 NOTE — Telephone Encounter (Signed)
Fax received to refill rx, rx sent

## 2023-01-23 ENCOUNTER — Telehealth: Payer: Self-pay | Admitting: *Deleted

## 2023-01-23 ENCOUNTER — Other Ambulatory Visit: Payer: Self-pay | Admitting: *Deleted

## 2023-01-23 DIAGNOSIS — Z8619 Personal history of other infectious and parasitic diseases: Secondary | ICD-10-CM

## 2023-01-23 NOTE — Telephone Encounter (Signed)
 Patient informed she can pick up H. Pylori test. Test placed up front for her.

## 2023-01-23 NOTE — Telephone Encounter (Signed)
-----   Message from [facility] V sent at 01/17/2023  3:34 PM EST ----- Regarding: h. pylori test Do u have H. Pylori test?   Ask Verlon Au

## 2023-01-28 ENCOUNTER — Ambulatory Visit: Payer: Medicare HMO | Admitting: Gastroenterology

## 2023-01-28 ENCOUNTER — Encounter (HOSPITAL_COMMUNITY)
Admission: RE | Admit: 2023-01-28 | Discharge: 2023-01-28 | Disposition: A | Discharge status: Home / Self Care | Payer: Medicare HMO | Source: Ambulatory Visit | Attending: Gastroenterology | Admitting: Gastroenterology

## 2023-01-28 DIAGNOSIS — R110 Nausea: Secondary | ICD-10-CM | POA: Insufficient documentation

## 2023-01-28 DIAGNOSIS — R1013 Epigastric pain: Secondary | ICD-10-CM | POA: Diagnosis not present

## 2023-01-28 DIAGNOSIS — R140 Abdominal distension (gaseous): Secondary | ICD-10-CM | POA: Diagnosis not present

## 2023-01-28 MED ORDER — TECHNETIUM TC 99M MEBROFENIN IV KIT
5.5000 | PACK | Freq: Once | INTRAVENOUS | Status: AC
  Administered 2023-01-28: 5.5 via INTRAVENOUS

## 2023-01-29 ENCOUNTER — Ambulatory Visit: Payer: Medicare HMO

## 2023-01-29 VITALS — Ht 64.0 in | Wt 173.0 lb

## 2023-01-29 DIAGNOSIS — Z0000 Encounter for general adult medical examination without abnormal findings: Secondary | ICD-10-CM

## 2023-01-29 DIAGNOSIS — E119 Type 2 diabetes mellitus without complications: Secondary | ICD-10-CM | POA: Diagnosis not present

## 2023-01-29 NOTE — Progress Notes (Addendum)
 Subjective:   Kimberly Walters is a 73 y.o. female who presents for Medicare Annual (Subsequent) preventive examination.  Visit Complete: Virtual I connected with  Emaan L Knepp on 01/29/23 by a audio enabled telemedicine application and verified that I am speaking with the correct person using two identifiers.  Patient Location: Home  Provider Location: Office/Clinic  I discussed the limitations of evaluation and management by telemedicine. The patient expressed understanding and agreed to proceed.  Vital Signs: Because this visit was a virtual/telehealth visit, some criteria may be missing or patient reported. Any vitals not documented were not able to be obtained and vitals that have been documented are patient reported.   Cardiac Risk Factors include: advanced age (>52men, >54 women);Other (see comment);hypertension, Risk factor comments: A-Fib, COPD, Aortic atherosclerosis,     Objective:    Today's Vitals   01/29/23 0859  Weight: 173 lb (78.5 kg)  Height: 5' 4 (1.626 m)   Body mass index is 29.7 kg/m.     01/29/2023    9:34 AM 07/24/2022   11:14 AM 03/28/2022   10:57 AM 03/28/2022   10:56 AM 03/28/2022    8:56 AM 01/04/2022    9:25 AM 10/18/2021   10:39 AM  Advanced Directives  Does Patient Have a Medical Advance Directive? Yes No No No No No Yes  Type of Estate Agent of [place];Living will      Living will;Healthcare Power of Attorney  Does patient want to make changes to medical advance directive?       No - Patient declined  Copy of Healthcare Power of Attorney in Chart? No - copy requested      No - copy requested  Would patient like information on creating a medical advance directive?   No - Patient declined No - Patient declined No - Patient declined No - Patient declined No - Patient declined    Current Medications (verified) Outpatient Encounter Medications as of 01/29/2023  Medication Sig   acetaminophen  (TYLENOL ) 500 MG tablet Take 500 mg by  mouth every 6 (six) hours as needed for moderate pain.   albuterol  (VENTOLIN  HFA) 108 (90 Base) MCG/ACT inhaler Inhale 2 puffs into the lungs every 6 (six) hours as needed for wheezing or shortness of breath.   alendronate  (FOSAMAX ) 70 MG tablet Take 1 tablet (70 mg total) by mouth once a week. Take with a full glass of water  on an empty stomach.   atorvastatin  (LIPITOR) 20 MG tablet Take 1 tablet (20 mg total) by mouth daily.   cholecalciferol (VITAMIN D3) 25 MCG (1000 UNIT) tablet Take 1 tablet (1,000 Units total) by mouth daily.   lisinopril  (ZESTRIL ) 20 MG tablet Take 1 tablet (20 mg total) by mouth daily.   pantoprazole  (PROTONIX ) 40 MG tablet Take 1 tablet (40 mg total) by mouth daily.   rivaroxaban  (XARELTO ) 20 MG TABS tablet Take 1 tablet (20 mg total) by mouth daily with supper.   SYMBICORT  80-4.5 MCG/ACT inhaler Inhale 2 puffs by mouth twice daily   Tiotropium Bromide Monohydrate  (SPIRIVA  RESPIMAT) 2.5 MCG/ACT AERS INHALE 2 SPRAY(S) BY MOUTH ONCE DAILY AT 8 PM   No facility-administered encounter medications on file as of 01/29/2023.    Allergies (verified) Patient has no known allergies.   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    Gross hematuria    Hyperlipidemia    Hypertension    Mitral valve prolapse    Paroxysmal atrial fibrillation with RVR (HCC)    Pleural effusion on left    Post-thrombotic syndrome of left lower extremity    Primary hypercoagulable state (HCC)    Pulmonary emboli (HCC)    Pyelonephritis    Upper respiratory infection, viral 04/02/2021   Past Surgical History:  Procedure Laterality Date   BIOPSY  07/19/2020   Procedure: BIOPSY;  Surgeon: Shila Gustav GAILS, MD;  Location: [facility];  Service:  Endoscopy;;   COLONOSCOPY     ESOPHAGOGASTRODUODENOSCOPY (EGD) WITH PROPOFOL  N/A 07/19/2020   Procedure: ESOPHAGOGASTRODUODENOSCOPY (EGD) WITH PROPOFOL ;  Surgeon: Shila Gustav GAILS, MD;  Location: [facility];  Service: Endoscopy;  Laterality: N/A;   HYSTERECTOMY ABDOMINAL WITH SALPINGO-OOPHORECTOMY  1975   LYMPH NODE BIOPSY Right 1983   Neck   Family History  Problem Relation Age of Onset   Diabetes Mother    Heart disease Mother    Cancer Mother        Uterine   Diabetes Father    CAD Father    Heart attack Father    Hypothyroidism Sister    Melanoma Sister    Clotting disorder Sister    Stomach cancer Sister    Diabetes Sister    Diabetes Sister    Hypertension Sister    Hypothyroidism Sister    Diabetes Sister    Hypertension Sister    Hypothyroidism Sister    Cancer Sister    Cancer Sister    Diabetes Daughter    Diabetes Son    Diabetes Son    Breast cancer Half-Sister    Pancreatic cancer Nephew    Uterine cancer Niece    Ovarian cancer Other    Uterine cancer Other    Breast cancer Other    Colon cancer Neg Hx    Esophageal cancer Neg Hx    Social History   Socioeconomic History   Marital status: Widowed    Spouse name: Not on file   Number of children: 3   Years of education: Not on file   Highest education level: GED or equivalent  Occupational History   Occupation: Retired    Associate Professor: [employer]  Tobacco Use   Smoking status: Former    Current packs/day: 0.00    Average packs/day: 0.5 packs/day for 55.0 years (27.5 ttl pk-yrs)    Types: Cigarettes    Quit date: 07/01/2019    Years since quitting: 3.5   Smokeless tobacco: Never   Tobacco comments:    1-2 per day Stopped 02/01/2021  Vaping Use   Vaping status: Never Used  Substance and Sexual Activity   Alcohol use: No   Drug use: No   Sexual activity: Yes    Birth control/protection: Surgical, Post-menopausal  Other Topics Concern   Not on file  Social History Narrative   Current Social  History 09/12/2020      Patient lives with 2 sons and granddaughter in a one story home. There are 3 steps with handrail up to the entrance the patient uses.       Patient's method of transportation is personal car.      The highest level of education was GED      The patient currently retired from [employer].      Identified important Relationships are my children, my grandchildren, my great-grandchildren,  and my sisters      Pets : dog and Brewing Technologist / Fun: read, watch movies, go walking, and just do things with the children.      Current Stressors: dog      Religious / Personal Beliefs: Old fashioned [employer].       Pt lives with her 2 sons and granddaughter-2025            Social Drivers of Health   Financial Resource Strain: Medium Risk (01/29/2023)   Overall Financial Resource Strain (CARDIA)    Difficulty of Paying Living Expenses: Somewhat hard  Food Insecurity: No Food Insecurity (01/29/2023)   Hunger Vital Sign    Worried About Running Out of Food in the Last Year: Never true    Ran Out of Food in the Last Year: Never true  Transportation Needs: No Transportation Needs (01/29/2023)   PRAPARE - Administrator, Civil Service (Medical): No    Lack of Transportation (Non-Medical): No  Physical Activity: Inactive (01/29/2023)   Exercise Vital Sign    Days of Exercise per Week: 0 days    Minutes of Exercise per Session: 0 min  Stress: No Stress Concern Present (01/29/2023)   [employer] of Occupational Health - Occupational Stress Questionnaire    Feeling of Stress : Not at all  Social Connections: Moderately Isolated (01/29/2023)   Social Connection and Isolation Panel [NHANES]    Frequency of Communication with Friends and Family: More than three times a week    Frequency of Social Gatherings with Friends and Family: Three times a week    Attends Religious Services: 1 to 4 times per year    Active Member of Clubs or Organizations: No     Attends Banker Meetings: Never    Marital Status: Widowed    Tobacco Counseling Counseling given: Not Answered Tobacco comments: 1-2 per day Stopped 02/01/2021   Clinical Intake:  Pre-visit preparation completed: Yes  Pain : No/denies pain     BMI - recorded: 29.7 Nutritional Status: BMI 25 -29 Overweight Nutritional Risks: None Diabetes: No  How often do you need to have someone help you when you read instructions, pamphlets, or other written materials from your doctor or pharmacy?: 1 - Never  Interpreter Needed?: No  Information entered by :: Jennifer Holland, RMA   Activities of Daily Living    01/29/2023    9:01 AM 03/28/2022   10:57 AM  In your present state of health, do you have any difficulty performing the following activities:  Hearing? 0 0  Vision? 0 0  Difficulty concentrating or making decisions? 0 0  Walking or climbing stairs? 0 1  Dressing or bathing? 0 0  Doing errands, shopping? 0 0  Preparing Food and eating ? N   Using the Toilet? N   In the past six months, have you accidently leaked urine? N   Do you have problems with loss of bowel control? N   Managing your Medications? N   Managing your Finances? N   Housekeeping or managing your Housekeeping? N     Patient Care Team: [name], NP-C as PCP - General (Family Medicine) Amadeo Windell SAILOR, MD (Inactive) as Consulting Physician (Oncology) Legrand Victory CROME MOULD, MD as Consulting Physician (Gastroenterology) Prentis Duwaine BROCKS, RN as Oncology Nurse Navigator [place], [place], OD (Optometry)  Indicate any recent Medical Services you may have received from other than [facility] providers in the  past year (date may be approximate).     Assessment:   This is a routine wellness examination for Trina.  Hearing/Vision screen Hearing Screening - Comments:: Denies hearing difficulties   Vision Screening - Comments:: Wears eyeglasses   Goals Addressed               This Visit's Progress      Patient Stated (pt-stated)        Lose some weight      Depression Screen    01/29/2023    9:40 AM 03/28/2022   10:56 AM 03/28/2022    9:39 AM 01/04/2022    9:26 AM 08/22/2021   10:37 AM 08/02/2021    2:27 PM 07/25/2021    1:21 PM  PHQ 2/9 Scores  PHQ - 2 Score 3 0 0 0 0 0 0  PHQ- 9 Score 8 3 3  0       Fall Risk    01/29/2023    9:35 AM 03/28/2022   10:56 AM 03/28/2022   10:06 AM 01/04/2022    9:25 AM 08/22/2021   10:37 AM  Fall Risk   Falls in the past year? 0 0 0 0 0  Number falls in past yr:  0 0  0  Injury with Fall?  0 0  0  Risk for fall due to :  No Fall Risks No Fall Risks No Fall Risks No Fall Risks  Follow up Falls evaluation completed;Falls prevention discussed Falls evaluation completed;Falls prevention discussed Falls evaluation completed Falls evaluation completed Falls prevention discussed;Falls evaluation completed    MEDICARE RISK AT HOME: Medicare Risk at Home Any stairs in or around the home?: Yes (outside) If so, are there any without handrails?: Yes Home free of loose throw rugs in walkways, pet beds, electrical cords, etc?: Yes Adequate lighting in your home to reduce risk of falls?: Yes Life alert?: No Use of a cane, walker or w/c?: No Grab bars in the bathroom?: No Shower chair or bench in shower?: No Elevated toilet seat or a handicapped toilet?: No  TIMED UP AND GO:  Was the test performed?  No    Cognitive Function:        01/29/2023    9:02 AM 03/28/2022   10:57 AM 09/12/2020    2:40 PM  6CIT Screen  What Year? 0 points 0 points 0 points  What month? 0 points 0 points 0 points  What time? 0 points 0 points 0 points  Count back from 20 0 points 0 points 0 points  Months in reverse 0 points 0 points 0 points  Repeat phrase 0 points 0 points 0 points  Total Score 0 points 0 points 0 points    Immunizations Immunization History  Administered Date(s) Administered   Fluad Quad(high Dose 65+) 11/27/2022   Influenza,inj,Quad PF,6+ Mos  11/23/2018   Influenza-Unspecified 10/23/2021   Moderna Covid-19 Vaccine Bivalent Booster 84yrs & up 11/12/2021   PFIZER(Purple Top)SARS-COV-2 Vaccination 02/27/2019, 03/20/2019, 12/29/2019   PNEUMOCOCCAL CONJUGATE-20 01/04/2022   Pfizer(Comirnaty)Fall Seasonal Vaccine 12 years and older 11/27/2022   Pneumococcal Polysaccharide-23 07/15/2019   Respiratory Syncytial Virus Vaccine,Recomb Aduvanted(Arexvy) 10/23/2021   Tdap 08/02/2021    TDAP status: Up to date  Flu Vaccine status: Up to date  Pneumococcal vaccine status: Up to date  Covid-19 vaccine status: Completed vaccines  Qualifies for Shingles Vaccine? Yes   Zostavax completed Yes   Shingrix Completed?: Yes  Screening Tests Health Maintenance  Topic Date Due   OPHTHALMOLOGY EXAM  Never done   Diabetic kidney evaluation - Urine ACR  11/16/2019   FOOT EXAM  02/05/2023 (Originally 11/22/1960)   Zoster Vaccines- Shingrix (1 of 2) 05/21/2023 (Originally 11/22/1999)   MAMMOGRAM  05/23/2023   HEMOGLOBIN A1C  06/24/2023   Lung Cancer Screening  12/27/2023   Diabetic kidney evaluation - eGFR measurement  01/20/2024   Medicare Annual Wellness (AWV)  01/29/2024   Colonoscopy  10/16/2025   DTaP/Tdap/Td (2 - Td or Tdap) 08/03/2031   Pneumonia Vaccine 52+ Years old  Completed   INFLUENZA VACCINE  Completed   DEXA SCAN  Completed   COVID-19 Vaccine  Completed   Hepatitis C Screening  Completed   HPV VACCINES  Aged Out   COLON CANCER SCREENING ANNUAL FOBT  Discontinued    Health Maintenance  Health Maintenance Due  Topic Date Due   OPHTHALMOLOGY EXAM  Never done   Diabetic kidney evaluation - Urine ACR  11/16/2019    Colorectal cancer screening: Type of screening: Colonoscopy. Completed 10/16/2020. Repeat every 5 years  Mammogram status: Completed 05/22/2021. Repeat every year  Bone Density status: Completed 02/03/2018. Results reflect: Bone density results: OSTEOPOROSIS. Repeat every 2 years.  Lung Cancer Screening: (Low  Dose CT Chest recommended if Age 29-80 years, 20 pack-year currently smoking OR have quit w/in 15years.) does qualify.   Lung Cancer Screening Referral: Had one on 12/24/2022  Additional Screening:  Hepatitis C Screening: does qualify; Completed 10/18/2021  Vision Screening: Recommended annual ophthalmology exams for early detection of glaucoma and other disorders of the eye. Is the patient up to date with their annual eye exam?  Yes  Who is the provider or what is the name of the office in which the patient attends annual eye exams? [facility] If pt is not established with a provider, would they like to be referred to a provider to establish care? No .   Dental Screening: Recommended annual dental exams for proper oral hygiene   Community Resource Referral / Chronic Care Management: CRR required this visit?  No   CCM required this visit?  No     Plan:     I have personally reviewed and noted the following in the patient's chart:   Medical and social history Use of alcohol, tobacco or illicit drugs  Current medications and supplements including opioid prescriptions. Patient is not currently taking opioid prescriptions. Functional ability and status Nutritional status Physical activity Advanced directives List of other physicians Hospitalizations, surgeries, and ER visits in previous 12 months Vitals Screenings to include cognitive, depression, and falls Referrals and appointments  In addition, I have reviewed and discussed with patient certain preventive protocols, quality metrics, and best practice recommendations. A written personalized care plan for preventive services as well as general preventive health recommendations were provided to patient.     Claudett Bayly L Kush [place], CMA   01/29/2023   After Visit Summary: (Mail) Due to this being a telephonic visit, the after visit summary with patients personalized plan was offered to patient via mail   Nurse Notes: Patient is  due for a Shingrix vaccine.  She is also due for an UACR and a foot exam.  Patient stated that she has had an eye exam this past year in May with [facility].  I have sent for record today.  She is due for a DEXA, however, patient stated that she would like to hold off on the order for now.  Patient had no other concerns to address today.

## 2023-01-29 NOTE — Patient Instructions (Signed)
 Kimberly Walters , Thank you for taking time to come for your Medicare Wellness Visit. I appreciate your ongoing commitment to your health goals. Please review the following plan we discussed and let me know if I can assist you in the future.   Referrals/Orders/Follow-Ups/Clinician Recommendations: You are due for a Shingles vaccine, and a foot exam.  You are also due for a Bone Density screening.  Remember to let us  know when you are ready to get that order placed. It was a pleasure talking with you today and keep up the good work.    This is a list of the screening recommended for you and due dates:  Health Maintenance  Topic Date Due   Eye exam for diabetics  Never done   Yearly kidney health urinalysis for diabetes  11/16/2019   Complete foot exam   02/05/2023*   Zoster (Shingles) Vaccine (1 of 2) 05/21/2023*   Mammogram  05/23/2023   Hemoglobin A1C  06/24/2023   Screening for Lung Cancer  12/27/2023   Yearly kidney function blood test for diabetes  01/20/2024   Medicare Annual Wellness Visit  01/29/2024   Colon Cancer Screening  10/16/2025   DTaP/Tdap/Td vaccine (2 - Td or Tdap) 08/03/2031   Pneumonia Vaccine  Completed   Flu Shot  Completed   DEXA scan (bone density measurement)  Completed   COVID-19 Vaccine  Completed   Hepatitis C Screening  Completed   HPV Vaccine  Aged Out   Stool Blood Test  Discontinued  *Topic was postponed. The date shown is not the original due date.    Advanced directives: (Copy Requested) Please bring a copy of your health care power of attorney and living will to the office to be added to your chart at your convenience.  Next Medicare Annual Wellness Visit scheduled for next year: Yes

## 2023-01-31 ENCOUNTER — Other Ambulatory Visit: Payer: Self-pay

## 2023-01-31 MED ORDER — DILTIAZEM HCL ER 120 MG PO CP24: 120.0000 mg | ORAL_CAPSULE | Freq: Every day | ORAL | 3 refills | Status: DC

## 2023-02-03 ENCOUNTER — Other Ambulatory Visit: Payer: Medicare HMO

## 2023-02-03 DIAGNOSIS — R110 Nausea: Secondary | ICD-10-CM

## 2023-02-03 DIAGNOSIS — Z8619 Personal history of other infectious and parasitic diseases: Secondary | ICD-10-CM

## 2023-02-03 DIAGNOSIS — R140 Abdominal distension (gaseous): Secondary | ICD-10-CM | POA: Diagnosis not present

## 2023-02-03 DIAGNOSIS — R1013 Epigastric pain: Secondary | ICD-10-CM

## 2023-02-05 ENCOUNTER — Ambulatory Visit: Payer: Medicare HMO | Admitting: Family Medicine

## 2023-02-06 ENCOUNTER — Other Ambulatory Visit: Payer: Self-pay | Admitting: Family Medicine

## 2023-02-07 ENCOUNTER — Ambulatory Visit (INDEPENDENT_AMBULATORY_CARE_PROVIDER_SITE_OTHER): Payer: Medicare HMO | Admitting: Family Medicine

## 2023-02-07 ENCOUNTER — Encounter: Payer: Self-pay | Admitting: Family Medicine

## 2023-02-07 VITALS — BP 144/90 | HR 78 | Temp 97.7°F | Ht 64.0 in | Wt 173.0 lb

## 2023-02-07 DIAGNOSIS — I10 Essential (primary) hypertension: Secondary | ICD-10-CM | POA: Diagnosis not present

## 2023-02-07 DIAGNOSIS — I700 Atherosclerosis of aorta: Secondary | ICD-10-CM

## 2023-02-07 DIAGNOSIS — J439 Emphysema, unspecified: Secondary | ICD-10-CM

## 2023-02-07 DIAGNOSIS — E119 Type 2 diabetes mellitus without complications: Secondary | ICD-10-CM

## 2023-02-07 NOTE — Assessment & Plan Note (Signed)
Not controlled. Increase lisinopril to 40 mg. Low sodium diet.

## 2023-02-07 NOTE — Assessment & Plan Note (Signed)
Breathing improved since using Symbicort bid.  Follow up with pulmonologist and oncologist.

## 2023-02-07 NOTE — Progress Notes (Signed)
Subjective:     Patient ID: Kimberly Walters, female    DOB: May 13, 1950, 73 y.o.   MRN: [id number]  Chief Complaint  Patient presents with   Medical Management of Chronic Issues    4 week f/u  Bad bloating after eating    HPI   History of Present Illness         She is here to discuss new diabetes diagnosis. Previously she was in prediabetes range.   She has cut sugar out of her diet. Cut back on soda.  Wants to hold off on medication.  Under the care of cardiologist, pulmonologist, oncologist  and GI.   Continues having upper abdominal pain. Recent gallbladder work up negative.    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    Gross hematuria    Hyperlipidemia    Hypertension    Mitral valve prolapse    Paroxysmal atrial fibrillation with RVR (HCC)    Pleural effusion on left    Post-thrombotic syndrome of left lower extremity    Primary hypercoagulable state (HCC)    Pulmonary emboli (HCC)    Pyelonephritis    Upper respiratory infection, viral 04/02/2021    Past Surgical History:  Procedure Laterality Date   BIOPSY  07/19/2020   Procedure: BIOPSY;  Surgeon: Napoleon Form, MD;  Location: [facility];  Service: Endoscopy;;   COLONOSCOPY     ESOPHAGOGASTRODUODENOSCOPY (EGD) WITH PROPOFOL N/A 07/19/2020   Procedure: ESOPHAGOGASTRODUODENOSCOPY (EGD) WITH PROPOFOL;  Surgeon: Napoleon Form, MD;  Location: [facility];  Service: Endoscopy;  Laterality: N/A;   HYSTERECTOMY ABDOMINAL WITH SALPINGO-OOPHORECTOMY  1975   LYMPH NODE BIOPSY Right 1983   Neck    Family History  Problem Relation Age of  Onset   Diabetes Mother    Heart disease Mother    Cancer Mother        Uterine   Diabetes Father    CAD Father    Heart attack Father    Hypothyroidism Sister    Melanoma Sister    Clotting disorder Sister    Stomach cancer Sister    Diabetes Sister    Diabetes Sister    Hypertension Sister    Hypothyroidism Sister    Diabetes Sister    Hypertension Sister    Hypothyroidism Sister    Cancer Sister    Cancer Sister    Diabetes Daughter    Diabetes Son    Diabetes Son    Breast cancer Half-Sister    Pancreatic cancer Nephew    Uterine cancer Niece    Ovarian cancer Other    Uterine cancer Other    Breast cancer Other    Colon cancer Neg Hx    Esophageal cancer Neg Hx     Social History   Socioeconomic History   Marital status: Widowed    Spouse name: Not on file   Number of children: 3   Years of education: Not on file   Highest education level: GED or equivalent  Occupational History   Occupation: Retired    Associate Professor: [employer]  Tobacco Use  Smoking status: Former    Current packs/day: 0.00    Average packs/day: 0.5 packs/day for 55.0 years (27.5 ttl pk-yrs)    Types: Cigarettes    Quit date: 07/01/2019    Years since quitting: 3.6   Smokeless tobacco: Never   Tobacco comments:    1-2 per day Stopped 02/01/2021  Vaping Use   Vaping status: Never Used  Substance and Sexual Activity   Alcohol use: No   Drug use: No   Sexual activity: Yes    Birth control/protection: Surgical, Post-menopausal  Other Topics Concern   Not on file  Social History Narrative   Current Social History 09/12/2020      Patient lives with 2 sons and granddaughter in a one story home. There are 3 steps with handrail up to the entrance the patient uses.       Patient's method of transportation is personal car.      The highest level of education was GED      The patient currently retired from [employer].      Identified important Relationships are "my children, my grandchildren, my  great-grandchildren, and my sisters"      Pets : dog and Brewing technologist / Fun: "read, watch movies, go walking, and just do things with the children."      Current Stressors: "dog"      Religious / Personal Beliefs: "Old fashioned [employer]."       Pt lives with her 2 sons and granddaughter-2025            Social Drivers of Health   Financial Resource Strain: Medium Risk (01/29/2023)   Overall Financial Resource Strain (CARDIA)    Difficulty of Paying Living Expenses: Somewhat hard  Food Insecurity: No Food Insecurity (01/29/2023)   Hunger Vital Sign    Worried About Running Out of Food in the Last Year: Never true    Ran Out of Food in the Last Year: Never true  Transportation Needs: No Transportation Needs (01/29/2023)   PRAPARE - Administrator, Civil Service (Medical): No    Lack of Transportation (Non-Medical): No  Physical Activity: Inactive (01/29/2023)   Exercise Vital Sign    Days of Exercise per Week: 0 days    Minutes of Exercise per Session: 0 min  Stress: No Stress Concern Present (01/29/2023)   [employer] of Occupational Health - Occupational Stress Questionnaire    Feeling of Stress : Not at all  Social Connections: Moderately Isolated (01/29/2023)   Social Connection and Isolation Panel [NHANES]    Frequency of Communication with Friends and Family: More than three times a week    Frequency of Social Gatherings with Friends and Family: Three times a week    Attends Religious Services: 1 to 4 times per year    Active Member of Clubs or Organizations: No    Attends Banker Meetings: Never    Marital Status: Widowed  Intimate Partner Violence: Patient Unable To Answer (01/29/2023)   Humiliation, Afraid, Rape, and Kick questionnaire    Fear of Current or Ex-Partner: Patient unable to answer    Emotionally Abused: Patient unable to answer    Physically Abused: Patient unable to answer    Sexually Abused: Patient unable to answer     Outpatient Medications Prior to Visit  Medication Sig Dispense Refill   acetaminophen (TYLENOL) 500 MG tablet Take 500 mg by mouth every 6 (six) hours as needed for  moderate pain.     albuterol (VENTOLIN HFA) 108 (90 Base) MCG/ACT inhaler Inhale 2 puffs into the lungs every 6 (six) hours as needed for wheezing or shortness of breath. 8 g 0   alendronate (FOSAMAX) 70 MG tablet Take 1 tablet (70 mg total) by mouth once a week. Take with a full glass of water on an empty stomach. 12 tablet 2   atorvastatin (LIPITOR) 20 MG tablet Take 1 tablet (20 mg total) by mouth daily. 90 tablet 3   cholecalciferol (VITAMIN D3) 25 MCG (1000 UNIT) tablet Take 1 tablet (1,000 Units total) by mouth daily. 90 tablet 0   diltiazem (DILACOR XR) 120 MG 24 hr capsule Take 1 capsule (120 mg total) by mouth daily. 90 capsule 3   lisinopril (ZESTRIL) 20 MG tablet Take 1 tablet (20 mg total) by mouth daily. 90 tablet 3   pantoprazole (PROTONIX) 40 MG tablet Take 1 tablet (40 mg total) by mouth daily. 30 tablet 1   rivaroxaban (XARELTO) 20 MG TABS tablet Take 1 tablet (20 mg total) by mouth daily with supper. 90 tablet 0   SYMBICORT 80-4.5 MCG/ACT inhaler Inhale 2 puffs by mouth twice daily 11 g 0   Tiotropium Bromide Monohydrate (SPIRIVA RESPIMAT) 2.5 MCG/ACT AERS INHALE 2 SPRAY(S) BY MOUTH ONCE DAILY AT 8 PM 4 g 1   No facility-administered medications prior to visit.    No Known Allergies  Review of Systems  Constitutional:  Negative for chills and fever.  Respiratory:  Negative for shortness of breath.   Cardiovascular:  Negative for chest pain, palpitations and leg swelling.  Gastrointestinal:  Positive for abdominal pain. Negative for constipation, diarrhea, nausea and vomiting.  Genitourinary:  Negative for dysuria, frequency and urgency.  Neurological:  Negative for dizziness and focal weakness.       Objective:    Physical Exam Constitutional:      General: She is not in acute distress.     Appearance: She is not ill-appearing.  Eyes:     Extraocular Movements: Extraocular movements intact.     Conjunctiva/sclera: Conjunctivae normal.  Cardiovascular:     Rate and Rhythm: Normal rate.  Pulmonary:     Effort: Pulmonary effort is normal.  Musculoskeletal:     Cervical back: Normal range of motion and neck supple.  Skin:    General: Skin is warm and dry.  Neurological:     General: No focal deficit present.     Mental Status: She is alert and oriented to person, place, and time.  Psychiatric:        Mood and Affect: Mood normal.        Behavior: Behavior normal.        Thought Content: Thought content normal.      BP (!) 144/90 (BP Location: Left Arm, Patient Position: Sitting, Cuff Size: Large)   Pulse 78   Temp 97.7 F (36.5 C) (Temporal)   Ht 5\' 4"  (1.626 m)   Wt 173 lb (78.5 kg)   LMP 06/21/1973   SpO2 94%   BMI 29.70 kg/m  Wt Readings from Last 3 Encounters:  02/07/23 173 lb (78.5 kg)  01/29/23 173 lb (78.5 kg)  01/17/23 173 lb 4 oz (78.6 kg)       Assessment & Plan:   Problem List Items Addressed This Visit     Aortic atherosclerosis (HCC)   Continue statin therapy. Check lipids at follow up visit       COPD (chronic obstructive pulmonary disease)  with emphysema (HCC)   Breathing improved since using Symbicort bid.  Follow up with pulmonologist and oncologist.          Hypertension   Not controlled. Increase lisinopril to 40 mg. Low sodium diet.       New onset type 2 diabetes mellitus (HCC) - Primary   Working on diet and cutting back on sugar and bread. Stopped soda. Prefers to hold off on medication until next A1c.        I am having Allyn L. Zavalza maintain her acetaminophen, alendronate, lisinopril, cholecalciferol, albuterol, pantoprazole, atorvastatin, rivaroxaban, Spiriva Respimat, diltiazem, and Symbicort.  No orders of the defined types were placed in this encounter.

## 2023-02-07 NOTE — Assessment & Plan Note (Signed)
Working on diet and cutting back on sugar and bread. Stopped soda. Prefers to hold off on medication until next A1c.

## 2023-02-07 NOTE — Assessment & Plan Note (Signed)
 Continue statin therapy. Check lipids at follow up visit

## 2023-02-07 NOTE — Patient Instructions (Signed)
Continue to limit sugar and carbohydrates.   Increase your lisinopril dose to 40 mg daily   Follow with me and we will see how your blood sugars are looking in March.

## 2023-02-12 ENCOUNTER — Ambulatory Visit: Payer: Medicare HMO | Admitting: Internal Medicine

## 2023-02-12 ENCOUNTER — Encounter: Payer: Self-pay | Admitting: Internal Medicine

## 2023-02-12 VITALS — BP 138/74 | HR 83 | Ht 64.0 in | Wt 175.4 lb

## 2023-02-12 DIAGNOSIS — J9611 Chronic respiratory failure with hypoxia: Secondary | ICD-10-CM

## 2023-02-12 DIAGNOSIS — J449 Chronic obstructive pulmonary disease, unspecified: Secondary | ICD-10-CM | POA: Diagnosis not present

## 2023-02-12 DIAGNOSIS — R911 Solitary pulmonary nodule: Secondary | ICD-10-CM

## 2023-02-12 DIAGNOSIS — R0902 Hypoxemia: Secondary | ICD-10-CM | POA: Insufficient documentation

## 2023-02-12 MED ORDER — SPIRIVA RESPIMAT 2.5 MCG/ACT IN AERS: 2.0000 | INHALATION_SPRAY | Freq: Every day | RESPIRATORY_TRACT | Status: DC

## 2023-02-12 NOTE — Patient Instructions (Addendum)
My office will be contacting you by phone for referral for PFTs and Thoracic Surgery   - if you don't hear back from my office within one week please call us back or notify us thru MyChart and we'll address it right away.    Plan A = Automatic = Always=  1st thing each am :  Symbicort 160 x 2 puffs  then spiriva x 2 puffs  and 12 hours later repeat symbicort 160 x 2 puffs  Plan B = Backup (to supplement plan A, not to replace it) Only use your albuterol inhaler as a rescue medication to be used if you can't catch your breath by resting or doing a relaxed purse lip breathing pattern.  - The less you use it, the better it will work when you need it. - Ok to use the inhaler up to 2 puffs  every 4 hours if you must but call for appointment if use goes up over your usual need - Don't leave home without it !!  (think of it like the spare tire for your car)   Be sure to use your inhalers before your PFT and your T surgery visit and your surgery to show you at your best!   Add:  Rx for POC  3lpm just with exertion   Also check quant TB and esr and rx with levaquin  500 x 7 days then return at 2 weeks with CT s contrast

## 2023-02-12 NOTE — Progress Notes (Unsigned)
Kimberly Walters, female    DOB: 23-Jul-1950   MRN: [id number]   Brief patient profile:  72  yowf  quit smoking 12/2022  referred to pulmonary clinic 02/12/2023 by Suezanne Jacquet for copd  GOLD 23 June 2019 PE L DVT / Pos for Factor V Leiden   History of Present Illness  02/12/2023  Pulmonary/ 1st office eval/Bettey Muraoka symbicort bid and spiriva  Chief Complaint  Patient presents with   Consult    Pt states she here for a review of scan.   Dyspnea:  [employer] / bbasketball with grandson no doe  Cough: no AM cough  Sleep: 3 pillows bed is flat  SABA use: never  02 use: none      No obvious day to day or daytime pattern/variability or assoc excess/ purulent sputum or mucus plugs or hemoptysis or cp or chest tightness, subjective wheeze or overt sinus or hb symptoms.    Also denies any obvious fluctuation of symptoms with weather or environmental changes or other aggravating or alleviating factors except as outlined above   No unusual exposure hx or h/o childhood pna/ asthma or knowledge of premature birth.  Current Allergies, Complete Past Medical History, Past Surgical History, Family History, and Social History were reviewed in [employer] record.  ROS  The following are not active complaints unless bolded Hoarseness, sore throat, dysphagia, dental problems, itching, sneezing,  nasal congestion or discharge of excess mucus or purulent secretions, ear ache,   fever, chills, sweats, unintended wt loss or wt gain, classically pleuritic or exertional cp,  orthopnea pnd or arm/hand swelling  or leg swelling, presyncope, palpitations, abdominal pain, anorexia, nausea, vomiting, diarrhea  or change in bowel habits or change in bladder habits, change in stools or change in urine, dysuria, hematuria,  rash, arthralgias, visual complaints, headache, numbness, weakness or ataxia or problems with walking or coordination,  change in mood or  memory.             Outpatient Medications  Prior to Visit  Medication Sig Dispense Refill   acetaminophen (TYLENOL) 500 MG tablet Take 500 mg by mouth every 6 (six) hours as needed for moderate pain.     albuterol (VENTOLIN HFA) 108 (90 Base) MCG/ACT inhaler Inhale 2 puffs into the lungs every 6 (six) hours as needed for wheezing or shortness of breath. 8 g 0   atorvastatin (LIPITOR) 20 MG tablet Take 1 tablet (20 mg total) by mouth daily. 90 tablet 3   cholecalciferol (VITAMIN D3) 25 MCG (1000 UNIT) tablet Take 1 tablet (1,000 Units total) by mouth daily. 90 tablet 0   diltiazem (DILACOR XR) 120 MG 24 hr capsule Take 1 capsule (120 mg total) by mouth daily. 90 capsule 3   lisinopril (ZESTRIL) 20 MG tablet Take 1 tablet (20 mg total) by mouth daily. 90 tablet 3   pantoprazole (PROTONIX) 40 MG tablet Take 1 tablet (40 mg total) by mouth daily. 30 tablet 1   rivaroxaban (XARELTO) 20 MG TABS tablet Take 1 tablet (20 mg total) by mouth daily with supper. 90 tablet 0   SYMBICORT 80-4.5 MCG/ACT inhaler Inhale 2 puffs by mouth twice daily 11 g 0   Tiotropium Bromide Monohydrate (SPIRIVA RESPIMAT) 2.5 MCG/ACT AERS INHALE 2 SPRAY(S) BY MOUTH ONCE DAILY AT 8 PM 4 g 1   alendronate (FOSAMAX) 70 MG tablet Take 1 tablet (70 mg total) by mouth once a week. Take with a full glass of water on  an empty stomach. (Patient not taking: Reported on 02/12/2023) 12 tablet 2   No facility-administered medications prior to visit.    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    Gross hematuria    Hyperlipidemia    Hypertension    Mitral valve prolapse    Paroxysmal atrial fibrillation with RVR (HCC)    Pleural effusion on left    Post-thrombotic syndrome of left lower extremity    Primary hypercoagulable state (HCC)     Pulmonary emboli (HCC)    Pyelonephritis    Upper respiratory infection, viral 04/02/2021      Objective:     BP 138/74 (BP Location: Left Arm, Patient Position: Sitting, Cuff Size: Large)   Pulse 83   Ht 5\' 4"  (1.626 m)   Wt 175 lb 6.4 oz (79.6 kg)   LMP 06/21/1973   SpO2 95% Comment: room air  BMI 30.11 kg/m   SpO2: 95 % (room air)  Amb mod obese (by BMI) wf still has slt smoker's rattle "but much better than it was while smoking   HEENT : Oropharynx  clear/ dentures      Nasal turbinates nl    NECK :  without  apparent JVD/ palpable Nodes/TM    LUNGS: no acc muscle use,  Nl contour chest which is clear to A and P bilaterally without cough on insp or exp maneuvers   CV:  RRR  no s3 or murmur or increase in P2, and no edema   ABD:  soft and nontender   MS:  Gait nl   ext warm without deformities Or obvious joint restrictions  calf tenderness, cyanosis or clubbing    SKIN: warm and dry without lesions    NEURO:  alert, approp, nl sensorium with  no motor or cerebellar deficits apparent.       Assessment   Solitary pulmonary nodule on lung CT Quit smoking 12/27/2022 -PFT's  03/08/21   GOLD 2 with dlco 57% - desats walking in office 02/12/2023  - PET 01/10/23 :  SPN  LLL sup segment by CT  Pos PET but ? 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 Patient able to complete one lap on room air with SaO2 86%. Patient states no SOB or dyspnea noted. Patient titrated to 3L POC pulsed oxygen and patient able to complete lap 2 and lap 3 without difficulty and SaO2 did not go below 92   Rec  02/12/2023 :  3lpm POC with exertion, no needed with slow indoor activities    Specifically: Make sure you check your oxygen saturation  AT  your highest level of activity (not after you stop)   to be sure it stays over 90% and adjust  02 flow upward to maintain this level if needed but remember to turn it back to previous settings when you stop (to conserve your supply).   COPD GOLD 2 with ex desats Quit smoking 12/2022  PFT's  03/08/21   FEV1 1.22 (54 % ) ratio 0.5  p 0 % improvement from saba p spiriva prior to study with DLCO  11.13 (57%)   and FV curve classically concave  - 02/12/2023  After extensive coaching inhaler device,  effectiveness =    75% (short Ti) with both hfa/ smi so continue symbicort 160 bid and spiriva qam (not hs)   I am surprised she is not more short of breath and I suspect stopping has helped so will repeat pfts now in consideration segmentectomy possibly    Each maintenance medication was reviewed in detail including emphasizing most importantly the difference between maintenance and prns and under what circumstances the prns are to be triggered using an action plan format where appropriate.  Total time for H and P, chart review, counseling, reviewing hfa/smi/ pulse ox/ POC device(s) , directly observing portions of ambulatory 02 saturation study/ and generating customized AVS unique to this office visit / same day charting  >  60 min  complex new pt eval                  Sandrea Hughs, MD 02/12/2023

## 2023-02-13 ENCOUNTER — Telehealth: Payer: Self-pay | Admitting: Internal Medicine

## 2023-02-13 ENCOUNTER — Other Ambulatory Visit (INDEPENDENT_AMBULATORY_CARE_PROVIDER_SITE_OTHER): Payer: Medicare HMO

## 2023-02-13 DIAGNOSIS — R911 Solitary pulmonary nodule: Secondary | ICD-10-CM

## 2023-02-13 LAB — SEDIMENTATION RATE: Sed Rate: 11 mm/h (ref 0–30)

## 2023-02-13 MED ORDER — LEVOFLOXACIN 500 MG PO TABS: 500.0000 mg | ORAL_TABLET | Freq: Every day | ORAL | 0 refills | Status: DC

## 2023-02-13 NOTE — Assessment & Plan Note (Addendum)
Quit smoking 12/27/2022 -PFT's  03/08/21   GOLD 2 with dlco 57% - desats walking in office 02/12/2023  - PET 01/10/23 :  SPN  LLL sup segment by CT  Pos PET but ? 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

## 2023-02-13 NOTE — Assessment & Plan Note (Signed)
02/12/2023   Walked on RA  x  3  lap(s) =  approx 750  ft  @ brisk  pacebrisk pace. Patient able to complete one lap on room air with SaO2 86%. Patient states no SOB or dyspnea noted. Patient titrated to 3L POC pulsed oxygen and patient able to complete lap 2 and lap 3 without difficulty and SaO2 did not go below 92   Rec 02/12/2023 :  3lpm POC with exertion, no needed with slow indoor activities    Specifically: Make sure you check your oxygen saturation  AT  your highest level of activity (not after you stop)   to be sure it stays over 90% and adjust  02 flow upward to maintain this level if needed but remember to turn it back to previous settings when you stop (to conserve your supply).

## 2023-02-13 NOTE — Telephone Encounter (Signed)
Called patient.  Gave all information per Dr. Sherene Sires.  Patient will pick up rx today and will come in tomorrow for labs.  Patient verbalized understanding.  Per request, Dr. Sherene Sires spoke with patient before call ended.

## 2023-02-13 NOTE — Telephone Encounter (Signed)
Let pt know I reviewed all her records and there is a possibilty this area is not a tumor but an area of "healing pna" so rec  Return for labs ordered  and levquin 500 mg daily x 7 days then CT s contrast in 2 weeks  - this won't interfere with the other plans because it will take a few weeks to get them going anyway

## 2023-02-13 NOTE — Assessment & Plan Note (Addendum)
Quit smoking 12/2022  PFT's  03/08/21   FEV1 1.22 (54 % ) ratio 0.5  p 0 % improvement from saba p spiriva prior to study with DLCO  11.13 (57%)   and FV curve classically concave  - 02/12/2023  After extensive coaching inhaler device,  effectiveness =    75% (short Ti) with both hfa/ smi so continue symbicort 160 bid and spiriva qam (not hs)   I am surprised she is not more short of breath and I suspect stopping has helped so will repeat pfts now in consideration segmentectomy possibly    Each maintenance medication was reviewed in detail including emphasizing most importantly the difference between maintenance and prns and under what circumstances the prns are to be triggered using an action plan format where appropriate.  Total time for H and P, chart review, counseling, reviewing hfa/smi/ pulse ox/ POC device(s) , directly observing portions of ambulatory 02 saturation study/ and generating customized AVS unique to this office visit / same day charting  >  60 min  complex new pt eval

## 2023-02-14 ENCOUNTER — Telehealth: Payer: Self-pay | Admitting: Internal Medicine

## 2023-02-14 DIAGNOSIS — J9611 Chronic respiratory failure with hypoxia: Secondary | ICD-10-CM

## 2023-02-14 NOTE — Telephone Encounter (Signed)
Hebert Soho; Montez Morita; Angus Seller, Clovis Riley This patient isn't on service with [place]. her O2 was picked up in 2021. We would need to treat this as a new start. We will need continuous sats and the order to states continuous also.

## 2023-02-15 LAB — QUANTIFERON-TB GOLD PLUS
Mitogen-NIL: 7.9 [IU]/mL
NIL: 0.04 [IU]/mL
QuantiFERON-TB Gold Plus: NEGATIVE
TB1-NIL: 0.01 [IU]/mL
TB2-NIL: 0 [IU]/mL

## 2023-02-17 ENCOUNTER — Other Ambulatory Visit: Payer: Self-pay | Admitting: Internal Medicine

## 2023-02-17 DIAGNOSIS — E559 Vitamin D deficiency, unspecified: Secondary | ICD-10-CM

## 2023-02-17 DIAGNOSIS — I10 Essential (primary) hypertension: Secondary | ICD-10-CM

## 2023-02-17 NOTE — Telephone Encounter (Signed)
The order was sent on the correct template and she does not need continuous flow so why would we put continuous on the order. The qualifying numbers are there. Please have them look at this again.

## 2023-02-17 NOTE — Telephone Encounter (Signed)
Message sent to [facility]

## 2023-02-18 ENCOUNTER — Encounter: Payer: Self-pay | Admitting: *Deleted

## 2023-02-18 NOTE — Telephone Encounter (Signed)
Order corrected

## 2023-02-18 NOTE — Telephone Encounter (Signed)
Hebert Soho; Montez Morita; Angus Seller, Clovis Riley This patient isn't on service with [place]. her O2 was picked up in 2021. We would need to treat this as a new start. We will need continuous sats and the order to states continuous also.

## 2023-02-24 ENCOUNTER — Other Ambulatory Visit: Payer: Self-pay

## 2023-02-24 ENCOUNTER — Telehealth: Payer: Self-pay | Admitting: Internal Medicine

## 2023-02-24 ENCOUNTER — Inpatient Hospital Stay: Payer: Medicare HMO | Attending: Internal Medicine

## 2023-02-24 ENCOUNTER — Inpatient Hospital Stay (HOSPITAL_BASED_OUTPATIENT_CLINIC_OR_DEPARTMENT_OTHER): Payer: Medicare HMO | Admitting: Internal Medicine

## 2023-02-24 VITALS — BP 160/84 | HR 63 | Temp 98.0°F | Resp 18 | Ht 64.0 in | Wt 177.4 lb

## 2023-02-24 DIAGNOSIS — R911 Solitary pulmonary nodule: Secondary | ICD-10-CM

## 2023-02-24 DIAGNOSIS — Z9981 Dependence on supplemental oxygen: Secondary | ICD-10-CM | POA: Diagnosis not present

## 2023-02-24 LAB — CBC WITH DIFFERENTIAL (CANCER CENTER ONLY)
Abs Immature Granulocytes: 0.04 10*3/uL (ref 0.00–0.07)
Basophils Absolute: 0.1 10*3/uL (ref 0.0–0.1)
Basophils Relative: 1 %
Eosinophils Absolute: 0.3 10*3/uL (ref 0.0–0.5)
Eosinophils Relative: 3 %
HCT: 41.9 % (ref 36.0–46.0)
Hemoglobin: 14 g/dL (ref 12.0–15.0)
Immature Granulocytes: 1 %
Lymphocytes Relative: 23 %
Lymphs Abs: 1.7 10*3/uL (ref 0.7–4.0)
MCH: 31 pg (ref 26.0–34.0)
MCHC: 33.4 g/dL (ref 30.0–36.0)
MCV: 92.7 fL (ref 80.0–100.0)
Monocytes Absolute: 0.7 10*3/uL (ref 0.1–1.0)
Monocytes Relative: 10 %
Neutro Abs: 4.8 10*3/uL (ref 1.7–7.7)
Neutrophils Relative %: 62 %
Platelet Count: 198 10*3/uL (ref 150–400)
RBC: 4.52 MIL/uL (ref 3.87–5.11)
RDW: 12.7 % (ref 11.5–15.5)
WBC Count: 7.6 10*3/uL (ref 4.0–10.5)
nRBC: 0 % (ref 0.0–0.2)

## 2023-02-24 LAB — CMP (CANCER CENTER ONLY)
ALT: 17 U/L (ref 0–44)
AST: 14 U/L — ABNORMAL LOW (ref 15–41)
Albumin: 4 g/dL (ref 3.5–5.0)
Alkaline Phosphatase: 64 U/L (ref 38–126)
Anion gap: 6 (ref 5–15)
BUN: 22 mg/dL (ref 8–23)
CO2: 28 mmol/L (ref 22–32)
Calcium: 9.3 mg/dL (ref 8.9–10.3)
Chloride: 106 mmol/L (ref 98–111)
Creatinine: 1.03 mg/dL — ABNORMAL HIGH (ref 0.44–1.00)
GFR, Estimated: 57 mL/min — ABNORMAL LOW (ref >60)
Glucose, Bld: 160 mg/dL — ABNORMAL HIGH (ref 70–99)
Potassium: 4.1 mmol/L (ref 3.5–5.1)
Sodium: 140 mmol/L (ref 135–145)
Total Bilirubin: 0.6 mg/dL (ref 0.0–1.2)
Total Protein: 6.4 g/dL — ABNORMAL LOW (ref 6.5–8.1)

## 2023-02-24 NOTE — Telephone Encounter (Signed)
Patient states [facility] does not have the order for oxygen. Patient phone number is [phone].

## 2023-02-24 NOTE — Progress Notes (Signed)
[facility] [phone]   [phone]) [phone]  OFFICE PROGRESS NOTE  Avanell Shackleton, NP-C [address] 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 female returns to the clinic today for follow-up visit accompanied by family member.Discussed the use of AI scribe software for clinical note transcription with the patient, who gave verbal consent to proceed.  History of Present Illness   The patient is a 73 year old female with a suspicious nodule in the left upper lobe who presents for follow-up regarding potential cancer. She was referred by her pulmonologist, Dr. Sherene Sires, for evaluation of a suspicious nodule in the left upper lobe.  A few weeks ago, she was evaluated for a suspicious nodule in the left upper lobe, raising concerns for cancer. She has an upcoming appointment with a surgeon, Dr. Dorris Fetch, scheduled for Wednesday. There is consideration for a biopsy if surgery is not feasible.  She has a history of chronic obstructive pulmonary disease (COPD) and uses portable oxygen during activities such as shopping or attending events. She quit smoking, which is relevant to her COPD management. She has not undergone a pulmonary function test since 2022.  She has a history of hypertension and takes medication for it. She took her blood pressure medication this morning around 7:30 AM. Today, she reports elevated blood pressure.  She experienced acute back pain last night after twisting her back while trying to avoid her son's large dog.       MEDICAL HISTORY: Past Medical History:  Diagnosis Date   Anticoagulant-induced bleeding (HCC)    Aortic atherosclerosis (HCC)    Bereavement 11/16/2018   Clotting  disorder (HCC)    Diabetes mellitus without complication (HCC)    Emphysema, unspecified (HCC)    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    Gross hematuria    Hyperlipidemia    Hypertension    Mitral valve prolapse    Paroxysmal atrial fibrillation with RVR (HCC)    Pleural effusion on left    Post-thrombotic syndrome of left lower extremity    Primary hypercoagulable state (HCC)    Pulmonary emboli (HCC)    Pyelonephritis    Upper respiratory infection, viral 04/02/2021    ALLERGIES:  has no known allergies.  MEDICATIONS:  Current Outpatient Medications  Medication Sig Dispense Refill   acetaminophen (TYLENOL) 500 MG tablet Take 500 mg by mouth every 6 (six) hours as needed for moderate pain.     albuterol (VENTOLIN HFA) 108 (90 Base) MCG/ACT inhaler Inhale 2 puffs into the lungs every 6 (six) hours as needed for wheezing or shortness of breath. 8 g 0   alendronate (FOSAMAX) 70 MG tablet Take 1 tablet (70 mg total) by mouth once a week. Take with a full glass of water on an empty stomach. (Patient not taking: Reported on 02/12/2023) 12 tablet 2   atorvastatin (LIPITOR) 20 MG tablet Take 1 tablet (20 mg total) by mouth daily. 90 tablet 3   diltiazem (DILACOR XR) 120 MG 24 hr capsule Take 1 capsule (120 mg total) by mouth daily. 90 capsule 3   levofloxacin (LEVAQUIN) 500 MG tablet Take 1 tablet (500 mg total) by mouth daily. 7 tablet 0   lisinopril (ZESTRIL) 20 MG tablet Take 1  tablet by mouth once daily 90 tablet 0   pantoprazole (PROTONIX) 40 MG tablet Take 1 tablet (40 mg total) by mouth daily. 30 tablet 1   rivaroxaban (XARELTO) 20 MG TABS tablet Take 1 tablet (20 mg total) by mouth daily with supper. 90 tablet 0   SYMBICORT 80-4.5 MCG/ACT inhaler Inhale 2 puffs by mouth twice daily 11 g 0   Tiotropium Bromide Monohydrate (SPIRIVA RESPIMAT) 2.5 MCG/ACT AERS INHALE 2 SPRAY(S) BY MOUTH ONCE DAILY AT 8 PM 4 g 1   Tiotropium Bromide Monohydrate (SPIRIVA RESPIMAT) 2.5 MCG/ACT AERS Inhale 2  puffs into the lungs daily.     Vitamin D, Cholecalciferol, 25 MCG (1000 UT) TABS Take 1 tablet by mouth once daily 90 tablet 0   No current facility-administered medications for this visit.    SURGICAL HISTORY:  Past Surgical History:  Procedure Laterality Date   BIOPSY  07/19/2020   Procedure: BIOPSY;  Surgeon: Napoleon Form, MD;  Location: [facility];  Service: Endoscopy;;   COLONOSCOPY     ESOPHAGOGASTRODUODENOSCOPY (EGD) WITH PROPOFOL N/A 07/19/2020   Procedure: ESOPHAGOGASTRODUODENOSCOPY (EGD) WITH PROPOFOL;  Surgeon: Napoleon Form, MD;  Location: [facility];  Service: Endoscopy;  Laterality: N/A;   HYSTERECTOMY ABDOMINAL WITH SALPINGO-OOPHORECTOMY  1975   LYMPH NODE BIOPSY Right 1983   Neck    REVIEW OF SYSTEMS:  A comprehensive review of systems was negative except for: Respiratory: positive for dyspnea on exertion   PHYSICAL EXAMINATION: General appearance: alert, cooperative, fatigued, and no distress Head: Normocephalic, without obvious abnormality, atraumatic Neck: no adenopathy, no JVD, supple, symmetrical, trachea midline, and thyroid not enlarged, symmetric, no tenderness/mass/nodules Lymph nodes: Cervical, supraclavicular, and axillary nodes normal. Resp: clear to auscultation bilaterally Back: symmetric, no curvature. ROM normal. No CVA tenderness. Cardio: regular rate and rhythm, S1, S2 normal, no murmur, click, rub or gallop GI: soft, non-tender; bowel sounds normal; no masses,  no organomegaly Extremities: extremities normal, atraumatic, no cyanosis or edema  ECOG PERFORMANCE STATUS: 1 - Symptomatic but completely ambulatory  Blood pressure (!) 160/84, pulse 63, temperature 98 F (36.7 C), temperature source Temporal, resp. rate 18, height 5\' 4"  (1.626 m), weight 177 lb 6.4 oz (80.5 kg), last menstrual period 06/21/1973, SpO2 95%.  LABORATORY DATA: Lab Results  Component Value Date   WBC 7.6 02/24/2023   HGB 14.0 02/24/2023   HCT 41.9  02/24/2023   MCV 92.7 02/24/2023   PLT 198 02/24/2023      Chemistry      Component Value Date/Time   NA 141 01/20/2023 1337   NA 143 08/02/2021 1518   K 4.2 01/20/2023 1337   CL 106 01/20/2023 1337   CO2 30 01/20/2023 1337   BUN 17 01/20/2023 1337   BUN 19 08/02/2021 1518   CREATININE 1.00 01/20/2023 1337      Component Value Date/Time   CALCIUM 9.8 01/20/2023 1337   ALKPHOS 62 01/20/2023 1337   AST 15 01/20/2023 1337   ALT 15 01/20/2023 1337   BILITOT 0.7 01/20/2023 1337       RADIOGRAPHIC STUDIES: NM Hepato W/EjeCT Fract Result Date: 01/28/2023 CLINICAL DATA:  Epigastric abdominal pain common nausea, bloating. EXAM: NUCLEAR MEDICINE HEPATOBILIARY IMAGING WITH GALLBLADDER EF TECHNIQUE: Sequential images of the abdomen were obtained out to 60 minutes following intravenous administration of radiopharmaceutical. After oral ingestion of Ensure, gallbladder ejection fraction was determined. At 60 min, normal ejection fraction is greater than 33%. RADIOPHARMACEUTICALS:  5.5 mCi Tc-90m  Choletec IV COMPARISON:  Ultrasound December 27, 2022 FINDINGS: Prompt uptake and biliary excretion of activity by the liver is seen. Gallbladder activity is visualized, consistent with patency of cystic duct. Biliary activity passes into small bowel, consistent with patent common bile duct. Calculated gallbladder ejection fraction is 65%. (Normal gallbladder ejection fraction with Ensure is greater than 33% and less than 80%.) IMPRESSION: 1.  Patent cystic and common bile ducts. 2.  Normal gallbladder ejection fraction. Electronically Signed   By: Maudry Mayhew M.D.   On: 01/28/2023 16:08    ASSESSMENT AND PLAN: This is a very pleasant 73 years old white female with suspicious stage Ia non-small cell lung cancer pending tissue diagnosis and presenting with left upper lobe pulmonary nodule.    Suspicious Nodule in Left Upper Lobe Suspicious nodule in the left upper lobe concerning for malignancy. Referred  to pulmonologist Dr. Sherene Sires, who scheduled an appointment with thoracic surgeon Dr. Dorris Fetch for further evaluation. Discussed potential need for biopsy or surgical resection. If surgery is not feasible due to oxygen dependency, radiation therapy is the alternative. - Follow up with Dr. Dorris Fetch for surgical evaluation - Consider bronchoscopy and biopsy if surgery is not an option - Plan for radiation therapy if surgery is not feasible  Chronic Obstructive Pulmonary Disease (COPD) COPD with recommendation for portable oxygen use during activities. Concerns about oxygen dependency affecting surgical eligibility. Need for updated pulmonary function test (PFT). - Ensure portable oxygen setup is completed - Repeat pulmonary function test (PFT)  Hypertension Elevated blood pressure noted. Regular use of antihypertensive medication. Advised to monitor blood pressure at home and follow up with family doctor if it remains elevated. - Monitor blood pressure at home - Follow up with family doctor if blood pressure remains elevated  Back Pain Acute back pain likely due to twisting injury while managing a large dog.  Follow-up - Follow up in a few weeks after meeting with Dr. Dorris Fetch.   The patient was advised to call immediately if she has any other concerning symptoms in the interval. The patient voices understanding of current disease status and treatment options and is in agreement with the current care plan.  All questions were answered. The patient knows to call the clinic with any problems, questions or concerns. We can certainly see the patient much sooner if necessary.  The total time spent in the appointment was 20 minutes.  Disclaimer: This note was dictated with voice recognition software. Similar sounding words can inadvertently be transcribed and may not be corrected upon review.

## 2023-02-24 NOTE — Telephone Encounter (Signed)
The office received a message from [place] with [employer]   Spoke with Aggie Cosier earlier, This was my response   "This patient isn't on service with [place]. her O2 was picked up in 2021. We would need to treat this as a new start. We will need continuous sats and the order to states continuous also.  " .

## 2023-02-24 NOTE — Progress Notes (Signed)
I visited with the pt briefly prior to her seeing Dr Arbutus Ped. I let her know I saw Dr Thurston Hole recommendation of referral to CT surg and needing repeat PFTs. I let her know I would try to get the PFTs before her appt on 2/5 at 4:15 with Dr Dorris Fetch.  I emailed Lorinda Creed and Ruthell Rummage, [facility] RTs in hopes of getting an appt for the pt before she sees CT surgery. Okey Regal emailed me back that there is a spot available 2/5 in the morning. I requested Okey Regal reach out to the pt to schedule the appt.

## 2023-02-26 ENCOUNTER — Other Ambulatory Visit: Payer: Self-pay | Admitting: Thoracic Surgery (Cardiothoracic Vascular Surgery)

## 2023-02-26 ENCOUNTER — Institutional Professional Consult (permissible substitution): Payer: Medicare HMO | Admitting: Thoracic Surgery (Cardiothoracic Vascular Surgery)

## 2023-02-26 ENCOUNTER — Ambulatory Visit (HOSPITAL_COMMUNITY)
Admission: RE | Admit: 2023-02-26 | Discharge: 2023-02-26 | Disposition: A | Discharge status: Home / Self Care | Payer: Medicare HMO | Source: Ambulatory Visit | Attending: Thoracic Surgery (Cardiothoracic Vascular Surgery) | Admitting: Thoracic Surgery (Cardiothoracic Vascular Surgery)

## 2023-02-26 VITALS — BP 172/79 | HR 75 | Resp 18

## 2023-02-26 DIAGNOSIS — Z87891 Personal history of nicotine dependence: Secondary | ICD-10-CM | POA: Diagnosis not present

## 2023-02-26 DIAGNOSIS — R911 Solitary pulmonary nodule: Secondary | ICD-10-CM

## 2023-02-26 DIAGNOSIS — R0609 Other forms of dyspnea: Secondary | ICD-10-CM | POA: Insufficient documentation

## 2023-02-26 LAB — PULMONARY FUNCTION TEST
DL/VA % pred: 63 %
DL/VA: 2.61 ml/min/mmHg/L
DLCO cor % pred: 52 %
DLCO cor: 10.1 ml/min/mmHg
DLCO unc % pred: 53 %
DLCO unc: 10.29 ml/min/mmHg
FEF 25-75 Post: 0.34 L/s
FEF 25-75 Pre: 0.33 L/s
FEF2575-%Change-Post: 4 %
FEF2575-%Pred-Post: 19 %
FEF2575-%Pred-Pre: 18 %
FEV1-%Change-Post: 5 %
FEV1-%Pred-Post: 36 %
FEV1-%Pred-Pre: 35 %
FEV1-Post: 0.8 L
FEV1-Pre: 0.76 L
FEV1FVC-%Change-Post: 0 %
FEV1FVC-%Pred-Pre: 64 %
FEV6-%Change-Post: 0 %
FEV6-%Pred-Post: 55 %
FEV6-%Pred-Pre: 55 %
FEV6-Post: 1.52 L
FEV6-Pre: 1.52 L
FEV6FVC-%Change-Post: -3 %
FEV6FVC-%Pred-Post: 98 %
FEV6FVC-%Pred-Pre: 102 %
FVC-%Change-Post: 4 %
FVC-%Pred-Post: 56 %
FVC-%Pred-Pre: 53 %
FVC-Post: 1.63 L
FVC-Pre: 1.56 L
Post FEV1/FVC ratio: 49 %
Post FEV6/FVC ratio: 94 %
Pre FEV1/FVC ratio: 49 %
Pre FEV6/FVC Ratio: 97 %
RV % pred: 118 %
RV: 2.67 L
TLC % pred: 95 %
TLC: 4.82 L

## 2023-02-26 MED ORDER — ALBUTEROL SULFATE (2.5 MG/3ML) 0.083% IN NEBU
2.5000 mg | INHALATION_SOLUTION | Freq: Once | RESPIRATORY_TRACT | Status: AC
  Administered 2023-02-26: 2.5 mg via RESPIRATORY_TRACT

## 2023-02-26 NOTE — Telephone Encounter (Signed)
Message from [place] with [employer]   Sats need to be co-signed or copied and pasted into the progress note please.

## 2023-02-26 NOTE — Progress Notes (Signed)
 PCP is [name], NP-C Referring Provider is Darlean Ozell NOVAK, MD  Chief Complaint  Patient presents with   Lung Lesion    Review workup    HPI: Kimberly Walters is seen for consultation regarding a left lower lobe lung nodule.  Kimberly Walters is a 73 year old woman with a history of tobacco abuse, COPD, factor V Leiden, PE, aortic atherosclerosis, hypertension, hyperlipidemia, mitral valve prolapse, paroxysmal atrial fibrillation, type 2 diabetes, and a newly discovered left lower lobe lung nodule.  She presented back in December with dyspnea with exertion going on a month.  She also had a productive cough.  She was given a course of Zithromax  which helped with her cough but not her breathing.  She saw [name].  A chest x-ray showed a left perihilar nodule.  That led to a CT which showed a 2.2 x 1.7 cm spiculated nodule in the superior segment of the left lower lobe.  PET/CT showed the nodule intensely hypermetabolic with an SUV of 8.  There was no evidence of regional or distant metastatic disease.  She is seeing Dr. Darlean and Dr. Sherrod.  She had PFTs earlier today.  She smoked half a pack to a pack a day for 55 years before quitting 2 months ago.  She has also been having a lot of difficulty with upper abdominal pain after eating.  She does not work outside the home.  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  No change in appetite or weight loss.  Apparently they have been trying to arrange for home oxygen  but she has not been able to have those arrangements completed yet.  Zubrod Score: At the time of surgery this patient's most appropriate activity status/level should be described as: []     0    Normal activity, no symptoms [x]     1    Restricted in physical strenuous activity but ambulatory, able to do out light work []     2    Ambulatory and capable of self care, unable to do work activities, up and about >50 % of waking hours                              []     3    Only limited self  care, in bed greater than 50% of waking hours []     4    Completely disabled, no self care, confined to bed or chair []     5    Moribund  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    Gross hematuria    Hyperlipidemia    Hypertension    Mitral valve prolapse    Paroxysmal atrial fibrillation with RVR (HCC)    Pleural effusion on left    Post-thrombotic syndrome of left lower extremity    Primary hypercoagulable state (HCC)    Pulmonary emboli (HCC)    Pyelonephritis    Upper respiratory infection, viral 04/02/2021    Past Surgical History:  Procedure Laterality Date   BIOPSY  07/19/2020   Procedure: BIOPSY;  Surgeon: Shila Gustav GAILS, MD;  Location: [facility];  Service: Endoscopy;;   COLONOSCOPY     ESOPHAGOGASTRODUODENOSCOPY (EGD) WITH PROPOFOL  N/A 07/19/2020  Procedure: ESOPHAGOGASTRODUODENOSCOPY (EGD) WITH PROPOFOL;  Surgeon: Napoleon Form, MD;  Location: [facility];  Service: Endoscopy;  Laterality: N/A;   HYSTERECTOMY ABDOMINAL WITH SALPINGO-OOPHORECTOMY  1975   LYMPH NODE BIOPSY Right 1983   Neck    Family History  Problem Relation Age of Onset   Diabetes Mother    Heart disease Mother    Cancer Mother        Uterine   Diabetes Father    CAD Father    Heart attack Father    Hypothyroidism Sister    Melanoma Sister    Clotting disorder Sister    Stomach cancer Sister    Diabetes Sister    Diabetes Sister    Hypertension Sister    Hypothyroidism Sister    Diabetes Sister    Hypertension Sister    Hypothyroidism Sister    Cancer Sister    Cancer Sister    Diabetes Daughter    Diabetes Son    Diabetes Son    Breast cancer Half-Sister    Pancreatic cancer Nephew    Uterine cancer Niece    Ovarian cancer  Other    Uterine cancer Other    Breast cancer Other    Colon cancer Neg Hx    Esophageal cancer Neg Hx     Social History Social History   Tobacco Use   Smoking status: Former    Current packs/day: 0.00    Average packs/day: 0.5 packs/day for 55.0 years (27.5 ttl pk-yrs)    Types: Cigarettes    Quit date: 07/01/2019    Years since quitting: 3.6   Smokeless tobacco: Never   Tobacco comments:    1-2 per day Stopped 02/01/2021  Vaping Use   Vaping status: Never Used  Substance Use Topics   Alcohol use: No   Drug use: No    Current Outpatient Medications  Medication Sig Dispense Refill   acetaminophen (TYLENOL) 500 MG tablet Take 500 mg by mouth every 6 (six) hours as needed for moderate pain.     albuterol (VENTOLIN HFA) 108 (90 Base) MCG/ACT inhaler Inhale 2 puffs into the lungs every 6 (six) hours as needed for wheezing or shortness of breath. (Patient taking differently: Inhale 2 puffs into the lungs every 6 (six) hours as needed for wheezing or shortness of breath. Takes at night) 8 g 0   atorvastatin (LIPITOR) 20 MG tablet Take 1 tablet (20 mg total) by mouth daily. 90 tablet 3   diltiazem (DILACOR XR) 120 MG 24 hr capsule Take 1 capsule (120 mg total) by mouth daily. 90 capsule 3   lisinopril (ZESTRIL) 20 MG tablet Take 1 tablet by mouth once daily (Patient taking differently: Take 20 mg by mouth daily. Takes 2 tablets) 90 tablet 0   pantoprazole (PROTONIX) 40 MG tablet Take 1 tablet (40 mg total) by mouth daily. 30 tablet 1   rivaroxaban (XARELTO) 20 MG TABS tablet Take 1 tablet (20 mg total) by mouth daily with supper. 90 tablet 0   SYMBICORT 80-4.5 MCG/ACT inhaler Inhale 2 puffs by mouth twice daily 11 g 0   Tiotropium Bromide Monohydrate (SPIRIVA RESPIMAT) 2.5 MCG/ACT AERS INHALE 2 SPRAY(S) BY MOUTH ONCE DAILY AT 8 PM (Patient taking differently: INHALE 2 SPRAY(S) BY MOUTH ONCE DAILY) 4 g 1   Vitamin D, Cholecalciferol, 25 MCG (1000 UT) TABS Take 1 tablet by mouth once  daily 90 tablet 0   No current facility-administered medications for this visit.    No Known Allergies  Review of Systems  Constitutional:  Negative for activity change and unexpected weight change.  HENT:  Negative for trouble swallowing and voice change.   Eyes:  Negative for visual disturbance.  Respiratory:  Positive for cough, shortness of breath and wheezing.   Cardiovascular:  Negative for chest pain.  Gastrointestinal:  Positive for abdominal distention, abdominal pain and nausea. Negative for vomiting.  Genitourinary:  Negative for difficulty urinating and dysuria.  Musculoskeletal:  Positive for arthralgias, back pain and joint swelling.       Leg cramps  Hematological:  Bruises/bleeds easily (On Xarelto for factor V Leiden deficiency).  All other systems reviewed and are negative.   BP (!) 172/79 (BP Location: Left Arm)   Pulse 75   Resp 18   LMP 06/21/1973   SpO2 91% Comment: RA Physical Exam Vitals reviewed.  Constitutional:      General: She is not in acute distress.    Appearance: She is obese.  HENT:     Head: Normocephalic and atraumatic.  Eyes:     General: No scleral icterus.    Extraocular Movements: Extraocular movements intact.  Cardiovascular:     Rate and Rhythm: Normal rate and regular rhythm.     Heart sounds: Normal heart sounds. No murmur heard. Pulmonary:     Effort: Pulmonary effort is normal. No respiratory distress.     Breath sounds: Wheezing present.  Abdominal:     General: There is no distension.     Palpations: Abdomen is soft.  Musculoskeletal:     Cervical back: Neck supple.  Lymphadenopathy:     Cervical: No cervical adenopathy.  Skin:    General: Skin is warm and dry.  Neurological:     General: No focal deficit present.     Mental Status: She is alert and oriented to person, place, and time.     Cranial Nerves: No cranial nerve deficit.     Motor: No weakness.    Diagnostic Tests: NUCLEAR MEDICINE PET SKULL BASE TO  THIGH   TECHNIQUE: 8.6 mCi F-18 FDG was injected intravenously. Full-ring PET imaging was performed from the skull base to thigh after the radiotracer. CT data was obtained and used for attenuation correction and anatomic localization.   Fasting blood glucose: 112 mg/dl   COMPARISON:  CT 96/29/5284   FINDINGS: NECK: No hypermetabolic lymph nodes in the neck.   Incidental CT findings: None.   CHEST: Within the LEFT lower lobe, 18 mm spiculated nodule has intense metabolic activity with SUV max equal 8.0 on image 71. Lesion appears slightly decreased in size from 20 mm on prior the differing technique.   No additional pulmonary nodules.   No hypermetabolic mediastinal nodes. No hypermetabolic supraclavicular nodes.   Incidental CT findings: None.   ABDOMEN/PELVIS: No abnormal hypermetabolic activity within the liver, pancreas, adrenal glands, or spleen. No hypermetabolic lymph nodes in the abdomen or pelvis.   Several hypermetabolic foci within the subcutaneous tissue of the lower abdomen. Favor inflammation related to hypodermic injection sites.   Incidental CT findings: Atherosclerotic calcification of the aorta. Large benign RIGHT renal cyst.   SKELETON: No focal hypermetabolic activity to suggest skeletal metastasis.   Incidental CT findings: None.   IMPRESSION: 1. Persistent hypermetabolic LEFT lower lobe pulmonary nodule. Differential includes bronchogenic carcinoma versus pulmonary infection. Minimal change in imaging characteristics over the interval. Recommend tissue sampling versus repeat CT in 4 weeks to see if lesion reduces in volume. 2. No evidence of metastatic adenopathy in the chest.  3. No evidence of distant metastatic disease. 4.  Aortic Atherosclerosis (ICD10-I70.0).     Electronically Signed   By: Jackquline Boxer M.D.   On: 01/10/2023 12:14 I personally reviewed the CT and PET/CT images.  There is a 2.2 x 1.7 cm spiculated nodule superior  segment left lower lobe with an SUV of 8.  Highly suspicious for new primary bronchogenic carcinoma.  No evidence of regional or distant metastatic disease.  Pulmonary function testing 02/26/2023 FVC 1.56 (53%) FEV1 0.76 (35%) FEV1 0.80 (36%) postbronchodilator DLCO 10.10 (52%) . Impression: Kimberly Walters is a 73 year old woman with a history of tobacco abuse, COPD, factor V Leiden, PE, aortic atherosclerosis, hypertension, hyperlipidemia, mitral valve prolapse, paroxysmal atrial fibrillation, type 2 diabetes, and a newly discovered left lower lobe lung nodule.  Left lower lobe lung nodule-highly suspicious for new primary bronchogenic carcinoma.  Infectious and inflammatory nodules are in the differential diagnosis but are far less likely.  This has to be considered a lung cancer and less to be proven otherwise.  Clinical stage would be 1A (T1, N0).  We discussed options for treatment for stage Ia lung cancer.  Those include surgical resection and radiation.  Unfortunately her pulmonary function is not adequate to tolerate a major resection.  This nodule would not be favorable for a segmentectomy and a lobectomy is out of the question.  For that matter PFTs are not adequate for a segmentectomy either.  Therefore I would recommend navigational bronchoscopy for biopsy and referral to radiation oncology consideration for stereotactic radiation.  I discussed the proposed procedure of navigational bronchoscopy with Kimberly Walters and her sister.  They understand this is an endoscopic procedure done under general anesthesia in the main operating room.  We would plan to do it on an outpatient basis.  I informed him of the indications, risks, benefits, and alternatives.  She understands the risks include, but not limited to death, MI, DVT, PE, bleeding, pneumothorax, failure to make a diagnosis, as well as possibility of other unforeseeable complications.  She is very reluctant to have radiation because her  husband had a bad experience with radiation for stage IV cancer many years ago.  I tried to reassure her that stereotactic radiation is a very different treatment than what her husband likely had.  I encouraged her to seek a second surgical opinion.  She does not wish to do so at this time.  Plan: Navigational bronchoscopy and fiducial placement Will call to schedule Follow-up with Dr. Darlean regarding home oxygen .  I spent over 30 minutes in review of records, images, and in consultation with Kimberly Walters today. Elspeth JAYSON Millers, MD [facility] [phone]

## 2023-02-26 NOTE — H&P (View-Only) (Signed)
 PCP is [name], NP-C Referring Provider is Darlean Ozell NOVAK, MD  Chief Complaint  Patient presents with   Lung Lesion    Review workup    HPI: Kimberly Walters is seen for consultation regarding a left lower lobe lung nodule.  Kimberly Walters is a 73 year old woman with a history of tobacco abuse, COPD, factor V Leiden, PE, aortic atherosclerosis, hypertension, hyperlipidemia, mitral valve prolapse, paroxysmal atrial fibrillation, type 2 diabetes, and a newly discovered left lower lobe lung nodule.  She presented back in December with dyspnea with exertion going on a month.  She also had a productive cough.  She was given a course of Zithromax  which helped with her cough but not her breathing.  She saw [name].  A chest x-ray showed a left perihilar nodule.  That led to a CT which showed a 2.2 x 1.7 cm spiculated nodule in the superior segment of the left lower lobe.  PET/CT showed the nodule intensely hypermetabolic with an SUV of 8.  There was no evidence of regional or distant metastatic disease.  She is seeing Dr. Darlean and Dr. Sherrod.  She had PFTs earlier today.  She smoked half a pack to a pack a day for 55 years before quitting 2 months ago.  She has also been having a lot of difficulty with upper abdominal pain after eating.  She does not work outside the home.  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  No change in appetite or weight loss.  Apparently they have been trying to arrange for home oxygen  but she has not been able to have those arrangements completed yet.  Zubrod Score: At the time of surgery this patient's most appropriate activity status/level should be described as: []     0    Normal activity, no symptoms [x]     1    Restricted in physical strenuous activity but ambulatory, able to do out light work []     2    Ambulatory and capable of self care, unable to do work activities, up and about >50 % of waking hours                              []     3    Only limited self  care, in bed greater than 50% of waking hours []     4    Completely disabled, no self care, confined to bed or chair []     5    Moribund  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    Gross hematuria    Hyperlipidemia    Hypertension    Mitral valve prolapse    Paroxysmal atrial fibrillation with RVR (HCC)    Pleural effusion on left    Post-thrombotic syndrome of left lower extremity    Primary hypercoagulable state (HCC)    Pulmonary emboli (HCC)    Pyelonephritis    Upper respiratory infection, viral 04/02/2021    Past Surgical History:  Procedure Laterality Date   BIOPSY  07/19/2020   Procedure: BIOPSY;  Surgeon: Shila Gustav GAILS, MD;  Location: [facility];  Service: Endoscopy;;   COLONOSCOPY     ESOPHAGOGASTRODUODENOSCOPY (EGD) WITH PROPOFOL  N/A 07/19/2020  Procedure: ESOPHAGOGASTRODUODENOSCOPY (EGD) WITH PROPOFOL ;  Surgeon: Shila Gustav GAILS, MD;  Location: [facility];  Service: Endoscopy;  Laterality: N/A;   HYSTERECTOMY ABDOMINAL WITH SALPINGO-OOPHORECTOMY  1975   LYMPH NODE BIOPSY Right 1983   Neck    Family History  Problem Relation Age of Onset   Diabetes Mother    Heart disease Mother    Cancer Mother        Uterine   Diabetes Father    CAD Father    Heart attack Father    Hypothyroidism Sister    Melanoma Sister    Clotting disorder Sister    Stomach cancer Sister    Diabetes Sister    Diabetes Sister    Hypertension Sister    Hypothyroidism Sister    Diabetes Sister    Hypertension Sister    Hypothyroidism Sister    Cancer Sister    Cancer Sister    Diabetes Daughter    Diabetes Son    Diabetes Son    Breast cancer Half-Sister    Pancreatic cancer Nephew    Uterine cancer Niece    Ovarian cancer  Other    Uterine cancer Other    Breast cancer Other    Colon cancer Neg Hx    Esophageal cancer Neg Hx     Social History Social History   Tobacco Use   Smoking status: Former    Current packs/day: 0.00    Average packs/day: 0.5 packs/day for 55.0 years (27.5 ttl pk-yrs)    Types: Cigarettes    Quit date: 07/01/2019    Years since quitting: 3.6   Smokeless tobacco: Never   Tobacco comments:    1-2 per day Stopped 02/01/2021  Vaping Use   Vaping status: Never Used  Substance Use Topics   Alcohol use: No   Drug use: No    Current Outpatient Medications  Medication Sig Dispense Refill   acetaminophen  (TYLENOL ) 500 MG tablet Take 500 mg by mouth every 6 (six) hours as needed for moderate pain.     albuterol  (VENTOLIN  HFA) 108 (90 Base) MCG/ACT inhaler Inhale 2 puffs into the lungs every 6 (six) hours as needed for wheezing or shortness of breath. (Patient taking differently: Inhale 2 puffs into the lungs every 6 (six) hours as needed for wheezing or shortness of breath. Takes at night) 8 g 0   atorvastatin  (LIPITOR) 20 MG tablet Take 1 tablet (20 mg total) by mouth daily. 90 tablet 3   diltiazem  (DILACOR XR ) 120 MG 24 hr capsule Take 1 capsule (120 mg total) by mouth daily. 90 capsule 3   lisinopril  (ZESTRIL ) 20 MG tablet Take 1 tablet by mouth once daily (Patient taking differently: Take 20 mg by mouth daily. Takes 2 tablets) 90 tablet 0   pantoprazole  (PROTONIX ) 40 MG tablet Take 1 tablet (40 mg total) by mouth daily. 30 tablet 1   rivaroxaban  (XARELTO ) 20 MG TABS tablet Take 1 tablet (20 mg total) by mouth daily with supper. 90 tablet 0   SYMBICORT  80-4.5 MCG/ACT inhaler Inhale 2 puffs by mouth twice daily 11 g 0   Tiotropium Bromide Monohydrate  (SPIRIVA  RESPIMAT) 2.5 MCG/ACT AERS INHALE 2 SPRAY(S) BY MOUTH ONCE DAILY AT 8 PM (Patient taking differently: INHALE 2 SPRAY(S) BY MOUTH ONCE DAILY) 4 g 1   Vitamin D , Cholecalciferol, 25 MCG (1000 UT) TABS Take 1 tablet by mouth once  daily 90 tablet 0   No current facility-administered medications for this visit.    No Known Allergies  Review of Systems  Constitutional:  Negative for activity change and unexpected weight change.  HENT:  Negative for trouble swallowing and voice change.   Eyes:  Negative for visual disturbance.  Respiratory:  Positive for cough, shortness of breath and wheezing.   Cardiovascular:  Negative for chest pain.  Gastrointestinal:  Positive for abdominal distention, abdominal pain and nausea. Negative for vomiting.  Genitourinary:  Negative for difficulty urinating and dysuria.  Musculoskeletal:  Positive for arthralgias, back pain and joint swelling.       Leg cramps  Hematological:  Bruises/bleeds easily (On Xarelto  for factor V Leiden deficiency).  All other systems reviewed and are negative.   BP (!) 172/79 (BP Location: Left Arm)   Pulse 75   Resp 18   LMP 06/21/1973   SpO2 91% Comment: RA Physical Exam Vitals reviewed.  Constitutional:      General: She is not in acute distress.    Appearance: She is obese.  HENT:     Head: Normocephalic and atraumatic.  Eyes:     General: No scleral icterus.    Extraocular Movements: Extraocular movements intact.  Cardiovascular:     Rate and Rhythm: Normal rate and regular rhythm.     Heart sounds: Normal heart sounds. No murmur heard. Pulmonary:     Effort: Pulmonary effort is normal. No respiratory distress.     Breath sounds: Wheezing present.  Abdominal:     General: There is no distension.     Palpations: Abdomen is soft.  Musculoskeletal:     Cervical back: Neck supple.  Lymphadenopathy:     Cervical: No cervical adenopathy.  Skin:    General: Skin is warm and dry.  Neurological:     General: No focal deficit present.     Mental Status: She is alert and oriented to person, place, and time.     Cranial Nerves: No cranial nerve deficit.     Motor: No weakness.    Diagnostic Tests: NUCLEAR MEDICINE PET SKULL BASE TO  THIGH   TECHNIQUE: 8.6 mCi F-18 FDG was injected intravenously. Full-ring PET imaging was performed from the skull base to thigh after the radiotracer. CT data was obtained and used for attenuation correction and anatomic localization.   Fasting blood glucose: 112 mg/dl   COMPARISON:  CT 87/93/7975   FINDINGS: NECK: No hypermetabolic lymph nodes in the neck.   Incidental CT findings: None.   CHEST: Within the LEFT lower lobe, 18 mm spiculated nodule has intense metabolic activity with SUV max equal 8.0 on image 71. Lesion appears slightly decreased in size from 20 mm on prior the differing technique.   No additional pulmonary nodules.   No hypermetabolic mediastinal nodes. No hypermetabolic supraclavicular nodes.   Incidental CT findings: None.   ABDOMEN/PELVIS: No abnormal hypermetabolic activity within the liver, pancreas, adrenal glands, or spleen. No hypermetabolic lymph nodes in the abdomen or pelvis.   Several hypermetabolic foci within the subcutaneous tissue of the lower abdomen. Favor inflammation related to hypodermic injection sites.   Incidental CT findings: Atherosclerotic calcification of the aorta. Large benign RIGHT renal cyst.   SKELETON: No focal hypermetabolic activity to suggest skeletal metastasis.   Incidental CT findings: None.   IMPRESSION: 1. Persistent hypermetabolic LEFT lower lobe pulmonary nodule. Differential includes bronchogenic carcinoma versus pulmonary infection. Minimal change in imaging characteristics over the interval. Recommend tissue sampling versus repeat CT in 4 weeks to see if lesion reduces in volume. 2. No evidence of metastatic adenopathy in the chest.  3. No evidence of distant metastatic disease. 4.  Aortic Atherosclerosis (ICD10-I70.0).     Electronically Signed   By: Jackquline Boxer M.D.   On: 01/10/2023 12:14 I personally reviewed the CT and PET/CT images.  There is a 2.2 x 1.7 cm spiculated nodule superior  segment left lower lobe with an SUV of 8.  Highly suspicious for new primary bronchogenic carcinoma.  No evidence of regional or distant metastatic disease.  Pulmonary function testing 02/26/2023 FVC 1.56 (53%) FEV1 0.76 (35%) FEV1 0.80 (36%) postbronchodilator DLCO 10.10 (52%) . Impression: Kimberly Walters is a 73 year old woman with a history of tobacco abuse, COPD, factor V Leiden, PE, aortic atherosclerosis, hypertension, hyperlipidemia, mitral valve prolapse, paroxysmal atrial fibrillation, type 2 diabetes, and a newly discovered left lower lobe lung nodule.  Left lower lobe lung nodule-highly suspicious for new primary bronchogenic carcinoma.  Infectious and inflammatory nodules are in the differential diagnosis but are far less likely.  This has to be considered a lung cancer and less to be proven otherwise.  Clinical stage would be 1A (T1, N0).  We discussed options for treatment for stage Ia lung cancer.  Those include surgical resection and radiation.  Unfortunately her pulmonary function is not adequate to tolerate a major resection.  This nodule would not be favorable for a segmentectomy and a lobectomy is out of the question.  For that matter PFTs are not adequate for a segmentectomy either.  Therefore I would recommend navigational bronchoscopy for biopsy and referral to radiation oncology consideration for stereotactic radiation.  I discussed the proposed procedure of navigational bronchoscopy with Mrs. Tsang and her sister.  They understand this is an endoscopic procedure done under general anesthesia in the main operating room.  We would plan to do it on an outpatient basis.  I informed him of the indications, risks, benefits, and alternatives.  She understands the risks include, but not limited to death, MI, DVT, PE, bleeding, pneumothorax, failure to make a diagnosis, as well as possibility of other unforeseeable complications.  She is very reluctant to have radiation because her  husband had a bad experience with radiation for stage IV cancer many years ago.  I tried to reassure her that stereotactic radiation is a very different treatment than what her husband likely had.  I encouraged her to seek a second surgical opinion.  She does not wish to do so at this time.  Plan: Navigational bronchoscopy and fiducial placement Will call to schedule Follow-up with Dr. Darlean regarding home oxygen .  I spent over 30 minutes in review of records, images, and in consultation with Mrs. Ptacek today. Elspeth JAYSON Millers, MD [facility] [phone]

## 2023-02-27 ENCOUNTER — Other Ambulatory Visit: Payer: Self-pay | Admitting: *Deleted

## 2023-02-27 ENCOUNTER — Encounter: Payer: Self-pay | Admitting: *Deleted

## 2023-02-27 DIAGNOSIS — R911 Solitary pulmonary nodule: Secondary | ICD-10-CM

## 2023-02-28 NOTE — Pre-Procedure Instructions (Signed)
 Surgical Instructions   Your procedure is scheduled on March 06, 2023. Report to [facility] Main Entrance "A" at 9:30 A.M., then check in with the Admitting office. Any questions or running late day of surgery: call [phone]  Questions prior to your surgery date: call [phone], Monday-Friday, 8am-4pm. If you experience any cold or flu symptoms such as cough, fever, chills, shortness of breath, etc. between now and your scheduled surgery, please notify us  at the above number.     Remember:  Do not eat or drink after midnight the night before your surgery    Take these medicines the morning of surgery with A SIP OF WATER : atorvastatin  (LIPITOR)  diltiazem  (DILACOR XR )  pantoprazole  (PROTONIX )  SYMBICORT  inhaler  Tiotropium Bromide Monohydrate  (SPIRIVA  RESPIMAT)    May take these medicines IF NEEDED: acetaminophen  (TYLENOL )  albuterol  (VENTOLIN  HFA) inhaler - please bring inhaler with you morning of surgery   STOP taking your rivaroxaban  (XARELTO ) TWO DAYS prior to surgery. DO NOT take any doses after February 10th.   One week prior to surgery, STOP taking any Aspirin (unless otherwise instructed by your surgeon) Aleve, Naproxen, Ibuprofen, Motrin, Advil, Goody's, BC's, all herbal medications, fish oil, and non-prescription vitamins.   HOW TO MANAGE YOUR DIABETES BEFORE AND AFTER SURGERY  Why is it important to control my blood sugar before and after surgery? Improving blood sugar levels before and after surgery helps healing and can limit problems. A way of improving blood sugar control is eating a healthy diet by:  Eating less sugar and carbohydrates  Increasing activity/exercise  Talking with your doctor about reaching your blood sugar goals High blood sugars (greater than 180 mg/dL) can raise your risk of infections and slow your recovery, so you will need to focus on controlling your diabetes during the weeks before surgery. Make sure that the doctor who takes  care of your diabetes knows about your planned surgery including the date and location.  How do I manage my blood sugar before surgery? Check your blood sugar at least 4 times a day, starting 2 days before surgery, to make sure that the level is not too high or low.  Check your blood sugar the morning of your surgery when you wake up and every 2 hours until you get to the Short Stay unit.  If your blood sugar is less than 70 mg/dL, you will need to treat for low blood sugar: Do not take insulin. Treat a low blood sugar (less than 70 mg/dL) with  cup of clear juice (cranberry or apple), 4 glucose tablets, OR glucose gel. Recheck blood sugar in 15 minutes after treatment (to make sure it is greater than 70 mg/dL). If your blood sugar is not greater than 70 mg/dL on recheck, call [phone] for further instructions. Report your blood sugar to the short stay nurse when you get to Short Stay.  If you are admitted to the hospital after surgery: Your blood sugar will be checked by the staff and you will probably be given insulin after surgery (instead of oral diabetes medicines) to make sure you have good blood sugar levels. The goal for blood sugar control after surgery is 80-180 mg/dL.                      Do NOT Smoke (Tobacco/Vaping) for 24 hours prior to your procedure.  If you use a CPAP at night, you may bring your mask/headgear for your overnight stay.   You will be  asked to remove any contacts, glasses, piercing's, hearing aid's, dentures/partials prior to surgery. Please bring cases for these items if needed.    Patients discharged the day of surgery will not be allowed to drive home, and someone needs to stay with them for 24 hours.  SURGICAL WAITING ROOM VISITATION Patients may have no more than 2 support people in the waiting area - these visitors may rotate.   Pre-op nurse will coordinate an appropriate time for 1 ADULT support person, who may not rotate, to accompany patient  in pre-op.  Children under the age of 87 must have an adult with them who is not the patient and must remain in the main waiting area with an adult.  If the patient needs to stay at the hospital during part of their recovery, the visitor guidelines for inpatient rooms apply.  Please refer to the [facility] website for the visitor guidelines for any additional information.   If you received a COVID test during your pre-op visit  it is requested that you wear a mask when out in public, stay away from anyone that may not be feeling well and notify your surgeon if you develop symptoms. If you have been in contact with anyone that has tested positive in the last 10 days please notify you surgeon.      Pre-operative CHG Bathing Instructions   You can play a key role in reducing the risk of infection after surgery. Your skin needs to be as free of germs as possible. You can reduce the number of germs on your skin by washing with CHG (chlorhexidine  gluconate) soap before surgery. CHG is an antiseptic soap that kills germs and continues to kill germs even after washing.   DO NOT use if you have an allergy to chlorhexidine /CHG or antibacterial soaps. If your skin becomes reddened or irritated, stop using the CHG and notify one of our RNs at [phone].              TAKE A SHOWER THE NIGHT BEFORE SURGERY AND THE DAY OF SURGERY    Please keep in mind the following:  DO NOT shave, including legs and underarms, 48 hours prior to surgery.   You may shave your face before/day of surgery.  Place clean sheets on your bed the night before surgery Use a clean washcloth (not used since being washed) for each shower. DO NOT sleep with pet's night before surgery.  CHG Shower Instructions:  Wash your face and private area with normal soap. If you choose to wash your hair, wash first with your normal shampoo.  After you use shampoo/soap, rinse your hair and body thoroughly to remove shampoo/soap residue.   Turn the water  OFF and apply half the bottle of CHG soap to a CLEAN washcloth.  Apply CHG soap ONLY FROM YOUR NECK DOWN TO YOUR TOES (washing for 3-5 minutes)  DO NOT use CHG soap on face, private areas, open wounds, or sores.  Pay special attention to the area where your surgery is being performed.  If you are having back surgery, having someone wash your back for you may be helpful. Wait 2 minutes after CHG soap is applied, then you may rinse off the CHG soap.  Pat dry with a clean towel  Put on clean pajamas    Additional instructions for the day of surgery: DO NOT APPLY any lotions, deodorants, cologne, or perfumes.   Do not wear jewelry or makeup Do not wear nail polish, gel polish,  artificial nails, or any other type of covering on natural nails (fingers and toes) Do not bring valuables to the hospital. [facility] is not responsible for valuables/personal belongings. Put on clean/comfortable clothes.  Please brush your teeth.  Ask your nurse before applying any prescription medications to the skin.

## 2023-03-03 ENCOUNTER — Encounter (HOSPITAL_COMMUNITY): Payer: Self-pay

## 2023-03-03 ENCOUNTER — Other Ambulatory Visit: Payer: Self-pay

## 2023-03-03 ENCOUNTER — Encounter (HOSPITAL_COMMUNITY)
Admission: RE | Admit: 2023-03-03 | Discharge: 2023-03-03 | Disposition: A | Discharge status: Home / Self Care | Payer: Medicare HMO | Source: Ambulatory Visit | Attending: Thoracic Surgery (Cardiothoracic Vascular Surgery)

## 2023-03-03 ENCOUNTER — Ambulatory Visit (HOSPITAL_COMMUNITY)
Admission: RE | Admit: 2023-03-03 | Discharge: 2023-03-03 | Disposition: A | Discharge status: Home / Self Care | Payer: Medicare HMO | Source: Ambulatory Visit | Attending: Thoracic Surgery (Cardiothoracic Vascular Surgery) | Admitting: Thoracic Surgery (Cardiothoracic Vascular Surgery)

## 2023-03-03 VITALS — BP 159/73 | HR 62 | Temp 98.0°F | Ht 64.0 in | Wt 176.7 lb

## 2023-03-03 DIAGNOSIS — Z01812 Encounter for preprocedural laboratory examination: Secondary | ICD-10-CM | POA: Diagnosis present

## 2023-03-03 DIAGNOSIS — E119 Type 2 diabetes mellitus without complications: Secondary | ICD-10-CM

## 2023-03-03 DIAGNOSIS — R911 Solitary pulmonary nodule: Secondary | ICD-10-CM | POA: Diagnosis not present

## 2023-03-03 DIAGNOSIS — J439 Emphysema, unspecified: Secondary | ICD-10-CM | POA: Diagnosis not present

## 2023-03-03 DIAGNOSIS — R0602 Shortness of breath: Secondary | ICD-10-CM | POA: Diagnosis present

## 2023-03-03 DIAGNOSIS — Z01811 Encounter for preprocedural respiratory examination: Secondary | ICD-10-CM | POA: Diagnosis present

## 2023-03-03 DIAGNOSIS — J984 Other disorders of lung: Secondary | ICD-10-CM | POA: Diagnosis not present

## 2023-03-03 DIAGNOSIS — Z01818 Encounter for other preprocedural examination: Secondary | ICD-10-CM | POA: Insufficient documentation

## 2023-03-03 HISTORY — DX: Personal history of other diseases of the digestive system: Z87.19

## 2023-03-03 HISTORY — DX: Solitary pulmonary nodule: R91.1

## 2023-03-03 HISTORY — DX: Activated protein C resistance: D68.51

## 2023-03-03 HISTORY — DX: Gastro-esophageal reflux disease without esophagitis: K21.9

## 2023-03-03 LAB — CBC
HCT: 44.3 % (ref 36.0–46.0)
Hemoglobin: 14.6 g/dL (ref 12.0–15.0)
MCH: 30.9 pg (ref 26.0–34.0)
MCHC: 33 g/dL (ref 30.0–36.0)
MCV: 93.7 fL (ref 80.0–100.0)
Platelets: 227 10*3/uL (ref 150–400)
RBC: 4.73 MIL/uL (ref 3.87–5.11)
RDW: 12.8 % (ref 11.5–15.5)
WBC: 7.8 10*3/uL (ref 4.0–10.5)
nRBC: 0 % (ref 0.0–0.2)

## 2023-03-03 LAB — COMPREHENSIVE METABOLIC PANEL
ALT: 20 U/L (ref 0–44)
AST: 24 U/L (ref 15–41)
Albumin: 3.7 g/dL (ref 3.5–5.0)
Alkaline Phosphatase: 59 U/L (ref 38–126)
Anion gap: 9 (ref 5–15)
BUN: 17 mg/dL (ref 8–23)
CO2: 26 mmol/L (ref 22–32)
Calcium: 9.9 mg/dL (ref 8.9–10.3)
Chloride: 106 mmol/L (ref 98–111)
Creatinine, Ser: 0.9 mg/dL (ref 0.44–1.00)
GFR, Estimated: >60 mL/min (ref >60)
Glucose, Bld: 122 mg/dL — ABNORMAL HIGH (ref 70–99)
Potassium: 4.6 mmol/L (ref 3.5–5.1)
Sodium: 141 mmol/L (ref 135–145)
Total Bilirubin: 1.3 mg/dL — ABNORMAL HIGH (ref 0.0–1.2)
Total Protein: 6.5 g/dL (ref 6.5–8.1)

## 2023-03-03 LAB — PROTIME-INR
INR: 1.1 (ref 0.8–1.2)
Prothrombin Time: 14.6 s (ref 11.4–15.2)

## 2023-03-03 LAB — HEMOGLOBIN A1C
Hgb A1c MFr Bld: 7.1 % — ABNORMAL HIGH (ref 4.8–5.6)
Mean Plasma Glucose: 157.07 mg/dL

## 2023-03-03 LAB — APTT: aPTT: 30 s (ref 24–36)

## 2023-03-03 LAB — GLUCOSE, CAPILLARY: Glucose-Capillary: 132 mg/dL — ABNORMAL HIGH (ref 70–99)

## 2023-03-03 NOTE — Progress Notes (Signed)
 PCP - Alyson Back, NP-C Cardiologist - Dr. Jackquelyn Mass Pulmonologist- Dr. Vernestine Gondola  PPM/ICD - denies   Chest x-ray - 12/24/22 EKG - 12/24/22 Stress Test - denies ECHO - 02/02/21 Cardiac Cath - 1989 and 1996, per pt, with Dr. Ebbie Goldmann  Sleep Study - denies   DM- pt recently diagnosed type 2 DM. No DM meds. Pt does not check CBG at home and does not know typical fasting levels  Last dose of GLP1 agonist-  n/a   Blood Thinner Instructions: Hold Xarelto  2 days. Last dose 2/10. Aspirin Instructions: n/a  ERAS Protcol - no, NPO   COVID TEST- n/a   Anesthesia review: yes, cardiac hx, pulm hx  Patient denies shortness of breath, fever, cough and chest pain at PAT appointment   All instructions explained to the patient, with a verbal understanding of the material. Patient agrees to go over the instructions while at home for a better understanding. The opportunity to ask questions was provided.

## 2023-03-04 ENCOUNTER — Other Ambulatory Visit (HOSPITAL_COMMUNITY): Payer: Medicare HMO

## 2023-03-04 ENCOUNTER — Encounter (HOSPITAL_COMMUNITY): Payer: Self-pay

## 2023-03-04 NOTE — Progress Notes (Signed)
Case: [id number] Date/Time: 03/06/23 1124   Procedures:      VIDEO BRONCHOSCOPY WITH ENDOBRONCHIAL NAVIGATION     PLACEMENT OF FUDUCIAL   Anesthesia type: General   Pre-op diagnosis: LLL LUNG NODULE   [facility]: [facility] ROOM 10 / [facility]   Surgeons: Loreli Slot, MD       DISCUSSION: Kimberly Walters is a 73 yo female who presents to PAT prior to surgery above. PMH of former smoking (quit 12/2022), HTN, aortic atherosclerosis, MVP, A.fib, Factor V Leiden, hx of PE on Xarelto, COPD with chronic respiratory failure, hiatal hernia, GERD, DM (A1c 7.3 12/24/22).  Patient was seen by PCP in Dec 2024 for SOB with exertion. CXR showed ground glass opacities vs ill defined pulmonary nodule. CT Chest was obtained which showed spiculated nodule in the superior segment of the LEFT lower lobe. She was referred to Pulmonology. Also of note she had prediabetes which has progressed to diabetes.  Patient established care with Pulmonology on 02/12/23. Seen by Dr. Sherene Sires. She was treated with antibiotics and inhalers and referred to CT surgery. Also recommended home O2 of 3L with exertion.  Seen by Dr. Dorris Fetch on 02/26/23. Deemed not a candidate for resection due to her poor lung function. Biopsy and referral to radiation oncology for consideration of stereotactic radiation advised.  Patient with hx of Factor V Leiden and DVT/PE in 2021. She is maintained on Xarelto by Hematology. She has been recommended to be on indefinite therapy.  Patient established care with Cardiology on 01/01/23. Seen by Dr. Flora Lipps.  She reports a history of ventricular tachycardia back in 1988 and 1989, for which she underwent four heart catheterizations but no cause was found. No CP reported. Mild coronary calcification on recent chest CT. No signs of heart failure on exam. Echo in 2023 showed normal EF.  Holter monitor was ordered and showed brief runs of V tach and SVT. Per Dr. Flora Lipps: "Suspect this is related to her lung disease.  I would  like to start her on diltiazem 120 mg extended release daily.  Her echo last year was normal.  I do not believe she needs to repeat this. Please have her follow-up with an APP in 2 to 3 months to evaluate symptoms."  Blood Thinner Instructions: Hold Xarelto 2 days. Last dose 2/10.  VS: BP (!) 159/73   Pulse 62   Temp 36.7 C   Ht 5\' 4"  (1.626 m)   Wt 80.2 kg   LMP 06/21/1973   SpO2 92%   BMI 30.33 kg/m   PROVIDERS: Avanell Shackleton, NP-C Cardiologist - Dr. Lennie Odor Pulmonologist- Dr. Sandrea Hughs  LABS: Labs reviewed: Acceptable for surgery. (all labs ordered are listed, but only abnormal results are displayed)  Labs Reviewed  GLUCOSE, CAPILLARY - Abnormal; Notable for the following components:      Result Value   Glucose-Capillary 132 (*)    All other components within normal limits  COMPREHENSIVE METABOLIC PANEL - Abnormal; Notable for the following components:   Glucose, Bld 122 (*)    Total Bilirubin 1.3 (*)    All other components within normal limits  HEMOGLOBIN A1C - Abnormal; Notable for the following components:   Hgb A1c MFr Bld 7.1 (*)    All other components within normal limits  CBC  PROTIME-INR  APTT     IMAGES: CT Chest 12/27/22:  IMPRESSION: 1. Spiculated nodule in the superior segment of the LEFT lower lobe is highly concerning for primary lung  neoplasm. Recommend FDG PET scan for staging. If multidisciplinary follow up management is desired, this is available in the [facility] through the [facility] [phone]. 2. No evidence of metastatic adenopathy. 3. Tiny RIGHT lower lobe nodule is indeterminate. 4. Aortic Atherosclerosis (ICD10-I70.0) and Emphysema (ICD10-J43.9).  EKG 12/24/22:  NSR, rate 90   Pulmonary function testing 02/26/2023 FVC 1.56 (53%) FEV1 0.76 (35%) FEV1 0.80 (36%) postbronchodilator DLCO 10.10 (52%)   CV:  Holter monitor 01/16/23:  Impression: Brief non-sustained ventricular  tachycardia was present (5 episodes in 7 days; longest duration 3.6 seconds).  Brief supraventricular tachycardia detected (7 episodes in 7 days; longest duration 3.8 seconds).  Frequent PVCs (5.0% burden).   Echo 02/02/2021:  IMPRESSIONS    1. Left ventricular ejection fraction, by estimation, is 55 to 60%. The left ventricle has normal function. The left ventricle has no regional wall motion abnormalities. Left ventricular diastolic parameters are consistent with Grade I diastolic dysfunction (impaired relaxation).  2. Right ventricular systolic function is normal. The right ventricular size is normal. Tricuspid regurgitation signal is inadequate for assessing PA pressure.  3. The mitral valve is normal in structure. Trivial mitral valve regurgitation.  4. The aortic valve is tricuspid. Aortic valve regurgitation is trivial. No aortic stenosis is present.  5. The inferior vena cava is normal in size with greater than 50% respiratory variability, suggesting right atrial pressure of 3 mmHg.  6. Agitated saline contrast bubble study was negative, with no evidence of any interatrial shunt.  Comparison(s): Compared to prior TTE in 06/2019, the LVEF appears slightly better at ~55% (previously 50-55%). Otherwise, there is no significant change.   Past Medical History:  Diagnosis Date   Anticoagulant-induced bleeding (HCC)    Aortic atherosclerosis (HCC)    Bereavement 11/16/2018   Cardiac arrest (HCC) 1989   pt states her heart stopped and they had to shock her heart and restarted it (w/ Dr. 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    GERD (gastroesophageal reflux disease)    Gross hematuria    History of hiatal hernia    Hyperlipidemia    Hypertension    Mitral valve prolapse    Nodule of lower lobe of left lung    Paroxysmal  atrial fibrillation with RVR (HCC)    Pleural effusion on left    Post-thrombotic syndrome of left lower extremity    Primary hypercoagulable state (HCC)    Pulmonary emboli (HCC)    Pyelonephritis 2021   Upper respiratory infection, viral 04/02/2021    Past Surgical History:  Procedure Laterality Date   BIOPSY  07/19/2020   Procedure: BIOPSY;  Surgeon: Napoleon Form, MD;  Location: [facility];  Service: Endoscopy;;   CARDIAC CATHETERIZATION     pt states she had one in 1989 and 1996 w/ Dr. Elsie Lincoln   COLONOSCOPY     ESOPHAGOGASTRODUODENOSCOPY (EGD) WITH PROPOFOL N/A 07/19/2020   Procedure: ESOPHAGOGASTRODUODENOSCOPY (EGD) WITH PROPOFOL;  Surgeon: Napoleon Form, MD;  Location: [facility];  Service: Endoscopy;  Laterality: N/A;   HYSTERECTOMY ABDOMINAL WITH SALPINGO-OOPHORECTOMY  1975   LYMPH NODE BIOPSY Right 1983   Neck    MEDICATIONS:  acetaminophen (TYLENOL) 500 MG tablet   albuterol (VENTOLIN HFA) 108 (90 Base) MCG/ACT inhaler   atorvastatin (LIPITOR) 20 MG tablet   diltiazem (DILACOR XR) 120 MG  24 hr capsule   lisinopril (ZESTRIL) 20 MG tablet   pantoprazole (PROTONIX) 40 MG tablet   rivaroxaban (XARELTO) 20 MG TABS tablet   SYMBICORT 80-4.5 MCG/ACT inhaler   Tiotropium Bromide Monohydrate (SPIRIVA RESPIMAT) 2.5 MCG/ACT AERS   Vitamin D, Cholecalciferol, 25 MCG (1000 UT) TABS   No current facility-administered medications for this encounter.   Marcille Blanco [facility] Surgical Short Stay/Anesthesiology [facility] Phone [phone] 03/04/2023 11:05 AM

## 2023-03-05 ENCOUNTER — Other Ambulatory Visit: Payer: Self-pay | Admitting: Family Medicine

## 2023-03-06 ENCOUNTER — Ambulatory Visit (HOSPITAL_COMMUNITY): Payer: Self-pay | Admitting: Medical

## 2023-03-06 ENCOUNTER — Encounter (HOSPITAL_COMMUNITY)
Admission: RE | Disposition: A | Discharge status: Home / Self Care | Payer: Self-pay | Source: Home / Self Care | Attending: Thoracic Surgery (Cardiothoracic Vascular Surgery)

## 2023-03-06 ENCOUNTER — Ambulatory Visit (HOSPITAL_COMMUNITY): Payer: Medicare HMO

## 2023-03-06 ENCOUNTER — Ambulatory Visit (HOSPITAL_COMMUNITY)
Admission: RE | Admit: 2023-03-06 | Discharge: 2023-03-06 | Disposition: A | Discharge status: Home / Self Care | Payer: Medicare HMO | Attending: Thoracic Surgery (Cardiothoracic Vascular Surgery) | Admitting: Thoracic Surgery (Cardiothoracic Vascular Surgery)

## 2023-03-06 ENCOUNTER — Ambulatory Visit (HOSPITAL_BASED_OUTPATIENT_CLINIC_OR_DEPARTMENT_OTHER): Payer: Self-pay | Admitting: Anesthesiology

## 2023-03-06 DIAGNOSIS — K219 Gastro-esophageal reflux disease without esophagitis: Secondary | ICD-10-CM | POA: Diagnosis not present

## 2023-03-06 DIAGNOSIS — I341 Nonrheumatic mitral (valve) prolapse: Secondary | ICD-10-CM | POA: Diagnosis not present

## 2023-03-06 DIAGNOSIS — D6851 Activated protein C resistance: Secondary | ICD-10-CM | POA: Diagnosis not present

## 2023-03-06 DIAGNOSIS — E785 Hyperlipidemia, unspecified: Secondary | ICD-10-CM | POA: Insufficient documentation

## 2023-03-06 DIAGNOSIS — I700 Atherosclerosis of aorta: Secondary | ICD-10-CM | POA: Diagnosis not present

## 2023-03-06 DIAGNOSIS — I10 Essential (primary) hypertension: Secondary | ICD-10-CM

## 2023-03-06 DIAGNOSIS — R911 Solitary pulmonary nodule: Secondary | ICD-10-CM | POA: Insufficient documentation

## 2023-03-06 DIAGNOSIS — E119 Type 2 diabetes mellitus without complications: Secondary | ICD-10-CM | POA: Insufficient documentation

## 2023-03-06 DIAGNOSIS — I480 Paroxysmal atrial fibrillation: Secondary | ICD-10-CM | POA: Diagnosis not present

## 2023-03-06 DIAGNOSIS — Z8674 Personal history of sudden cardiac arrest: Secondary | ICD-10-CM | POA: Insufficient documentation

## 2023-03-06 DIAGNOSIS — K449 Diaphragmatic hernia without obstruction or gangrene: Secondary | ICD-10-CM | POA: Diagnosis not present

## 2023-03-06 DIAGNOSIS — J449 Chronic obstructive pulmonary disease, unspecified: Secondary | ICD-10-CM | POA: Diagnosis not present

## 2023-03-06 DIAGNOSIS — Z87891 Personal history of nicotine dependence: Secondary | ICD-10-CM | POA: Diagnosis not present

## 2023-03-06 DIAGNOSIS — Z86711 Personal history of pulmonary embolism: Secondary | ICD-10-CM | POA: Diagnosis not present

## 2023-03-06 HISTORY — PX: VIDEO BRONCHOSCOPY WITH ENDOBRONCHIAL NAVIGATION: SHX6175

## 2023-03-06 HISTORY — PX: FUDUCIAL PLACEMENT: SHX5083

## 2023-03-06 LAB — GLUCOSE, CAPILLARY
Glucose-Capillary: 158 mg/dL — ABNORMAL HIGH (ref 70–99)
Glucose-Capillary: 140 mg/dL — ABNORMAL HIGH (ref 70–99)

## 2023-03-06 SURGERY — VIDEO BRONCHOSCOPY WITH ENDOBRONCHIAL NAVIGATION
Anesthesia: General

## 2023-03-06 MED ORDER — 0.9 % SODIUM CHLORIDE (POUR BTL) OPTIME
TOPICAL | Status: DC | PRN
  Administered 2023-03-06: 1000 mL

## 2023-03-06 MED ORDER — LACTATED RINGERS IV SOLN: INTRAVENOUS | Status: DC

## 2023-03-06 MED ORDER — CHLORHEXIDINE GLUCONATE 0.12 % MT SOLN
15.0000 mL | Freq: Once | OROMUCOSAL | Status: AC
  Administered 2023-03-06: 15 mL via OROMUCOSAL
  Filled 2023-03-06: qty 15

## 2023-03-06 MED ORDER — PROPOFOL 500 MG/50ML IV EMUL
INTRAVENOUS | Status: DC | PRN
  Administered 2023-03-06: 150 ug/kg/min via INTRAVENOUS

## 2023-03-06 MED ORDER — EPINEPHRINE PF 1 MG/ML IJ SOLN
INTRAMUSCULAR | Status: AC
  Filled 2023-03-06: qty 1

## 2023-03-06 MED ORDER — PROPOFOL 10 MG/ML IV BOLUS
INTRAVENOUS | Status: DC | PRN
  Administered 2023-03-06: 50 mg via INTRAVENOUS
  Administered 2023-03-06: 100 mg via INTRAVENOUS

## 2023-03-06 MED ORDER — DROPERIDOL 2.5 MG/ML IJ SOLN: 0.6250 mg | Freq: Once | INTRAMUSCULAR | Status: DC | PRN

## 2023-03-06 MED ORDER — FENTANYL CITRATE (PF) 100 MCG/2ML IJ SOLN: 25.0000 ug | INTRAMUSCULAR | Status: DC | PRN

## 2023-03-06 MED ORDER — EPINEPHRINE PF 1 MG/ML IJ SOLN
INTRAMUSCULAR | Status: DC | PRN
  Administered 2023-03-06: .3 mg

## 2023-03-06 MED ORDER — ORAL CARE MOUTH RINSE: 15.0000 mL | Freq: Once | OROMUCOSAL | Status: AC

## 2023-03-06 MED ORDER — ROCURONIUM BROMIDE 100 MG/10ML IV SOLN
INTRAVENOUS | Status: DC | PRN
  Administered 2023-03-06: 50 mg via INTRAVENOUS

## 2023-03-06 MED ORDER — FENTANYL CITRATE (PF) 250 MCG/5ML IJ SOLN
INTRAMUSCULAR | Status: AC
  Filled 2023-03-06: qty 5

## 2023-03-06 MED ORDER — SUGAMMADEX SODIUM 200 MG/2ML IV SOLN
INTRAVENOUS | Status: DC | PRN
Start: 2023-03-06 — End: 2023-03-06
  Administered 2023-03-06: 200 mg via INTRAVENOUS

## 2023-03-06 MED ORDER — OXYCODONE HCL 5 MG/5ML PO SOLN: 5.0000 mg | Freq: Once | ORAL | Status: DC | PRN

## 2023-03-06 MED ORDER — LIDOCAINE HCL (CARDIAC) PF 100 MG/5ML IV SOSY
PREFILLED_SYRINGE | INTRAVENOUS | Status: DC | PRN
  Administered 2023-03-06: 8 mg via INTRATRACHEAL

## 2023-03-06 MED ORDER — PHENYLEPHRINE HCL (PRESSORS) 10 MG/ML IV SOLN
INTRAVENOUS | Status: DC | PRN
Start: 2023-03-06 — End: 2023-03-06
  Administered 2023-03-06: 80 ug via INTRAVENOUS

## 2023-03-06 MED ORDER — FENTANYL CITRATE (PF) 250 MCG/5ML IJ SOLN
INTRAMUSCULAR | Status: DC | PRN
  Administered 2023-03-06: 100 ug via INTRAVENOUS
  Administered 2023-03-06: 50 ug via INTRAVENOUS

## 2023-03-06 MED ORDER — ACETAMINOPHEN 10 MG/ML IV SOLN: 1000.0000 mg | Freq: Once | INTRAVENOUS | Status: DC | PRN

## 2023-03-06 MED ORDER — OXYCODONE HCL 5 MG PO TABS: 5.0000 mg | ORAL_TABLET | Freq: Once | ORAL | Status: DC | PRN

## 2023-03-06 MED ORDER — ONDANSETRON HCL 4 MG/2ML IJ SOLN
INTRAMUSCULAR | Status: DC | PRN
  Administered 2023-03-06: 4 mg via INTRAVENOUS

## 2023-03-06 SURGICAL SUPPLY — 44 items
ADAPTER BRONCHOSCOPE OLYMPUS (ADAPTER) ×2 IMPLANT
ADAPTER VALVE BIOPSY EBUS (MISCELLANEOUS) IMPLANT
BLADE CLIPPER SURG (BLADE) ×2 IMPLANT
BRUSH BIOPSY BRONCH 10 SDTNB (MISCELLANEOUS) IMPLANT
BRUSH SUPERTRAX BIOPSY (INSTRUMENTS) IMPLANT
BRUSH SUPERTRAX NDL-TIP CYTO (INSTRUMENTS) ×2 IMPLANT
CANISTER SUCT 3000ML PPV (MISCELLANEOUS) ×2 IMPLANT
CNTNR URN SCR LID CUP LEK RST (MISCELLANEOUS) ×4 IMPLANT
COVER BACK TABLE 60X90IN (DRAPES) ×2 IMPLANT
FILTER STRAW FLUID ASPIR (MISCELLANEOUS) ×2 IMPLANT
FORCEPS BIOP SUPERTRX PREMAR (INSTRUMENTS) IMPLANT
GAUZE 4X4 16PLY ~~LOC~~+RFID DBL (SPONGE) IMPLANT
GAUZE SPONGE 4X4 12PLY STRL (GAUZE/BANDAGES/DRESSINGS) ×2 IMPLANT
GLOVE SS BIOGEL STRL SZ 7.5 (GLOVE) ×2 IMPLANT
GLOVE SURG SIGNA 7.5 PF LTX (GLOVE) ×2 IMPLANT
GOWN STRL REUS W/ TWL XL LVL3 (GOWN DISPOSABLE) ×2 IMPLANT
KIT CLEAN ENDO COMPLIANCE (KITS) ×2 IMPLANT
KIT ILLUMISITE 180 PROCEDURE (KITS) IMPLANT
KIT ILLUMISITE 90 PROCEDURE (KITS) IMPLANT
KIT TURNOVER KIT B (KITS) ×2 IMPLANT
MARKER FIDUCIAL SL NIT COIL (Implant Marker) IMPLANT
MARKER SKIN DUAL TIP RULER LAB (MISCELLANEOUS) ×2 IMPLANT
NDL SUPERTRX PREMARK BIOPSY (NEEDLE) IMPLANT
NEEDLE SUPERTRX PREMARK BIOPSY (NEEDLE)
NS IRRIG 1000ML POUR BTL (IV SOLUTION) ×2 IMPLANT
OIL SILICONE PENTAX (PARTS (SERVICE/REPAIRS)) ×2 IMPLANT
PAD ARMBOARD 7.5X6 YLW CONV (MISCELLANEOUS) ×4 IMPLANT
PATCHES PATIENT (LABEL) ×6 IMPLANT
SPONGE T-LAP 18X18 ~~LOC~~+RFID (SPONGE) IMPLANT
SPONGE T-LAP 4X18 ~~LOC~~+RFID (SPONGE) IMPLANT
SYR 20ML ECCENTRIC (SYRINGE) ×2 IMPLANT
SYR 20ML LL LF (SYRINGE) ×4 IMPLANT
SYR 30ML LL (SYRINGE) IMPLANT
SYR 50ML LL SCALE MARK (SYRINGE) ×2 IMPLANT
SYR 5ML LL (SYRINGE) ×2 IMPLANT
SYR TB 1ML LUER SLIP (SYRINGE) IMPLANT
TOWEL GREEN STERILE (TOWEL DISPOSABLE) ×2 IMPLANT
TOWEL GREEN STERILE FF (TOWEL DISPOSABLE) ×2 IMPLANT
TRAP SPECIMEN MUCUS 40CC (MISCELLANEOUS) ×2 IMPLANT
TUBE CONNECTING 20X1/4 (TUBING) ×4 IMPLANT
UNDERPAD 30X36 HEAVY ABSORB (UNDERPADS AND DIAPERS) ×2 IMPLANT
VALVE BIOPSY SINGLE USE (MISCELLANEOUS) ×2 IMPLANT
VALVE SUCTION BRONCHIO DISP (MISCELLANEOUS) ×2 IMPLANT
WATER STERILE IRR 1000ML POUR (IV SOLUTION) ×2 IMPLANT

## 2023-03-06 NOTE — Anesthesia Procedure Notes (Signed)
Procedure Name: Intubation Date/Time: 03/06/2023 11:29 AM  Performed by: Venia Carbon, CRNAPre-anesthesia Checklist: Patient identified, Emergency Drugs available, Suction available, Patient being monitored and Timeout performed Patient Re-evaluated:Patient Re-evaluated prior to induction Oxygen Delivery Method: Circle system utilized Preoxygenation: Pre-oxygenation with 100% oxygen Induction Type: IV induction Ventilation: Mask ventilation without difficulty Laryngoscope Size: Mac and 3 Grade View: Grade I Tube type: Oral Tube size: 8.5 mm Number of attempts: 1 Airway Equipment and Method: Patient positioned with wedge pillow and Stylet Placement Confirmation: positive ETCO2, CO2 detector, breath sounds checked- equal and bilateral and ETT inserted through vocal cords under direct vision Secured at: 21 cm Tube secured with: Tape

## 2023-03-06 NOTE — Interval H&P Note (Signed)
History and Physical Interval Note:  03/06/2023 10:46 AM  Kimberly Walters  has presented today for surgery, with the diagnosis of LLL LUNG NODULE.  The various methods of treatment have been discussed with the patient and family. After consideration of risks, benefits and other options for treatment, the patient has consented to  Procedure(s): VIDEO BRONCHOSCOPY WITH ENDOBRONCHIAL NAVIGATION (N/A) PLACEMENT OF FUDUCIAL (N/A) as a surgical intervention.  The patient's history has been reviewed, patient examined, no change in status, stable for surgery.  I have reviewed the patient's chart and labs.  Questions were answered to the patient's satisfaction.     Loreli Slot

## 2023-03-06 NOTE — Anesthesia Postprocedure Evaluation (Signed)
Anesthesia Post Note  Patient: Kimberly Walters  Procedure(s) Performed: VIDEO BRONCHOSCOPY WITH ENDOBRONCHIAL NAVIGATION PLACEMENT OF FUDUCIAL     Patient location during evaluation: PACU Anesthesia Type: General Level of consciousness: awake and alert Pain management: pain level controlled Vital Signs Assessment: post-procedure vital signs reviewed and stable Respiratory status: spontaneous breathing, nonlabored ventilation, respiratory function stable and patient connected to nasal cannula oxygen Cardiovascular status: blood pressure returned to baseline and stable Postop Assessment: no apparent nausea or vomiting Anesthetic complications: no   No notable events documented.  Last Vitals:  Vitals:   03/06/23 1245 03/06/23 1315  BP: (!) 124/56 (!) 120/57  Pulse: 65 (!) 58  Resp: (!) 21 12  Temp:    SpO2: 91% 98%    Last Pain:  Vitals:   03/06/23 1230  PainSc: 0-No pain                 [place]

## 2023-03-06 NOTE — Discharge Instructions (Addendum)
Do not drive or engage in heavy physical activity for 24 hours.  You may resume normal activities tomorrow.  You may cough up small amounts of blood over the next few days.  You may use acetaminophen (tylenol) if needed for discomfort.  You may use over the counter cough medications and/ or throat lozenges of your choice as needed.  Wait until Saturday 03/08/2023 to resume Xarelto  Call [phone] if you develop chest pain, shortness of breath, develop a fever > 101 F, or cough up more than a tablespoon of blood.  Follow up with Dr. Arbutus Ped next week as scheduled.  He should have the results by then and can arrange a referral to the radiation doctors.

## 2023-03-06 NOTE — Anesthesia Preprocedure Evaluation (Signed)
Anesthesia Evaluation  Patient identified by MRN, date of birth, ID band Patient awake    Reviewed: Allergy & Precautions, H&P , NPO status , Patient's Chart, lab work & pertinent test results  Airway Mallampati: II  TM Distance: >3 FB Neck ROM: Full    Dental no notable dental hx.    Pulmonary COPD, former smoker LLL nodule   Pulmonary exam normal breath sounds clear to auscultation       Cardiovascular hypertension, Normal cardiovascular exam+ dysrhythmias Atrial Fibrillation  Rhythm:Regular Rate:Normal  Hx of PE   Hx of Cardiac arrest in 1989   Neuro/Psych negative neurological ROS  negative psych ROS   GI/Hepatic Neg liver ROS, hiatal hernia,GERD  ,,  Endo/Other  negative endocrine ROSdiabetes    Renal/GU negative Renal ROS  negative genitourinary   Musculoskeletal negative musculoskeletal ROS (+)    Abdominal   Peds negative pediatric ROS (+)  Hematology negative hematology ROS (+)   Anesthesia Other Findings   Reproductive/Obstetrics negative OB ROS                              Anesthesia Physical Anesthesia Plan  ASA: 3  Anesthesia Plan: General   Post-op Pain Management:    Induction: Intravenous  PONV Risk Score and Plan: 2 and Ondansetron and Dexamethasone  Airway Management Planned: Oral ETT  Additional Equipment:   Intra-op Plan:   Post-operative Plan: Extubation in OR  Informed Consent: I have reviewed the patients History and Physical, chart, labs and discussed the procedure including the risks, benefits and alternatives for the proposed anesthesia with the patient or authorized representative who has indicated his/her understanding and acceptance.     Dental advisory given  Plan Discussed with: CRNA  Anesthesia Plan Comments:          Anesthesia Quick Evaluation

## 2023-03-06 NOTE — Transfer of Care (Signed)
Immediate Anesthesia Transfer of Care Note  Patient: Marcell Barlow  Procedure(s) Performed: VIDEO BRONCHOSCOPY WITH ENDOBRONCHIAL NAVIGATION PLACEMENT OF FUDUCIAL  Patient Location: PACU  Anesthesia Type:General  Level of Consciousness: awake, alert , oriented, and patient cooperative  Airway & Oxygen Therapy: Patient Spontanous Breathing and Patient connected to face mask oxygen  Post-op Assessment: Report given to RN, Post -op Vital signs reviewed and stable, Post -op Vital signs reviewed and unstable, Anesthesiologist notified, Patient moving all extremities, Patient moving all extremities X 4, and Patient able to stick tongue midline  Post vital signs: Reviewed and stable  Last Vitals:  Vitals Value Taken Time  BP 153/66 03/06/23 1230  Temp    Pulse 74 03/06/23 1231  Resp 18 03/06/23 1231  SpO2 97 % 03/06/23 1231  Vitals shown include unfiled device data.  Last Pain:  Vitals:   03/06/23 1024  PainSc: 0-No pain         Complications: No notable events documented.

## 2023-03-06 NOTE — Op Note (Signed)
[name], [name] MEDICAL RECORD NO: [id number] ACCOUNT NO: [id number] DATE OF BIRTH: 15-Sep-1950 FACILITY: [facility] LOCATION: [facility] PHYSICIAN: Salvatore Decent. Dorris Fetch, MD  Operative Report   DATE OF PROCEDURE: 03/06/2023  PREOPERATIVE DIAGNOSIS:  Left lower lung nodule.  POSTOPERATIVE DIAGNOSIS:  Left lower lung nodule.  PROCEDURE:  Electromagnetic navigational bronchoscopy with needle aspirations, brushings, and transbronchial biopsies and fiducial placement.  SURGEON:  Salvatore Decent. Dorris Fetch, MD  ASSISTANT:  None.  ANESTHESIA:  General.  FINDINGS:  Quick prep showed mostly blood.  No definitive diagnosis possible.  Deferred to permanent pathology.  Airways friable and bled easily.  CLINICAL NOTE:  Kimberly Walters is a 73 year old woman with a history of tobacco abuse and COPD.  She presented with dyspnea and workup eventually revealed a 2.2 x 1.7 cm spiculated nodule in the superior segment of the left lower lobe.  On PET CT, the nodule was intensely hypermetabolic with an SUV of 8.  Pulmonary function testing showed that she was not a candidate for surgical resection.  She was advised to undergo navigational bronchoscopy and fiducial placement and then referral to Radiation Oncology for consideration of stereotactic radiation.  The indications, risks, benefits, and alternatives were discussed in detail with the patient.  She understood and accepted the risks and agreed to proceed.  OPERATIVE NOTE:  Kimberly Walters was brought to the operating room on 03/06/2023.  Planning for the navigational bronchoscopy was done prior to induction.  She had induction of general anesthesia and was intubated.  Sequential compression devices were placed on the calves for DVT prophylaxis.  The probes were placed on the patient.  A Bair Hugger was placed for active warming.  A timeout was performed.  Flexible fiberoptic bronchoscopy was performed via the endotracheal tube.  It revealed normal endobronchial anatomy  with no endobronchial lesions to the level of the subsegmental bronchi.  There were thick clear secretions, which were irrigated with saline and cleared.  Of note, her airways were very friable and bled even with minimal suction or contact with the bronchoscope.  The locatable guide for navigation was placed.  Registration was performed.  There was good correlation of the video and virtual bronchoscopy.  The bronchoscope was directed to the left lower lobe bronchus and the superior segmental bronchus was cannulated with the locatable guide.  The guide was directed into the appropriate subsegmental bronchus and advanced to within a centimeter and a half of the nodule.  Local registration was performed and the catheter was repositioned.  It was an 1.3 cm from the center of the lesion with good alignment, but not perfectly centered.  Sampling then was performed.  All sampling was done with fluoroscopy.  Three needle aspirations were performed followed by three needle brushings.  While those were being examined, multiple biopsies were obtained.  Touch prep was done on one of the samples.  All of those samples showed primarily blood with no definitive diagnosis being possible.  The catheter was repositioned into a slightly different angle and the sampling process was repeated, but again those samples were nondiagnostic as well.  The catheter was once again repositioned this time within 5 mm of the center of the nodule and the sampling process was again repeated.  Again, mostly blood was noted on the specimens.  A fiducial was placed at the site.  The locatable guide was removed.  The locatable guide was removed.  The superior segmental bronchus was irrigated with dilute epinephrine solution several times and ultimately the return was  clear and there was no ongoing bleeding.  The total fluoroscopy time was 270 seconds and the total dose was 115.18 mGy.  The patient was extubated in the operating room and taken to the  post-anesthetic care unit in good condition.   PUS D: 03/06/2023 3:19:43 pm T: 03/06/2023 6:37:00 pm  JOB: [id number]/ [id number]

## 2023-03-06 NOTE — Brief Op Note (Signed)
03/06/2023  12:40 PM  PATIENT:  Kimberly Walters  73 y.o. female  PRE-OPERATIVE DIAGNOSIS:  Left Lower Lobe LUNG NODULE  POST-OPERATIVE DIAGNOSIS:  Left Lower Lobe LUNG NODULE  PROCEDURE:  Procedure(s): VIDEO BRONCHOSCOPY WITH ENDOBRONCHIAL NAVIGATION (N/A) PLACEMENT OF FUDUCIAL (N/A) Needle aspirations, brushings and transbronchial biopsies  SURGEON:  Surgeons and Role:    * Loreli Slot, MD - Primary  PHYSICIAN ASSISTANT:   ASSISTANTS: none   ANESTHESIA:   general  EBL:  minimal   BLOOD ADMINISTERED:none  DRAINS: none   LOCAL MEDICATIONS USED:  NONE  SPECIMEN:  Source of Specimen:  LLL nodule  DISPOSITION OF SPECIMEN:  PATHOLOGY  COUNTS:  NO endoscopic  TOURNIQUET:  * No tourniquets in log *  DICTATION: .Other Dictation: Dictation Number -  PLAN OF CARE: Discharge to home after PACU  PATIENT DISPOSITION:  PACU - hemodynamically stable.   Delay start of Pharmacological VTE agent (>24hrs) due to surgical blood loss or risk of bleeding: not applicable

## 2023-03-07 ENCOUNTER — Encounter (HOSPITAL_COMMUNITY): Payer: Self-pay | Admitting: Thoracic Surgery (Cardiothoracic Vascular Surgery)

## 2023-03-07 LAB — CYTOLOGY - NON PAP

## 2023-03-07 LAB — SURGICAL PATHOLOGY

## 2023-03-07 NOTE — Telephone Encounter (Signed)
I spoke with Mitch with [employer] and he is going to check on this 02 order to see if they finally have what they need to process

## 2023-03-07 NOTE — Telephone Encounter (Signed)
Mitch with [employer] just called me back and he was able to see Dr. Thurston Hole notes with the 02 documentation. Elease Hashimoto is aware to process the 02 order

## 2023-03-10 ENCOUNTER — Telehealth: Payer: Self-pay | Admitting: Physician Assistant

## 2023-03-11 ENCOUNTER — Telehealth: Payer: Self-pay | Admitting: Internal Medicine

## 2023-03-11 NOTE — Telephone Encounter (Signed)
I sent message to [employer] about O2 - but patient is spitting up blood - routing to Triage

## 2023-03-11 NOTE — Telephone Encounter (Signed)
PT calling about 2 issues:  She states [employer] delivered a large home 02 unit and she ordered a POC. She has had past issues with this order and Mitch (the rep from [employer]) was pulled into the loop to assist. I asked the PCC's and they said it is a Triage issue. She does not want to speak to [employer]. They said they only deliver what they are told to. If you look at the order it states the following:  Frequency (continuous with stationary and portable oxygen unit needed OR only at night): (PLEASE CHOOSE ONE) continuous with stationary and portable unit needed  Also, she had a procedure and is still spitting up a bit of blood. Is that normal? States it is nickel and dime sized. Her # is [phone]

## 2023-03-12 NOTE — Telephone Encounter (Signed)
I called and spoke with Kimberly Walters. Kimberly Walters states her o2 order was fixed and she did receive what she needed. I informed Kimberly Walters that she needs to f/u with Dr Dorris Fetch since he preformed the bronch. Kimberly Walters verbalized understanding. NFN

## 2023-03-13 ENCOUNTER — Telehealth: Payer: Self-pay

## 2023-03-13 ENCOUNTER — Other Ambulatory Visit: Payer: Medicare HMO

## 2023-03-13 ENCOUNTER — Ambulatory Visit: Payer: Medicare HMO | Admitting: Internal Medicine

## 2023-03-13 NOTE — Telephone Encounter (Signed)
Notified the pt daughter of completion  of FMLA paper work and she wants to pick up here at [facility].

## 2023-03-18 NOTE — Progress Notes (Unsigned)
 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 female returns clinic today for a follow-up visit.  The patient saw Dr. Arbutus Ped on 02/24/2023.  At that point in time, the patient had a suspicious nodule in the left upper lobe. The patient has COPD and uses portable oxygen with certain activities such as shopping.  If the patient is not a candidate for surgery, then Dr. Arbutus Ped was going to consider her for SBRT.  He also arranged for repeat pulmonary function test.    She had PFTs but she is not a candidate for major resection per surgery. Dr. Arbutus Ped recommended biopsy under the care of Dr. Dorris Fetch which was performed on 03/06/2023.  The final pathology was negative for malignant cells.  The patient states that she was reported to have blood during the procedure and that Dr. Dorris Fetch let her know that even if the final pathology was negative, that he would still recommend radiation due the lesion being suspicious.   When she denies any major changes in her health since she was last seen. She is expected to see her PCP next week to follow up on some concerns related to her gallbladder. She denies any fever, chills, night sweats, or unexplained weight loss.  She continues to report stable dyspnea on exertion. She wears supplemental oxygen with exertion. She states her cough is improving with quitting smoking. Denies any hemoptysis or chest pain.  Denies any vomiting, diarrhea, or constipation.  She is not taking potassium but her potassium is a little high. She does eat a lot of bananas. She is here today for evaluation and to review her  pathology and discuss the next steps.  MEDICAL HISTORY: Past Medical History:  Diagnosis Date   Anticoagulant-induced bleeding (HCC)    Aortic atherosclerosis (HCC)    Bereavement 11/16/2018   Cardiac arrest (HCC) 1989   pt states her heart stopped and they had to shock her heart and restarted it (w/ Dr. Elsie Lincoln)   Clotting disorder [facility])    Diabetes mellitus without complication (HCC)    Emphysema, unspecified (HCC)    Factor V Leiden mutation (HCC)    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    GERD (gastroesophageal reflux disease)    Gross hematuria    History of hiatal hernia    Hyperlipidemia    Hypertension    Mitral valve prolapse    Nodule of lower lobe of left lung    Paroxysmal atrial fibrillation with RVR (HCC)    Pleural effusion on left    Post-thrombotic syndrome of left lower extremity    Primary hypercoagulable state (HCC)    Pulmonary emboli (HCC)    Pyelonephritis 2021   Upper respiratory infection, viral 04/02/2021    ALLERGIES:  has no known allergies.  MEDICATIONS:  Current Outpatient Medications  Medication Sig Dispense Refill   acetaminophen (TYLENOL) 500 MG tablet Take 500 mg by mouth every 6 (six) hours as needed for moderate pain.     albuterol (VENTOLIN HFA) 108 (90 Base) MCG/ACT inhaler Inhale 2 puffs into the lungs every 6 (six) hours as needed for wheezing or shortness of breath. 8 g 0   atorvastatin (LIPITOR)  20 MG tablet Take 1 tablet (20 mg total) by mouth daily. 90 tablet 3   diltiazem (DILACOR XR) 120 MG 24 hr capsule Take 1 capsule (120 mg total) by mouth daily. 90 capsule 3   lisinopril (ZESTRIL) 20 MG tablet Take 1 tablet by mouth once daily (Patient taking differently: Take 20 mg by mouth in the morning and at bedtime.) 90 tablet 0   pantoprazole (PROTONIX) 40 MG tablet Take 1 tablet (40 mg total) by mouth daily. 30 tablet 1   rivaroxaban (XARELTO) 20 MG TABS tablet Take 1 tablet (20 mg total) by mouth daily with supper. 90 tablet 0    SYMBICORT 80-4.5 MCG/ACT inhaler Inhale 2 puffs by mouth twice daily 11 g 1   Tiotropium Bromide Monohydrate (SPIRIVA RESPIMAT) 2.5 MCG/ACT AERS INHALE 2 SPRAY(S) BY MOUTH ONCE DAILY AT 8 PM (Patient taking differently: Inhale 2 puffs into the lungs daily.) 4 g 1   Vitamin D, Cholecalciferol, 25 MCG (1000 UT) TABS Take 1 tablet by mouth once daily 90 tablet 0   No current facility-administered medications for this visit.    SURGICAL HISTORY:  Past Surgical History:  Procedure Laterality Date   BIOPSY  07/19/2020   Procedure: BIOPSY;  Surgeon: Napoleon Form, MD;  Location: [facility] ENDOSCOPY;  Service: Endoscopy;;   CARDIAC CATHETERIZATION     pt states she had one in 1989 and 1996 w/ Dr. Elsie Lincoln   COLONOSCOPY     ESOPHAGOGASTRODUODENOSCOPY (EGD) WITH PROPOFOL N/A 07/19/2020   Procedure: ESOPHAGOGASTRODUODENOSCOPY (EGD) WITH PROPOFOL;  Surgeon: Napoleon Form, MD;  Location: [facility] ENDOSCOPY;  Service: Endoscopy;  Laterality: N/A;   FUDUCIAL PLACEMENT N/A 03/06/2023   Procedure: PLACEMENT OF FUDUCIAL;  Surgeon: Loreli Slot, MD;  Location: [facility] OR;  Service: Thoracic;  Laterality: N/A;   HYSTERECTOMY ABDOMINAL WITH SALPINGO-OOPHORECTOMY  1975   LYMPH NODE BIOPSY Right 1983   Neck   VIDEO BRONCHOSCOPY WITH ENDOBRONCHIAL NAVIGATION N/A 03/06/2023   Procedure: VIDEO BRONCHOSCOPY WITH ENDOBRONCHIAL NAVIGATION;  Surgeon: Loreli Slot, MD;  Location: [facility] OR;  Service: Thoracic;  Laterality: N/A;    REVIEW OF SYSTEMS:   Review of Systems  Constitutional: Negative for appetite change, chills, fatigue, fever and unexpected weight change.  HENT: Negative for mouth sores, nosebleeds, sore throat and trouble swallowing.   Eyes: Negative for eye problems and icterus.  Respiratory: Positive for dyspnea on exertion. Negative for cough (improving) and wheezing.   Cardiovascular: Negative for chest pain and leg swelling.  Gastrointestinal: Positive for occasional mild nausea. Negative for  abdominal pain, constipation, diarrhea, and vomiting.  Genitourinary: Negative for bladder incontinence, difficulty urinating, dysuria, frequency and hematuria.   Musculoskeletal: Negative for back pain, gait problem, neck pain and neck stiffness.  Skin: Negative for itching and rash.  Neurological: Negative for dizziness, extremity weakness, gait problem, headaches, light-headedness and seizures.  Hematological: Negative for adenopathy. Does not bruise/bleed easily.  Psychiatric/Behavioral: Negative for confusion, depression and sleep disturbance. The patient is not nervous/anxious.     PHYSICAL EXAMINATION:  Last menstrual period 06/21/1973.  ECOG PERFORMANCE STATUS: 1  Physical Exam  Constitutional: Oriented to person, place, and time and well-developed, well-nourished, and in no distress.  HENT:  Head: Normocephalic and atraumatic.  Mouth/Throat: Oropharynx is clear and moist. No oropharyngeal exudate.  Eyes: Conjunctivae are normal. Right eye exhibits no discharge. Left eye exhibits no discharge. No scleral icterus.  Neck: Normal range of motion. Neck supple.  Cardiovascular: Normal rate, regular rhythm, normal heart sounds and intact distal  pulses.   Pulmonary/Chest: Effort normal. Quiet breath sounds bilaterally. No respiratory distress. No wheezes. No rales.  Abdominal: Soft. Bowel sounds are normal. Exhibits no distension and no mass. There is no tenderness.  Musculoskeletal: Normal range of motion. Exhibits no edema.  Lymphadenopathy:    No cervical adenopathy.  Neurological: Alert and oriented to person, place, and time. Exhibits normal muscle tone. Gait normal. Coordination normal.  Skin: Skin is warm and dry. No rash noted. Not diaphoretic. No erythema. No pallor.  Psychiatric: Mood, memory and judgment normal.  Vitals reviewed.  LABORATORY DATA: Lab Results  Component Value Date   WBC 7.8 03/03/2023   HGB 14.6 03/03/2023   HCT 44.3 03/03/2023   MCV 93.7 03/03/2023    PLT 227 03/03/2023      Chemistry      Component Value Date/Time   NA 141 03/03/2023 1145   NA 143 08/02/2021 1518   K 4.6 03/03/2023 1145   CL 106 03/03/2023 1145   CO2 26 03/03/2023 1145   BUN 17 03/03/2023 1145   BUN 19 08/02/2021 1518   CREATININE 0.90 03/03/2023 1145   CREATININE 1.03 (H) 02/24/2023 0827      Component Value Date/Time   CALCIUM 9.9 03/03/2023 1145   ALKPHOS 59 03/03/2023 1145   AST 24 03/03/2023 1145   AST 14 (L) 02/24/2023 0827   ALT 20 03/03/2023 1145   ALT 17 02/24/2023 0827   BILITOT 1.3 (H) 03/03/2023 1145   BILITOT 0.6 02/24/2023 0827       RADIOGRAPHIC STUDIES:  CT Super D Chest Wo Contrast Result Date: 03/15/2023 CLINICAL DATA:  Pre EMB and biopsy. Hypermetabolic lesion on prior PET-CT. EXAM: CT CHEST WITHOUT CONTRAST TECHNIQUE: Multidetector CT imaging of the chest was performed using thin slice collimation for electromagnetic bronchoscopy planning purposes, without intravenous contrast. RADIATION DOSE REDUCTION: This exam was performed according to the departmental dose-optimization program which includes automated exposure control, adjustment of the mA and/or kV according to patient size and/or use of iterative reconstruction technique. COMPARISON:  PET-CT 01/10/2023 FINDINGS: Cardiovascular: The heart is normal size. No pericardial effusion. The aorta is normal in caliber. Stable scattered atherosclerotic calcification. Stable scattered coronary artery calcifications. Mediastinum/Nodes: Small scattered mediastinal and hilar lymph nodes are stable. No mass or overt adenopathy. The esophagus is unremarkable. Lungs/Pleura: Spiculated left lower lobe lung lesion measures approximately 19 x 19 mm on image 72/4. This previously measured 18 x 14 mm. Findings consistent with a primary lung neoplasm. Stable underlying emphysematous changes and pulmonary scarring. Stable right middle lobe atelectasis or scarring. No new pulmonary lesions or worrisome  pulmonary nodules. Stable scattered perifissural nodules consistent with benign lymph nodes. Upper Abdomen: No significant upper abdominal findings. Stable vascular calcifications. No hepatic or adrenal gland lesions are identified. Musculoskeletal: No breast masses, supraclavicular or axillary adenopathy. Stable T11 and L1 compression fractures. IMPRESSION: 1. Spiculated left lower lobe lung lesion has enlarged slightly since the prior PET-CT. Findings consistent with a primary lung neoplasm. 2. No findings for mediastinal or hilar adenopathy. 3. Stable underlying emphysematous changes and pulmonary scarring. 4. Stable remote T11 and L1 compression fractures. Electronically Signed   By: Rudie Meyer M.D.   On: 03/15/2023 19:37   DG C-ARM BRONCHOSCOPY Result Date: 03/06/2023 C-ARM BRONCHOSCOPY: Fluoroscopy was utilized by the requesting physician.  No radiographic interpretation.     ASSESSMENT/PLAN:  This is a very pleasant 73 year old Caucasian female with suspicious stage Ia non-small cell lung cancer.  However her biopsy was negative.  She  presented with a left upper lobe pulmonary nodule.  The patient was seen by Dr. Dorris Fetch.  The patient would not be a candidate for resection given her pulmonary function.  The patient was seen with Dr. Arbutus Ped.  Dr. Arbutus Ped  recommended SBRT since the lesion remains highly suspicious.  The patient is in agreement with this.   I have placed a referral to radiation oncology.  We will see her back for follow-up visit in 4 months for evaluation.  I will arrange for repeat CT scan of the chest a few days prior to her follow-up visit.  She was advised to avoid potassium rich food. She is seeing her PCP next week for her gallbladder. She will have her labs rechecked at her upcoming appointment.   The patient was advised to call immediately if she has any concerning symptoms in the interval. The patient voices understanding of current disease status and  treatment options and is in agreement with the current care plan. All questions were answered. The patient knows to call the clinic with any problems, questions or concerns. We can certainly see the patient much sooner if necessary  No orders of the defined types were placed in this encounter.    Kimberly Sampley L Ashleah Valtierra, PA-C 03/18/23  ADDENDUM: Hematology/Oncology Attending: I had a face-to-face encounter with the patient today.  I reviewed her records, lab, scan and recommended her care plan.  This is a very pleasant 73 years old white female with highly suspicious stage Ia non-small cell lung cancer presented with a spiculated left upper lobe lung nodule and biopsy performed by Dr. Dorris Fetch was negative for malignancy but the patient has hypermetabolic activity on the PET scan.  I had a lengthy discussion with the patient and her family today about her current disease stage, prognosis and treatment options. I strongly recommend for the patient to see radiation oncology for evaluation and discussion of curative SBRT. We will see her back for follow-up visit in 4 months for evaluation and repeat CT scan of the chest for restaging of her disease. The patient was advised to call immediately if she has any other concerning symptoms in the interval. The total time spent in the appointment was 30 minutes. Disclaimer: This note was dictated with voice recognition software. Similar sounding words can inadvertently be transcribed and may be missed upon review. Lajuana Matte, MD

## 2023-03-20 ENCOUNTER — Inpatient Hospital Stay: Payer: Medicare HMO | Admitting: Physician Assistant

## 2023-03-20 ENCOUNTER — Inpatient Hospital Stay: Payer: Medicare HMO

## 2023-03-20 VITALS — BP 142/80 | HR 69 | Temp 97.6°F | Resp 17 | Wt 180.0 lb

## 2023-03-20 DIAGNOSIS — R911 Solitary pulmonary nodule: Secondary | ICD-10-CM | POA: Diagnosis not present

## 2023-03-20 DIAGNOSIS — Z9981 Dependence on supplemental oxygen: Secondary | ICD-10-CM | POA: Diagnosis not present

## 2023-03-20 DIAGNOSIS — C3490 Malignant neoplasm of unspecified part of unspecified bronchus or lung: Secondary | ICD-10-CM | POA: Insufficient documentation

## 2023-03-20 DIAGNOSIS — C3432 Malignant neoplasm of lower lobe, left bronchus or lung: Secondary | ICD-10-CM | POA: Insufficient documentation

## 2023-03-20 DIAGNOSIS — C3492 Malignant neoplasm of unspecified part of left bronchus or lung: Secondary | ICD-10-CM | POA: Diagnosis not present

## 2023-03-20 LAB — CBC WITH DIFFERENTIAL (CANCER CENTER ONLY)
Abs Immature Granulocytes: 0.05 10*3/uL (ref 0.00–0.07)
Basophils Absolute: 0.1 10*3/uL (ref 0.0–0.1)
Basophils Relative: 1 %
Eosinophils Absolute: 0.2 10*3/uL (ref 0.0–0.5)
Eosinophils Relative: 3 %
HCT: 42.2 % (ref 36.0–46.0)
Hemoglobin: 13.9 g/dL (ref 12.0–15.0)
Immature Granulocytes: 1 %
Lymphocytes Relative: 19 %
Lymphs Abs: 1.6 10*3/uL (ref 0.7–4.0)
MCH: 31.2 pg (ref 26.0–34.0)
MCHC: 32.9 g/dL (ref 30.0–36.0)
MCV: 94.8 fL (ref 80.0–100.0)
Monocytes Absolute: 0.7 10*3/uL (ref 0.1–1.0)
Monocytes Relative: 8 %
Neutro Abs: 5.7 10*3/uL (ref 1.7–7.7)
Neutrophils Relative %: 68 %
Platelet Count: 213 10*3/uL (ref 150–400)
RBC: 4.45 MIL/uL (ref 3.87–5.11)
RDW: 13 % (ref 11.5–15.5)
WBC Count: 8.3 10*3/uL (ref 4.0–10.5)
nRBC: 0 % (ref 0.0–0.2)

## 2023-03-20 LAB — CMP (CANCER CENTER ONLY)
ALT: 29 U/L (ref 0–44)
AST: 17 U/L (ref 15–41)
Albumin: 4.3 g/dL (ref 3.5–5.0)
Alkaline Phosphatase: 73 U/L (ref 38–126)
Anion gap: 5 (ref 5–15)
BUN: 21 mg/dL (ref 8–23)
CO2: 32 mmol/L (ref 22–32)
Calcium: 10.1 mg/dL (ref 8.9–10.3)
Chloride: 104 mmol/L (ref 98–111)
Creatinine: 1 mg/dL (ref 0.44–1.00)
GFR, Estimated: 59 mL/min — ABNORMAL LOW (ref >60)
Glucose, Bld: 161 mg/dL — ABNORMAL HIGH (ref 70–99)
Potassium: 5.3 mmol/L — ABNORMAL HIGH (ref 3.5–5.1)
Sodium: 141 mmol/L (ref 135–145)
Total Bilirubin: 0.7 mg/dL (ref 0.0–1.2)
Total Protein: 6.9 g/dL (ref 6.5–8.1)

## 2023-03-21 ENCOUNTER — Other Ambulatory Visit: Payer: Self-pay | Admitting: Family Medicine

## 2023-03-21 DIAGNOSIS — J439 Emphysema, unspecified: Secondary | ICD-10-CM

## 2023-03-23 NOTE — Progress Notes (Unsigned)
 Cardiology Office Note    Date:  03/24/2023  ID:  Kimberly Walters, DOB 27-May-1950, MRN [id number] PCP:  Avanell Shackleton, NP-C  Cardiologist:  Reatha Harps, MD  Electrophysiologist:  None   Chief Complaint: Follow up for palpitations   History of Present Illness: .    Kimberly Walters is a 73 y.o. female with visit-pertinent history of COPD, lunge CA, HTN, HLD, PE on Xarelto.  Echo on 02/02/2021 indicated LVEF of 55 to 60%, no RWMA, G1 DD, RV function was normal, there were no significant valvular abnormalities.  First established care with Dr. Flora Lipps on 01/01/23 for evaluation of palpitations.  At her appointment she reported increased shortness of breath with activity and increase in heart rate which she had noted for the prior 6 months.  She had recently been diagnosed with lung cancer.  She noted significant emotional stress due to family losses which she felt was affecting her heart.  She also reported poor sleep.  It was noted that she has a history of blood clots and was on Xarelto for this.  Patient reported a history of smoking since she was 65 with several attempts to quit.  She reported a history of ventricular tachycardia in 1988 and 1989 for which she underwent for heart catheterizations but no cause was found.  She denied any chest pain at that time.  Cardiac monitor showed an average heart rate of 81 bpm ranging from 45 to 211 bpm, predominant underlying rhythm was sinus rhythm.  She had 5 runs of nonsustained ventricular tachycardia, run with the fastest interval lasting 6 beats with a max rate of 211 bpm, longest lasting 6 beats with an average rate of 110 bpm.  She had 7 runs of supraventricular tachycardia she had frequent PVCs with a 5.0% burden.  She was started on diltiazem 120 mg extended release daily.  Today she presents for follow-up.  She reports that she is doing well. She feels her palpitations have improved since starting on diltiazem.  2 weeks ago she was started on  oxygen, she notes significant improvement in how she is feeling with this.  She reports that she is on 3 L nasal cannula.  Yesterday she was able to walk through multiple stores without becoming short of breath.  She reports that she quit smoking in December.  She does note some occasional lower extremity edema at the end of the day that improves overnight, related to varicose veins.  She denies chest pain, orthopnea, PND, presyncope or syncope.  She notes she is soon to start on radiation for lung cancer.  Labwork independently reviewed: 03/20/2023: Sodium 141, potassium 5.3, creatinine 1.0, AST 17, ALT 29, hemoglobin 13.9, hematocrit 42.2 ROS: .   Today she denies chest pain, lower extremity edema, fatigue, palpitations, melena, hematuria, hemoptysis, diaphoresis, weakness, presyncope, syncope, orthopnea, and PND.  All other systems are reviewed and otherwise negative. Studies Reviewed: [name]    EKG:  EKG is not ordered today.   CV Studies:  Cardiac Studies & Procedures   ______________________________________________________________________________________________     ECHOCARDIOGRAM  ECHOCARDIOGRAM COMPLETE BUBBLE STUDY 02/02/2021  Narrative ECHOCARDIOGRAM REPORT    Patient Name:   Kimberly Walters Date of Exam: 02/02/2021 Medical Rec #:  [id number]     Height:       64.0 in Accession #:    [id number]    Weight:       154.5 lb Date of Birth:  January 27, 1950     BSA:  1.753 m Patient Age:    70 years      BP:           147/81 mmHg Patient Gender: F             HR:           70 bpm. Exam Location:  Inpatient  Procedure: 2D Echo  Indications:    Dyspnea on exertion  History:        Patient has prior history of Echocardiogram examinations, most recent 07/15/2019. COPD; Risk Factors:Hypertension.  Sonographer:    Eduard Roux Referring Phys: [id number] CAROLYN GUILLOUD  IMPRESSIONS   1. Left ventricular ejection fraction, by estimation, is 55 to 60%. The left ventricle has  normal function. The left ventricle has no regional wall motion abnormalities. Left ventricular diastolic parameters are consistent with Grade I diastolic dysfunction (impaired relaxation). 2. Right ventricular systolic function is normal. The right ventricular size is normal. Tricuspid regurgitation signal is inadequate for assessing PA pressure. 3. The mitral valve is normal in structure. Trivial mitral valve regurgitation. 4. The aortic valve is tricuspid. Aortic valve regurgitation is trivial. No aortic stenosis is present. 5. The inferior vena cava is normal in size with greater than 50% respiratory variability, suggesting right atrial pressure of 3 mmHg. 6. Agitated saline contrast bubble study was negative, with no evidence of any interatrial shunt.  Comparison(s): Compared to prior TTE in 06/2019, the LVEF appears slightly better at ~55% (previously 50-55%). Otherwise, there is no significant change.  FINDINGS Left Ventricle: Left ventricular ejection fraction, by estimation, is 55 to 60%. The left ventricle has normal function. The left ventricle has no regional wall motion abnormalities. The left ventricular internal cavity size was normal in size. There is no left ventricular hypertrophy. Left ventricular diastolic parameters are consistent with Grade I diastolic dysfunction (impaired relaxation).  Right Ventricle: The right ventricular size is normal. No increase in right ventricular wall thickness. Right ventricular systolic function is normal. Tricuspid regurgitation signal is inadequate for assessing PA pressure.  Left Atrium: Left atrial size was normal in size.  Right Atrium: Right atrial size was normal in size.  Pericardium: There is no evidence of pericardial effusion.  Mitral Valve: The mitral valve is normal in structure. Trivial mitral valve regurgitation.  Tricuspid Valve: The tricuspid valve is normal in structure. Tricuspid valve regurgitation is trivial.  Aortic  Valve: The aortic valve is tricuspid. Aortic valve regurgitation is trivial. No aortic stenosis is present. Aortic valve peak gradient measures 7.5 mmHg.  Pulmonic Valve: The pulmonic valve was normal in structure. Pulmonic valve regurgitation is trivial.  Aorta: The aortic root and ascending aorta are structurally normal, with no evidence of dilitation.  Venous: The inferior vena cava is normal in size with greater than 50% respiratory variability, suggesting right atrial pressure of 3 mmHg.  IAS/Shunts: The atrial septum is grossly normal. Agitated saline contrast bubble study was negative, with no evidence of any interatrial shunt.   LEFT VENTRICLE PLAX 2D LVIDd:         5.30 cm   Diastology LVIDs:         3.60 cm   LV e' medial:    5.25 cm/s LV PW:         1.20 cm   LV E/e' medial:  11.9 LV IVS:        1.20 cm   LV e' lateral:   6.75 cm/s LVOT diam:     2.00 cm   LV E/e'  lateral: 9.3 LV SV:         58 LV SV Index:   33 LVOT Area:     3.14 cm   RIGHT VENTRICLE          IVC RV Basal diam:  2.90 cm  IVC diam: 2.20 cm TAPSE (M-mode): 2.3 cm  LEFT ATRIUM             Index        RIGHT ATRIUM           Index LA diam:        3.40 cm 1.94 cm/m   RA Area:     15.10 cm LA Vol (A2C):   32.5 ml 18.54 ml/m  RA Volume:   43.80 ml  24.98 ml/m LA Vol (A4C):   38.4 ml 21.90 ml/m LA Biplane Vol: 38.6 ml 22.02 ml/m AORTIC VALVE                 PULMONIC VALVE AV Area (Vmax): 2.09 cm     PV Vmax:       0.78 m/s AV Vmax:        136.50 cm/s  PV Peak grad:  2.4 mmHg AV Peak Grad:   7.5 mmHg LVOT Vmax:      90.90 cm/s LVOT Vmean:     50.400 cm/s LVOT VTI:       0.184 m  AORTA Ao Root diam: 3.60 cm Ao Asc diam:  3.50 cm  MITRAL VALVE MV Area (PHT): 3.03 cm     SHUNTS MV Decel Time: 250 msec     Systemic VTI:  0.18 m MV E velocity: 62.60 cm/s   Systemic Diam: 2.00 cm MV A velocity: 105.00 cm/s MV E/A ratio:  0.60  Laurance Flatten MD Electronically signed by Laurance Flatten  MD Signature Date/Time: 02/02/2021/12:42:38 PM    Final    MONITORS  LONG TERM MONITOR (3-14 DAYS) 01/16/2023  Narrative Patch Wear Time:  7 days and 6 hours (2024-12-14T12:42:09-498 to 2024-12-21T19:15:52-0500)  Patient had a min HR of 45 bpm (sinus bradycardia), max HR of 211 bpm (6 beats of non-sustained ventricular tachycardia, 2.5 second duration), and avg HR of 81 bpm (normal sinus rhythm). Predominant underlying rhythm was Sinus Rhythm.  5 non-sustained Ventricular Tachycardia runs occurred, the run with the fastest interval lasting 6 beats (2.5 second duration) with a max rate of 211 bpm, the longest lasting 6 beats (3.6 second duration) with an avg rate of 110 bpm. 7 Supraventricular Tachycardia runs occurred, the run with the fastest interval lasting 8 beats (3.8 second duration) with a max rate of 179 bpm (avg 129 bpm); the run with the fastest interval was also the longest. Isolated SVEs were rare (<1.0%), SVE Couplets were rare (<1.0%), and SVE Triplets were rare (<1.0%). Isolated VEs were frequent (5.0%, 31949), VE Couplets were rare (<1.0%, 1439), and VE Triplets were rare (<1.0%, 76). Ventricular Bigeminy and Trigeminy were present.  Impression: Brief non-sustained ventricular tachycardia was present (5 episodes in 7 days; longest duration 3.6 seconds). Brief supraventricular tachycardia detected (7 episodes in 7 days; longest duration 3.8 seconds). Frequent PVCs (5.0% burden).  Gerri Spore T. Flora Lipps, MD, [facility]  [facility] [address] [place], [place] [address] [phone] 9:08 PM       ______________________________________________________________________________________________        Current Reported Medications:.    Current Meds  Medication Sig   atorvastatin (LIPITOR) 20 MG tablet Take 1 tablet (20 mg total)  by mouth daily.   diltiazem (DILACOR XR) 120 MG 24 hr capsule Take 1 capsule (120 mg total) by mouth daily.   lisinopril  (ZESTRIL) 20 MG tablet Take 1 tablet by mouth once daily (Patient taking differently: Take 20 mg by mouth in the morning and at bedtime.)   pantoprazole (PROTONIX) 40 MG tablet Take 1 tablet (40 mg total) by mouth daily.   rivaroxaban (XARELTO) 20 MG TABS tablet Take 1 tablet (20 mg total) by mouth daily with supper.   SYMBICORT 80-4.5 MCG/ACT inhaler Inhale 2 puffs by mouth twice daily   Tiotropium Bromide Monohydrate (SPIRIVA RESPIMAT) 2.5 MCG/ACT AERS INHALE 2 SPRAY(S) BY MOUTH ONCE DAILY AT  8PM   Vitamin D, Cholecalciferol, 25 MCG (1000 UT) TABS Take 1 tablet by mouth once daily   Physical Exam:    VS:  BP 132/74   Pulse 67   Ht 5\' 4"  (1.626 m)   Wt 182 lb (82.6 kg)   LMP 06/21/1973   SpO2 96%   BMI 31.24 kg/m    Wt Readings from Last 3 Encounters:  03/24/23 182 lb (82.6 kg)  03/20/23 180 lb (81.6 kg)  03/06/23 175 lb (79.4 kg)    GEN: Well nourished, well developed in no acute distress NECK: No JVD; No carotid bruits CARDIAC: RRR, no murmurs, rubs, gallops RESPIRATORY:  Clear to auscultation without rales, wheezing or rhonchi  ABDOMEN: Soft, non-tender, non-distended EXTREMITIES:  No edema; No acute deformity   Asessement and Plan:.    Palpitations: Cardiac monitor in 12/2022 showed brief nonsustained ventricular tachycardia, brief supraventricular tachycardia and frequent PVCs with a 5.0% burden.  She was started on diltiazem 120 mg daily.  Today she reports improvement in her palpitations, reports she feels well overall.  Continue diltiazem 120 mg daily.  COPD/SOB/hx of tobacco use/Lung Cancer: Patient with history of lung cancer and COPD.  History of smoking since she was 16, recenlty quit.  On 03/06/2023 she underwent biopsy of lung nodule with Dr. Dorris Fetch, final pathology was negative for malignant cells, it is recommended that she undergo radiation due to lesion being suspicious.  Today she reports that she is doing well.  She has been started on 3 L nasal cannula with  improvement in her exercise tolerance.  She denies any chest pain.  She will continue to follow with her pulmonologist.  She reports that she stopped smoking in December, congratulated.  Hyperlipidemia: At office visit in 12/2022 she was started on Lipitor 20 mg daily.  Given history of diabetes her LDL goal is less than 70.  Check fasting lipid profile and LFTs.   Hypertension: Initial blood pressure today 142/80, on recheck was 132/74.  Encouraged patient to continue monitoring her blood pressure at home. Continue current antihypertensive regimen.    Disposition: F/u with Dr. Flora Lipps or Reather Littler, NP in 6 months or sooner if needed.   Signed, Rip Harbour, NP

## 2023-03-24 ENCOUNTER — Ambulatory Visit: Payer: Medicare HMO | Attending: Cardiology | Admitting: Cardiology

## 2023-03-24 ENCOUNTER — Encounter: Payer: Self-pay | Admitting: Cardiology

## 2023-03-24 VITALS — BP 132/74 | HR 67 | Ht 64.0 in | Wt 182.0 lb

## 2023-03-24 DIAGNOSIS — Z720 Tobacco use: Secondary | ICD-10-CM

## 2023-03-24 DIAGNOSIS — E782 Mixed hyperlipidemia: Secondary | ICD-10-CM

## 2023-03-24 DIAGNOSIS — R002 Palpitations: Secondary | ICD-10-CM | POA: Diagnosis not present

## 2023-03-24 DIAGNOSIS — R0602 Shortness of breath: Secondary | ICD-10-CM | POA: Diagnosis not present

## 2023-03-24 DIAGNOSIS — I10 Essential (primary) hypertension: Secondary | ICD-10-CM

## 2023-03-24 NOTE — Progress Notes (Signed)
 Thoracic Location of Tumor / Histology: Left lower Lobe pulmonary nodule   Patient presented in December with complaints of dyspnea with exertion and productive cough for about a month.   PET 01/10/2024: 18 mm spiculated nodule has intense metabolic activity with SUV max equal 8.0.  Biopsies of Left Lower Lobe Lung 03/06/2023    Past/Anticipated interventions by cardiothoracic surgery, if any:  Not a surgical candidate.   Past/Anticipated interventions by medical oncology, if any:  Cassie Heilingoetter PA / Dr. Arbutus Ped 03/20/2023 -Recommend SBRT since the lesion remains highly suspicious.   Tobacco/Marijuana/Snuff/ETOH use: Former smoker   Signs/Symptoms Weight changes, if any: Has gained a few pounds since she stopped smoking. Respiratory complaints, if any: Notes some SOB with increased activities.  She wears 3 liters when she is walking long distances. Hemoptysis, if any: Denies cough or hemoptysis. Pain issues, if any:  Gallbladder pains.  Denies chest pain, pressure or tightness.  SAFETY ISSUES: Prior radiation? No Pacemaker/ICD? No  Possible current pregnancy? Hysterectomy Is the patient on methotrexate? No  Current Complaints / other details:

## 2023-03-24 NOTE — Progress Notes (Signed)
 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   HISTORY OF PRESENT ILLNESS: Kimberly Walters is a 73 y.o. female seen at the request of Dr. Arbutus Ped for a concerning nodule in the left lower lobe suspicious for malignancy. The patient was The patient has a history of oxygen dependent COPD and had been diagnosed in 2021 with a pulmonary embolism and found to have factor V Leiden.  She was being evaluated with CT imaging given her history of smoking and the study performed on 12/27/2022 showed a spiculated nodule in the left lower lobe measuring 22 x 17 mm, there was also a tiny right lung nodule measuring 2 mm.  A PET scan to follow this on 01/10/2023 showed hypermetabolic activity in the left lower lobe nodule with an SUV of 8, on this study it measured 1.8 cm, no other evidence of hypermetabolic activity was appreciated in the lungs but she had several hypermetabolic foci in the subcutaneous tissue of the abdomen felt to be related to injection sites.  She underwent bronchoscopy on 03/06/2023, and cytology was negative for malignancy, though clinically she appears to have a malignant process, she is not a good candidate for surgical resection given her pulmonary function testing and oxygen dependence.  Rather, she is seen to consider definitive stereotactic body radiotherapy (SBRT).    PREVIOUS RADIATION THERAPY: {EXAM; YES/NO:19492::"No"}   PAST MEDICAL HISTORY:  Past Medical History:  Diagnosis Date   Anticoagulant-induced bleeding (HCC)    Aortic atherosclerosis (HCC)    Bereavement 11/16/2018   Cardiac arrest (HCC) 1989   pt states her heart stopped and they had to shock her heart and  restarted it (w/ Dr. Elsie Lincoln)   Clotting disorder [facility])    Diabetes mellitus without complication (HCC)    Emphysema, unspecified (HCC)    Factor V Leiden mutation (HCC)    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    GERD (gastroesophageal reflux disease)    Gross hematuria    History of hiatal hernia    Hyperlipidemia    Hypertension    Mitral valve prolapse    Nodule of lower lobe of left lung    Paroxysmal atrial fibrillation with RVR (HCC)    Pleural effusion on left    Post-thrombotic syndrome of left lower extremity    Primary hypercoagulable state (HCC)    Pulmonary emboli (HCC)    Pyelonephritis 2021   Upper respiratory infection, viral 04/02/2021       PAST SURGICAL HISTORY: Past Surgical History:  Procedure Laterality Date   BIOPSY  07/19/2020   Procedure: BIOPSY;  Surgeon: Napoleon Form, MD;  Location: [facility] ENDOSCOPY;  Service: Endoscopy;;   CARDIAC CATHETERIZATION     pt states she had one in 1989 and 1996 w/ Dr. Elsie Lincoln   COLONOSCOPY     ESOPHAGOGASTRODUODENOSCOPY (EGD) WITH PROPOFOL N/A 07/19/2020   Procedure: ESOPHAGOGASTRODUODENOSCOPY (EGD) WITH PROPOFOL;  Surgeon: Napoleon Form, MD;  Location: [facility] ENDOSCOPY;  Service: Endoscopy;  Laterality: N/A;   FUDUCIAL PLACEMENT N/A 03/06/2023   Procedure: PLACEMENT OF FUDUCIAL;  Surgeon: Loreli Slot, MD;  Location: [facility] OR;  Service: Thoracic;  Laterality: N/A;   HYSTERECTOMY  ABDOMINAL WITH SALPINGO-OOPHORECTOMY  1975   LYMPH NODE BIOPSY Right 1983   Neck   VIDEO BRONCHOSCOPY WITH ENDOBRONCHIAL NAVIGATION N/A 03/06/2023   Procedure: VIDEO BRONCHOSCOPY WITH ENDOBRONCHIAL NAVIGATION;  Surgeon: Loreli Slot, MD;  Location: [facility] OR;  Service: Thoracic;  Laterality: N/A;     FAMILY HISTORY:  Family History  Problem Relation Age of Onset   Diabetes Mother    Heart disease Mother    Cancer Mother        Uterine   Diabetes Father    CAD Father    Heart attack Father    Hypothyroidism Sister    Melanoma  Sister    Clotting disorder Sister    Stomach cancer Sister    Diabetes Sister    Diabetes Sister    Hypertension Sister    Hypothyroidism Sister    Diabetes Sister    Hypertension Sister    Hypothyroidism Sister    Cancer Sister    Cancer Sister    Diabetes Daughter    Diabetes Son    Diabetes Son    Breast cancer Half-Sister    Pancreatic cancer Nephew    Uterine cancer Niece    Ovarian cancer Other    Uterine cancer Other    Breast cancer Other    Colon cancer Neg Hx    Esophageal cancer Neg Hx      SOCIAL HISTORY:  reports that she quit smoking about 3 years ago. Her smoking use included cigarettes. She has a 27.5 pack-year smoking history. She has never used smokeless tobacco. She reports that she does not drink alcohol and does not use drugs. The patient is widowed and lives in [place].    ALLERGIES: Patient has no known allergies.   MEDICATIONS:  Current Outpatient Medications  Medication Sig Dispense Refill   acetaminophen (TYLENOL) 500 MG tablet Take 500 mg by mouth every 6 (six) hours as needed for moderate pain. (Patient not taking: Reported on 03/24/2023)     albuterol (VENTOLIN HFA) 108 (90 Base) MCG/ACT inhaler Inhale 2 puffs into the lungs every 6 (six) hours as needed for wheezing or shortness of breath. (Patient not taking: Reported on 03/24/2023) 8 g 0   atorvastatin (LIPITOR) 20 MG tablet Take 1 tablet (20 mg total) by mouth daily. 90 tablet 3   diltiazem (DILACOR XR) 120 MG 24 hr capsule Take 1 capsule (120 mg total) by mouth daily. 90 capsule 3   lisinopril (ZESTRIL) 20 MG tablet Take 1 tablet by mouth once daily (Patient taking differently: Take 20 mg by mouth in the morning and at bedtime.) 90 tablet 0   pantoprazole (PROTONIX) 40 MG tablet Take 1 tablet (40 mg total) by mouth daily. 30 tablet 1   rivaroxaban (XARELTO) 20 MG TABS tablet Take 1 tablet (20 mg total) by mouth daily with supper. 90 tablet 0   SYMBICORT 80-4.5 MCG/ACT inhaler Inhale 2 puffs  by mouth twice daily 11 g 1   Tiotropium Bromide Monohydrate (SPIRIVA RESPIMAT) 2.5 MCG/ACT AERS INHALE 2 SPRAY(S) BY MOUTH ONCE DAILY AT  8PM 4 g 0   Vitamin D, Cholecalciferol, 25 MCG (1000 UT) TABS Take 1 tablet by mouth once daily 90 tablet 0   No current facility-administered medications for this visit.     REVIEW OF SYSTEMS: On review of systems, the patient reports that *** is doing well overall. *** denies any chest pain, shortness of breath, cough, fevers, chills, night sweats, unintended weight changes. *** denies any bowel or  bladder disturbances, and denies abdominal pain, nausea or vomiting. *** denies any new musculoskeletal or joint aches or pains. A complete review of systems is obtained and is otherwise negative.     PHYSICAL EXAM:  Wt Readings from Last 3 Encounters:  03/24/23 182 lb (82.6 kg)  03/20/23 180 lb (81.6 kg)  03/06/23 175 lb (79.4 kg)   Temp Readings from Last 3 Encounters:  03/20/23 97.6 F (36.4 C) (Temporal)  03/06/23 98 F (36.7 C)  03/03/23 98 F (36.7 C)   BP Readings from Last 3 Encounters:  03/24/23 132/74  03/20/23 (!) 142/80  03/06/23 (!) 120/57   Pulse Readings from Last 3 Encounters:  03/24/23 67  03/20/23 69  03/06/23 (!) 58    /10  In general this is a well appearing *** in no acute distress. ***'s alert and oriented x4 and appropriate throughout the examination. Cardiopulmonary assessment is negative for acute distress and *** exhibits normal effort.     ECOG = ***  0 - Asymptomatic (Fully active, able to carry on all predisease activities without restriction)  1 - Symptomatic but completely ambulatory (Restricted in physically strenuous activity but ambulatory and able to carry out work of a light or sedentary nature. For example, light housework, office work)  2 - Symptomatic, <50% in bed during the day (Ambulatory and capable of all self care but unable to carry out any work activities. Up and about more than 50% of  waking hours)  3 - Symptomatic, >50% in bed, but not bedbound (Capable of only limited self-care, confined to bed or chair 50% or more of waking hours)  4 - Bedbound (Completely disabled. Cannot carry on any self-care. Totally confined to bed or chair)  5 - Death   Santiago Glad MM, Creech RH, Tormey DC, et al. [phone]). "Toxicity and response criteria of the [facility]". Am. Evlyn Clines. Oncol. 5 (6): 649-55    LABORATORY DATA:  Lab Results  Component Value Date   WBC 8.3 03/20/2023   HGB 13.9 03/20/2023   HCT 42.2 03/20/2023   MCV 94.8 03/20/2023   PLT 213 03/20/2023   Lab Results  Component Value Date   NA 141 03/20/2023   K 5.3 (H) 03/20/2023   CL 104 03/20/2023   CO2 32 03/20/2023   Lab Results  Component Value Date   ALT 29 03/20/2023   AST 17 03/20/2023   ALKPHOS 73 03/20/2023   BILITOT 0.7 03/20/2023      RADIOGRAPHY: CT Super D Chest Wo Contrast Result Date: 03/15/2023 CLINICAL DATA:  Pre EMB and biopsy. Hypermetabolic lesion on prior PET-CT. EXAM: CT CHEST WITHOUT CONTRAST TECHNIQUE: Multidetector CT imaging of the chest was performed using thin slice collimation for electromagnetic bronchoscopy planning purposes, without intravenous contrast. RADIATION DOSE REDUCTION: This exam was performed according to the departmental dose-optimization program which includes automated exposure control, adjustment of the mA and/or kV according to patient size and/or use of iterative reconstruction technique. COMPARISON:  PET-CT 01/10/2023 FINDINGS: Cardiovascular: The heart is normal size. No pericardial effusion. The aorta is normal in caliber. Stable scattered atherosclerotic calcification. Stable scattered coronary artery calcifications. Mediastinum/Nodes: Small scattered mediastinal and hilar lymph nodes are stable. No mass or overt adenopathy. The esophagus is unremarkable. Lungs/Pleura: Spiculated left lower lobe lung lesion measures approximately 19 x 19 mm on image  72/4. This previously measured 18 x 14 mm. Findings consistent with a primary lung neoplasm. Stable underlying emphysematous changes and pulmonary scarring. Stable right middle lobe atelectasis  or scarring. No new pulmonary lesions or worrisome pulmonary nodules. Stable scattered perifissural nodules consistent with benign lymph nodes. Upper Abdomen: No significant upper abdominal findings. Stable vascular calcifications. No hepatic or adrenal gland lesions are identified. Musculoskeletal: No breast masses, supraclavicular or axillary adenopathy. Stable T11 and L1 compression fractures. IMPRESSION: 1. Spiculated left lower lobe lung lesion has enlarged slightly since the prior PET-CT. Findings consistent with a primary lung neoplasm. 2. No findings for mediastinal or hilar adenopathy. 3. Stable underlying emphysematous changes and pulmonary scarring. 4. Stable remote T11 and L1 compression fractures. Electronically Signed   By: Rudie Meyer M.D.   On: 03/15/2023 19:37   DG C-ARM BRONCHOSCOPY Result Date: 03/06/2023 C-ARM BRONCHOSCOPY: Fluoroscopy was utilized by the requesting physician.  No radiographic interpretation.       IMPRESSION/PLAN: 1. Putative Stage IA2, cT1bN0M0, NSCLC of the LLL. Dr. Mitzi Hansen discusses the pathology findings and reviews the nature of *** Dr. Mitzi Hansen reviews that the standard of care is for surgical resection. However for patients who are not medical candidates to undergo surgery, or who choose to forgo surgery, stereotactic body radiotherapy (SBRT) is an appropriate alternative. We discussed the risks, benefits, short, and long term effects of radiotherapy, as well as the curative intent, and the patient is interested in proceeding. Dr. Mitzi Hansen discusses the delivery and logistics of radiotherapy and anticipates a course of ***    In a visit lasting *** minutes, greater than 50% of the time was spent face to face discussing the patient's condition, in preparation for the discussion,  and coordinating the patient's care.   The above documentation reflects my direct findings during this shared patient visit. Please see the separate note by Dr. Mitzi Hansen on this date for the remainder of the patient's plan of care.    Osker Mason, [facility]   **Disclaimer: This note was dictated with voice recognition software. Similar sounding words can inadvertently be transcribed and this note may contain transcription errors which may not have been corrected upon publication of note.**

## 2023-03-24 NOTE — Patient Instructions (Addendum)
 Medication Instructions:  No medication changes were made during today's visit. *If you need a refill on your cardiac medications before your next appointment, please call your pharmacy*   Lab Work: Return in 3 weeks for fasting labs If you have labs (blood work) drawn today and your tests are completely normal, you will receive your results only by: MyChart Message (if you have MyChart) OR A paper copy in the mail If you have any lab test that is abnormal or we need to change your treatment, we will call you to review the results.   Testing/Procedures: No procedures ordered today.    Follow-Up: At [facility], you and your health needs are our priority.  As part of our continuing mission to provide you with exceptional heart care, we have created designated Provider Care Teams.  These Care Teams include your primary Cardiologist (physician) and Advanced Practice Providers (APPs -  Physician Assistants and Nurse Practitioners) who all work together to provide you with the care you need, when you need it.  We recommend signing up for the patient portal called "MyChart".  Sign up information is provided on this After Visit Summary.  MyChart is used to connect with patients for Virtual Visits (Telemedicine).  Patients are able to view lab/test results, encounter notes, upcoming appointments, etc.  Non-urgent messages can be sent to your provider as well.   To learn more about what you can do with MyChart, go to [email or web address].    Your next appointment:   6 month(s)  Provider:   Dr. Lennie Odor     Other Instructions Thank you for choosing [facility]!            1st Floor: - Lobby - Registration  - Pharmacy  - Lab - Cafe   2nd Floor: - PV Lab - Diagnostic Testing (echo, CT, nuclear med)   3rd Floor: - Vacant   4th Floor: - TCTS (cardiothoracic surgery) - AFib Clinic - Structural Heart Clinic - Vascular Surgery  - Vascular  Ultrasound   5th Floor: - HeartCare Cardiology (general and EP) - Clinical Pharmacy for coumadin, hypertension, lipid, weight-loss medications, and med management appointments      Valet parking services will be available as well.

## 2023-03-25 ENCOUNTER — Ambulatory Visit
Admission: RE | Admit: 2023-03-25 | Discharge: 2023-03-25 | Disposition: A | Discharge status: Home / Self Care | Payer: Medicare HMO | Source: Ambulatory Visit | Attending: Radiation Oncology | Admitting: Radiation Oncology

## 2023-03-25 ENCOUNTER — Encounter: Payer: Self-pay | Admitting: Radiation Oncology

## 2023-03-25 VITALS — BP 144/75 | HR 70 | Temp 97.5°F | Resp 20 | Ht 64.0 in | Wt 177.6 lb

## 2023-03-25 DIAGNOSIS — Z86711 Personal history of pulmonary embolism: Secondary | ICD-10-CM | POA: Diagnosis not present

## 2023-03-25 DIAGNOSIS — I7090 Unspecified atherosclerosis: Secondary | ICD-10-CM | POA: Insufficient documentation

## 2023-03-25 DIAGNOSIS — Z803 Family history of malignant neoplasm of breast: Secondary | ICD-10-CM | POA: Diagnosis not present

## 2023-03-25 DIAGNOSIS — I480 Paroxysmal atrial fibrillation: Secondary | ICD-10-CM | POA: Diagnosis not present

## 2023-03-25 DIAGNOSIS — Z7951 Long term (current) use of inhaled steroids: Secondary | ICD-10-CM | POA: Diagnosis not present

## 2023-03-25 DIAGNOSIS — D6851 Activated protein C resistance: Secondary | ICD-10-CM | POA: Diagnosis not present

## 2023-03-25 DIAGNOSIS — J439 Emphysema, unspecified: Secondary | ICD-10-CM | POA: Insufficient documentation

## 2023-03-25 DIAGNOSIS — Z9981 Dependence on supplemental oxygen: Secondary | ICD-10-CM | POA: Diagnosis not present

## 2023-03-25 DIAGNOSIS — E785 Hyperlipidemia, unspecified: Secondary | ICD-10-CM | POA: Diagnosis not present

## 2023-03-25 DIAGNOSIS — K219 Gastro-esophageal reflux disease without esophagitis: Secondary | ICD-10-CM | POA: Insufficient documentation

## 2023-03-25 DIAGNOSIS — Z800 Family history of malignant neoplasm of digestive organs: Secondary | ICD-10-CM | POA: Diagnosis not present

## 2023-03-25 DIAGNOSIS — Z7901 Long term (current) use of anticoagulants: Secondary | ICD-10-CM | POA: Insufficient documentation

## 2023-03-25 DIAGNOSIS — Z87891 Personal history of nicotine dependence: Secondary | ICD-10-CM | POA: Insufficient documentation

## 2023-03-25 DIAGNOSIS — Z8674 Personal history of sudden cardiac arrest: Secondary | ICD-10-CM | POA: Diagnosis not present

## 2023-03-25 DIAGNOSIS — I10 Essential (primary) hypertension: Secondary | ICD-10-CM | POA: Diagnosis not present

## 2023-03-25 DIAGNOSIS — I341 Nonrheumatic mitral (valve) prolapse: Secondary | ICD-10-CM | POA: Diagnosis not present

## 2023-03-25 DIAGNOSIS — C3432 Malignant neoplasm of lower lobe, left bronchus or lung: Secondary | ICD-10-CM | POA: Diagnosis not present

## 2023-03-25 DIAGNOSIS — E119 Type 2 diabetes mellitus without complications: Secondary | ICD-10-CM | POA: Insufficient documentation

## 2023-03-25 DIAGNOSIS — Z79899 Other long term (current) drug therapy: Secondary | ICD-10-CM | POA: Insufficient documentation

## 2023-03-26 DIAGNOSIS — C3432 Malignant neoplasm of lower lobe, left bronchus or lung: Secondary | ICD-10-CM | POA: Diagnosis not present

## 2023-03-26 DIAGNOSIS — Z87891 Personal history of nicotine dependence: Secondary | ICD-10-CM | POA: Diagnosis not present

## 2023-03-27 ENCOUNTER — Ambulatory Visit
Admission: RE | Admit: 2023-03-27 | Discharge: 2023-03-27 | Disposition: A | Discharge status: Home / Self Care | Source: Ambulatory Visit | Attending: Radiation Oncology | Admitting: Radiation Oncology

## 2023-03-27 DIAGNOSIS — C3432 Malignant neoplasm of lower lobe, left bronchus or lung: Secondary | ICD-10-CM | POA: Insufficient documentation

## 2023-03-27 DIAGNOSIS — Z87891 Personal history of nicotine dependence: Secondary | ICD-10-CM | POA: Diagnosis not present

## 2023-03-27 DIAGNOSIS — Z510 Encounter for antineoplastic radiation therapy: Secondary | ICD-10-CM | POA: Insufficient documentation

## 2023-03-31 DIAGNOSIS — E782 Mixed hyperlipidemia: Secondary | ICD-10-CM | POA: Diagnosis not present

## 2023-03-31 DIAGNOSIS — R002 Palpitations: Secondary | ICD-10-CM | POA: Diagnosis not present

## 2023-03-31 DIAGNOSIS — I10 Essential (primary) hypertension: Secondary | ICD-10-CM | POA: Diagnosis not present

## 2023-03-31 DIAGNOSIS — R0602 Shortness of breath: Secondary | ICD-10-CM | POA: Diagnosis not present

## 2023-04-01 ENCOUNTER — Encounter: Payer: Self-pay | Admitting: Family Medicine

## 2023-04-01 ENCOUNTER — Ambulatory Visit (INDEPENDENT_AMBULATORY_CARE_PROVIDER_SITE_OTHER): Payer: Medicare HMO | Admitting: Family Medicine

## 2023-04-01 VITALS — BP 132/80 | HR 63 | Temp 97.6°F | Ht 64.0 in | Wt 180.0 lb

## 2023-04-01 DIAGNOSIS — I10 Essential (primary) hypertension: Secondary | ICD-10-CM

## 2023-04-01 DIAGNOSIS — D6851 Activated protein C resistance: Secondary | ICD-10-CM | POA: Diagnosis not present

## 2023-04-01 DIAGNOSIS — E1122 Type 2 diabetes mellitus with diabetic chronic kidney disease: Secondary | ICD-10-CM

## 2023-04-01 DIAGNOSIS — Z86711 Personal history of pulmonary embolism: Secondary | ICD-10-CM

## 2023-04-01 DIAGNOSIS — M810 Age-related osteoporosis without current pathological fracture: Secondary | ICD-10-CM | POA: Diagnosis not present

## 2023-04-01 DIAGNOSIS — E559 Vitamin D deficiency, unspecified: Secondary | ICD-10-CM | POA: Diagnosis not present

## 2023-04-01 DIAGNOSIS — I700 Atherosclerosis of aorta: Secondary | ICD-10-CM

## 2023-04-01 DIAGNOSIS — C3432 Malignant neoplasm of lower lobe, left bronchus or lung: Secondary | ICD-10-CM

## 2023-04-01 LAB — HEPATIC FUNCTION PANEL
ALT: 28 IU/L (ref 0–32)
AST: 18 IU/L (ref 0–40)
Albumin: 4.3 g/dL (ref 3.8–4.8)
Alkaline Phosphatase: 79 IU/L (ref 44–121)
Bilirubin Total: 0.5 mg/dL (ref 0.0–1.2)
Bilirubin, Direct: 0.18 mg/dL (ref 0.00–0.40)
Total Protein: 6.1 g/dL (ref 6.0–8.5)

## 2023-04-01 LAB — LIPID PANEL
Chol/HDL Ratio: 2.5 ratio (ref 0.0–4.4)
Cholesterol, Total: 121 mg/dL (ref 100–199)
HDL: 49 mg/dL (ref >39)
LDL Chol Calc (NIH): 51 mg/dL (ref 0–99)
Triglycerides: 118 mg/dL (ref 0–149)
VLDL Cholesterol Cal: 21 mg/dL (ref 5–40)

## 2023-04-01 NOTE — Progress Notes (Signed)
 Subjective:     Patient ID: Kimberly Walters, female    DOB: Jul 29, 1950, 73 y.o.   MRN: [id number]  Chief Complaint  Patient presents with   Medical Management of Chronic Issues    FASTING  7 week f/u blood sugars and lipids. Has not been checking BS    HPI  History of Present Illness          She is here to follow up on chronic health conditions including DM type 2. She has not been on medications. Preferred to control with diet.   A1c 7.1% 4 weeks ago.   Starts radiation therapy next week for lung nodule. Not surgery candidate.   Lipid panel checked yesterday by her cardiologist. She is on atorvastatin 20 mg daily  On Xarelto for multiple PE, DVT  She was on Eliquis and had a blood clot so she was switched to Xarelto.   Symbicort and spiriva in the AM and Symbicort at night.  She reports using oxygen when needed running errands.   States she is not taking pantoprazole since it did not seem to make a difference in bloating or abdominal pain.  She saw GI  Reports mild anxiety but declines medication   Health Maintenance Due  Topic Date Due   FOOT EXAM  Never done   Diabetic kidney evaluation - Urine ACR  11/16/2019   COVID-19 Vaccine (6 - 2024-25 season) 01/22/2023    Past Medical History:  Diagnosis Date   Anticoagulant-induced bleeding (HCC)    Aortic atherosclerosis (HCC)    Bereavement 11/16/2018   Cardiac arrest (HCC) 1989   pt states her heart stopped and they had to shock her heart and restarted it (w/ Dr. Elsie Lincoln)   Clotting disorder [facility])    Diabetes mellitus without complication (HCC)    Emphysema, unspecified (HCC)    Factor V Leiden mutation (HCC)    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    GERD (gastroesophageal reflux disease)    Gross hematuria    History of hiatal hernia    Hyperlipidemia    Hypertension    Mitral valve prolapse    Nodule of lower lobe of left lung    Paroxysmal atrial fibrillation with RVR (HCC)    Pleural effusion on left     Post-thrombotic syndrome of left lower extremity    Primary hypercoagulable state (HCC)    Pulmonary emboli (HCC)    Pyelonephritis 2021   Upper respiratory infection, viral 04/02/2021    Past Surgical History:  Procedure Laterality Date   BIOPSY  07/19/2020   Procedure: BIOPSY;  Surgeon: Napoleon Form, MD;  Location: [facility] END[facility]COPY;  Service: Endoscopy;;   CARDIAC CATHETERIZATION     pt states she had one in 1989 and 1996 w/ Dr. Elsie Lincoln   COLONOSCOPY     ESOPHAGOGASTRODUODENOSCOPY (EGD) WITH PROPOFOL N/A 07/19/2020   Procedure: ESOPHAGOGASTRODUODENOSCOPY (EGD) WITH PROPOFOL;  Surgeon: Napoleon Form, MD;  Location: [facility] ENDOSCOPY;  Service: Endoscopy;  Laterality: N/A;   FUDUCIAL PLACEMENT N/A 03/06/2023   Procedure: PLACEMENT OF FUDUCIAL;  Surgeon: Loreli Slot, MD;  Location: [facility] OR;  Service: Thoracic;  Laterality: N/A;   HYSTERECTOMY ABDOMINAL WITH SALPINGO-OOPHORECTOMY  1975   LYMPH NODE BIOPSY Right 1983   Neck   VIDEO BRONCHOSCOPY WITH ENDOBRONCHIAL NAVIGATION N/A 03/06/2023   Procedure: VIDEO BRONCHOSCOPY WITH ENDOBRONCHIAL NAVIGATION;  Surgeon: Loreli Slot, MD;  Location: [facility] OR;  Service: Thoracic;  Laterality: N/A;    Family History  Problem Relation Age  of Onset   Diabetes Mother    Heart disease Mother    Cancer Mother        Uterine   Diabetes Father    CAD Father    Heart attack Father    Hypothyroidism Sister    Melanoma Sister    Clotting disorder Sister    Stomach cancer Sister    Diabetes Sister    Diabetes Sister    Hypertension Sister    Hypothyroidism Sister    Diabetes Sister    Hypertension Sister    Hypothyroidism Sister    Cancer Sister    Cancer Sister    Diabetes Daughter    Diabetes Son    Diabetes Son    Breast cancer Half-Sister    Pancreatic cancer Nephew    Uterine cancer Niece    Ovarian cancer Other    Uterine cancer Other    Breast cancer Other    Colon cancer Neg Hx    Esophageal cancer Neg Hx      Social History   Socioeconomic History   Marital status: Widowed    Spouse name: Not on file   Number of children: 3   Years of education: Not on file   Highest education level: GED or equivalent  Occupational History   Occupation: Retired    Associate Professor: [employer]  Tobacco Use   Smoking status: Former    Current packs/day: 0.00    Average packs/day: 0.5 packs/day for 55.0 years (27.5 ttl pk-yrs)    Types: Cigarettes    Quit date: 07/01/2019    Years since quitting: 3.7   Smokeless tobacco: Never  Vaping Use   Vaping status: Never Used  Substance and Sexual Activity   Alcohol use: No   Drug use: No   Sexual activity: Yes    Birth control/protection: Surgical, Post-menopausal  Other Topics Concern   Not on file  Social History Narrative   Current Social History 09/12/2020      Patient lives with 2 sons and granddaughter in a one story home. There are 3 steps with handrail up to the entrance the patient uses.       Patient's method of transportation is personal car.      The highest level of education was GED      The patient currently retired from [employer].      Identified important Relationships are "my children, my grandchildren, my great-grandchildren, and my sisters"      Pets : dog and Brewing technologist / Fun: "read, watch movies, go walking, and just do things with the children."      Current Stressors: "dog"      Religious / Personal Beliefs: "Old fashioned Southern [employer]."       Pt lives with her 2 sons and granddaughter-2025            Social Drivers of Health   Financial Resource Strain: Medium Risk (01/29/2023)   Overall Financial Resource Strain (CARDIA)    Difficulty of Paying Living Expenses: Somewhat hard  Food Insecurity: No Food Insecurity (01/29/2023)   Hunger Vital Sign    Worried About Running Out of Food in the Last Year: Never true    Ran Out of Food in the Last Year: Never true  Transportation Needs: No Transportation Needs (01/29/2023)    PRAPARE - Administrator, Civil Service (Medical): No    Lack of Transportation (Non-Medical): No  Physical Activity: Inactive (01/29/2023)  Exercise Vital Sign    Days of Exercise per Week: 0 days    Minutes of Exercise per Session: 0 min  Stress: No Stress Concern Present (01/29/2023)   [employer] of Occupational Health - Occupational Stress Questionnaire    Feeling of Stress : Not at all  Social Connections: Moderately Isolated (01/29/2023)   Social Connection and Isolation Panel [NHANES]    Frequency of Communication with Friends and Family: More than three times a week    Frequency of Social Gatherings with Friends and Family: Three times a week    Attends Religious Services: 1 to 4 times per year    Active Member of Clubs or Organizations: No    Attends Banker Meetings: Never    Marital Status: Widowed  Intimate Partner Violence: Patient Unable To Answer (01/29/2023)   Humiliation, Afraid, Rape, and Kick questionnaire    Fear of Current or Ex-Partner: Patient unable to answer    Emotionally Abused: Patient unable to answer    Physically Abused: Patient unable to answer    Sexually Abused: Patient unable to answer    Outpatient Medications Prior to Visit  Medication Sig Dispense Refill   albuterol (VENTOLIN HFA) 108 (90 Base) MCG/ACT inhaler Inhale 2 puffs into the lungs every 6 (six) hours as needed for wheezing or shortness of breath. 8 g 0   atorvastatin (LIPITOR) 20 MG tablet Take 1 tablet (20 mg total) by mouth daily. 90 tablet 3   diltiazem (DILACOR XR) 120 MG 24 hr capsule Take 1 capsule (120 mg total) by mouth daily. 90 capsule 3   lisinopril (ZESTRIL) 20 MG tablet Take 1 tablet by mouth once daily (Patient taking differently: Take 20 mg by mouth in the morning and at bedtime.) 90 tablet 0   rivaroxaban (XARELTO) 20 MG TABS tablet Take 1 tablet (20 mg total) by mouth daily with supper. 90 tablet 0   SYMBICORT 80-4.5 MCG/ACT inhaler Inhale 2 puffs  by mouth twice daily 11 g 1   Tiotropium Bromide Monohydrate (SPIRIVA RESPIMAT) 2.5 MCG/ACT AERS INHALE 2 SPRAY(S) BY MOUTH ONCE DAILY AT  8PM 4 g 0   Vitamin D, Cholecalciferol, 25 MCG (1000 UT) TABS Take 1 tablet by mouth once daily 90 tablet 0   acetaminophen (TYLENOL) 500 MG tablet Take 500 mg by mouth every 6 (six) hours as needed for moderate pain. (Patient not taking: Reported on 03/24/2023)     pantoprazole (PROTONIX) 40 MG tablet Take 1 tablet (40 mg total) by mouth daily. (Patient not taking: Reported on 04/01/2023) 30 tablet 1   No facility-administered medications prior to visit.    No Known Allergies  Review of Systems  Constitutional:  Negative for chills and fever.  Respiratory:  Negative for shortness of breath.   Cardiovascular:  Negative for chest pain, palpitations and leg swelling.  Gastrointestinal:  Positive for abdominal pain. Negative for constipation, diarrhea, nausea and vomiting.  Genitourinary:  Negative for dysuria, frequency and urgency.  Neurological:  Negative for dizziness, focal weakness and headaches.  Psychiatric/Behavioral:  Negative for depression and suicidal ideas. The patient is nervous/anxious.        Objective:    Physical Exam Constitutional:      General: She is not in acute distress.    Appearance: She is not ill-appearing.  Eyes:     Extraocular Movements: Extraocular movements intact.     Conjunctiva/sclera: Conjunctivae normal.  Cardiovascular:     Rate and Rhythm: Normal rate.  Pulmonary:  Effort: Pulmonary effort is normal.  Musculoskeletal:     Cervical back: Normal range of motion and neck supple.  Skin:    General: Skin is warm and dry.  Neurological:     General: No focal deficit present.     Mental Status: She is alert and oriented to person, place, and time.  Psychiatric:        Mood and Affect: Mood normal.        Behavior: Behavior normal.        Thought Content: Thought content normal.      BP 132/80 (BP  Location: Left Arm, Patient Position: Sitting)   Pulse 63   Temp 97.6 F (36.4 C) (Temporal)   Ht 5\' 4"  (1.626 m)   Wt 180 lb (81.6 kg)   LMP 06/21/1973   SpO2 94%   BMI 30.90 kg/m  Wt Readings from Last 3 Encounters:  04/01/23 180 lb (81.6 kg)  03/25/23 177 lb 9.6 oz (80.6 kg)  03/24/23 182 lb (82.6 kg)       Assessment & Plan:   Problem List Items Addressed This Visit     Aortic atherosclerosis (HCC)   Continue statin therapy. Reviewed recent lipids which were much improved. Continue low fat diet      Factor V Leiden [facility])   Sees hematology. 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 Previously took alendronate.  Hx of fracture.  Consider more aggressive treatment once she agrees and gets a DEXA scan.      Type 2 diabetes mellitus with chronic kidney disease, without long-term current use of insulin (HCC) - Primary   Working on diet and cutting back on sugar and bread. Stopped soda. Prefers to hold off on medication. Last A1c 7.1% 4 wks ago      Vitamin D deficiency   Continue supplement        I am having Kielyn L. Kestner maintain her acetaminophen, albuterol, pantoprazole, atorvastatin, rivaroxaban, diltiazem, lisinopril, Vitamin D (Cholecalciferol), Symbicort, and Spiriva Respimat.  No orders of the defined types were placed in this encounter.

## 2023-04-01 NOTE — Assessment & Plan Note (Signed)
 Continue supplement.

## 2023-04-01 NOTE — Assessment & Plan Note (Signed)
 Sees hematology. Continue Xarelto

## 2023-04-01 NOTE — Assessment & Plan Note (Signed)
 Continue statin therapy. Reviewed recent lipids which were much improved. Continue low fat diet

## 2023-04-01 NOTE — Patient Instructions (Signed)
 Good luck with your upcoming treatment  Continue watching your carbohydrates (potatoes, bread, pasta and rice) and sugar intake.  I will see you back in June or sooner if you need me.  Let me know if you would like to get the bone density scan that we talked about and discuss treatment for your osteoporosis.

## 2023-04-01 NOTE — Assessment & Plan Note (Signed)
 She declines DEXA scan and treatment for osteoporosis for now. Previously took alendronate.  Hx of fracture.  Consider more aggressive treatment once she agrees and gets a DEXA scan.

## 2023-04-01 NOTE — Assessment & Plan Note (Signed)
 Reviewed cardiology notes. BP better controlled today

## 2023-04-01 NOTE — Assessment & Plan Note (Signed)
 Working on diet and cutting back on sugar and bread. Stopped soda. Prefers to hold off on medication. Last A1c 7.1% 4 wks ago

## 2023-04-02 ENCOUNTER — Telehealth: Payer: Self-pay

## 2023-04-02 NOTE — Telephone Encounter (Signed)
 Called patient advised of below they verbalized understanding.

## 2023-04-02 NOTE — Telephone Encounter (Signed)
-----   Message from Rip Harbour sent at 04/02/2023 10:57 AM EDT ----- Please let Ms. Rohm know that her cholesterol is well controlled. Her liver function is normal. Good results! Continue current medications and follow up as planned.

## 2023-04-03 ENCOUNTER — Other Ambulatory Visit: Payer: Self-pay | Admitting: Family Medicine

## 2023-04-03 DIAGNOSIS — I10 Essential (primary) hypertension: Secondary | ICD-10-CM

## 2023-04-04 DIAGNOSIS — Z510 Encounter for antineoplastic radiation therapy: Secondary | ICD-10-CM | POA: Diagnosis not present

## 2023-04-04 DIAGNOSIS — C3432 Malignant neoplasm of lower lobe, left bronchus or lung: Secondary | ICD-10-CM | POA: Diagnosis not present

## 2023-04-04 DIAGNOSIS — Z87891 Personal history of nicotine dependence: Secondary | ICD-10-CM | POA: Diagnosis not present

## 2023-04-08 ENCOUNTER — Telehealth: Payer: Self-pay | Admitting: Family Medicine

## 2023-04-08 DIAGNOSIS — I10 Essential (primary) hypertension: Secondary | ICD-10-CM

## 2023-04-08 NOTE — Telephone Encounter (Unsigned)
 Copied from CRM [id number]. Topic: Clinical - Prescription Issue >> Apr 08, 2023 11:26 AM Orinda Kenner C wrote: Reason for CRM: Patient needs clarification on Lisinopril dosage, patient states NP, Henson recommend on February 07, 2023 to take Lisinopril 20 mg 2 tablets daily. Patient states blood pressure is much better now with Lisinopril 40 mg. Patient just saw NP, [facility] 04/01/23. Patient is asking is she to take Lisinopril 20 mg or 40 mg? Patient will be out of medication tomorrow. [facility] told patient cannot pick up medication until April 25, 2023, they have on file Lisinopril 20 mg due to insurance. Please advise and call back at [phone].    [facility] [address] [place] - [address] [place] [address] [phone]:[phone]

## 2023-04-08 NOTE — Telephone Encounter (Signed)
 I do see the ov note in January where you increased lisinopril to 40 mg. Currently med list shows 20 mg, Ok to send in new dose to pharmacy since BP has been doing well on the 40 mg?

## 2023-04-09 ENCOUNTER — Other Ambulatory Visit: Payer: Self-pay

## 2023-04-09 ENCOUNTER — Ambulatory Visit
Admission: RE | Admit: 2023-04-09 | Discharge: 2023-04-09 | Disposition: A | Discharge status: Home / Self Care | Source: Ambulatory Visit | Attending: Radiation Oncology | Admitting: Radiation Oncology

## 2023-04-09 DIAGNOSIS — C3432 Malignant neoplasm of lower lobe, left bronchus or lung: Secondary | ICD-10-CM | POA: Diagnosis not present

## 2023-04-09 DIAGNOSIS — Z510 Encounter for antineoplastic radiation therapy: Secondary | ICD-10-CM | POA: Diagnosis not present

## 2023-04-09 LAB — RAD ONC ARIA SESSION SUMMARY
Course Elapsed Days: 0
Plan Fractions Treated to Date: 1
Plan Prescribed Dose Per Fraction: 12 Gy
Plan Total Fractions Prescribed: 5
Plan Total Prescribed Dose: 60 Gy
Reference Point Dosage Given to Date: 12 Gy
Reference Point Session Dosage Given: 12 Gy
Session Number: 1

## 2023-04-09 MED ORDER — LISINOPRIL 40 MG PO TABS: 40.0000 mg | ORAL_TABLET | Freq: Every day | ORAL | 0 refills | Status: DC

## 2023-04-09 NOTE — Telephone Encounter (Signed)
 Called pt and informed her we sent in new rx for lisinopril 40 mg and that pharmacy should fill that for her. Advised pt to please let us know if she has any troubles as I will contact pharmacy to handle if so. Pt verbalized understanding

## 2023-04-10 ENCOUNTER — Ambulatory Visit: Admitting: Radiation Oncology

## 2023-04-11 ENCOUNTER — Other Ambulatory Visit: Payer: Self-pay

## 2023-04-11 ENCOUNTER — Ambulatory Visit
Admission: RE | Admit: 2023-04-11 | Discharge: 2023-04-11 | Disposition: A | Discharge status: Home / Self Care | Source: Ambulatory Visit | Attending: Radiation Oncology | Admitting: Radiation Oncology

## 2023-04-11 DIAGNOSIS — C3432 Malignant neoplasm of lower lobe, left bronchus or lung: Secondary | ICD-10-CM | POA: Diagnosis not present

## 2023-04-11 DIAGNOSIS — Z510 Encounter for antineoplastic radiation therapy: Secondary | ICD-10-CM | POA: Diagnosis not present

## 2023-04-11 LAB — RAD ONC ARIA SESSION SUMMARY
Course Elapsed Days: 2
Plan Fractions Treated to Date: 2
Plan Prescribed Dose Per Fraction: 12 Gy
Plan Total Fractions Prescribed: 5
Plan Total Prescribed Dose: 60 Gy
Reference Point Dosage Given to Date: 24 Gy
Reference Point Session Dosage Given: 12 Gy
Session Number: 2

## 2023-04-14 ENCOUNTER — Other Ambulatory Visit: Payer: Self-pay | Admitting: Family Medicine

## 2023-04-14 ENCOUNTER — Ambulatory Visit
Admission: RE | Admit: 2023-04-14 | Discharge: 2023-04-14 | Disposition: A | Discharge status: Home / Self Care | Source: Ambulatory Visit | Attending: Radiation Oncology | Admitting: Radiation Oncology

## 2023-04-14 ENCOUNTER — Other Ambulatory Visit: Payer: Self-pay

## 2023-04-14 DIAGNOSIS — C3432 Malignant neoplasm of lower lobe, left bronchus or lung: Secondary | ICD-10-CM | POA: Diagnosis not present

## 2023-04-14 DIAGNOSIS — Z510 Encounter for antineoplastic radiation therapy: Secondary | ICD-10-CM | POA: Diagnosis not present

## 2023-04-14 LAB — RAD ONC ARIA SESSION SUMMARY
Course Elapsed Days: 5
Plan Fractions Treated to Date: 3
Plan Prescribed Dose Per Fraction: 12 Gy
Plan Total Fractions Prescribed: 5
Plan Total Prescribed Dose: 60 Gy
Reference Point Dosage Given to Date: 36 Gy
Reference Point Session Dosage Given: 12 Gy
Session Number: 3

## 2023-04-15 ENCOUNTER — Ambulatory Visit: Admitting: Radiation Oncology

## 2023-04-16 ENCOUNTER — Other Ambulatory Visit: Payer: Self-pay

## 2023-04-16 ENCOUNTER — Ambulatory Visit
Admission: RE | Admit: 2023-04-16 | Discharge: 2023-04-16 | Disposition: A | Discharge status: Home / Self Care | Source: Ambulatory Visit | Attending: Radiation Oncology | Admitting: Radiation Oncology

## 2023-04-16 DIAGNOSIS — Z510 Encounter for antineoplastic radiation therapy: Secondary | ICD-10-CM | POA: Diagnosis not present

## 2023-04-16 DIAGNOSIS — C3432 Malignant neoplasm of lower lobe, left bronchus or lung: Secondary | ICD-10-CM | POA: Diagnosis not present

## 2023-04-16 LAB — RAD ONC ARIA SESSION SUMMARY
Course Elapsed Days: 7
Plan Fractions Treated to Date: 4
Plan Prescribed Dose Per Fraction: 12 Gy
Plan Total Fractions Prescribed: 5
Plan Total Prescribed Dose: 60 Gy
Reference Point Dosage Given to Date: 48 Gy
Reference Point Session Dosage Given: 12 Gy
Session Number: 4

## 2023-04-18 ENCOUNTER — Ambulatory Visit
Admission: RE | Admit: 2023-04-18 | Discharge: 2023-04-18 | Disposition: A | Discharge status: Home / Self Care | Source: Ambulatory Visit | Attending: Radiation Oncology | Admitting: Radiation Oncology

## 2023-04-18 ENCOUNTER — Other Ambulatory Visit: Payer: Self-pay | Admitting: Family Medicine

## 2023-04-18 ENCOUNTER — Ambulatory Visit

## 2023-04-18 ENCOUNTER — Other Ambulatory Visit: Payer: Self-pay

## 2023-04-18 DIAGNOSIS — C3432 Malignant neoplasm of lower lobe, left bronchus or lung: Secondary | ICD-10-CM | POA: Diagnosis not present

## 2023-04-18 DIAGNOSIS — J439 Emphysema, unspecified: Secondary | ICD-10-CM

## 2023-04-18 DIAGNOSIS — Z510 Encounter for antineoplastic radiation therapy: Secondary | ICD-10-CM | POA: Diagnosis not present

## 2023-04-18 DIAGNOSIS — Z87891 Personal history of nicotine dependence: Secondary | ICD-10-CM | POA: Diagnosis not present

## 2023-04-18 LAB — RAD ONC ARIA SESSION SUMMARY
Course Elapsed Days: 9
Plan Fractions Treated to Date: 5
Plan Prescribed Dose Per Fraction: 12 Gy
Plan Total Fractions Prescribed: 5
Plan Total Prescribed Dose: 60 Gy
Reference Point Dosage Given to Date: 60 Gy
Reference Point Session Dosage Given: 12 Gy
Session Number: 5

## 2023-04-21 NOTE — Radiation Completion Notes (Addendum)
  Radiation Oncology         [phone] ________________________________  Name: Kimberly Walters MRN: [id number]  Date of Service: 04/18/2023  DOB: 08-Jul-1950  End of Treatment Note   Diagnosis: Putative Stage IA2, cT1bN0M0, NSCLC of the LLL   Intent: Curative     ==========DELIVERED PLANS==========  First Treatment Date: 2023-04-09 Last Treatment Date: 2023-04-18   Plan Name: Lung_L_SBRT Site: Lung, Left Technique: SBRT/SRT-IMRT Mode: Photon Dose Per Fraction: 12 Gy Prescribed Dose (Delivered / Prescribed): 60 Gy / 60 Gy Prescribed Fxs (Delivered / Prescribed): 5 / 5     ==========ON TREATMENT VISIT DATES========== 2023-04-09, 2023-04-14, 2023-04-11, 2023-04-16, 2023-04-16, 2023-04-18   See weekly On Treatment Notes in Epic for details in the Media tab (listed as Progress notes on the On Treatment Visit Dates listed above).The patient tolerated radiation. She developed fatigue and anticipated skin changes in the treatment field.   The patient will receive a call in about one month from the radiation oncology department. She will continue follow up with Dr. Marguerita Shih as well.      Shelvia Dick, PAC

## 2023-04-28 ENCOUNTER — Ambulatory Visit (INDEPENDENT_AMBULATORY_CARE_PROVIDER_SITE_OTHER): Admitting: Internal Medicine

## 2023-04-28 ENCOUNTER — Encounter: Payer: Self-pay | Admitting: Internal Medicine

## 2023-04-28 VITALS — BP 136/80 | HR 85 | Temp 98.2°F | Ht 64.0 in | Wt 182.0 lb

## 2023-04-28 DIAGNOSIS — J069 Acute upper respiratory infection, unspecified: Secondary | ICD-10-CM

## 2023-04-28 DIAGNOSIS — K209 Esophagitis, unspecified without bleeding: Secondary | ICD-10-CM | POA: Diagnosis not present

## 2023-04-28 MED ORDER — AMOXICILLIN-POT CLAVULANATE 875-125 MG PO TABS: 1.0000 | ORAL_TABLET | Freq: Two times a day (BID) | ORAL | 0 refills | Status: AC

## 2023-04-28 NOTE — Progress Notes (Signed)
 Subjective:    Patient ID: Kimberly Walters, female    DOB: Feb 16, 1950, 73 y.o.   MRN: [id number]      HPI Kimberly Walters is here for  Chief Complaint  Patient presents with   Sore Throat    Sore throat that started Friday night and got worse; Hurts when she swallows (hard to eat and swallow)    She is here for an acute visit for cold symptoms.   Her symptoms started Friday - 3 days ago  She is experiencing decreased appetite, chills, fatigue, subjective fever, mild nasal congestion, left ear pain, runny nose, sore throat-very sore throat, cough that is hard like she is having difficulty getting something up and headaches.   She has tried taking tylenol.   she has been gargling with salt water.     Completed radiation for lung cancer just over a week ago.   Medications and allergies reviewed with patient and updated if appropriate.  Current Outpatient Medications on File Prior to Visit  Medication Sig Dispense Refill   albuterol (VENTOLIN HFA) 108 (90 Base) MCG/ACT inhaler Inhale 2 puffs into the lungs every 6 (six) hours as needed for wheezing or shortness of breath. 8 g 0   diltiazem (DILACOR XR) 120 MG 24 hr capsule Take 1 capsule (120 mg total) by mouth daily. 90 capsule 3   lisinopril (ZESTRIL) 40 MG tablet Take 1 tablet (40 mg total) by mouth daily. 90 tablet 0   pantoprazole (PROTONIX) 40 MG tablet Take 1 tablet (40 mg total) by mouth daily. 30 tablet 1   rivaroxaban (XARELTO) 20 MG TABS tablet TAKE 1 TABLET BY MOUTH ONCE DAILY WITH SUPPER 90 tablet 1   SYMBICORT 80-4.5 MCG/ACT inhaler Inhale 2 puffs by mouth twice daily 11 g 1   Tiotropium Bromide Monohydrate (SPIRIVA RESPIMAT) 2.5 MCG/ACT AERS INHALE 2 SPRAY(S) BY MOUTH ONCE DAILY AT  8PM 4 g 1   Vitamin D, Cholecalciferol, 25 MCG (1000 UT) TABS Take 1 tablet by mouth once daily 90 tablet 0   acetaminophen (TYLENOL) 500 MG tablet Take 500 mg by mouth every 6 (six) hours as needed for moderate pain. (Patient not taking:  Reported on 04/28/2023)     atorvastatin (LIPITOR) 20 MG tablet Take 1 tablet (20 mg total) by mouth daily. 90 tablet 3   No current facility-administered medications on file prior to visit.    Review of Systems  Constitutional:  Positive for appetite change (decreased), chills and fatigue. Negative for fever (subjective fever.).  HENT:  Positive for congestion (mild), ear pain (left ear), rhinorrhea and sore throat. Negative for sinus pressure.   Respiratory:  Positive for cough (hard cough - felt like she could not get something up). Negative for choking, shortness of breath and wheezing.   Gastrointestinal:  Positive for abdominal pain (Epigastric) and nausea (a couple of times).       Has had increased burping, some reflux possibly  Musculoskeletal:  Negative for myalgias.  Neurological:  Positive for headaches. Negative for dizziness and light-headedness.       Objective:   Vitals:   04/28/23 1322  BP: 136/80  Pulse: 85  Temp: 98.2 F (36.8 C)  SpO2: 96%   BP Readings from Last 3 Encounters:  04/28/23 136/80  04/01/23 132/80  03/25/23 (!) 144/75   Wt Readings from Last 3 Encounters:  04/28/23 182 lb (82.6 kg)  04/01/23 180 lb (81.6 kg)  03/25/23 177 lb 9.6 oz (80.6 kg)   Body  mass index is 31.24 kg/m.    Physical Exam Constitutional:      General: She is not in acute distress.    Appearance: Normal appearance. She is not ill-appearing.  HENT:     Head: Normocephalic and atraumatic.     Right Ear: Tympanic membrane, ear canal and external ear normal.     Left Ear: Tympanic membrane, ear canal and external ear normal.     Mouth/Throat:     Mouth: Mucous membranes are moist.     Pharynx: Posterior oropharyngeal erythema (Mild) present. No oropharyngeal exudate.  Eyes:     Conjunctiva/sclera: Conjunctivae normal.  Cardiovascular:     Rate and Rhythm: Normal rate and regular rhythm.  Pulmonary:     Effort: Pulmonary effort is normal. No respiratory distress.      Breath sounds: Normal breath sounds. No wheezing or rales.  Musculoskeletal:     Cervical back: Neck supple. No tenderness.  Lymphadenopathy:     Cervical: No cervical adenopathy.  Skin:    General: Skin is warm and dry.  Neurological:     Mental Status: She is alert.            Assessment & Plan:    URI: Acute Symptoms started 3 days ago Rapid strep and COVID test negative Viral versus bacterial URI versus side effects from radiation Will go ahead and give her an antibiotic just in case this is a bacterial infection-Augmentin 875-125 mg twice daily x 7 days  Esophagitis: Similar symptoms suggestive of esophagitis She is concerned about gallbladder issue-has been evaluated in the past for this, but may still have some gallbladder issues Possible reflux/GERD Advised contacting radiation-oncology to see if this could be potential side effect of radiation

## 2023-04-28 NOTE — Patient Instructions (Addendum)
      Your covid and strept tests are negative    Medications changes include :   Augmentin twice daily x 7 days     Return if symptoms worsen or fail to improve.

## 2023-04-29 ENCOUNTER — Telehealth: Payer: Self-pay

## 2023-04-29 NOTE — Telephone Encounter (Signed)
 Patient called and LVM stating she is having some problems and they may be from radiation side effects.   Spoke with patient in regards to symptoms. Patient stated she saw her PCP yesterday because her throat was sore, coughing, and hurts to swallow.  PCP stated similar symptoms of possible esophagitis. PCP sent in Augmentin and advised follow up with radiation oncology. Covid and strep negative.   Patient stated that she forgot to mention that she is having some left sided pain that comes and goes. Pain is relieved with position changes and describes pain as sharp and short. Patient states that breathing is okay and uses O2 with exertion. Informed patient message will be sent to radiation oncology for review.

## 2023-05-14 ENCOUNTER — Other Ambulatory Visit: Payer: Self-pay | Admitting: Family Medicine

## 2023-05-14 DIAGNOSIS — E559 Vitamin D deficiency, unspecified: Secondary | ICD-10-CM

## 2023-05-16 NOTE — Progress Notes (Addendum)
  Radiation Oncology         [phone] ________________________________  Name: Kimberly Walters MRN: [id number]  Date of Service: 05/16/2023  DOB: [birth year]  Post Treatment Telephone Note  Diagnosis:  Putative Stage IA2, cT1bN0M0, NSCLC of the LLL (as documented in provider EOT note)  The patient was not available for call today. Voicemail left. Call marked complete.  The patient has scheduled follow up with her medical oncologist Dr. Marguerita Shih for ongoing care, and was encouraged to call if she develops concerns or questions regarding radiation.     Avery Bodo, LPN

## 2023-05-19 ENCOUNTER — Ambulatory Visit
Admission: RE | Admit: 2023-05-19 | Discharge: 2023-05-19 | Disposition: A | Discharge status: Home / Self Care | Source: Ambulatory Visit | Attending: Physician Assistant | Admitting: Physician Assistant

## 2023-05-27 ENCOUNTER — Other Ambulatory Visit: Payer: Self-pay | Admitting: Family Medicine

## 2023-06-24 ENCOUNTER — Other Ambulatory Visit: Payer: Self-pay | Admitting: Family Medicine

## 2023-06-24 DIAGNOSIS — J439 Emphysema, unspecified: Secondary | ICD-10-CM

## 2023-06-30 ENCOUNTER — Ambulatory Visit: Payer: Self-pay

## 2023-06-30 NOTE — Telephone Encounter (Signed)
 FYI Only or Action Required?: FYI only for provider  Patient was last seen in primary care on 04/28/2023 by Kimberly Dauphin, MD. Called Nurse Triage reporting Leg Swelling. Symptoms began several days ago. Interventions attempted: Rest, hydration, or home remedies. Symptoms are: unchanged.  Triage Disposition: See Physician Within 24 Hours  Patient/caregiver understands and will follow disposition?: Yes        Copied from CRM [id number]. Topic: Clinical - Red Word Triage >> Jun 30, 2023  9:13 AM Emylou G wrote: [facility] that prompted transfer to Nurse Triage: swelling cramps and pain in both legs - sore in the right leg/feet/swollen is worse Reason for Disposition  [1] MODERATE leg swelling (e.g., swelling extends up to knees) AND [2] new-onset or worsening  Answer Assessment - Initial Assessment Questions 1. ONSET: "When did the swelling start?" (e.g., minutes, hours, days)     thursday 2. LOCATION: "What part of the leg is swollen?"  "Are both legs swollen or just one leg?"     Both but right is worse 3. SEVERITY: "How bad is the swelling?" (e.g., localized; mild, moderate, severe)   - Localized: Small area of swelling localized to one leg.   - MILD pedal edema: Swelling limited to foot and ankle, pitting edema < 1/4 inch (6 mm) deep, rest and elevation eliminate most or all swelling.   - MODERATE edema: Swelling of lower leg to knee, pitting edema > 1/4 inch (6 mm) deep, rest and elevation only partially reduce swelling.   - SEVERE edema: Swelling extends above knee, facial or hand swelling present.      mod 4. REDNESS: "Does the swelling look red or infected?"     Redness above right ankle 5. PAIN: "Is the swelling painful to touch?" If Yes, ask: "How painful is it?"   (Scale 1-10; mild, moderate or severe)     2-3/10 right now but last night cramping was a 10 6. FEVER: "Do you have a fever?" If Yes, ask: "What is it, how was it measured, and when did it start?"      no 7. CAUSE:  "What do you think is causing the leg swelling?"     no 8. MEDICAL HISTORY: "Do you have a history of blood clots (e.g., DVT), cancer, heart failure, kidney disease, or liver failure?"     yes  10. OTHER SYMPTOMS: "Do you have any other symptoms?" (e.g., chest pain, difficulty breathing)       no  Protocols used: Leg Swelling and Edema-A-AH

## 2023-07-01 ENCOUNTER — Ambulatory Visit (INDEPENDENT_AMBULATORY_CARE_PROVIDER_SITE_OTHER): Admitting: Family Medicine

## 2023-07-01 ENCOUNTER — Encounter: Payer: Self-pay | Admitting: Family Medicine

## 2023-07-01 VITALS — BP 116/70 | HR 74 | Temp 98.8°F | Ht 64.0 in | Wt 185.0 lb

## 2023-07-01 DIAGNOSIS — D6851 Activated protein C resistance: Secondary | ICD-10-CM | POA: Diagnosis not present

## 2023-07-01 DIAGNOSIS — D6859 Other primary thrombophilia: Secondary | ICD-10-CM | POA: Insufficient documentation

## 2023-07-01 DIAGNOSIS — E1122 Type 2 diabetes mellitus with diabetic chronic kidney disease: Secondary | ICD-10-CM | POA: Diagnosis not present

## 2023-07-01 DIAGNOSIS — N1831 Chronic kidney disease, stage 3a: Secondary | ICD-10-CM | POA: Diagnosis not present

## 2023-07-01 DIAGNOSIS — I10 Essential (primary) hypertension: Secondary | ICD-10-CM

## 2023-07-01 DIAGNOSIS — M7989 Other specified soft tissue disorders: Secondary | ICD-10-CM | POA: Insufficient documentation

## 2023-07-01 DIAGNOSIS — Z7901 Long term (current) use of anticoagulants: Secondary | ICD-10-CM

## 2023-07-01 DIAGNOSIS — L03115 Cellulitis of right lower limb: Secondary | ICD-10-CM | POA: Diagnosis not present

## 2023-07-01 DIAGNOSIS — I480 Paroxysmal atrial fibrillation: Secondary | ICD-10-CM

## 2023-07-01 MED ORDER — DOXYCYCLINE HYCLATE 100 MG PO CAPS: 100.0000 mg | ORAL_CAPSULE | Freq: Two times a day (BID) | ORAL | 0 refills | Status: AC

## 2023-07-01 NOTE — Progress Notes (Signed)
 Acute Office Visit  Subjective:     Patient ID: Kimberly Walters, female    DOB: 1950/06/05, 73 y.o.   MRN: [id number]  Chief Complaint  Patient presents with   Leg Swelling    Since last Thursday intermittent swelling and cramping and ankle pain mostly in the right. Sunday had bilateral leg pain.  Swell more in the afternoons.elevation help some.      HPI Patient is in today for evaluation of right lower leg swelling for the last few days. Has tried elevating her legs and aleve with little relief.  States the area has been red, warm and tender.  Inquiring about HR that is not consistent when using pulse oximeter. Denies worsening SOB, chest pain, palpitations, fever, chills, abdominal pain, nausea, vomiting, diarrhea, other symptoms.   ROS Per HPI      Objective:    BP 116/70 (BP Location: Left Arm, Patient Position: Sitting)   Pulse 74   Temp 98.8 F (37.1 C) (Temporal)   Ht 5\' 4"  (1.626 m)   Wt 185 lb (83.9 kg)   LMP 06/21/1973   SpO2 94%   BMI 31.76 kg/m    Physical Exam Vitals and nursing note reviewed.  Constitutional:      General: She is not in acute distress.    Comments: Chronically ill-appearing  HENT:     Head: Normocephalic and atraumatic.     Right Ear: External ear normal.     Left Ear: External ear normal.     Nose: Nose normal.     Mouth/Throat:     Mouth: Mucous membranes are moist.     Pharynx: Oropharynx is clear.  Eyes:     Extraocular Movements: Extraocular movements intact.     Pupils: Pupils are equal, round, and reactive to light.  Cardiovascular:     Rate and Rhythm: Normal rate. Rhythm irregular.     Pulses: Normal pulses.     Heart sounds: Normal heart sounds.  Pulmonary:     Effort: Pulmonary effort is normal. No respiratory distress.     Breath sounds: Examination of the right-middle field reveals decreased breath sounds. Examination of the left-middle field reveals decreased breath sounds. Examination of the right-lower  field reveals decreased breath sounds. Examination of the left-lower field reveals decreased breath sounds. Decreased breath sounds present. No wheezing, rhonchi or rales.     Comments: On O2 at 3 LPM via nasal cannula Musculoskeletal:        General: No swelling or tenderness.     Cervical back: Normal range of motion.     Right lower leg: Edema present.     Left lower leg: No edema.  Lymphadenopathy:     Cervical: No cervical adenopathy.  Skin:    Comments: Right lateral lower leg swollen, pitting, tender, warm to touch, mildly erythematous. No obvious lesion, no discharge or bleeding noted  Neurological:     General: No focal deficit present.     Mental Status: She is alert and oriented to person, place, and time.  Psychiatric:        Mood and Affect: Mood normal.        Thought Content: Thought content normal.    No results found for any visits on 07/01/23. EKG: Indication: irregular rate Rate: 77 Interpretation: sinus rhythm with PVC Changes from previous: no significant       Assessment & Plan:   Primary hypertension -     CBC with Differential/Platelet -  Comprehensive metabolic panel with GFR  Type 2 diabetes mellitus with stage 3a chronic kidney disease, without long-term current use of insulin (HCC) -     Comprehensive metabolic panel with GFR  Factor V Leiden (HCC) -     CBC with Differential/Platelet -     Comprehensive metabolic panel with GFR  Leg swelling -     CBC with Differential/Platelet -     Comprehensive metabolic panel with GFR -     Brain natriuretic peptide -     D-dimer, quantitative  Long term (current) use of anticoagulants -     CBC with Differential/Platelet -     Comprehensive metabolic panel with GFR -     D-dimer, quantitative  Paroxysmal atrial fibrillation with RVR (HCC) -     CBC with Differential/Platelet -     Comprehensive metabolic panel with GFR -     D-dimer, quantitative -     EKG 12-Lead  Hypercoagulable state  (HCC) -     CBC with Differential/Platelet -     Comprehensive metabolic panel with GFR -     D-dimer, quantitative  Cellulitis of right lower extremity -     Doxycycline  Hyclate; Take 1 capsule (100 mg total) by mouth 2 (two) times daily for 7 days.  Dispense: 14 capsule; Refill: 0     Meds ordered this encounter  Medications   doxycycline  (VIBRAMYCIN ) 100 MG capsule    Sig: Take 1 capsule (100 mg total) by mouth 2 (two) times daily for 7 days.    Dispense:  14 capsule    Refill:  0    Return if symptoms worsen or fail to improve, for as scheduled with Antoine Bathe.  Wellington Half, FNP

## 2023-07-01 NOTE — Patient Instructions (Signed)
 I have sent in a prescription for doxycycline  for you to take twice a day for 7 days.  Take this medication with food as it can upset your stomach if you do not.  EKG shows no acute changes.  Follow-up with me for new or worsening symptoms.  Follow up with Antoine Bathe as scheduled.

## 2023-07-02 ENCOUNTER — Other Ambulatory Visit: Payer: Self-pay | Admitting: Family Medicine

## 2023-07-02 DIAGNOSIS — I10 Essential (primary) hypertension: Secondary | ICD-10-CM

## 2023-07-08 ENCOUNTER — Encounter (HOSPITAL_COMMUNITY): Payer: Self-pay

## 2023-07-08 ENCOUNTER — Ambulatory Visit (HOSPITAL_COMMUNITY)
Admission: RE | Admit: 2023-07-08 | Discharge: 2023-07-08 | Disposition: A | Discharge status: Home / Self Care | Source: Ambulatory Visit | Attending: Physician Assistant | Admitting: Physician Assistant

## 2023-07-08 DIAGNOSIS — R911 Solitary pulmonary nodule: Secondary | ICD-10-CM | POA: Diagnosis not present

## 2023-07-08 DIAGNOSIS — E1122 Type 2 diabetes mellitus with diabetic chronic kidney disease: Secondary | ICD-10-CM | POA: Diagnosis not present

## 2023-07-08 DIAGNOSIS — J439 Emphysema, unspecified: Secondary | ICD-10-CM | POA: Diagnosis not present

## 2023-07-08 DIAGNOSIS — C3490 Malignant neoplasm of unspecified part of unspecified bronchus or lung: Secondary | ICD-10-CM | POA: Diagnosis not present

## 2023-07-08 DIAGNOSIS — I700 Atherosclerosis of aorta: Secondary | ICD-10-CM | POA: Diagnosis not present

## 2023-07-08 LAB — POCT I-STAT CREATININE: Creatinine, Ser: 1.1 mg/dL — ABNORMAL HIGH (ref 0.44–1.00)

## 2023-07-08 MED ORDER — IOHEXOL 300 MG/ML  SOLN
75.0000 mL | Freq: Once | INTRAMUSCULAR | Status: AC | PRN
  Administered 2023-07-08: 75 mL via INTRAVENOUS

## 2023-07-08 MED ORDER — SODIUM CHLORIDE (PF) 0.9 % IJ SOLN
INTRAMUSCULAR | Status: AC
  Filled 2023-07-08: qty 50

## 2023-07-08 NOTE — Progress Notes (Signed)
 [facility] OFFICE PROGRESS NOTE  Kimberly Boby CROME, NP-C [address] 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 female returns to the clinic today for a follow-up visit accompanied by her sister. The patient saw Dr. Sherrod on 02/24/2023.   She had PFTs but she is not a candidate for major resection per surgery. Dr. Sherrod recommended biopsy under the care of Dr. Kerrin which was performed on 03/06/2023.  The final pathology was negative for malignant cells.  The patient states that she was reported to have blood during the procedure and that Dr. Kerrin let her know that even if the final pathology was negative, that he would still recommend radiation due the lesion being suspicious.  Therefore, she underwent SBRT. This was performed in March 2025. She tolerated this fairly well except fatigue and esophagitis. She called the clinic in April endorsing cough and sore throat and possible esophagitis. Her PCP ordered augmentin . Her symptoms have resolved since that time.   Otherwise, she denies any major changes in her health. She denies any fever, chills, night sweats, or unexplained weight loss.  She continues to report stable dyspnea on exertion. She states this is doing pretty good. She only needs her supplemental oxygen  only if exerting. With household activities, she does not require her oxygen . When she needs to wear her oxygen , she wears 3 L. She states her cough had resolved with quitting smoking. Denies any hemoptysis or chest pain.  Denies any nausea, vomiting, diarrhea, or constipation. She recently had a restaging CT scan performed.  She is here for evaluation and to review her scan results.    MEDICAL HISTORY: Past Medical History:  Diagnosis Date   Anticoagulant-induced bleeding (HCC)    Aortic atherosclerosis (HCC)    Bereavement 11/16/2018   Cardiac arrest (HCC) 1989   pt states her heart stopped and they had to shock her heart and restarted it (w/ Dr. 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  MEDICATIONS:  Current Outpatient Medications  Medication Sig Dispense Refill   acetaminophen  (TYLENOL ) 500 MG tablet Take 500 mg by mouth every 6 (six) hours as needed for moderate pain. (Patient not taking: Reported on 04/28/2023)     albuterol  (VENTOLIN  HFA) 108 (90 Base) MCG/ACT inhaler INHALE 2 PUFFS BY MOUTH EVERY 6 HOURS AS NEEDED FOR WHEEZING OR SHORTNESS OF BREATH 18 g 0   atorvastatin  (LIPITOR) 20 MG tablet Take  1 tablet (20 mg total) by mouth daily. 90 tablet 3   diltiazem  (DILACOR XR ) 120 MG 24 hr capsule Take 1 capsule (120 mg total) by mouth daily. 90 capsule 3   doxycycline  (VIBRAMYCIN ) 100 MG capsule Take 1 capsule (100 mg total) by mouth 2 (two) times daily for 7 days. 14 capsule 0   lisinopril  (ZESTRIL ) 40 MG tablet Take 1 tablet by mouth once daily 90 tablet 0   pantoprazole  (PROTONIX ) 40 MG tablet Take 1 tablet (40 mg total) by mouth daily. (Patient not taking: Reported on  07/01/2023) 30 tablet 1   rivaroxaban  (XARELTO ) 20 MG TABS tablet TAKE 1 TABLET BY MOUTH ONCE DAILY WITH SUPPER 90 tablet 1   SPIRIVA  RESPIMAT 2.5 MCG/ACT AERS INHALE 2 SPRAY(S) BY MOUTH ONCE DAILY AT  8PM 4 g 0   SYMBICORT  80-4.5 MCG/ACT inhaler Inhale 2 puffs by mouth twice daily 11 g 1   Vitamin D , Cholecalciferol, 25 MCG (1000 UT) TABS Take 1 tablet by mouth once daily 90 tablet 0   No current facility-administered medications for this visit.    SURGICAL HISTORY:  Past Surgical History:  Procedure Laterality Date   BIOPSY  07/19/2020   Procedure: BIOPSY;  Surgeon: Shila Gustav GAILS, MD;  Location: [facility];  Service: Endoscopy;;   CARDIAC CATHETERIZATION     pt states she had one in 1989 and 1996 w/ Dr. Lavon   COLONOSCOPY     ESOPHAGOGASTRODUODENOSCOPY (EGD) WITH PROPOFOL  N/A 07/19/2020   Procedure: ESOPHAGOGASTRODUODENOSCOPY (EGD) WITH PROPOFOL ;  Surgeon: Shila Gustav GAILS, MD;  Location: [facility];  Service: Endoscopy;  Laterality: N/A;   FUDUCIAL PLACEMENT N/A 03/06/2023   Procedure: PLACEMENT OF FUDUCIAL;  Surgeon: Kerrin Elspeth BROCKS, MD;  Location: [facility] OR;  Service: Thoracic;  Laterality: N/A;   HYSTERECTOMY ABDOMINAL WITH SALPINGO-OOPHORECTOMY  1975   LYMPH NODE BIOPSY Right 1983   Neck   VIDEO BRONCHOSCOPY WITH ENDOBRONCHIAL NAVIGATION N/A 03/06/2023   Procedure: VIDEO BRONCHOSCOPY WITH ENDOBRONCHIAL NAVIGATION;  Surgeon: Kerrin Elspeth BROCKS, MD;  Location: [facility] OR;  Service: Thoracic;  Laterality: N/A;    REVIEW OF SYSTEMS:   Review of Systems  Constitutional: Negative for appetite change, chills, fatigue, fever and unexpected weight change.  HENT: Negative for mouth sores, nosebleeds, sore throat and trouble swallowing.   Eyes: Negative for eye problems and icterus.  Respiratory: Positive for stable occasional dyspnea on exertion. Negative for cough and wheezing.   Cardiovascular: Negative for chest pain and leg swelling.  Gastrointestinal: Negative for  abdominal pain, nausea, constipation, diarrhea, and vomiting.  Genitourinary: Negative for bladder incontinence, difficulty urinating, dysuria, frequency and hematuria.   Musculoskeletal: Negative for back pain, gait problem, neck pain and neck stiffness.  Skin: Negative for itching and rash.  Neurological: Negative for dizziness, extremity weakness, gait problem, headaches, light-headedness and seizures.  Hematological: Negative for adenopathy. Does not bruise/bleed easily.  Psychiatric/Behavioral: Negative for confusion, depression and sleep disturbance. The patient is not nervous/anxious.     PHYSICAL EXAMINATION:  Last menstrual period 06/21/1973.  ECOG PERFORMANCE STATUS: 1  Physical Exam  Constitutional: Oriented to person, place, and time and well-developed, well-nourished, and in no distress.  HENT:  Head: Normocephalic and atraumatic.  Mouth/Throat: Oropharynx is clear and moist. No oropharyngeal exudate.  Eyes: Conjunctivae are normal. Right eye exhibits no discharge. Left eye exhibits no discharge. No scleral icterus.  Neck: Normal range of motion. Neck supple.  Cardiovascular: Normal rate, regular rhythm, normal heart sounds and intact distal pulses.   Pulmonary/Chest: Effort  normal. Quiet breath sounds bilaterally. No respiratory distress. No wheezes. No rales. On supplemental oxygen .  Abdominal: Soft. Bowel sounds are normal. Exhibits no distension and no mass. There is no tenderness.  Musculoskeletal: Normal range of motion. Exhibits no edema.  Lymphadenopathy:    No cervical adenopathy.  Neurological: Alert and oriented to person, place, and time. Exhibits normal muscle tone. Gait normal. Coordination normal.  Skin: Skin is warm and dry. No rash noted. Not diaphoretic. No erythema. No pallor.  Psychiatric: Mood, memory and judgment normal.  Vitals reviewed.  LABORATORY DATA: Lab Results  Component Value Date   WBC 8.3 03/20/2023   HGB 13.9 03/20/2023   HCT 42.2  03/20/2023   MCV 94.8 03/20/2023   PLT 213 03/20/2023      Chemistry      Component Value Date/Time   NA 141 03/20/2023 1033   NA 143 08/02/2021 1518   K 5.3 (H) 03/20/2023 1033   CL 104 03/20/2023 1033   CO2 32 03/20/2023 1033   BUN 21 03/20/2023 1033   BUN 19 08/02/2021 1518   CREATININE 1.10 (H) 07/08/2023 0804   CREATININE 1.00 03/20/2023 1033      Component Value Date/Time   CALCIUM  10.1 03/20/2023 1033   ALKPHOS 79 03/31/2023 0903   AST 18 03/31/2023 0903   AST 17 03/20/2023 1033   ALT 28 03/31/2023 0903   ALT 29 03/20/2023 1033   BILITOT 0.5 03/31/2023 0903   BILITOT 0.7 03/20/2023 1033       RADIOGRAPHIC STUDIES:  No results found.   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 However her biopsy was negative. She presented with a left upper lobe pulmonary nodule.   She underwent SBRT under the care of Dr. Dewey. Her last day of radiation was on 04/18/23  The patient was seen with Dr. Sherrod today.  Dr. Sherrod personally and independently reviewed the scan and discussed results with the patient today.  The scan showed no evidence of disease progression. The treated area has decreased in size.  Dr. Sherrod recommends she continue on observation with a restaging CT in 6 months.   We will see her back for labs and follow up visit in 6 months for evaluation and a repeat CT scan.   The patient was advised to call immediately if she has any concerning symptoms in the interval. The patient voices understanding of current disease status and treatment options and is in agreement with the current care plan. All questions were answered. The patient knows to call the clinic with any problems, questions or concerns. We can certainly see the patient much sooner if necessary   No orders of the defined types were placed in this encounter.    Skylar Flynt L Akasha Melena,  PA-C 07/08/23  ADDENDUM: Hematology/Oncology Attending: I had a face-to-face encounter with the patient today.  I reviewed her records, lab, scan and recommended her care plan.  This is a very pleasant 73 years old white female with suspicious stage Ia non-small cell lung cancer status post SBRT to the left lower lobe lung nodule on April 18, 2023.  She is currently on observation and she is feeling fine except for the baseline shortness of breath and she is currently on home oxygen . The patient had repeat CT scan of the chest performed recently.  I personally and independently reviewed the scan and discussed the result with the patient today.  Her scan showed improvement  in the left lower lobe nodule after the curative radiotherapy. I recommended for her to continue on observation with repeat CT scan of the chest in 6 months. The patient was advised to call immediately if she has any other concerning symptoms in the interval. The total time spent in the appointment was 20 minutes including review of chart and various tests results, discussions about plan of care and coordination of care plan . Disclaimer: This note was dictated with voice recognition software. Similar sounding words can inadvertently be transcribed and may be missed upon review. Sherrod MARLA Sherrod, MD

## 2023-07-09 ENCOUNTER — Other Ambulatory Visit: Payer: Self-pay | Admitting: Family Medicine

## 2023-07-09 ENCOUNTER — Encounter: Payer: Self-pay | Admitting: Family Medicine

## 2023-07-09 ENCOUNTER — Ambulatory Visit (INDEPENDENT_AMBULATORY_CARE_PROVIDER_SITE_OTHER): Admitting: Family Medicine

## 2023-07-09 VITALS — BP 126/80 | HR 76 | Temp 97.6°F | Ht 64.0 in | Wt 185.0 lb

## 2023-07-09 DIAGNOSIS — M79661 Pain in right lower leg: Secondary | ICD-10-CM | POA: Diagnosis not present

## 2023-07-09 DIAGNOSIS — Z7901 Long term (current) use of anticoagulants: Secondary | ICD-10-CM | POA: Diagnosis not present

## 2023-07-09 DIAGNOSIS — C3432 Malignant neoplasm of lower lobe, left bronchus or lung: Secondary | ICD-10-CM

## 2023-07-09 DIAGNOSIS — R109 Unspecified abdominal pain: Secondary | ICD-10-CM

## 2023-07-09 DIAGNOSIS — R0989 Other specified symptoms and signs involving the circulatory and respiratory systems: Secondary | ICD-10-CM

## 2023-07-09 DIAGNOSIS — G8929 Other chronic pain: Secondary | ICD-10-CM | POA: Diagnosis not present

## 2023-07-09 DIAGNOSIS — I10 Essential (primary) hypertension: Secondary | ICD-10-CM

## 2023-07-09 DIAGNOSIS — I480 Paroxysmal atrial fibrillation: Secondary | ICD-10-CM | POA: Diagnosis not present

## 2023-07-09 DIAGNOSIS — E1122 Type 2 diabetes mellitus with diabetic chronic kidney disease: Secondary | ICD-10-CM

## 2023-07-09 DIAGNOSIS — D6851 Activated protein C resistance: Secondary | ICD-10-CM

## 2023-07-09 DIAGNOSIS — M7989 Other specified soft tissue disorders: Secondary | ICD-10-CM | POA: Diagnosis not present

## 2023-07-09 DIAGNOSIS — E559 Vitamin D deficiency, unspecified: Secondary | ICD-10-CM

## 2023-07-09 DIAGNOSIS — M810 Age-related osteoporosis without current pathological fracture: Secondary | ICD-10-CM

## 2023-07-09 DIAGNOSIS — Z87891 Personal history of nicotine dependence: Secondary | ICD-10-CM

## 2023-07-09 LAB — CBC WITH DIFFERENTIAL/PLATELET
Basophils Absolute: 0 10*3/uL (ref 0.0–0.1)
Basophils Relative: 0.6 % (ref 0.0–3.0)
Eosinophils Absolute: 0.2 10*3/uL (ref 0.0–0.7)
Eosinophils Relative: 2.8 % (ref 0.0–5.0)
HCT: 42.3 % (ref 36.0–46.0)
Hemoglobin: 13.9 g/dL (ref 12.0–15.0)
Lymphocytes Relative: 16.5 % (ref 12.0–46.0)
Lymphs Abs: 1.4 10*3/uL (ref 0.7–4.0)
MCHC: 32.9 g/dL (ref 30.0–36.0)
MCV: 93.4 fl (ref 78.0–100.0)
Monocytes Absolute: 0.7 10*3/uL (ref 0.1–1.0)
Monocytes Relative: 9 % (ref 3.0–12.0)
Neutro Abs: 5.8 10*3/uL (ref 1.4–7.7)
Neutrophils Relative %: 71.1 % (ref 43.0–77.0)
Platelets: 193 10*3/uL (ref 150.0–400.0)
RBC: 4.53 Mil/uL (ref 3.87–5.11)
RDW: 14 % (ref 11.5–15.5)
WBC: 8.2 10*3/uL (ref 4.0–10.5)

## 2023-07-09 LAB — TSH: TSH: 0.84 u[IU]/mL (ref 0.35–5.50)

## 2023-07-09 LAB — COMPREHENSIVE METABOLIC PANEL WITH GFR
ALT: 35 U/L (ref 0–35)
AST: 25 U/L (ref 0–37)
Albumin: 4.1 g/dL (ref 3.5–5.2)
Alkaline Phosphatase: 74 U/L (ref 39–117)
BUN: 16 mg/dL (ref 6–23)
CO2: 27 meq/L (ref 19–32)
Calcium: 9.2 mg/dL (ref 8.4–10.5)
Chloride: 106 meq/L (ref 96–112)
Creatinine, Ser: 1.03 mg/dL (ref 0.40–1.20)
GFR: 53.9 mL/min — ABNORMAL LOW (ref >60.00)
Glucose, Bld: 153 mg/dL — ABNORMAL HIGH (ref 70–99)
Potassium: 4.1 meq/L (ref 3.5–5.1)
Sodium: 140 meq/L (ref 135–145)
Total Bilirubin: 0.7 mg/dL (ref 0.2–1.2)
Total Protein: 6.7 g/dL (ref 6.0–8.3)

## 2023-07-09 LAB — MICROALBUMIN / CREATININE URINE RATIO
Creatinine,U: 207 mg/dL
Microalb Creat Ratio: 6.3 mg/g (ref 0.0–30.0)
Microalb, Ur: 1.3 mg/dL (ref 0.0–1.9)

## 2023-07-09 LAB — LIPASE: Lipase: 34 U/L (ref 11.0–59.0)

## 2023-07-09 LAB — VITAMIN D 25 HYDROXY (VIT D DEFICIENCY, FRACTURES): VITD: 32.5 ng/mL (ref 30.00–100.00)

## 2023-07-09 LAB — MAGNESIUM: Magnesium: 1.7 mg/dL (ref 1.5–2.5)

## 2023-07-09 LAB — VITAMIN B12: Vitamin B-12: 394 pg/mL (ref 211–911)

## 2023-07-09 LAB — HEMOGLOBIN A1C: Hgb A1c MFr Bld: 8.4 % — ABNORMAL HIGH (ref 4.6–6.5)

## 2023-07-09 NOTE — Progress Notes (Signed)
 Subjective:     Patient ID: Kimberly Walters, female    DOB: [birth year], 73 y.o.   MRN: [id number]  Chief Complaint  Patient presents with   Medical Management of Chronic Issues    3 month f/u  Wants right leg looked at, saw steph 6/10 and dx w cellulitis. Finished abx but getting worse and moving down to foot    HPI  History of Present Illness         She is here to follow-up on chronic health conditions.  She has several new concerns as well.   C/o right lower leg pain and redness x 2 weeks. Took course of Doxycycline .  Pain with ambulation and at rest.     76-month follow-up for diet-controlled diabetes.  Last A1c 7.3%.  She sees multiple specialists including cardiology, GI, pulmonology, oncology and hematology.  Pulmonology- Dr. Waymond Hailey   Completed radiation therapy for lung cancer the end of March.  She was not a surgical candidate.  Dr. Johna Myers is her radiation oncologist   Appt scheduled with [facility] oncology next week   She is on Xarelto  for factor V Leiden and history of PE and DVT.  Aortic atherosclerosis-on atorvastatin   Symbicort  and spiriva  in the AM and Symbicort  at night. States she has not needed her albuterol .  Previously she was using oxygen  only when needed such as when she ran errands.  Now oxygen     States she is not taking pantoprazole  since it did not seem to make a difference in bloating or abdominal pain.  She saw GI. States she was told her gallbladder was fine.   Osteoporosis- declined DEXA scan and treatment for osteoporosis. Previously took alendronate . Hx of fracture.       Health Maintenance Due  Topic Date Due   FOOT EXAM  Never done   Diabetic kidney evaluation - Urine ACR  Never done   Zoster Vaccines- Shingrix (1 of 2) Never done   OPHTHALMOLOGY EXAM  04/22/2023   MAMMOGRAM  05/23/2023   COVID-19 Vaccine (6 - Pfizer risk 2024-25 season) 05/27/2023    Past Medical History:  Diagnosis Date   Anticoagulant-induced bleeding  (HCC)    Aortic atherosclerosis (HCC)    Bereavement 11/16/2018   Cardiac arrest (HCC) 1989   pt states her heart stopped and they had to shock her heart and restarted it (w/ Dr. Ebbie Goldmann)   Clotting disorder (HCC)    Diabetes mellitus without complication (HCC)    Emphysema, unspecified (HCC)    Factor V Leiden mutation (HCC)    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    GERD (gastroesophageal reflux disease)    Gross hematuria    History of hiatal hernia    Hyperlipidemia    Hypertension    Mitral valve prolapse    Nodule of lower lobe of left lung    Paroxysmal atrial fibrillation with RVR (HCC)    Pleural effusion on left    Post-thrombotic syndrome of left lower extremity    Primary hypercoagulable state (HCC)    Pulmonary emboli (HCC)    Pyelonephritis 2021   Upper respiratory infection, viral 04/02/2021    Past Surgical History:  Procedure Laterality Date   BIOPSY  07/19/2020   Procedure: BIOPSY;  Surgeon: Sergio Dandy, MD;  Location: [facility] END[facility]COPY;  Service: Endoscopy;;   CARDIAC CATHETERIZATION     pt states she had one in 1989 and 1996 w/ Dr. Ebbie Goldmann   COLONOSCOPY     ESOPHAGOGASTRODUODENOSCOPY (EGD) WITH PROPOFOL   N/A 07/19/2020   Procedure: ESOPHAGOGASTRODUODENOSCOPY (EGD) WITH PROPOFOL ;  Surgeon: Sergio Dandy, MD;  Location: [facility] END[facility]COPY;  Service: Endoscopy;  Laterality: N/A;   FUDUCIAL PLACEMENT N/A 03/06/2023   Procedure: PLACEMENT OF FUDUCIAL;  Surgeon: Zelphia Higashi, MD;  Location: [facility] OR;  Service: Thoracic;  Laterality: N/A;   HYSTERECTOMY ABDOMINAL WITH SALPINGO-OOPHORECTOMY  1975   LYMPH NODE BIOPSY Right 1983   Neck   VIDEO BRONCHOSCOPY WITH ENDOBRONCHIAL NAVIGATION N/A 03/06/2023   Procedure: VIDEO BRONCHOSCOPY WITH ENDOBRONCHIAL NAVIGATION;  Surgeon: Zelphia Higashi, MD;  Location: [facility] OR;  Service: Thoracic;  Laterality: N/A;    Family History  Problem Relation Age of Onset   Diabetes Mother    Heart disease Mother    Cancer Mother         Uterine   Diabetes Father    CAD Father    Heart attack Father    Hypothyroidism Sister    Melanoma Sister    Clotting disorder Sister    Stomach cancer Sister    Diabetes Sister    Diabetes Sister    Hypertension Sister    Hypothyroidism Sister    Diabetes Sister    Hypertension Sister    Hypothyroidism Sister    Cancer Sister    Cancer Sister    Diabetes Daughter    Diabetes Son    Diabetes Son    Breast cancer Half-Sister    Pancreatic cancer Nephew    Uterine cancer Niece    Ovarian cancer Other    Uterine cancer Other    Breast cancer Other    Colon cancer Neg Hx    Esophageal cancer Neg Hx     Social History   Socioeconomic History   Marital status: Widowed    Spouse name: Not on file   Number of children: 3   Years of education: Not on file   Highest education level: GED or equivalent  Occupational History   Occupation: Retired    Associate Professor: [employer]  Tobacco Use   Smoking status: Former    Current packs/day: 0.00    Average packs/day: 0.5 packs/day for 55.0 years (27.5 ttl pk-yrs)    Types: Cigarettes    Quit date: 07/01/2019    Years since quitting: 4.0   Smokeless tobacco: Never  Vaping Use   Vaping status: Never Used  Substance and Sexual Activity   Alcohol use: No   Drug use: No   Sexual activity: Yes    Birth control/protection: Surgical, Post-menopausal  Other Topics Concern   Not on file  Social History Narrative   Current Social History 09/12/2020      Patient lives with 2 sons and granddaughter in a one story home. There are 3 steps with handrail up to the entrance the patient uses.       Patient's method of transportation is personal car.      The highest level of education was GED      The patient currently retired from [employer].      Identified important Relationships are my children, my grandchildren, my great-grandchildren, and my sisters      Pets : dog and Brewing technologist / Fun: read, watch movies, go walking, and  just do things with the children.      Current Stressors: dog      Religious / Personal Beliefs: Old fashioned [employer].       Pt lives with her 2 sons and granddaughter-2025  Social Drivers of Health   Financial Resource Strain: Medium Risk (01/29/2023)   Overall Financial Resource Strain (CARDIA)    Difficulty of Paying Living Expenses: Somewhat hard  Food Insecurity: No Food Insecurity (01/29/2023)   Hunger Vital Sign    Worried About Running Out of Food in the Last Year: Never true    Ran Out of Food in the Last Year: Never true  Transportation Needs: No Transportation Needs (01/29/2023)   PRAPARE - Administrator, Civil Service (Medical): No    Lack of Transportation (Non-Medical): No  Physical Activity: Inactive (01/29/2023)   Exercise Vital Sign    Days of Exercise per Week: 0 days    Minutes of Exercise per Session: 0 min  Stress: No Stress Concern Present (01/29/2023)   [employer] of Occupational Health - Occupational Stress Questionnaire    Feeling of Stress : Not at all  Social Connections: Moderately Isolated (01/29/2023)   Social Connection and Isolation Panel    Frequency of Communication with Friends and Family: More than three times a week    Frequency of Social Gatherings with Friends and Family: Three times a week    Attends Religious Services: 1 to 4 times per year    Active Member of Clubs or Organizations: No    Attends Banker Meetings: Never    Marital Status: Widowed  Intimate Partner Violence: Patient Unable To Answer (01/29/2023)   Humiliation, Afraid, Rape, and Kick questionnaire    Fear of Current or Ex-Partner: Patient unable to answer    Emotionally Abused: Patient unable to answer    Physically Abused: Patient unable to answer    Sexually Abused: Patient unable to answer    Outpatient Medications Prior to Visit  Medication Sig Dispense Refill   acetaminophen  (TYLENOL ) 500 MG tablet Take 500 mg by  mouth every 6 (six) hours as needed for moderate pain.     albuterol  (VENTOLIN  HFA) 108 (90 Base) MCG/ACT inhaler INHALE 2 PUFFS BY MOUTH EVERY 6 HOURS AS NEEDED FOR WHEEZING OR SHORTNESS OF BREATH 18 g 0   atorvastatin  (LIPITOR) 20 MG tablet Take 1 tablet (20 mg total) by mouth daily. 90 tablet 3   diltiazem  (DILACOR XR ) 120 MG 24 hr capsule Take 1 capsule (120 mg total) by mouth daily. 90 capsule 3   lisinopril  (ZESTRIL ) 40 MG tablet Take 1 tablet by mouth once daily 90 tablet 0   rivaroxaban  (XARELTO ) 20 MG TABS tablet TAKE 1 TABLET BY MOUTH ONCE DAILY WITH SUPPER 90 tablet 1   SPIRIVA  RESPIMAT 2.5 MCG/ACT AERS INHALE 2 SPRAY(S) BY MOUTH ONCE DAILY AT  8PM 4 g 0   SYMBICORT  80-4.5 MCG/ACT inhaler Inhale 2 puffs by mouth twice daily 11 g 1   Vitamin D , Cholecalciferol, 25 MCG (1000 UT) TABS Take 1 tablet by mouth once daily 90 tablet 0   pantoprazole  (PROTONIX ) 40 MG tablet Take 1 tablet (40 mg total) by mouth daily. 30 tablet 1   No facility-administered medications prior to visit.    No Known Allergies  Review of Systems  Constitutional:  Negative for chills, fever and weight loss.  HENT:  Negative for congestion and sore throat.   Eyes:  Negative for blurred vision and double vision.  Respiratory:  Positive for shortness of breath. Negative for cough, sputum production and wheezing.   Cardiovascular:  Positive for leg swelling. Negative for chest pain and palpitations.  Gastrointestinal:  Positive for abdominal pain. Negative for constipation, diarrhea, nausea  and vomiting.  Genitourinary:  Negative for dysuria, frequency and urgency.  Musculoskeletal:  Positive for joint pain and myalgias. Negative for falls.       Right ankle and lower leg  Neurological:  Negative for dizziness, tingling, focal weakness and headaches.  Psychiatric/Behavioral:  Negative for depression. The patient is not nervous/anxious.        Objective:    Physical Exam Constitutional:      General: She is  not in acute distress.    Appearance: She is not ill-appearing.  HENT:     Mouth/Throat:     Mouth: Mucous membranes are moist.     Pharynx: Oropharynx is clear.   Eyes:     Extraocular Movements: Extraocular movements intact.     Conjunctiva/sclera: Conjunctivae normal.    Cardiovascular:     Rate and Rhythm: Normal rate. Rhythm irregular.     Comments: Decreased pedal pulses in RLE compared to LLE Pulmonary:     Effort: Pulmonary effort is normal.     Breath sounds: Normal breath sounds.  Abdominal:     General: There is no distension.     Palpations: Abdomen is soft.   Musculoskeletal:     Cervical back: Normal range of motion and neck supple. No tenderness.     Right lower leg: Edema present.     Left lower leg: Edema present.     Comments: Trace edema bilaterally   Lymphadenopathy:     Cervical: No cervical adenopathy.   Skin:    General: Skin is warm and dry.     Comments: Scar to anterior RLE. Mild erythema to right anterior lower leg without increased warmth    Neurological:     General: No focal deficit present.     Mental Status: She is alert and oriented to person, place, and time.     Motor: No weakness.     Coordination: Coordination normal.     Gait: Gait normal.   Psychiatric:        Mood and Affect: Mood normal.        Behavior: Behavior normal.        Thought Content: Thought content normal.      BP 126/80 (BP Location: Left Arm, Patient Position: Sitting)   Pulse 76   Temp 97.6 F (36.4 C) (Temporal)   Ht 5' 4 (1.626 m)   Wt 185 lb (83.9 kg)   LMP 06/21/1973   SpO2 96% Comment: portable oxygen  3L  BMI 31.76 kg/m  Wt Readings from Last 3 Encounters:  07/09/23 185 lb (83.9 kg)  07/01/23 185 lb (83.9 kg)  04/28/23 182 lb (82.6 kg)       Assessment & Plan:   Problem List Items Addressed This Visit     Decreased pedal pulses   Stat arterial ABIs ordered.       Relevant Orders   US  ARTERIAL ABI (SCREENING LOWER EXTREMITY)    Factor V Leiden [facility])   Sees hematology. Continue Xarelto        Former smoker   Stopped smoking in December.  Reports feels better overall since stopping      Hypertension   Reviewed cardiology notes. BP controlled.  Continue current medication and low-sodium diet.  Monitor blood pressure at home.      Relevant Orders   CBC with Differential/Platelet   Comprehensive metabolic panel with GFR   TSH   Long term (current) use of anticoagulants   Malignant neoplasm of lower lobe of left lung (  HCC)   Osteoporosis   Previously declined DEXA and treatment.  We will need to address this at her next visit.  Consider referral to osteoporosis clinic.      Pain and swelling of right lower leg   Stat ABIs ordered due to decreased pedal pulses in right foot.      Relevant Orders   Magnesium    US  ARTERIAL ABI (SCREENING LOWER EXTREMITY)   Paroxysmal atrial fibrillation with RVR (HCC)   On Xarelto .  Asymptomatic.      Relevant Orders   CBC with Differential/Platelet   Comprehensive metabolic panel with GFR   Type 2 diabetes mellitus with chronic kidney disease, without long-term current use of insulin (HCC) - Primary   Working on diet. Prefers to hold off on medication. Last A1c 7.3%  Check A1c and follow-up.  Urine microalbumin ordered.  Foot exam done.  She will call and schedule an eye exam.      Relevant Orders   CBC with Differential/Platelet   Comprehensive metabolic panel with GFR   TSH   Vitamin B12   Hemoglobin A1c   Microalbumin / creatinine urine ratio   Vitamin D  deficiency   Check vitamin D  level and follow-up      Relevant Orders   VITAMIN D  25 Hydroxy (Vit-D Deficiency, Fractures)   Other Visit Diagnoses       Chronic intermittent abdominal pain       Relevant Orders   CBC with Differential/Platelet   Comprehensive metabolic panel with GFR   Lipase       I have discontinued Kimberly Walters's pantoprazole . I am also having her maintain her acetaminophen ,  atorvastatin , diltiazem , Xarelto , Vitamin D  (Cholecalciferol), Symbicort , albuterol , Spiriva  Respimat, and lisinopril .  No orders of the defined types were placed in this encounter.

## 2023-07-09 NOTE — Assessment & Plan Note (Signed)
 Previously declined DEXA and treatment.  We will need to address this at her next visit.  Consider referral to osteoporosis clinic.

## 2023-07-09 NOTE — Assessment & Plan Note (Signed)
 Working on diet. Prefers to hold off on medication. Last A1c 7.3%  Check A1c and follow-up.  Urine microalbumin ordered.  Foot exam done.  She will call and schedule an eye exam.

## 2023-07-09 NOTE — Assessment & Plan Note (Signed)
 Reviewed cardiology notes. BP controlled.  Continue current medication and low-sodium diet.  Monitor blood pressure at home.

## 2023-07-09 NOTE — Assessment & Plan Note (Signed)
 Stat arterial ABIs ordered.

## 2023-07-09 NOTE — Addendum Note (Signed)
 Addended by: Aswad Wandrey L on: 07/09/2023 10:07 AM   Modules accepted: Orders

## 2023-07-09 NOTE — Assessment & Plan Note (Signed)
 Stat ABIs ordered due to decreased pedal pulses in right foot.

## 2023-07-09 NOTE — Assessment & Plan Note (Signed)
 On Xarelto .  Asymptomatic.

## 2023-07-09 NOTE — Assessment & Plan Note (Signed)
 Stopped smoking in December.  Reports feels better overall since stopping

## 2023-07-09 NOTE — Assessment & Plan Note (Signed)
Check vitamin D level and follow up 

## 2023-07-09 NOTE — Assessment & Plan Note (Signed)
 Sees hematology. Continue Xarelto

## 2023-07-09 NOTE — Patient Instructions (Signed)
 Please go downstairs for labs before you leave today.  I am ordering an ultrasound of your leg and they will call you to schedule this.  Please call and schedule with your gastroenterologist to follow-up on chronic abdominal pain.  If your pain gets severe, you will need to go to the emergency department.  I will be in touch with your results and with recommendations.

## 2023-07-10 ENCOUNTER — Other Ambulatory Visit: Payer: Self-pay

## 2023-07-10 ENCOUNTER — Ambulatory Visit: Payer: Self-pay | Admitting: Family Medicine

## 2023-07-10 MED ORDER — BLOOD GLUCOSE TEST VI STRP: 1.0000 | ORAL_STRIP | Freq: Two times a day (BID) | 0 refills | Status: DC

## 2023-07-10 MED ORDER — FARXIGA 10 MG PO TABS: 10.0000 mg | ORAL_TABLET | Freq: Every day | ORAL | 2 refills | Status: DC

## 2023-07-10 MED ORDER — LANCETS MISC. MISC: 1.0000 | Freq: Three times a day (TID) | 0 refills | Status: AC

## 2023-07-10 MED ORDER — LANCET DEVICE MISC: 1.0000 | Freq: Three times a day (TID) | 0 refills | Status: AC

## 2023-07-10 MED ORDER — BLOOD GLUCOSE MONITORING SUPPL DEVI: 1.0000 | Freq: Three times a day (TID) | 0 refills | Status: AC

## 2023-07-10 NOTE — Progress Notes (Signed)
 Please let her know that her diabetes is no longer controlled. Her A1c is 8.4% and this increases her risk for heart disease, worsening kidney disease, heart attack and stroke. Is she ok starting on medication such as Metformin or Farxiga for her diabetes? These medications are both pills. If she is willing to start checking her blood sugars, please send in meter and testing supplies.  If she would like to come in to talk about all of this, feel free to schedule her in the next week or 2.  If she is okay starting treatment then please ask her to follow up with me in 4-6 weeks.

## 2023-07-11 ENCOUNTER — Ambulatory Visit (HOSPITAL_COMMUNITY)
Admission: RE | Admit: 2023-07-11 | Discharge: 2023-07-11 | Disposition: A | Discharge status: Home / Self Care | Source: Ambulatory Visit | Attending: Family Medicine | Admitting: Family Medicine

## 2023-07-11 ENCOUNTER — Other Ambulatory Visit: Payer: Self-pay | Admitting: Family Medicine

## 2023-07-11 DIAGNOSIS — M7989 Other specified soft tissue disorders: Secondary | ICD-10-CM

## 2023-07-11 DIAGNOSIS — R0989 Other specified symptoms and signs involving the circulatory and respiratory systems: Secondary | ICD-10-CM | POA: Diagnosis not present

## 2023-07-11 DIAGNOSIS — M79661 Pain in right lower leg: Secondary | ICD-10-CM

## 2023-07-11 LAB — VAS US ABI WITH/WO TBI
Left ABI: 1.12
Right ABI: 1.04

## 2023-07-11 MED ORDER — GABAPENTIN 100 MG PO CAPS: 100.0000 mg | ORAL_CAPSULE | Freq: Two times a day (BID) | ORAL | 0 refills | Status: DC

## 2023-07-11 NOTE — Progress Notes (Signed)
 Please let her know that the ultrasound results of her legs are in normal range.  Please have her limit sodium and elevate her legs when she is sitting to help with leg pain and swelling. I would like to recheck her legs and discuss diabetes and osteoporosis at her follow up in July.

## 2023-07-14 ENCOUNTER — Inpatient Hospital Stay: Payer: Medicare HMO | Attending: Internal Medicine

## 2023-07-14 ENCOUNTER — Inpatient Hospital Stay (HOSPITAL_BASED_OUTPATIENT_CLINIC_OR_DEPARTMENT_OTHER): Payer: Medicare HMO | Admitting: Physician Assistant

## 2023-07-14 VITALS — BP 134/64 | HR 77 | Temp 97.2°F | Resp 13 | Wt 186.2 lb

## 2023-07-14 DIAGNOSIS — C3432 Malignant neoplasm of lower lobe, left bronchus or lung: Secondary | ICD-10-CM | POA: Diagnosis not present

## 2023-07-14 DIAGNOSIS — Z85118 Personal history of other malignant neoplasm of bronchus and lung: Secondary | ICD-10-CM | POA: Diagnosis not present

## 2023-07-14 DIAGNOSIS — C3492 Malignant neoplasm of unspecified part of left bronchus or lung: Secondary | ICD-10-CM

## 2023-07-14 DIAGNOSIS — Z08 Encounter for follow-up examination after completed treatment for malignant neoplasm: Secondary | ICD-10-CM | POA: Insufficient documentation

## 2023-07-14 DIAGNOSIS — R911 Solitary pulmonary nodule: Secondary | ICD-10-CM

## 2023-07-14 LAB — CBC WITH DIFFERENTIAL (CANCER CENTER ONLY)
Abs Immature Granulocytes: 0.03 10*3/uL (ref 0.00–0.07)
Basophils Absolute: 0.1 10*3/uL (ref 0.0–0.1)
Basophils Relative: 1 %
Eosinophils Absolute: 0.2 10*3/uL (ref 0.0–0.5)
Eosinophils Relative: 2 %
HCT: 40.5 % (ref 36.0–46.0)
Hemoglobin: 13.6 g/dL (ref 12.0–15.0)
Immature Granulocytes: 0 %
Lymphocytes Relative: 17 %
Lymphs Abs: 1.3 10*3/uL (ref 0.7–4.0)
MCH: 31.3 pg (ref 26.0–34.0)
MCHC: 33.6 g/dL (ref 30.0–36.0)
MCV: 93.3 fL (ref 80.0–100.0)
Monocytes Absolute: 0.7 10*3/uL (ref 0.1–1.0)
Monocytes Relative: 9 %
Neutro Abs: 5.7 10*3/uL (ref 1.7–7.7)
Neutrophils Relative %: 71 %
Platelet Count: 186 10*3/uL (ref 150–400)
RBC: 4.34 MIL/uL (ref 3.87–5.11)
RDW: 13.1 % (ref 11.5–15.5)
WBC Count: 8 10*3/uL (ref 4.0–10.5)
nRBC: 0 % (ref 0.0–0.2)

## 2023-07-14 LAB — CMP (CANCER CENTER ONLY)
ALT: 33 U/L (ref 0–44)
AST: 24 U/L (ref 15–41)
Albumin: 4.1 g/dL (ref 3.5–5.0)
Alkaline Phosphatase: 71 U/L (ref 38–126)
Anion gap: 7 (ref 5–15)
BUN: 23 mg/dL (ref 8–23)
CO2: 27 mmol/L (ref 22–32)
Calcium: 9.7 mg/dL (ref 8.9–10.3)
Chloride: 107 mmol/L (ref 98–111)
Creatinine: 1.12 mg/dL — ABNORMAL HIGH (ref 0.44–1.00)
GFR, Estimated: 52 mL/min — ABNORMAL LOW (ref >60)
Glucose, Bld: 149 mg/dL — ABNORMAL HIGH (ref 70–99)
Potassium: 4.9 mmol/L (ref 3.5–5.1)
Sodium: 141 mmol/L (ref 135–145)
Total Bilirubin: 0.8 mg/dL (ref 0.0–1.2)
Total Protein: 6.7 g/dL (ref 6.5–8.1)

## 2023-07-24 ENCOUNTER — Ambulatory Visit: Payer: Medicare HMO | Admitting: Oncology

## 2023-07-24 ENCOUNTER — Other Ambulatory Visit: Payer: Self-pay | Admitting: Family Medicine

## 2023-07-24 DIAGNOSIS — J439 Emphysema, unspecified: Secondary | ICD-10-CM

## 2023-08-09 ENCOUNTER — Other Ambulatory Visit: Payer: Self-pay | Admitting: Family Medicine

## 2023-08-09 DIAGNOSIS — E559 Vitamin D deficiency, unspecified: Secondary | ICD-10-CM

## 2023-08-18 ENCOUNTER — Encounter: Payer: Self-pay | Admitting: Gastroenterology

## 2023-08-21 ENCOUNTER — Ambulatory Visit: Payer: Self-pay | Admitting: Family Medicine

## 2023-08-21 ENCOUNTER — Ambulatory Visit (INDEPENDENT_AMBULATORY_CARE_PROVIDER_SITE_OTHER)

## 2023-08-21 ENCOUNTER — Encounter: Payer: Self-pay | Admitting: Family Medicine

## 2023-08-21 ENCOUNTER — Ambulatory Visit (INDEPENDENT_AMBULATORY_CARE_PROVIDER_SITE_OTHER): Admitting: Family Medicine

## 2023-08-21 VITALS — BP 130/74 | HR 65 | Temp 97.6°F | Ht 64.0 in | Wt 183.0 lb

## 2023-08-21 DIAGNOSIS — Z7901 Long term (current) use of anticoagulants: Secondary | ICD-10-CM

## 2023-08-21 DIAGNOSIS — M79661 Pain in right lower leg: Secondary | ICD-10-CM | POA: Diagnosis not present

## 2023-08-21 DIAGNOSIS — R600 Localized edema: Secondary | ICD-10-CM | POA: Diagnosis not present

## 2023-08-21 DIAGNOSIS — I700 Atherosclerosis of aorta: Secondary | ICD-10-CM

## 2023-08-21 DIAGNOSIS — D6851 Activated protein C resistance: Secondary | ICD-10-CM

## 2023-08-21 DIAGNOSIS — D225 Melanocytic nevi of trunk: Secondary | ICD-10-CM | POA: Diagnosis not present

## 2023-08-21 DIAGNOSIS — M7989 Other specified soft tissue disorders: Secondary | ICD-10-CM

## 2023-08-21 DIAGNOSIS — M1711 Unilateral primary osteoarthritis, right knee: Secondary | ICD-10-CM | POA: Diagnosis not present

## 2023-08-21 DIAGNOSIS — I872 Venous insufficiency (chronic) (peripheral): Secondary | ICD-10-CM

## 2023-08-21 DIAGNOSIS — M79604 Pain in right leg: Secondary | ICD-10-CM | POA: Diagnosis not present

## 2023-08-21 LAB — CBC WITH DIFFERENTIAL/PLATELET
Basophils Absolute: 0 K/uL (ref 0.0–0.1)
Basophils Relative: 0.6 % (ref 0.0–3.0)
Eosinophils Absolute: 0.1 K/uL (ref 0.0–0.7)
Eosinophils Relative: 2.8 % (ref 0.0–5.0)
HCT: 43.1 % (ref 36.0–46.0)
Hemoglobin: 14.1 g/dL (ref 12.0–15.0)
Lymphocytes Relative: 22.8 % (ref 12.0–46.0)
Lymphs Abs: 1.1 K/uL (ref 0.7–4.0)
MCHC: 32.6 g/dL (ref 30.0–36.0)
MCV: 94 fl (ref 78.0–100.0)
Monocytes Absolute: 0.6 K/uL (ref 0.1–1.0)
Monocytes Relative: 12.7 % — ABNORMAL HIGH (ref 3.0–12.0)
Neutro Abs: 2.9 K/uL (ref 1.4–7.7)
Neutrophils Relative %: 61.1 % (ref 43.0–77.0)
Platelets: 179 K/uL (ref 150.0–400.0)
RBC: 4.59 Mil/uL (ref 3.87–5.11)
RDW: 14.3 % (ref 11.5–15.5)
WBC: 4.8 K/uL (ref 4.0–10.5)

## 2023-08-21 LAB — SEDIMENTATION RATE: Sed Rate: 9 mm/h (ref 0–30)

## 2023-08-21 LAB — C-REACTIVE PROTEIN: CRP: <1 mg/dL (ref 0.5–20.0)

## 2023-08-21 MED ORDER — BETAMETHASONE VALERATE 0.1 % EX OINT: 1.0000 | TOPICAL_OINTMENT | Freq: Two times a day (BID) | CUTANEOUS | 0 refills | Status: DC

## 2023-08-21 MED ORDER — VASCULERA PO TABS: 630.0000 mg | ORAL_TABLET | Freq: Every day | ORAL | 2 refills | Status: DC

## 2023-08-21 NOTE — Progress Notes (Addendum)
 Subjective:     Patient ID: Kimberly Walters, female    DOB: April 07, 1950, 73 y.o.   MRN: [id number]  Chief Complaint  Patient presents with   Diabetes    Picked up farxiga  and checking BS has record  Right lower leg swelling for 3 months, redness and pain. Hot to the touch. Did go do the vascular center and told her blood flow was acceptable     HPI  Discussed the use of AI scribe software for clinical note transcription with the patient, who gave verbal consent to proceed.  History of Present Illness Kimberly Walters is a 73 year old female with uncontrolled diabetes who presents for follow-up.  Hyperglycemia and diabetes management - Uncontrolled diabetes mellitus with recent A1c increase from 7.1% to 8.4% over five months - Initiated on Farxiga  10 mg daily; no prior diabetes medications before this - Home blood glucose initially 259 mg/dL, improved to 859-841 mg/dL after dietary modifications - Fasting blood glucose averages approximately 132 mg/dL  Right lower extremity pain and swelling - Persistent pain and swelling in the right lower leg since June 2025 - Pain is severe, occasionally associated with warmth - Symptoms disrupt sleep - Relief achieved with lotion application and swimming - No recent trauma or injury to the leg - Previously treated as cellulitis without improvement -ABIs done and normal    Cutaneous lesion - Mole on the back with recent growth, observed by her daughter  Oncologic history and anticoagulation - Followed by oncology for non-small cell lung cancer - Takes Xarelto  for pulmonary embolism and paroxysmal atrial fibrillation - Factor V Leiden mutation present  Cardiovascular risk management - Takes atorvastatin  as prescribed by her cardiologist     Health Maintenance Due  Topic Date Due   FOOT EXAM  Never done   Zoster Vaccines- Shingrix (1 of 2) Never done   OPHTHALMOLOGY EXAM  04/22/2023   MAMMOGRAM  05/23/2023   COVID-19 Vaccine (6 -  Pfizer risk 2024-25 season) 05/27/2023    Past Medical History:  Diagnosis Date   Anticoagulant-induced bleeding (HCC)    Aortic atherosclerosis (HCC)    Bereavement 11/16/2018   Cardiac arrest (HCC) 1989   pt states her heart stopped and they had to shock her heart and restarted it (w/ Dr. Lavon)   Clotting disorder (HCC)    Diabetes mellitus without complication (HCC)    Emphysema, unspecified (HCC)    Factor V Leiden mutation (HCC)    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    GERD (gastroesophageal reflux disease)    Gross hematuria    History of hiatal hernia    Hyperlipidemia    Hypertension    Mitral valve prolapse    Nodule of lower lobe of left lung    Paroxysmal atrial fibrillation with RVR (HCC)    Pleural effusion on left    Post-thrombotic syndrome of left lower extremity    Primary hypercoagulable state (HCC)    Pulmonary emboli (HCC)    Pyelonephritis 2021   Upper respiratory infection, viral 04/02/2021    Past Surgical History:  Procedure Laterality Date   BIOPSY  07/19/2020   Procedure: BIOPSY;  Surgeon: Shila Gustav GAILS, MD;  Location: [facility];  Service: Endoscopy;;   CARDIAC CATHETERIZATION     pt states she had one in 1989 and 1996 w/ Dr. Lavon   COLONOSCOPY     ESOPHAGOGASTRODUODENOSCOPY (EGD) WITH PROPOFOL  N/A 07/19/2020   Procedure: ESOPHAGOGASTRODUODENOSCOPY (EGD) WITH PROPOFOL ;  Surgeon: Nandigam, Kavitha V, MD;  Location: [facility];  Service: Endoscopy;  Laterality: N/A;   FUDUCIAL PLACEMENT N/A 03/06/2023   Procedure: PLACEMENT OF FUDUCIAL;  Surgeon: Kerrin Elspeth BROCKS, MD;  Location: [facility];  Service: Thoracic;  Laterality: N/A;   HYSTERECTOMY ABDOMINAL WITH SALPINGO-OOPHORECTOMY  1975   LYMPH NODE BIOPSY Right 1983   Neck   VIDEO BRONCHOSCOPY WITH ENDOBRONCHIAL NAVIGATION N/A 03/06/2023   Procedure: VIDEO BRONCHOSCOPY WITH ENDOBRONCHIAL NAVIGATION;  Surgeon: Kerrin Elspeth BROCKS, MD;  Location: [facility] OR;  Service: Thoracic;  Laterality:  N/A;    Family History  Problem Relation Age of Onset   Diabetes Mother    Heart disease Mother    Cancer Mother        Uterine   Diabetes Father    CAD Father    Heart attack Father    Hypothyroidism Sister    Melanoma Sister    Clotting disorder Sister    Stomach cancer Sister    Diabetes Sister    Diabetes Sister    Hypertension Sister    Hypothyroidism Sister    Diabetes Sister    Hypertension Sister    Hypothyroidism Sister    Cancer Sister    Cancer Sister    Diabetes Daughter    Diabetes Son    Diabetes Son    Breast cancer Half-Sister    Pancreatic cancer Nephew    Uterine cancer Niece    Ovarian cancer Other    Uterine cancer Other    Breast cancer Other    Colon cancer Neg Hx    Esophageal cancer Neg Hx     Social History   Socioeconomic History   Marital status: Widowed    Spouse name: Not on file   Number of children: 3   Years of education: Not on file   Highest education level: GED or equivalent  Occupational History   Occupation: Retired    Associate Professor: [employer]  Tobacco Use   Smoking status: Former    Current packs/day: 0.00    Average packs/day: 0.5 packs/day for 55.0 years (27.5 ttl pk-yrs)    Types: Cigarettes    Quit date: 07/01/2019    Years since quitting: 4.1   Smokeless tobacco: Never  Vaping Use   Vaping status: Never Used  Substance and Sexual Activity   Alcohol use: No   Drug use: No   Sexual activity: Yes    Birth control/protection: Surgical, Post-menopausal  Other Topics Concern   Not on file  Social History Narrative   Current Social History 09/12/2020      Patient lives with 2 sons and granddaughter in a one story home. There are 3 steps with handrail up to the entrance the patient uses.       Patient's method of transportation is personal car.      The highest level of education was GED      The patient currently retired from [employer].      Identified important Relationships are my children, my grandchildren, my  great-grandchildren, and my sisters      Pets : dog and Brewing technologist / Fun: read, watch movies, go walking, and just do things with the children.      Current Stressors: dog      Religious / Personal Beliefs: Old fashioned [employer].       Pt lives with her 2 sons and granddaughter-2025            Social Drivers of Health  Financial Resource Strain: Medium Risk (01/29/2023)   Overall Financial Resource Strain (CARDIA)    Difficulty of Paying Living Expenses: Somewhat hard  Food Insecurity: No Food Insecurity (01/29/2023)   Hunger Vital Sign    Worried About Running Out of Food in the Last Year: Never true    Ran Out of Food in the Last Year: Never true  Transportation Needs: No Transportation Needs (01/29/2023)   PRAPARE - Administrator, Civil Service (Medical): No    Lack of Transportation (Non-Medical): No  Physical Activity: Inactive (01/29/2023)   Exercise Vital Sign    Days of Exercise per Week: 0 days    Minutes of Exercise per Session: 0 min  Stress: No Stress Concern Present (01/29/2023)   [employer] of Occupational Health - Occupational Stress Questionnaire    Feeling of Stress : Not at all  Social Connections: Moderately Isolated (01/29/2023)   Social Connection and Isolation Panel    Frequency of Communication with Friends and Family: More than three times a week    Frequency of Social Gatherings with Friends and Family: Three times a week    Attends Religious Services: 1 to 4 times per year    Active Member of Clubs or Organizations: No    Attends Banker Meetings: Never    Marital Status: Widowed  Intimate Partner Violence: Patient Unable To Answer (01/29/2023)   Humiliation, Afraid, Rape, and Kick questionnaire    Fear of Current or Ex-Partner: Patient unable to answer    Emotionally Abused: Patient unable to answer    Physically Abused: Patient unable to answer    Sexually Abused: Patient unable to answer     Outpatient Medications Prior to Visit  Medication Sig Dispense Refill   acetaminophen  (TYLENOL ) 500 MG tablet Take 500 mg by mouth every 6 (six) hours as needed for moderate pain.     albuterol  (VENTOLIN  HFA) 108 (90 Base) MCG/ACT inhaler INHALE 2 PUFFS BY MOUTH EVERY 6 HOURS AS NEEDED FOR WHEEZING OR SHORTNESS OF BREATH 18 g 0   atorvastatin  (LIPITOR) 20 MG tablet Take 1 tablet (20 mg total) by mouth daily. 90 tablet 3   Blood Glucose Monitoring Suppl DEVI 1 each by Does not apply route in the morning, at noon, and at bedtime. May substitute to any manufacturer covered by patient's insurance. 1 each 0   diltiazem  (DILACOR XR ) 120 MG 24 hr capsule Take 1 capsule (120 mg total) by mouth daily. 90 capsule 3   FARXIGA  10 MG TABS tablet Take 1 tablet (10 mg total) by mouth daily. 30 tablet 2   gabapentin  (NEURONTIN ) 100 MG capsule Take 1 capsule (100 mg total) by mouth 2 (two) times daily. 60 capsule 0   Glucose Blood (BLOOD GLUCOSE TEST STRIPS) STRP 1 each by In Vitro route 2 (two) times daily. May substitute to any manufacturer covered by patient's insurance. 600 strip 0   lisinopril  (ZESTRIL ) 40 MG tablet Take 1 tablet by mouth once daily 90 tablet 0   rivaroxaban  (XARELTO ) 20 MG TABS tablet TAKE 1 TABLET BY MOUTH ONCE DAILY WITH SUPPER 90 tablet 1   SYMBICORT  80-4.5 MCG/ACT inhaler Inhale 2 puffs by mouth twice daily 11 g 1   Tiotropium Bromide Monohydrate  (SPIRIVA  RESPIMAT) 2.5 MCG/ACT AERS INHALE 2 SPRAY(S) BY MOUTH ONCE DAILY AT  8  PM. 4 g 2   Vitamin D , Cholecalciferol, 25 MCG (1000 UT) TABS Take 1 tablet by mouth once daily 90 tablet 0   No  facility-administered medications prior to visit.    No Known Allergies  Review of Systems  Constitutional:  Negative for chills and fever.  Respiratory:  Negative for shortness of breath.   Cardiovascular:  Negative for chest pain, palpitations and leg swelling.  Gastrointestinal:  Negative for abdominal pain, nausea and vomiting.   Musculoskeletal:  Positive for myalgias. Negative for falls.       Right lower leg pain   Neurological:  Negative for dizziness, focal weakness and headaches.       Objective:    Physical Exam Constitutional:      General: She is not in acute distress.    Appearance: She is not ill-appearing.  HENT:     Mouth/Throat:     Mouth: Mucous membranes are moist.     Pharynx: Oropharynx is clear.  Eyes:     Extraocular Movements: Extraocular movements intact.     Conjunctiva/sclera: Conjunctivae normal.  Cardiovascular:     Rate and Rhythm: Normal rate and regular rhythm.  Pulmonary:     Effort: Pulmonary effort is normal.     Breath sounds: Normal breath sounds.  Musculoskeletal:        General: Tenderness present.     Cervical back: Normal range of motion and neck supple.     Right knee: Normal.     Right lower leg: Tenderness and bony tenderness present. Edema present.       Legs:     Comments: RLE with erythema, marked TTP to light touch, edema  Skin:    General: Skin is warm and dry.     Findings: Rash present.  Neurological:     General: No focal deficit present.     Mental Status: She is alert and oriented to person, place, and time.     Motor: No weakness.     Coordination: Coordination normal.     Gait: Gait normal.  Psychiatric:        Mood and Affect: Mood normal.        Behavior: Behavior normal.        Thought Content: Thought content normal.      BP 130/74 (BP Location: Left Arm, Patient Position: Sitting)   Pulse 65   Temp 97.6 F (36.4 C) (Temporal)   Ht 5' 4 (1.626 m)   Wt 183 lb (83 kg)   LMP 06/21/1973   SpO2 97% Comment: on oxygen   BMI 31.41 kg/m  Wt Readings from Last 3 Encounters:  08/21/23 183 lb (83 kg)  07/14/23 186 lb 3.2 oz (84.5 kg)  07/09/23 185 lb (83.9 kg)       Assessment & Plan:   Problem List Items Addressed This Visit     Aortic atherosclerosis (HCC)   Factor V Leiden (HCC)   Long term (current) use of  anticoagulants   Pain and swelling of right lower leg - Primary   Relevant Orders   C-reactive protein (Completed)   CBC with Differential/Platelet (Completed)   Sedimentation rate (Completed)   DG Tibia/Fibula Right (Completed)   MR TIBIA FIBULA RIGHT W CONTRAST   Other Visit Diagnoses       Venous stasis dermatitis of right lower extremity       Relevant Medications   Dietary Management Product (VASCULERA) TABS   Other Relevant Orders   C-reactive protein (Completed)   CBC with Differential/Platelet (Completed)   Sedimentation rate (Completed)     Atypical nevus of back       Relevant Orders  Ambulatory referral to Dermatology      Assessment and Plan Assessment & Plan Type 2 diabetes mellitus, uncontrolled Uncontrolled type 2 diabetes mellitus with recent A1c of 8.4% as of July 09, 2023, up from 7.1% five months prior. Blood sugar readings at home have shown improvement with dietary changes, with most readings under 180 mg/dL. She is currently on Farxiga  10 mg daily, which she tolerates well. She has not been started on metformin due to family history of adverse reactions. - Continue Farxiga  10 mg daily. - Instruct to monitor blood sugar levels, aiming for fasting levels between 90-130 mg/dL and postprandial levels under 180 mg/dL. - Educate on checking blood sugar two hours postprandial for accurate readings. - Reinforce dietary modifications to maintain blood sugar control.  Venous stasis dermatitis with right lower leg ulcer Persistent pain and swelling in the right lower leg with venous stasis dermatitis. The area is more tender than typical for venous stasis, raising concern for possible osteomyelitis. ABI testing showed normal blood flow, ruling out PAD. - Order plain x-ray of the right lower leg to rule out osteomyelitis. X ray shows mild diffuse subcutaneous edema. STAT MRI w w/o ordered to rule out infection - Order sed rate and CRP to assess for inflammation and both  are WNL - Prescribe potent topical steroid ointment for venous stasis dermatitis. - Prescribe Vasculera from a specialty pharmacy to alleviate symptoms. - Advise application of topical steroid ointment in the morning and before bed, avoiding application before swimming.  Paroxysmal atrial fibrillation Paroxysmal atrial fibrillation managed with Xarelto .  History of pulmonary embolism with Factor V Leiden mutation Pulmonary embolism with Factor V Leiden mutation, managed with Xarelto .  Growing mole on back, referred to dermatology Growing mole on the back, described as likely benign but referred to dermatology for further evaluation due to recent growth. - Refer to dermatology for evaluation of the growing mole on the back.    I am having Carling L. Housman start on Vasculera and betamethasone  valerate ointment. I am also having her maintain her acetaminophen , atorvastatin , diltiazem , Xarelto , Vitamin D  (Cholecalciferol), Symbicort , albuterol , lisinopril , Blood Glucose Monitoring Suppl, BLOOD GLUCOSE TEST STRIPS, Farxiga , gabapentin , and Spiriva  Respimat.  Meds ordered this encounter  Medications   Dietary Management Product (VASCULERA) TABS    Sig: Take 630 mg by mouth daily.    Dispense:  30 tablet    Refill:  2    Supervising Provider:   ROLLENE NORRIS A [[id number]]   betamethasone  valerate ointment (VALISONE ) 0.1 %    Sig: Apply 1 Application topically 2 (two) times daily.    Dispense:  30 g    Refill:  0    Supervising Provider:   ROLLENE NORRIS A [[id number]]

## 2023-08-21 NOTE — Progress Notes (Signed)
 X-ray shows mild subcutaneous edema.  I will order an MRI to take a closer look

## 2023-08-21 NOTE — Patient Instructions (Signed)
 Please go downstairs for labs and an x-ray of your lower leg.  Use the steroid ointment as prescribed  I also prescribed you medication called Vasculera that is for chronic venous disease.  This was sent to a specialty pharmacy called [facility] and they may reach out to you about this medication.  Let me know if it is not affordable.  I will be in touch with your results  I referred you to [facility] dermatology and they will call to schedule you

## 2023-08-21 NOTE — Progress Notes (Signed)
 Her labs are fine.  Her x-ray shows some mild soft tissue/subcutaneous swelling.  I am going to order an MRI of her leg to take a closer look.  They will call her to get this done.

## 2023-08-22 ENCOUNTER — Other Ambulatory Visit: Payer: Self-pay | Admitting: Family Medicine

## 2023-08-22 ENCOUNTER — Ambulatory Visit (HOSPITAL_COMMUNITY)
Admission: RE | Admit: 2023-08-22 | Discharge: 2023-08-22 | Disposition: A | Discharge status: Home / Self Care | Source: Ambulatory Visit | Attending: Family Medicine | Admitting: Family Medicine

## 2023-08-22 DIAGNOSIS — M79661 Pain in right lower leg: Secondary | ICD-10-CM | POA: Diagnosis not present

## 2023-08-22 DIAGNOSIS — M7989 Other specified soft tissue disorders: Secondary | ICD-10-CM | POA: Insufficient documentation

## 2023-08-22 DIAGNOSIS — R601 Generalized edema: Secondary | ICD-10-CM | POA: Diagnosis not present

## 2023-08-22 DIAGNOSIS — M79604 Pain in right leg: Secondary | ICD-10-CM | POA: Diagnosis not present

## 2023-08-22 MED ORDER — GADOBUTROL 1 MMOL/ML IV SOLN
8.0000 mL | Freq: Once | INTRAVENOUS | Status: AC | PRN
  Administered 2023-08-22: 8 mL via INTRAVENOUS

## 2023-08-22 MED ORDER — AMOXICILLIN-POT CLAVULANATE 875-125 MG PO TABS: 1.0000 | ORAL_TABLET | Freq: Two times a day (BID) | ORAL | 0 refills | Status: DC

## 2023-08-22 NOTE — Addendum Note (Signed)
 Addended by: Burt Piatek L on: 08/22/2023 07:57 AM   Modules accepted: Orders

## 2023-08-22 NOTE — Progress Notes (Signed)
 Called and spoke with patient. Gave her results and sent antibiotic to her pharmacy for possible cellulitis per MRI. Please check on her Monday to see how she is doing. Thanks.

## 2023-09-01 DIAGNOSIS — E119 Type 2 diabetes mellitus without complications: Secondary | ICD-10-CM | POA: Diagnosis not present

## 2023-09-10 ENCOUNTER — Other Ambulatory Visit: Payer: Self-pay | Admitting: Family Medicine

## 2023-09-18 ENCOUNTER — Ambulatory Visit (INDEPENDENT_AMBULATORY_CARE_PROVIDER_SITE_OTHER): Admitting: Family Medicine

## 2023-09-18 ENCOUNTER — Encounter: Payer: Self-pay | Admitting: Family Medicine

## 2023-09-18 VITALS — BP 126/80 | HR 65 | Temp 97.6°F | Ht 64.0 in | Wt 184.0 lb

## 2023-09-18 DIAGNOSIS — N1831 Chronic kidney disease, stage 3a: Secondary | ICD-10-CM

## 2023-09-18 DIAGNOSIS — I872 Venous insufficiency (chronic) (peripheral): Secondary | ICD-10-CM | POA: Diagnosis not present

## 2023-09-18 DIAGNOSIS — E1122 Type 2 diabetes mellitus with diabetic chronic kidney disease: Secondary | ICD-10-CM | POA: Diagnosis not present

## 2023-09-18 DIAGNOSIS — M79661 Pain in right lower leg: Secondary | ICD-10-CM

## 2023-09-18 DIAGNOSIS — M7989 Other specified soft tissue disorders: Secondary | ICD-10-CM

## 2023-09-18 MED ORDER — GABAPENTIN 300 MG PO CAPS: 300.0000 mg | ORAL_CAPSULE | Freq: Two times a day (BID) | ORAL | 1 refills | Status: AC

## 2023-09-18 NOTE — Patient Instructions (Signed)
 I am increasing your gabapentin  to 300 mg twice daily.  Continue elevating your leg as needed.  Continue the topical medication until you see Ortho.   I placed an urgent referral to Ortho care and they will call you.   If you notice any new or worsening symptoms in the meantime, please call and let us  know or go to an urgent care if it is after hours.

## 2023-09-18 NOTE — Progress Notes (Signed)
 Subjective:     Patient ID: Kimberly Walters, female    DOB: July 07, 1950, 73 y.o.   MRN: [id number]  Chief Complaint  Patient presents with   leg redness    Right leg has been red since April/May, was here last time and was given cream and abx. Still has not gone away and has constant pain    HPI  Discussed the use of AI scribe software for clinical note transcription with the patient, who gave verbal consent to proceed.  History of Present Illness Kimberly Walters is a 73 year old female with diabetes who presents with right lower extremity redness and pain.  Right lower extremity pain and erythema - Persistent redness and burning pain in the right lower extremity - Pain described as a sensation of being bitten or shot, and sometimes like hot liquid being injected - Pain disrupts sleep, requiring ambulation at night - No fever or chills - Scar present from previous crush injury on the same leg   Prior treatments and response - Completed 2 courses of antibiotics without significant improvement - Using a topical cream without significant improvement - Gabapentin  100 mg at bedtime is ineffective - Tylenol  500 mg provides 2-3 hours of sleep relief - Currently taking Vasculera, started in July   Diabetes mellitus and glycemic control - Recent hemoglobin A1c of 8.4% - Blood glucose levels improving to 89-130 mg/dL, with a recent high of 190 mg/dL  Prior diagnostic evaluation - MRI done to rule out osteomyelitis. 1. Generalized nonspecific circumferential subcutaneous edema extending from the proximal calf through the ankle with suggestion of mild enhancement distally. These findings could reflect cellulitis in the appropriate clinical setting. No loculated fluid collection. No acute osseous abnormality. - Normal ankle-brachial index (ABI) testing - Plain x-ray shows mild diffuse subcutaneous edema - Normal inflammatory markers, including sed rate and CRP     Health Maintenance  Due  Topic Date Due   FOOT EXAM  Never done   Zoster Vaccines- Shingrix (1 of 2) Never done   OPHTHALMOLOGY EXAM  04/22/2023   MAMMOGRAM  05/23/2023   COVID-19 Vaccine (6 - Pfizer risk 2024-25 season) 05/27/2023   INFLUENZA VACCINE  08/22/2023    Past Medical History:  Diagnosis Date   Anticoagulant-induced bleeding (HCC)    Aortic atherosclerosis (HCC)    Bereavement 11/16/2018   Cardiac arrest (HCC) 1989   pt states her heart stopped and they had to shock her heart and restarted it (w/ Dr. Lavon)   Clotting disorder (HCC)    Diabetes mellitus without complication (HCC)    Emphysema, unspecified (HCC)    Factor V Leiden mutation (HCC)    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    GERD (gastroesophageal reflux disease)    Gross hematuria    History of hiatal hernia    Hyperlipidemia    Hypertension    Mitral valve prolapse    Nodule of lower lobe of left lung    Paroxysmal atrial fibrillation with RVR (HCC)    Pleural effusion on left    Post-thrombotic syndrome of left lower extremity    Primary hypercoagulable state (HCC)    Pulmonary emboli (HCC)    Pyelonephritis 2021   Upper respiratory infection, viral 04/02/2021    Past Surgical History:  Procedure Laterality Date   BIOPSY  07/19/2020   Procedure: BIOPSY;  Surgeon: Shila Gustav GAILS, MD;  Location: [facility];  Service: Endoscopy;;   CARDIAC CATHETERIZATION     pt states she had  one in 1989 and 1996 w/ Dr. Lavon   COLONOSCOPY     ESOPHAGOGASTRODUODENOSCOPY (EGD) WITH PROPOFOL  N/A 07/19/2020   Procedure: ESOPHAGOGASTRODUODENOSCOPY (EGD) WITH PROPOFOL ;  Surgeon: Shila Gustav GAILS, MD;  Location: [facility];  Service: Endoscopy;  Laterality: N/A;   FUDUCIAL PLACEMENT N/A 03/06/2023   Procedure: PLACEMENT OF FUDUCIAL;  Surgeon: Kerrin Elspeth BROCKS, MD;  Location: [facility] OR;  Service: Thoracic;  Laterality: N/A;   HYSTERECTOMY ABDOMINAL WITH SALPINGO-OOPHORECTOMY  1975   LYMPH NODE BIOPSY Right 1983   Neck    VIDEO BRONCHOSCOPY WITH ENDOBRONCHIAL NAVIGATION N/A 03/06/2023   Procedure: VIDEO BRONCHOSCOPY WITH ENDOBRONCHIAL NAVIGATION;  Surgeon: Kerrin Elspeth BROCKS, MD;  Location: [facility] OR;  Service: Thoracic;  Laterality: N/A;    Family History  Problem Relation Age of Onset   Diabetes Mother    Heart disease Mother    Cancer Mother        Uterine   Diabetes Father    CAD Father    Heart attack Father    Hypothyroidism Sister    Melanoma Sister    Clotting disorder Sister    Stomach cancer Sister    Diabetes Sister    Diabetes Sister    Hypertension Sister    Hypothyroidism Sister    Diabetes Sister    Hypertension Sister    Hypothyroidism Sister    Cancer Sister    Cancer Sister    Diabetes Daughter    Diabetes Son    Diabetes Son    Breast cancer Half-Sister    Pancreatic cancer Nephew    Uterine cancer Niece    Ovarian cancer Other    Uterine cancer Other    Breast cancer Other    Colon cancer Neg Hx    Esophageal cancer Neg Hx     Social History   Socioeconomic History   Marital status: Widowed    Spouse name: Not on file   Number of children: 3   Years of education: Not on file   Highest education level: GED or equivalent  Occupational History   Occupation: Retired    Associate Professor: [employer]  Tobacco Use   Smoking status: Former    Current packs/day: 0.00    Average packs/day: 0.5 packs/day for 55.0 years (27.5 ttl pk-yrs)    Types: Cigarettes    Quit date: 07/01/2019    Years since quitting: 4.2   Smokeless tobacco: Never  Vaping Use   Vaping status: Never Used  Substance and Sexual Activity   Alcohol use: No   Drug use: No   Sexual activity: Yes    Birth control/protection: Surgical, Post-menopausal  Other Topics Concern   Not on file  Social History Narrative   Current Social History 09/12/2020      Patient lives with 2 sons and granddaughter in a one story home. There are 3 steps with handrail up to the entrance the patient uses.       Patient's method of  transportation is personal car.      The highest level of education was GED      The patient currently retired from [employer].      Identified important Relationships are my children, my grandchildren, my great-grandchildren, and my sisters      Pets : dog and Brewing technologist / Fun: read, watch movies, go walking, and just do things with the children.      Current Stressors: dog      Religious / Personal Beliefs:  Old fashioned [employer].       Pt lives with her 2 sons and granddaughter-2025            Social Drivers of Health   Financial Resource Strain: Medium Risk (01/29/2023)   Overall Financial Resource Strain (CARDIA)    Difficulty of Paying Living Expenses: Somewhat hard  Food Insecurity: No Food Insecurity (01/29/2023)   Hunger Vital Sign    Worried About Running Out of Food in the Last Year: Never true    Ran Out of Food in the Last Year: Never true  Transportation Needs: No Transportation Needs (01/29/2023)   PRAPARE - Administrator, Civil Service (Medical): No    Lack of Transportation (Non-Medical): No  Physical Activity: Inactive (01/29/2023)   Exercise Vital Sign    Days of Exercise per Week: 0 days    Minutes of Exercise per Session: 0 min  Stress: No Stress Concern Present (01/29/2023)   [employer] of Occupational Health - Occupational Stress Questionnaire    Feeling of Stress : Not at all  Social Connections: Moderately Isolated (01/29/2023)   Social Connection and Isolation Panel    Frequency of Communication with Friends and Family: More than three times a week    Frequency of Social Gatherings with Friends and Family: Three times a week    Attends Religious Services: 1 to 4 times per year    Active Member of Clubs or Organizations: No    Attends Banker Meetings: Never    Marital Status: Widowed  Intimate Partner Violence: Patient Unable To Answer (01/29/2023)   Humiliation, Afraid, Rape, and Kick questionnaire     Fear of Current or Ex-Partner: Patient unable to answer    Emotionally Abused: Patient unable to answer    Physically Abused: Patient unable to answer    Sexually Abused: Patient unable to answer    Outpatient Medications Prior to Visit  Medication Sig Dispense Refill   acetaminophen  (TYLENOL ) 500 MG tablet Take 500 mg by mouth every 6 (six) hours as needed for moderate pain.     albuterol  (VENTOLIN  HFA) 108 (90 Base) MCG/ACT inhaler INHALE 2 PUFFS BY MOUTH EVERY 6 HOURS AS NEEDED FOR WHEEZING OR SHORTNESS OF BREATH 18 g 0   atorvastatin  (LIPITOR) 20 MG tablet Take 1 tablet (20 mg total) by mouth daily. 90 tablet 3   betamethasone  valerate ointment (VALISONE ) 0.1 % Apply 1 Application topically 2 (two) times daily. 30 g 0   Blood Glucose Monitoring Suppl DEVI 1 each by Does not apply route in the morning, at noon, and at bedtime. May substitute to any manufacturer covered by patient's insurance. 1 each 0   Dietary Management Product (VASCULERA) TABS Take 630 mg by mouth daily. 30 tablet 2   diltiazem  (DILACOR XR ) 120 MG 24 hr capsule Take 1 capsule (120 mg total) by mouth daily. 90 capsule 3   FARXIGA  10 MG TABS tablet Take 1 tablet (10 mg total) by mouth daily. 30 tablet 2   Glucose Blood (BLOOD GLUCOSE TEST STRIPS) STRP 1 each by In Vitro route 2 (two) times daily. May substitute to any manufacturer covered by patient's insurance. 600 strip 0   lisinopril  (ZESTRIL ) 40 MG tablet Take 1 tablet by mouth once daily 90 tablet 0   rivaroxaban  (XARELTO ) 20 MG TABS tablet TAKE 1 TABLET BY MOUTH ONCE DAILY WITH SUPPER 90 tablet 1   SYMBICORT  80-4.5 MCG/ACT inhaler Inhale 2 puffs by mouth twice daily 11 g 1  Tiotropium Bromide Monohydrate  (SPIRIVA  RESPIMAT) 2.5 MCG/ACT AERS INHALE 2 SPRAY(S) BY MOUTH ONCE DAILY AT  8  PM. 4 g 2   Vitamin D , Cholecalciferol, 25 MCG (1000 UT) TABS Take 1 tablet by mouth once daily 90 tablet 0   gabapentin  (NEURONTIN ) 100 MG capsule Take 1 capsule (100 mg total) by mouth  2 (two) times daily. 60 capsule 0   amoxicillin -clavulanate (AUGMENTIN ) 875-125 MG tablet Take 1 tablet by mouth 2 (two) times daily. 20 tablet 0   No facility-administered medications prior to visit.    No Known Allergies  Review of Systems  Constitutional:  Negative for chills and fever.  Respiratory:  Negative for shortness of breath.   Cardiovascular:  Positive for leg swelling. Negative for chest pain and palpitations.  Gastrointestinal:  Negative for nausea and vomiting.  Musculoskeletal:  Positive for myalgias.  Neurological:  Negative for dizziness and focal weakness.       Objective:    Physical Exam Constitutional:      General: She is not in acute distress.    Appearance: She is not ill-appearing.  Eyes:     Extraocular Movements: Extraocular movements intact.     Conjunctiva/sclera: Conjunctivae normal.  Cardiovascular:     Rate and Rhythm: Normal rate.  Pulmonary:     Effort: Pulmonary effort is normal.  Musculoskeletal:        General: Swelling present.     Cervical back: Normal range of motion and neck supple.     Right lower leg: Edema present.     Comments: RLE is neurovascularly intact   Skin:    General: Skin is warm and dry.     Findings: Erythema present.  Neurological:     General: No focal deficit present.     Mental Status: She is alert and oriented to person, place, and time.     Motor: No weakness.     Coordination: Coordination normal.     Gait: Gait abnormal.  Psychiatric:        Mood and Affect: Mood normal.        Behavior: Behavior normal.        Thought Content: Thought content normal.      BP 126/80   Pulse 65   Temp 97.6 F (36.4 C) (Temporal)   Ht 5' 4 (1.626 m)   Wt 184 lb (83.5 kg)   LMP 06/21/1973   SpO2 92% Comment: w/o oxygen   BMI 31.58 kg/m  Wt Readings from Last 3 Encounters:  09/18/23 184 lb (83.5 kg)  08/21/23 183 lb (83 kg)  07/14/23 186 lb 3.2 oz (84.5 kg)       Assessment & Plan:   Problem List  Items Addressed This Visit     Pain and swelling of right lower leg - Primary   Relevant Orders   Ambulatory referral to Orthopedic Surgery   Type 2 diabetes mellitus with chronic kidney disease, without long-term current use of insulin (HCC)   Other Visit Diagnoses       Venous stasis dermatitis of right lower extremity       Relevant Orders   Ambulatory referral to Orthopedic Surgery       Assessment and Plan Assessment & Plan Right lower leg pain and redness Persistent right lower leg pain and redness following a crush injury years ago. Previous treatments with antibiotics and potent topical steroids were ineffective. Normal ABI testing, x-ray showing mild diffuse subcutaneous edema, and normal inflammatory markers. MRI ruled out osteomyelitis.  Current management is ineffective, necessitating further specialist evaluation. - Increase gabapentin  dosage to improve sleep and manage pain. - Refer to Dr. Harden at [facility] for further evaluation and potential biopsy. - If unable to see Dr. Harden promptly, consider seeing PA or NP. - Return for evaluation if symptoms worsen before specialist appointment.  Chronic peripheral venous insufficiency with stasis dermatitis Chronic peripheral venous insufficiency with stasis dermatitis contributing to right lower leg symptoms. Previous ABI testing was normal. -Continue Vasculera   Type 2 diabetes mellitus Type 2 diabetes mellitus with recent improvement in blood sugar levels. Previous A1c was 8.4%. -continue working on diet to lower blood sugars and continue medications     I have discontinued Jenyfer L. Guerrera's gabapentin  and amoxicillin -clavulanate. I am also having her start on gabapentin . Additionally, I am having her maintain her acetaminophen , atorvastatin , diltiazem , Xarelto , Vitamin D  (Cholecalciferol), albuterol , lisinopril , Blood Glucose Monitoring Suppl, BLOOD GLUCOSE TEST STRIPS, Farxiga , Spiriva  Respimat, Vasculera, betamethasone   valerate ointment, and Symbicort .  Meds ordered this encounter  Medications   gabapentin  (NEURONTIN ) 300 MG capsule    Sig: Take 1 capsule (300 mg total) by mouth 2 (two) times daily.    Dispense:  60 capsule    Refill:  1    Supervising Provider:   ROLLENE NORRIS A [[id number]]

## 2023-09-23 ENCOUNTER — Ambulatory Visit (INDEPENDENT_AMBULATORY_CARE_PROVIDER_SITE_OTHER): Admitting: Physician Assistant

## 2023-09-23 ENCOUNTER — Encounter: Payer: Self-pay | Admitting: Physician Assistant

## 2023-09-23 DIAGNOSIS — I872 Venous insufficiency (chronic) (peripheral): Secondary | ICD-10-CM | POA: Diagnosis not present

## 2023-09-23 NOTE — Progress Notes (Signed)
 Office Visit Note   Patient: Kimberly Walters           Date of Birth: Jan 30, 1950           MRN: [id number] Visit Date: 09/23/2023              Requested by: Lendia Boby LITTIE, NP-C [address] [place],  [place] [address] PCP: Lendia Boby LITTIE, NP-C  Chief Complaint  Patient presents with   Right Leg - Skin Discoloration      HPI: 73 y/o female with history of left leg swelling.  Patient notes, onset of swelling 07/20/19 associated with left LE DVT and B PE.SABRA  She has a history of Afib. Failed initial treatment with Eliquis  given worsening of PE despite full compliance with DOAC.  She was then on Lovenox  and referred to Hematology for work up.   She now takes Xarelto  daily.  The patient has had positive history of left LE DVT, no history of varicose vein she does have raticular vaiens on B LE, no history of venous stasis ulcers, no history of  Lymphedema and positive reddish of skin changes in right lower legs.   The patient has  used compression stockings in the past, but does not tolerate it well.    She has been on oral antibiotics x 2 rounds.  She states the edema is improved in the morning and becomes more swollen throughout the day.  She has a recently new diagnosis of lung cancer.  She just finished radiation and uses portable O2 as needed.    Assessment & Plan: Visit Diagnoses:  1. Venous insufficiency (chronic) (peripheral)     Plan: Elevation daily, handout given.  Compression prescribed for daily use.  Water  therapy if available.  I will order a venous reflux study and she will f/u after the study to review.    Follow-Up Instructions: Return if symptoms worsen or fail to improve and call after she has her venous reflux study.SABRA Beers Exam  Patient is alert, oriented, no adenopathy, well-dressed, normal affect, normal respiratory effort. Mile reddish skin discoloration with pitting edema from the knee to the ankle.  Multiple telangiectasias around the ankle and foot.   Strength in 5/5 plantar and dorsiflexion.  Palpable pedal pulses B LE.  No dorsal foot edema noted.  The right calf measured 37 cm and the right measures 36 cm.      Imaging:   Labs: Lab Results  Component Value Date   HGBA1C 8.4 (H) 07/09/2023   HGBA1C 7.1 (H) 03/03/2023   HGBA1C 7.3 (H) 12/24/2022   ESRSEDRATE 9 08/21/2023   ESRSEDRATE 11 02/13/2023   ESRSEDRATE 2 07/17/2021   CRP <1.0 08/21/2023   CRP 9 07/17/2021   REPTSTATUS 08/04/2019 FINAL 07/30/2019   REPTSTATUS 08/04/2019 FINAL 07/30/2019   CULT  07/30/2019    NO GROWTH 5 DAYS Performed at [facility], [address], [place], [place] [address]    CULT  07/30/2019    NO GROWTH 5 DAYS Performed at [facility], [address] [place], [place] [address]      Lab Results  Component Value Date   ALBUMIN 4.1 07/14/2023   ALBUMIN 4.1 07/09/2023   ALBUMIN 4.3 03/31/2023    Lab Results  Component Value Date   MG 1.7 07/09/2023   MG 1.9 12/24/2022   MG 2.1 07/18/2020   Lab Results  Component Value Date   VD25OH 32.50 07/09/2023   VD25OH  31.64 12/24/2022   VD25OH 33.8 01/04/2022    No results found for: PREALBUMIN    Latest Ref Rng & Units 08/21/2023   10:34 AM 07/14/2023   10:35 AM 07/09/2023    8:57 AM  CBC EXTENDED  WBC 4.0 - 10.5 K/uL 4.8  8.0  8.2   RBC 3.87 - 5.11 Mil/uL 4.59  4.34  4.53   Hemoglobin 12.0 - 15.0 g/dL 85.8  86.3  86.0   HCT 36.0 - 46.0 % 43.1  40.5  42.3   Platelets 150.0 - 400.0 K/uL 179.0  186  193.0   NEUT# 1.4 - 7.7 K/uL 2.9  5.7  5.8   Lymph# 0.7 - 4.0 K/uL 1.1  1.3  1.4      There is no height or weight on file to calculate BMI.  Orders:  No orders of the defined types were placed in this encounter.  No orders of the defined types were placed in this encounter.    Procedures: No procedures performed  Clinical Data: No additional findings.  ROS:  All other systems negative, except as noted in the HPI. Review of Systems  Objective: Vital  Signs: LMP 06/21/1973   Specialty Comments:  No specialty comments available.  PMFS History: Patient Active Problem List   Diagnosis Date Noted   Former smoker 07/09/2023   Pain and swelling of right lower leg 07/09/2023   Decreased pedal pulses 07/09/2023   Hypercoagulable state (HCC) 07/01/2023   Leg swelling 07/01/2023   Cellulitis of right lower extremity 07/01/2023   Malignant neoplasm of lower lobe of left lung (HCC) 03/20/2023   Solitary pulmonary nodule on lung CT 02/12/2023   Exercise hypoxemia 02/12/2023   Nausea 01/17/2023   History of Helicobacter pylori infection 01/17/2023   Type 2 diabetes mellitus with chronic kidney disease, without long-term current use of insulin (HCC) 01/08/2023   DOE (dyspnea on exertion) 12/24/2022   Vitamin D  deficiency 12/24/2022   History of pulmonary embolism 12/24/2022   Smokes 12/24/2022   Right upper quadrant abdominal tenderness without rebound tenderness 12/24/2022   Right upper quadrant abdominal pain with positive Murphy Sign 12/24/2022   Palpitations 12/24/2022   Eye swelling 07/31/2022   Factor V Leiden (HCC) 07/31/2022   Acute rhinosinusitis 03/29/2022   Osteoporosis 01/04/2022   Prediabetes 10/18/2021   Chronic petechia bilateral lower extremity 07/25/2021   Abdominal pain, epigastric 07/04/2020   Asymptomatic varicose veins of both lower extremities 02/18/2020   Bloating 10/15/2019   Aortic atherosclerosis (HCC) 08/30/2019   Long term (current) use of anticoagulants 07/22/2019   Pulmonary embolism (HCC) 07/20/2019   Paroxysmal atrial fibrillation with RVR (HCC)    COPD GOLD 2 with ex desats 07/13/2019   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 11/17/2018   Post-thrombotic syndrome of left lower extremity 11/16/2018   Hypertension 11/16/2018   Mitral valve prolapse 11/16/2018   Tobacco use 11/16/2018   Past Medical History:  Diagnosis Date   Anticoagulant-induced bleeding (HCC)    Aortic atherosclerosis (HCC)     Bereavement 11/16/2018   Cardiac arrest (HCC) 1989   pt states her heart stopped and they had to shock her heart and restarted it (w/ Dr. Lavon)   Clotting disorder (HCC)    Diabetes mellitus without complication (HCC)    Emphysema, unspecified (HCC)    Factor V Leiden mutation (HCC)    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    GERD (gastroesophageal reflux disease)    Gross hematuria    History of hiatal hernia  Hyperlipidemia    Hypertension    Mitral valve prolapse    Nodule of lower lobe of left lung    Paroxysmal atrial fibrillation with RVR (HCC)    Pleural effusion on left    Post-thrombotic syndrome of left lower extremity    Primary hypercoagulable state (HCC)    Pulmonary emboli (HCC)    Pyelonephritis 2021   Upper respiratory infection, viral 04/02/2021    Family History  Problem Relation Age of Onset   Diabetes Mother    Heart disease Mother    Cancer Mother        Uterine   Diabetes Father    CAD Father    Heart attack Father    Hypothyroidism Sister    Melanoma Sister    Clotting disorder Sister    Stomach cancer Sister    Diabetes Sister    Diabetes Sister    Hypertension Sister    Hypothyroidism Sister    Diabetes Sister    Hypertension Sister    Hypothyroidism Sister    Cancer Sister    Cancer Sister    Diabetes Daughter    Diabetes Son    Diabetes Son    Breast cancer Half-Sister    Pancreatic cancer Nephew    Uterine cancer Niece    Ovarian cancer Other    Uterine cancer Other    Breast cancer Other    Colon cancer Neg Hx    Esophageal cancer Neg Hx     Past Surgical History:  Procedure Laterality Date   BIOPSY  07/19/2020   Procedure: BIOPSY;  Surgeon: Shila Gustav GAILS, MD;  Location: [facility];  Service: Endoscopy;;   CARDIAC CATHETERIZATION     pt states she had one in 1989 and 1996 w/ Dr. Lavon   COLONOSCOPY     ESOPHAGOGASTRODUODENOSCOPY (EGD) WITH PROPOFOL  N/A 07/19/2020   Procedure: ESOPHAGOGASTRODUODENOSCOPY (EGD) WITH  PROPOFOL ;  Surgeon: Shila Gustav GAILS, MD;  Location: [facility];  Service: Endoscopy;  Laterality: N/A;   FUDUCIAL PLACEMENT N/A 03/06/2023   Procedure: PLACEMENT OF FUDUCIAL;  Surgeon: Kerrin Elspeth BROCKS, MD;  Location: [facility] OR;  Service: Thoracic;  Laterality: N/A;   HYSTERECTOMY ABDOMINAL WITH SALPINGO-OOPHORECTOMY  1975   LYMPH NODE BIOPSY Right 1983   Neck   VIDEO BRONCHOSCOPY WITH ENDOBRONCHIAL NAVIGATION N/A 03/06/2023   Procedure: VIDEO BRONCHOSCOPY WITH ENDOBRONCHIAL NAVIGATION;  Surgeon: Kerrin Elspeth BROCKS, MD;  Location: [facility] OR;  Service: Thoracic;  Laterality: N/A;   Social History   Occupational History   Occupation: Retired    Associate Professor: [employer]  Tobacco Use   Smoking status: Former    Current packs/day: 0.00    Average packs/day: 0.5 packs/day for 55.0 years (27.5 ttl pk-yrs)    Types: Cigarettes    Quit date: 07/01/2019    Years since quitting: 4.2   Smokeless tobacco: Never  Vaping Use   Vaping status: Never Used  Substance and Sexual Activity   Alcohol use: No   Drug use: No   Sexual activity: Yes    Birth control/protection: Surgical, Post-menopausal

## 2023-09-29 ENCOUNTER — Ambulatory Visit (HOSPITAL_COMMUNITY)
Admission: RE | Admit: 2023-09-29 | Discharge: 2023-09-29 | Disposition: A | Discharge status: Home / Self Care | Source: Ambulatory Visit | Attending: Physician Assistant | Admitting: Physician Assistant

## 2023-09-29 ENCOUNTER — Other Ambulatory Visit: Payer: Self-pay | Admitting: Family Medicine

## 2023-09-29 DIAGNOSIS — I872 Venous insufficiency (chronic) (peripheral): Secondary | ICD-10-CM | POA: Insufficient documentation

## 2023-09-30 ENCOUNTER — Other Ambulatory Visit: Payer: Self-pay | Admitting: Family Medicine

## 2023-09-30 DIAGNOSIS — I10 Essential (primary) hypertension: Secondary | ICD-10-CM

## 2023-10-02 ENCOUNTER — Ambulatory Visit: Admitting: Physician Assistant

## 2023-10-02 DIAGNOSIS — L539 Erythematous condition, unspecified: Secondary | ICD-10-CM

## 2023-10-02 DIAGNOSIS — L03115 Cellulitis of right lower limb: Secondary | ICD-10-CM | POA: Diagnosis not present

## 2023-10-02 MED ORDER — DOXYCYCLINE HYCLATE 100 MG PO TABS: 100.0000 mg | ORAL_TABLET | Freq: Two times a day (BID) | ORAL | 0 refills | Status: DC

## 2023-10-02 NOTE — Progress Notes (Unsigned)
 Office Visit Note   Patient: Kimberly Walters           Date of Birth: [birth year]           MRN: [id number] Visit Date: 10/02/2023              Requested by: Lendia Boby LITTIE, NP-C [address] [place],  [place] [address] PCP: Lendia Boby LITTIE, NP-C  Chief Complaint  Patient presents with  . Right Leg - Follow-up      HPI: 73 y/o female with history of right LE minimal erythema, mild edema since May of 2025.  She is a newly diagnosed diabetic, A1c 8.4.  She has normal ABI's with palpable pedal pulses.  No evidence of DVT or venous reflux.  She has been on 2 rounds of oral antibiotics which she stated helps some.  I recently placed her in [employer] [place] wool compression socks.  She developed small blistering and had an allergic reaction.  She discontinued the wool compression socks.    She and her sister are fearful of limb loss because her father had to have his leg amputated.  She and her sister are concerned.  She states she has right anterior lower leg redness and swelling.    Assessment & Plan: Visit Diagnoses:  1. Cellulitis of right lower limb     Plan: ***  Follow-Up Instructions: No follow-ups on file.   Ortho Exam  Patient is alert, oriented, no adenopathy, well-dressed, normal affect, normal respiratory effort.     Imaging: No results found. No images are attached to the encounter.  Labs: Lab Results  Component Value Date   HGBA1C 8.4 (H) 07/09/2023   HGBA1C 7.1 (H) 03/03/2023   HGBA1C 7.3 (H) 12/24/2022   ESRSEDRATE 9 08/21/2023   ESRSEDRATE 11 02/13/2023   ESRSEDRATE 2 07/17/2021   CRP <1.0 08/21/2023   CRP 9 07/17/2021   REPTSTATUS 08/04/2019 FINAL 07/30/2019   REPTSTATUS 08/04/2019 FINAL 07/30/2019   CULT  07/30/2019    NO GROWTH 5 DAYS Performed at [facility], [address], [place], [place] [address]    CULT  07/30/2019    NO GROWTH 5 DAYS Performed at [facility], [address], [place], [place] [address]      Lab  Results  Component Value Date   ALBUMIN 4.1 07/14/2023   ALBUMIN 4.1 07/09/2023   ALBUMIN 4.3 03/31/2023    Lab Results  Component Value Date   MG 1.7 07/09/2023   MG 1.9 12/24/2022   MG 2.1 07/18/2020   Lab Results  Component Value Date   VD25OH 32.50 07/09/2023   VD25OH 31.64 12/24/2022   VD25OH 33.8 01/04/2022    No results found for: PREALBUMIN    Latest Ref Rng & Units 08/21/2023   10:34 AM 07/14/2023   10:35 AM 07/09/2023    8:57 AM  CBC EXTENDED  WBC 4.0 - 10.5 K/uL 4.8  8.0  8.2   RBC 3.87 - 5.11 Mil/uL 4.59  4.34  4.53   Hemoglobin 12.0 - 15.0 g/dL 85.8  86.3  86.0   HCT 36.0 - 46.0 % 43.1  40.5  42.3   Platelets 150.0 - 400.0 K/uL 179.0  186  193.0   NEUT# 1.4 - 7.7 K/uL 2.9  5.7  5.8   Lymph# 0.7 - 4.0 K/uL 1.1  1.3  1.4      There is no height or weight on file to calculate BMI.  Orders:  No orders of the defined types were placed in this encounter.  Meds ordered this encounter  Medications  . doxycycline  (VIBRA -TABS) 100 MG tablet    Sig: Take 1 tablet (100 mg total) by mouth 2 (two) times daily.    Dispense:  28 tablet    Refill:  0    Supervising Provider:   DUDA, MARCUS V [[id number]]     Procedures: No procedures performed  Clinical Data: No additional findings.  ROS:  All other systems negative, except as noted in the HPI. Review of Systems  Objective: Vital Signs: LMP 06/21/1973   Specialty Comments:  No specialty comments available.  PMFS History: Patient Active Problem List   Diagnosis Date Noted  . Former smoker 07/09/2023  . Pain and swelling of right lower leg 07/09/2023  . Decreased pedal pulses 07/09/2023  . Hypercoagulable state (HCC) 07/01/2023  . Leg swelling 07/01/2023  . Cellulitis of right lower extremity 07/01/2023  . Malignant neoplasm of lower lobe of left lung (HCC) 03/20/2023  . Solitary pulmonary nodule on lung CT 02/12/2023  . Exercise hypoxemia 02/12/2023  . Nausea 01/17/2023  . History of Helicobacter  pylori infection 01/17/2023  . Type 2 diabetes mellitus with chronic kidney disease, without long-term current use of insulin (HCC) 01/08/2023  . DOE (dyspnea on exertion) 12/24/2022  . Vitamin D  deficiency 12/24/2022  . History of pulmonary embolism 12/24/2022  . Smokes 12/24/2022  . Right upper quadrant abdominal tenderness without rebound tenderness 12/24/2022  . Right upper quadrant abdominal pain with positive Murphy Sign 12/24/2022  . Palpitations 12/24/2022  . Eye swelling 07/31/2022  . Factor V Leiden (HCC) 07/31/2022  . Acute rhinosinusitis 03/29/2022  . Osteoporosis 01/04/2022  . Prediabetes 10/18/2021  . Chronic petechia bilateral lower extremity 07/25/2021  . Abdominal pain, epigastric 07/04/2020  . Asymptomatic varicose veins of both lower extremities 02/18/2020  . Bloating 10/15/2019  . Aortic atherosclerosis (HCC) 08/30/2019  . Long term (current) use of anticoagulants 07/22/2019  . Pulmonary embolism (HCC) 07/20/2019  . Paroxysmal atrial fibrillation with RVR (HCC)   . COPD GOLD 2 with ex desats 07/13/2019  . 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 Hypertension 11/16/2018  . Mitral valve prolapse 11/16/2018  . Tobacco use 11/16/2018   Past Medical History:  Diagnosis Date  . Anticoagulant-induced bleeding (HCC)   . Aortic atherosclerosis (HCC)   . Bereavement 11/16/2018  . Cardiac arrest (HCC) 1989   pt states her heart stopped and they had to shock her heart and restarted it (w/ Dr. Lavon)  . Clotting disorder (HCC)   . Diabetes mellitus without complication (HCC)   . Emphysema, unspecified (HCC)   . Factor V Leiden mutation (HCC)   . 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 GERD (gastroesophageal reflux disease)   . Gross hematuria   . History of hiatal hernia   . Hyperlipidemia   . Hypertension   . Mitral valve prolapse   . Nodule of lower lobe of left lung   . Paroxysmal atrial  fibrillation with RVR (HCC)   . Pleural effusion on left   . Post-thrombotic syndrome of left lower extremity   . Primary hypercoagulable state (HCC)   . Pulmonary emboli (HCC)   . Pyelonephritis 2021  . Upper respiratory infection, viral 04/02/2021    Family History  Problem Relation Age of Onset  . Diabetes Mother   . Heart disease Mother   . Cancer Mother  Uterine  . Diabetes Father   . CAD Father   . Heart attack Father   . Hypothyroidism Sister   . Melanoma Sister   . Clotting disorder Sister   . Stomach cancer Sister   . Diabetes Sister   . Diabetes Sister   . Hypertension Sister   . Hypothyroidism Sister   . Diabetes Sister   . Hypertension Sister   . Hypothyroidism Sister   . Cancer Sister   . Cancer Sister   . Diabetes Daughter   . Diabetes Son   . Diabetes Son   . Breast cancer Half-Sister   . Pancreatic cancer Nephew   . Uterine cancer Niece   . Ovarian cancer Other   . Uterine cancer Other   . Breast cancer Other   . Colon cancer Neg Hx   . Esophageal cancer Neg Hx     Past Surgical History:  Procedure Laterality Date  . BIOPSY  07/19/2020   Procedure: BIOPSY;  Surgeon: Shila Gustav GAILS, MD;  Location: [facility];  Service: Endoscopy;;  . CARDIAC CATHETERIZATION     pt states she had one in 1989 and 1996 w/ Dr. Lavon  . COLONOSCOPY    . ESOPHAGOGASTRODUODENOSCOPY (EGD) WITH PROPOFOL  N/A 07/19/2020   Procedure: ESOPHAGOGASTRODUODENOSCOPY (EGD) WITH PROPOFOL ;  Surgeon: Shila Gustav GAILS, MD;  Location: [facility];  Service: Endoscopy;  Laterality: N/A;  . FUDUCIAL PLACEMENT N/A 03/06/2023   Procedure: PLACEMENT OF FUDUCIAL;  Surgeon: Kerrin Elspeth BROCKS, MD;  Location: [facility] OR;  Service: Thoracic;  Laterality: N/A;  . HYSTERECTOMY ABDOMINAL WITH SALPINGO-OOPHORECTOMY  1975  . LYMPH NODE BIOPSY Right 1983   Neck  . VIDEO BRONCHOSCOPY WITH ENDOBRONCHIAL NAVIGATION N/A 03/06/2023   Procedure: VIDEO BRONCHOSCOPY WITH ENDOBRONCHIAL  NAVIGATION;  Surgeon: Kerrin Elspeth BROCKS, MD;  Location: [facility] OR;  Service: Thoracic;  Laterality: N/A;   Social History   Occupational History  . Occupation: Retired    Associate Professor: [employer]  Tobacco Use  . Smoking status: Former    Current packs/day: 0.00    Average packs/day: 0.5 packs/day for 55.0 years (27.5 ttl pk-yrs)    Types: Cigarettes    Quit date: 07/01/2019    Years since quitting: 4.2  . Smokeless tobacco: Never  Vaping Use  . Vaping status: Never Used  Substance and Sexual Activity  . Alcohol use: No  . Drug use: No  . Sexual activity: Yes    Birth control/protection: Surgical, Post-menopausal

## 2023-10-03 ENCOUNTER — Other Ambulatory Visit: Payer: Self-pay | Admitting: Physician Assistant

## 2023-10-03 ENCOUNTER — Encounter: Payer: Self-pay | Admitting: Physician Assistant

## 2023-10-09 ENCOUNTER — Other Ambulatory Visit: Payer: Self-pay | Admitting: Family Medicine

## 2023-10-16 ENCOUNTER — Ambulatory Visit (INDEPENDENT_AMBULATORY_CARE_PROVIDER_SITE_OTHER): Admitting: Family Medicine

## 2023-10-16 ENCOUNTER — Encounter: Payer: Self-pay | Admitting: Family Medicine

## 2023-10-16 VITALS — BP 130/70 | HR 55 | Temp 97.5°F | Ht 64.0 in | Wt 186.4 lb

## 2023-10-16 DIAGNOSIS — I10 Essential (primary) hypertension: Secondary | ICD-10-CM

## 2023-10-16 DIAGNOSIS — I700 Atherosclerosis of aorta: Secondary | ICD-10-CM | POA: Diagnosis not present

## 2023-10-16 DIAGNOSIS — E1122 Type 2 diabetes mellitus with diabetic chronic kidney disease: Secondary | ICD-10-CM | POA: Diagnosis not present

## 2023-10-16 DIAGNOSIS — E785 Hyperlipidemia, unspecified: Secondary | ICD-10-CM

## 2023-10-16 DIAGNOSIS — I872 Venous insufficiency (chronic) (peripheral): Secondary | ICD-10-CM | POA: Diagnosis not present

## 2023-10-16 LAB — BASIC METABOLIC PANEL WITH GFR
BUN: 19 mg/dL (ref 6–23)
CO2: 25 meq/L (ref 19–32)
Calcium: 9.5 mg/dL (ref 8.4–10.5)
Chloride: 107 meq/L (ref 96–112)
Creatinine, Ser: 1.12 mg/dL (ref 0.40–1.20)
GFR: 48.65 mL/min — ABNORMAL LOW (ref >60.00)
Glucose, Bld: 125 mg/dL — ABNORMAL HIGH (ref 70–99)
Potassium: 4.2 meq/L (ref 3.5–5.1)
Sodium: 141 meq/L (ref 135–145)

## 2023-10-16 LAB — CBC WITH DIFFERENTIAL/PLATELET
Basophils Absolute: 0.1 K/uL (ref 0.0–0.1)
Basophils Relative: 0.9 % (ref 0.0–3.0)
Eosinophils Absolute: 0.2 K/uL (ref 0.0–0.7)
Eosinophils Relative: 2.9 % (ref 0.0–5.0)
HCT: 45.8 % (ref 36.0–46.0)
Hemoglobin: 14.8 g/dL (ref 12.0–15.0)
Lymphocytes Relative: 18.8 % (ref 12.0–46.0)
Lymphs Abs: 1.1 K/uL (ref 0.7–4.0)
MCHC: 32.4 g/dL (ref 30.0–36.0)
MCV: 95.1 fl (ref 78.0–100.0)
Monocytes Absolute: 0.7 K/uL (ref 0.1–1.0)
Monocytes Relative: 11.8 % (ref 3.0–12.0)
Neutro Abs: 3.8 K/uL (ref 1.4–7.7)
Neutrophils Relative %: 65.6 % (ref 43.0–77.0)
Platelets: 180 K/uL (ref 150.0–400.0)
RBC: 4.82 Mil/uL (ref 3.87–5.11)
RDW: 14.5 % (ref 11.5–15.5)
WBC: 5.8 K/uL (ref 4.0–10.5)

## 2023-10-16 LAB — LIPID PANEL
Cholesterol: 120 mg/dL (ref 0–200)
HDL: 48 mg/dL (ref >39.00)
LDL Cholesterol: 44 mg/dL (ref 0–99)
NonHDL: 72.26
Total CHOL/HDL Ratio: 3
Triglycerides: 140 mg/dL (ref 0.0–149.0)
VLDL: 28 mg/dL (ref 0.0–40.0)

## 2023-10-16 MED ORDER — BETAMETHASONE VALERATE 0.1 % EX OINT: 1.0000 | TOPICAL_OINTMENT | Freq: Two times a day (BID) | CUTANEOUS | 0 refills | Status: AC

## 2023-10-16 NOTE — Progress Notes (Signed)
 "  Subjective:     Patient ID: Kimberly Walters, female    DOB: 04/07/1950, 73 y.o.   MRN: [id number]  Chief Complaint  Patient presents with   Follow-up    8 week f/u, pt fasting, ortho dr requesting CBC to be added with labs    HPI  Discussed the use of AI scribe software for clinical note transcription with the patient, who gave verbal consent to proceed.  History of Present Illness Kimberly Walters is a 73 year old female who presents with right lower leg redness and pain.  Right lower leg erythema and pain - Flare after wearing wool compression socks, which caused blisters and irritation - Severe discomfort led to removal of socks after two days - Swelling worsened with use of an ace bandage - Redness and pain persist despite multiple courses of antibiotics, most recently doxycycline  - Pain described as burning and sharp, with occasional exacerbations - Warm washcloths and leg elevation provide symptomatic relief - Steroid salve used for burning and itching - ABIs normal  - Ultrasound and MRI of the leg have been performed  Pain management - Gabapentin  300 mg twice daily for neuropathic pain - Tylenol  used for additional pain control  Tobacco use - Quit smoking in December 2024  Glycemic control - Actively manages blood sugar levels to prevent diabetes complications - Last A1c elevated and diabetes not well controlled      Health Maintenance Due  Topic Date Due   FOOT EXAM  Never done   Zoster Vaccines- Shingrix (1 of 2) Never done   OPHTHALMOLOGY EXAM  04/22/2023   Influenza Vaccine  08/22/2023   COVID-19 Vaccine (6 - Pfizer risk 2024-25 season) 09/22/2023    Past Medical History:  Diagnosis Date   Anticoagulant-induced bleeding    Aortic atherosclerosis    Bereavement 11/16/2018   Cardiac arrest (HCC) 1989   pt states her heart stopped and they had to shock her heart and restarted it (w/ Dr. Lavon)   Clotting disorder    Diabetes mellitus without  complication (HCC)    Emphysema, unspecified (HCC)    Factor V Leiden mutation    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    GERD (gastroesophageal reflux disease)    Gross hematuria    History of hiatal hernia    Hyperlipidemia    Hypertension    Mitral valve prolapse    Nodule of lower lobe of left lung    Paroxysmal atrial fibrillation with RVR (HCC)    Pleural effusion on left    Post-thrombotic syndrome of left lower extremity    Primary hypercoagulable state    Pulmonary emboli (HCC)    Pyelonephritis 2021   Upper respiratory infection, viral 04/02/2021    Past Surgical History:  Procedure Laterality Date   BIOPSY  07/19/2020   Procedure: BIOPSY;  Surgeon: Shila Gustav GAILS, MD;  Location: [facility];  Service: Endoscopy;;   CARDIAC CATHETERIZATION     pt states she had one in 1989 and 1996 w/ Dr. Lavon   COLONOSCOPY     ESOPHAGOGASTRODUODENOSCOPY (EGD) WITH PROPOFOL  N/A 07/19/2020   Procedure: ESOPHAGOGASTRODUODENOSCOPY (EGD) WITH PROPOFOL ;  Surgeon: Shila Gustav GAILS, MD;  Location: [facility];  Service: Endoscopy;  Laterality: N/A;   FUDUCIAL PLACEMENT N/A 03/06/2023   Procedure: PLACEMENT OF FUDUCIAL;  Surgeon: Kerrin Elspeth BROCKS, MD;  Location: [facility] OR;  Service: Thoracic;  Laterality: N/A;   HYSTERECTOMY ABDOMINAL WITH SALPINGO-OOPHORECTOMY  1975   LYMPH NODE BIOPSY Right 1983  Neck   VIDEO BRONCHOSCOPY WITH ENDOBRONCHIAL NAVIGATION N/A 03/06/2023   Procedure: VIDEO BRONCHOSCOPY WITH ENDOBRONCHIAL NAVIGATION;  Surgeon: Kerrin Elspeth BROCKS, MD;  Location: [facility] OR;  Service: Thoracic;  Laterality: N/A;    Family History  Problem Relation Age of Onset   Diabetes Mother    Heart disease Mother    Cancer Mother        Uterine   Diabetes Father    CAD Father    Heart attack Father    Hypothyroidism Sister    Melanoma Sister    Clotting disorder Sister    Stomach cancer Sister    Diabetes Sister    Diabetes Sister    Hypertension Sister    Hypothyroidism  Sister    Diabetes Sister    Hypertension Sister    Hypothyroidism Sister    Cancer Sister    Cancer Sister    Diabetes Daughter    Diabetes Son    Diabetes Son    Breast cancer Half-Sister    Pancreatic cancer Nephew    Uterine cancer Niece    Ovarian cancer Other    Uterine cancer Other    Breast cancer Other    Colon cancer Neg Hx    Esophageal cancer Neg Hx     Social History   Socioeconomic History   Marital status: Widowed    Spouse name: Not on file   Number of children: 3   Years of education: Not on file   Highest education level: GED or equivalent  Occupational History   Occupation: Retired    Associate Professor: [employer]  Tobacco Use   Smoking status: Former    Current packs/day: 0.00    Average packs/day: 0.5 packs/day for 55.0 years (27.5 ttl pk-yrs)    Types: Cigarettes    Quit date: 07/01/2019    Years since quitting: 4.2   Smokeless tobacco: Never  Vaping Use   Vaping status: Never Used  Substance and Sexual Activity   Alcohol use: No   Drug use: No   Sexual activity: Yes    Birth control/protection: Surgical, Post-menopausal  Other Topics Concern   Not on file  Social History Narrative   Current Social History 09/12/2020      Patient lives with 2 sons and granddaughter in a one story home. There are 3 steps with handrail up to the entrance the patient uses.       Patient's method of transportation is personal car.      The highest level of education was GED      The patient currently retired from [employer].      Identified important Relationships are my children, my grandchildren, my great-grandchildren, and my sisters      Pets : dog and Brewing Technologist / Fun: read, watch movies, go walking, and just do things with the children.      Current Stressors: dog      Religious / Personal Beliefs: Old fashioned [employer].       Pt lives with her 2 sons and granddaughter-2025            Social Drivers of Health   Financial Resource  Strain: Medium Risk (01/29/2023)   Overall Financial Resource Strain (CARDIA)    Difficulty of Paying Living Expenses: Somewhat hard  Food Insecurity: No Food Insecurity (01/29/2023)   Hunger Vital Sign    Worried About Running Out of Food in the Last Year: Never true    Ran  Out of Food in the Last Year: Never true  Transportation Needs: No Transportation Needs (01/29/2023)   PRAPARE - Administrator, Civil Service (Medical): No    Lack of Transportation (Non-Medical): No  Physical Activity: Inactive (01/29/2023)   Exercise Vital Sign    Days of Exercise per Week: 0 days    Minutes of Exercise per Session: 0 min  Stress: No Stress Concern Present (01/29/2023)   [employer] of Occupational Health - Occupational Stress Questionnaire    Feeling of Stress : Not at all  Social Connections: Moderately Isolated (01/29/2023)   Social Connection and Isolation Panel    Frequency of Communication with Friends and Family: More than three times a week    Frequency of Social Gatherings with Friends and Family: Three times a week    Attends Religious Services: 1 to 4 times per year    Active Member of Clubs or Organizations: No    Attends Banker Meetings: Never    Marital Status: Widowed  Intimate Partner Violence: Patient Unable To Answer (01/29/2023)   Humiliation, Afraid, Rape, and Kick questionnaire    Fear of Current or Ex-Partner: Patient unable to answer    Emotionally Abused: Patient unable to answer    Physically Abused: Patient unable to answer    Sexually Abused: Patient unable to answer    Outpatient Medications Prior to Visit  Medication Sig Dispense Refill   Accu-Chek Softclix Lancets lancets 4 (four) times daily.     acetaminophen  (TYLENOL ) 500 MG tablet Take 500 mg by mouth every 6 (six) hours as needed for moderate pain.     albuterol  (VENTOLIN  HFA) 108 (90 Base) MCG/ACT inhaler INHALE 2 PUFFS BY MOUTH EVERY 6 HOURS AS NEEDED FOR WHEEZING OR SHORTNESS OF  BREATH 18 g 0   atorvastatin  (LIPITOR) 20 MG tablet Take 1 tablet (20 mg total) by mouth daily. 90 tablet 3   Blood Glucose Monitoring Suppl DEVI 1 each by Does not apply route in the morning, at noon, and at bedtime. May substitute to any manufacturer covered by patient's insurance. 1 each 0   Dietary Management Product (VASCULERA) TABS Take 630 mg by mouth daily. 30 tablet 2   diltiazem  (DILACOR XR ) 120 MG 24 hr capsule Take 1 capsule (120 mg total) by mouth daily. 90 capsule 3   doxycycline  (VIBRA -TABS) 100 MG tablet Take 1 tablet (100 mg total) by mouth 2 (two) times daily. 28 tablet 0   FARXIGA  10 MG TABS tablet Take 1 tablet by mouth once daily 90 tablet 0   gabapentin  (NEURONTIN ) 300 MG capsule Take 1 capsule (300 mg total) by mouth 2 (two) times daily. 60 capsule 1   Glucose Blood (BLOOD GLUCOSE TEST STRIPS) STRP 1 each by In Vitro route 2 (two) times daily. May substitute to any manufacturer covered by patient's insurance. 600 strip 0   lisinopril  (ZESTRIL ) 40 MG tablet Take 1 tablet by mouth once daily 90 tablet 0   SYMBICORT  80-4.5 MCG/ACT inhaler Inhale 2 puffs by mouth twice daily 11 g 1   Tiotropium Bromide Monohydrate  (SPIRIVA  RESPIMAT) 2.5 MCG/ACT AERS INHALE 2 SPRAY(S) BY MOUTH ONCE DAILY AT  8  PM. 4 g 2   Vitamin D , Cholecalciferol, 25 MCG (1000 UT) TABS Take 1 tablet by mouth once daily 90 tablet 0   XARELTO  20 MG TABS tablet TAKE 1 TABLET BY MOUTH ONCE DAILY WITH SUPPER 90 tablet 0   betamethasone  valerate ointment (VALISONE ) 0.1 % Apply 1  Application topically 2 (two) times daily. 30 g 0   No facility-administered medications prior to visit.    No Known Allergies  Review of Systems  Constitutional:  Negative for chills and fever.  Respiratory:  Negative for shortness of breath.        Wearing oxygen    Cardiovascular:  Negative for chest pain, palpitations and leg swelling.  Gastrointestinal:  Negative for abdominal pain, constipation, diarrhea, nausea and vomiting.   Genitourinary:  Negative for dysuria, frequency and urgency.  Musculoskeletal:  Positive for myalgias.       RLE pain   Neurological:  Negative for dizziness, focal weakness and headaches.       Objective:    Physical Exam Constitutional:      General: She is not in acute distress.    Appearance: She is not ill-appearing.  Eyes:     Extraocular Movements: Extraocular movements intact.     Conjunctiva/sclera: Conjunctivae normal.  Cardiovascular:     Rate and Rhythm: Normal rate.  Pulmonary:     Effort: Pulmonary effort is normal.  Musculoskeletal:     Cervical back: Normal range of motion and neck supple.     Right lower leg: Swelling and tenderness present.     Comments: Erythema, mild erythema, TTP of right shin near scar   Skin:    General: Skin is warm and dry.  Neurological:     General: No focal deficit present.     Mental Status: She is alert and oriented to person, place, and time.     Motor: No weakness.     Coordination: Coordination normal.  Psychiatric:        Mood and Affect: Mood normal.        Behavior: Behavior normal.        Thought Content: Thought content normal.      BP 130/70 (BP Location: Left Arm, Patient Position: Sitting)   Pulse (!) 55   Temp (!) 97.5 F (36.4 C)   Ht 5' 4 (1.626 m)   Wt 186 lb 6.4 oz (84.6 kg)   LMP 06/21/1973   SpO2 97%   BMI 32.00 kg/m  Wt Readings from Last 3 Encounters:  10/16/23 186 lb 6.4 oz (84.6 kg)  09/18/23 184 lb (83.5 kg)  08/21/23 183 lb (83 kg)       Assessment & Plan:   Problem List Items Addressed This Visit     Aortic atherosclerosis   Relevant Orders   Lipid panel (Completed)   Hypertension - Primary   Relevant Orders   CBC with Differential/Platelet (Completed)   Basic metabolic panel with GFR (Completed)   Type 2 diabetes mellitus with chronic kidney disease, without long-term current use of insulin (HCC)   Relevant Orders   CBC with Differential/Platelet (Completed)   Basic  metabolic panel with GFR (Completed)   Other Visit Diagnoses       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 Completed third round of doxycycline  with no significant improvement, suggesting non-infectious etiology. MRI  shows subcutaneous edema from proximal calf through ankle.  - Continue gabapentin  300 mg in the morning and at night. - Continue use of betamethasone  valerate ointment as needed for burning and itching. - Encourage use of warm washcloths for symptomatic relief. - Consider referral to general surgery for biopsy if recommended by ortho. - Send message to ortho to consider biopsy. - Encourage use of Elliptus machine for exercise. - Advise against wearing wool compression socks due to allergic reaction. -Continue current medication regimen including Vasculera   Type 2 diabetes mellitus with chronic kidney disease Type 2 diabetes with chronic kidney disease. Blood sugars are improving, but dietary indiscretions can cause significant spikes. Emphasized importance of maintaining blood sugar control to prevent complications. - Continue current diabetes management plan. - Reinforce dietary management to avoid high sugar foods. - Plan to check A1c in three months.  Allergic contact dermatitis of lower extremities (wool) Allergic contact dermatitis due to wool compression socks, resulting in blistering and irritation. Resolved after discontinuation of wool socks. - Avoid wool products to prevent recurrence of  dermatitis.  Hyperlipidemia Hyperlipidemia under management. - Order fasting cholesterol test. - Continue atorvastatin  20 mg daily.     I am having Kimberly Walters maintain her acetaminophen , atorvastatin , diltiazem , Vitamin D  (Cholecalciferol), albuterol , Blood Glucose Monitoring Suppl, BLOOD GLUCOSE TEST STRIPS, Spiriva  Respimat, Vasculera, Symbicort , gabapentin , Farxiga , lisinopril , doxycycline , Xarelto , Accu-Chek Softclix Lancets, and betamethasone  valerate ointment.  Meds ordered this encounter  Medications   betamethasone  valerate ointment (VALISONE ) 0.1 %    Sig: Apply 1 Application topically 2 (two) times daily.    Dispense:  30 g    Refill:  0    "

## 2023-10-17 ENCOUNTER — Ambulatory Visit: Payer: Self-pay | Admitting: Family Medicine

## 2023-10-17 NOTE — Progress Notes (Signed)
 Her labs are stable. No new concerns.

## 2023-10-22 ENCOUNTER — Other Ambulatory Visit: Payer: Self-pay | Admitting: Family Medicine

## 2023-10-22 DIAGNOSIS — J439 Emphysema, unspecified: Secondary | ICD-10-CM

## 2023-10-23 ENCOUNTER — Encounter: Payer: Self-pay | Admitting: Physician Assistant

## 2023-10-23 ENCOUNTER — Ambulatory Visit: Admitting: Physician Assistant

## 2023-10-23 DIAGNOSIS — L539 Erythematous condition, unspecified: Secondary | ICD-10-CM

## 2023-10-23 DIAGNOSIS — I872 Venous insufficiency (chronic) (peripheral): Secondary | ICD-10-CM | POA: Diagnosis not present

## 2023-10-23 NOTE — Progress Notes (Signed)
 Office Visit Note   Patient: Kimberly Walters           Date of Birth: 04-07-1950           MRN: [id number] Visit Date: 10/23/2023              Requested by: Lendia Boby LITTIE, NP-C [address] [place],  [place] [address] PCP: Lendia Boby LITTIE, NP-C  Chief Complaint  Patient presents with   Right Leg - Follow-up      HPI: 73 y/o female with history of right LE minimal erythema, mild edema since May of 2025.  She is a newly diagnosed diabetic, A1c 8.4.  She has normal ABI's with palpable pedal pulses.  No evidence of DVT or venous reflux.  She has been on 2 rounds of oral antibiotics which she stated helps some.  I recently placed her in Vive [place] wool compression socks.  She developed small blistering and had an allergic reaction.  She discontinued the wool compression socks.               She and her sister are fearful of limb loss because her father had to have his leg amputated.  She and her sister are concerned.  She states she has right anterior lower leg redness and swelling.    We discussed elevation and I asked how many times she elevated yesterday and she did not at all.  She has not gotten new compression 15-20 mm hg yet, because the Vive socks broke her out due to wool allergy.    Assessment & Plan: Visit Diagnoses:  1. Dependent rubor   2. Venous insufficiency (chronic) (peripheral)     Plan: Elevation at least 3 ties a day above the level of the heart.  Start a walking program and do it at least 3 times a day.  She may benefit from a recumbent bike once the weather turns cold for indoor exercise or a gym.  Compression 15-20 mm hg wear daily.  Put on first thing in the am and take off before bed.  Ankle exercise ABC's and walking may help with her cramps.    Follow-Up Instructions: Return if symptoms worsen or fail to improve.   Ortho Exam  Patient is alert, oriented, no adenopathy, well-dressed, normal affect, normal respiratory effort. Mild pitting edema in the  bilateral  LE.  Palpable DP/PT pulses B LE.  Motor intact of knee and ankle without pain.  Ambulation without pain.  No dorsal foot edema noted.  Minimal anterior blanching rubor.  Dependent rubor in the anterior shin that goes away with elevation.  I had her lay on the exam table and I elevated her leg to show her the proper elevation and the rubor dissipated.  She is not at risk of limb loss.  No leukocytosis on CBC 10/16/23, elevated glucose     Normal ABI's with Triphasic flow, negative venous reflux.  CBC    Component Value Date/Time   WBC 5.8 10/16/2023 1011   RBC 4.82 10/16/2023 1011   HGB 14.8 10/16/2023 1011   HGB 13.6 07/14/2023 1035   HGB 15.3 07/17/2021 1143   HCT 45.8 10/16/2023 1011   HCT 45.8 07/17/2021 1143   PLT 180.0 10/16/2023 1011   PLT 186 07/14/2023 1035   PLT 214 07/17/2021 1143   MCV 95.1 10/16/2023 1011   MCV 91 07/17/2021 1143   MCH 31.3 07/14/2023 1035   MCHC 32.4 10/16/2023 1011   RDW 14.5  10/16/2023 1011   RDW 13.1 07/17/2021 1143   LYMPHSABS 1.1 10/16/2023 1011   LYMPHSABS 1.4 07/17/2021 1143   MONOABS 0.7 10/16/2023 1011   EOSABS 0.2 10/16/2023 1011   EOSABS 0.3 07/17/2021 1143   BASOSABS 0.1 10/16/2023 1011   BASOSABS 0.1 07/17/2021 1143     Imaging: No results found. No images are attached to the encounter.  Labs: Lab Results  Component Value Date   HGBA1C 8.4 (H) 07/09/2023   HGBA1C 7.1 (H) 03/03/2023   HGBA1C 7.3 (H) 12/24/2022   ESRSEDRATE 9 08/21/2023   ESRSEDRATE 11 02/13/2023   ESRSEDRATE 2 07/17/2021   CRP <1.0 08/21/2023   CRP 9 07/17/2021   REPTSTATUS 08/04/2019 FINAL 07/30/2019   REPTSTATUS 08/04/2019 FINAL 07/30/2019   CULT  07/30/2019    NO GROWTH 5 DAYS Performed at [facility], [address], [place], [place] [address]    CULT  07/30/2019    NO GROWTH 5 DAYS Performed at [facility], [address] [place], [place] [address]      Lab Results  Component Value Date   ALBUMIN 4.1 07/14/2023    ALBUMIN 4.1 07/09/2023   ALBUMIN 4.3 03/31/2023    Lab Results  Component Value Date   MG 1.7 07/09/2023   MG 1.9 12/24/2022   MG 2.1 07/18/2020   Lab Results  Component Value Date   VD25OH 32.50 07/09/2023   VD25OH 31.64 12/24/2022   VD25OH 33.8 01/04/2022    No results found for: PREALBUMIN    Latest Ref Rng & Units 10/16/2023   10:11 AM 08/21/2023   10:34 AM 07/14/2023   10:35 AM  CBC EXTENDED  WBC 4.0 - 10.5 K/uL 5.8  4.8  8.0   RBC 3.87 - 5.11 Mil/uL 4.82  4.59  4.34   Hemoglobin 12.0 - 15.0 g/dL 85.1  85.8  86.3   HCT 36.0 - 46.0 % 45.8  43.1  40.5   Platelets 150.0 - 400.0 K/uL 180.0  179.0  186   NEUT# 1.4 - 7.7 K/uL 3.8  2.9  5.7   Lymph# 0.7 - 4.0 K/uL 1.1  1.1  1.3      There is no height or weight on file to calculate BMI.  Orders:  No orders of the defined types were placed in this encounter.  No orders of the defined types were placed in this encounter.    Procedures: No procedures performed  Clinical Data: No additional findings.  ROS:  All other systems negative, except as noted in the HPI. Review of Systems  Objective: Vital Signs: LMP 06/21/1973   Specialty Comments:  No specialty comments available.  PMFS History: Patient Active Problem List   Diagnosis Date Noted   Former smoker 07/09/2023   Pain and swelling of right lower leg 07/09/2023   Decreased pedal pulses 07/09/2023   Hypercoagulable state 07/01/2023   Leg swelling 07/01/2023   Cellulitis of right lower extremity 07/01/2023   Malignant neoplasm of lower lobe of left lung (HCC) 03/20/2023   Solitary pulmonary nodule on lung CT 02/12/2023   Exercise hypoxemia 02/12/2023   Nausea 01/17/2023   History of Helicobacter pylori infection 01/17/2023   Type 2 diabetes mellitus with chronic kidney disease, without long-term current use of insulin (HCC) 01/08/2023   DOE (dyspnea on exertion) 12/24/2022   Vitamin D  deficiency 12/24/2022   History of pulmonary embolism  12/24/2022   Smokes 12/24/2022   Right upper quadrant abdominal tenderness without rebound tenderness 12/24/2022  Right upper quadrant abdominal pain with positive Murphy Sign 12/24/2022   Palpitations 12/24/2022   Eye swelling 07/31/2022   Factor V Leiden 07/31/2022   Acute rhinosinusitis 03/29/2022   Osteoporosis 01/04/2022   Prediabetes 10/18/2021   Chronic petechia bilateral lower extremity 07/25/2021   Abdominal pain, epigastric 07/04/2020   Asymptomatic varicose veins of both lower extremities 02/18/2020   Bloating 10/15/2019   Aortic atherosclerosis 08/30/2019   Long term (current) use of anticoagulants 07/22/2019   Pulmonary embolism (HCC) 07/20/2019   Paroxysmal atrial fibrillation with RVR (HCC)    COPD GOLD 2 with ex desats 07/13/2019   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 11/17/2018   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    GERD (gastroesophageal reflux disease)    Gross hematuria    History of hiatal hernia    Hyperlipidemia    Hypertension    Mitral valve prolapse    Nodule of lower lobe of left lung    Paroxysmal atrial fibrillation with RVR (HCC)    Pleural effusion on left    Post-thrombotic syndrome of left lower extremity    Primary hypercoagulable state    Pulmonary emboli (HCC)    Pyelonephritis 2021   Upper respiratory infection, viral 04/02/2021    Family History  Problem Relation Age of Onset   Diabetes Mother    Heart disease  Mother    Cancer Mother        Uterine   Diabetes Father    CAD Father    Heart attack Father    Hypothyroidism Sister    Melanoma Sister    Clotting disorder Sister    Stomach cancer Sister    Diabetes Sister    Diabetes Sister    Hypertension Sister    Hypothyroidism Sister    Diabetes Sister    Hypertension Sister    Hypothyroidism Sister    Cancer Sister    Cancer Sister    Diabetes Daughter    Diabetes Son    Diabetes Son    Breast cancer Half-Sister    Pancreatic cancer Nephew    Uterine cancer Niece    Ovarian cancer Other    Uterine cancer Other    Breast cancer Other    Colon cancer Neg Hx    Esophageal cancer Neg Hx     Past Surgical History:  Procedure Laterality Date   BIOPSY  07/19/2020   Procedure: BIOPSY;  Surgeon: Shila Gustav GAILS, MD;  Location: [facility];  Service: Endoscopy;;   CARDIAC CATHETERIZATION     pt states she had one in 1989 and 1996 w/ Dr. Lavon   COLONOSCOPY     ESOPHAGOGASTRODUODENOSCOPY (EGD) WITH PROPOFOL  N/A 07/19/2020   Procedure: ESOPHAGOGASTRODUODENOSCOPY (EGD) WITH PROPOFOL ;  Surgeon: Shila Gustav GAILS, MD;  Location: [facility];  Service: Endoscopy;  Laterality: N/A;   FUDUCIAL PLACEMENT N/A 03/06/2023   Procedure: PLACEMENT OF FUDUCIAL;  Surgeon: Kerrin Elspeth BROCKS, MD;  Location: [facility] OR;  Service: Thoracic;  Laterality: N/A;   HYSTERECTOMY ABDOMINAL WITH SALPINGO-OOPHORECTOMY  1975   LYMPH NODE BIOPSY Right 1983   Neck   VIDEO BRONCHOSCOPY WITH ENDOBRONCHIAL NAVIGATION N/A 03/06/2023   Procedure: VIDEO BRONCHOSCOPY WITH ENDOBRONCHIAL NAVIGATION;  Surgeon: Kerrin Elspeth BROCKS, MD;  Location: [facility] OR;  Service: Thoracic;  Laterality: N/A;   Social History   Occupational History   Occupation: Retired    Associate Professor: [employer]  Tobacco Use   Smoking status: Former    Current packs/day: 0.00    Average packs/day: 0.5 packs/day for 55.0 years (27.5 ttl pk-yrs)    Types: Cigarettes    Quit date: 07/01/2019    Years since  quitting: 4.3   Smokeless tobacco: Never  Vaping Use   Vaping status: Never Used  Substance and Sexual Activity   Alcohol use: No   Drug use: No   Sexual activity: Yes    Birth control/protection: Surgical, Post-menopausal

## 2023-10-24 ENCOUNTER — Telehealth: Payer: Self-pay

## 2023-10-24 DIAGNOSIS — I872 Venous insufficiency (chronic) (peripheral): Secondary | ICD-10-CM

## 2023-10-24 MED ORDER — VASCULERA PO TABS: 630.0000 mg | ORAL_TABLET | Freq: Every day | ORAL | 0 refills | Status: DC

## 2023-10-24 NOTE — Telephone Encounter (Signed)
 Copied from CRM [id number]. Topic: Clinical - Medication Question >> Oct 24, 2023  9:50 AM Carlyon D wrote: Reason for CRM: Pt is calling in regards to some Medication questions she is having she would like some one to give her a call asap to discuss Vasculera 630 MG Tablets. Please reach out to pt

## 2023-10-24 NOTE — Telephone Encounter (Signed)
 Called back pt and she was upset as she saw orthopedic yesterday and they told her that she wasn't doing her part and needs to get more exercise and that all her drs are in agreement that she isn't doing her part until she wears compression socks and elevates her feet. Pt was upset as she says the provider was contradicting what she was told last time that she doesn't need to exercise and this time told her she does. Pt states she is going to do what is told of her but just was irritated at how she was spoken to at that visit. What she was calling for our office was to see if she is needing to stay on the vasculara medication and if so, she will need a refill. I informed pt Vickie wants her to be on it for 3-6 months and see how that helps so we will go ahead and send the refill into [facility] for her today. Pt verbalized understanding

## 2023-10-30 ENCOUNTER — Telehealth: Payer: Self-pay

## 2023-10-30 NOTE — Telephone Encounter (Signed)
 Copied from CRM [id number]. Topic: Clinical - Medical Advice >> Oct 30, 2023  1:30 PM [name] GRADE wrote: Reason for CRM: Patient would like to know if she is due for any vaccines, she got the Covid and Flu vaccine at her local pharmacy and was asked if she needed any additional vaccines.

## 2023-11-03 NOTE — Telephone Encounter (Signed)
 Called and notified pt of recommendations, pt states she has had RSV and 1st shingles vaccine. I will try to pull into records

## 2023-11-17 ENCOUNTER — Ambulatory Visit (INDEPENDENT_AMBULATORY_CARE_PROVIDER_SITE_OTHER): Admitting: Family Medicine

## 2023-11-17 ENCOUNTER — Encounter: Payer: Self-pay | Admitting: Family Medicine

## 2023-11-17 VITALS — BP 132/84 | HR 73 | Temp 97.9°F | Resp 20 | Ht 64.0 in | Wt 187.0 lb

## 2023-11-17 DIAGNOSIS — I872 Venous insufficiency (chronic) (peripheral): Secondary | ICD-10-CM

## 2023-11-17 DIAGNOSIS — J441 Chronic obstructive pulmonary disease with (acute) exacerbation: Secondary | ICD-10-CM | POA: Diagnosis not present

## 2023-11-17 LAB — POC COVID19 BINAXNOW: SARS Coronavirus 2 Ag: NEGATIVE

## 2023-11-17 MED ORDER — PREDNISONE 20 MG PO TABS: 40.0000 mg | ORAL_TABLET | Freq: Every day | ORAL | 0 refills | Status: AC

## 2023-11-17 MED ORDER — AMOXICILLIN-POT CLAVULANATE 875-125 MG PO TABS: 1.0000 | ORAL_TABLET | Freq: Two times a day (BID) | ORAL | 0 refills | Status: AC

## 2023-11-17 NOTE — Progress Notes (Signed)
 Assessment & Plan COPD exacerbation (HCC) - Education provided on COPD exacerbations. - Continue maintenance inhalers for COPD. - Advised use of albuterol  inhaler twice daily while sick or every 4-6 hours as needed if she develops shortness of breath.  Orders:   POC COVID-19 BinaxNow   predniSONE  (DELTASONE ) 20 MG tablet; Take 2 tablets (40 mg total) by mouth daily for 5 days.   amoxicillin -clavulanate (AUGMENTIN ) 875-125 MG tablet; Take 1 tablet by mouth 2 (two) times daily for 7 days.  Chronic venous insufficiency Improvement with leg elevation and exercise. Compression hose problematic due to sensitivity and fit issues. - Continue leg elevation and exercise regimen.  - D/C'd Vasculera due to cost and ineffectiveness per patient.     Follow up plan: Return if symptoms worsen or fail to improve.  Niki Rung, MSN, APRN, FNP-C  Subjective:  HPI: Kimberly Walters is a 73 y.o. female presenting on 11/17/2023 for Sore Throat (Hoarse, ST, nasal congestion, cough with yellow production, tired /Started on SAT PM /Some low fever yesterday AM only - granddaughter sick = viral and sinus infections (neg flu/covid))  Discussed the use of AI scribe software for clinical note transcription with the patient, who gave verbal consent to proceed.  She has been experiencing a sore throat, hoarseness, congestion, and a productive cough with yellow-tinted sputum that has a 'funny taste.' She reports that coughing up sputum is new and has not occurred since she completed radiation treatment in April; her current symptoms began more recently. She also had a low-grade fever yesterday and feels extremely tired, describing it as being hit by a 'Mack truck.'  Her granddaughter, who lives with her, was recently diagnosed with a sinus and throat infection and was prescribed amoxicillin . She initially attributed a runny nose on Friday to allergies from playing basketball outside. However, by Saturday night, she  developed hoarseness, a slight headache, and increased fatigue. She woke up with hoarseness and cough the following morning.  She has a history of COPD and uses Symbicort  and Spiriva  regularly, taking two puffs of Spiriva  every morning and two puffs of Symbicort  at night. She has not needed to use her albuterol  inhaler and has not used supplemental oxygen  at home recently. Since yesterday afternoon, she has been mostly resting due to fatigue.  She mentions a previous issue with her legs, for which she was prescribed medication three months ago but discontinued it due to cost and lack of perceived benefit. She manages her leg symptoms by propping them up and engaging in physical activity, such as walking and using an elliptical. She has tried various compression hose but experienced adverse reactions, including swelling and skin irritation.      ROS: Negative unless specifically indicated above in HPI.   Relevant past medical history reviewed and updated as indicated.   Allergies and medications reviewed and updated.   Current Outpatient Medications:    Accu-Chek Softclix Lancets lancets, 4 (four) times daily., Disp: , Rfl:    acetaminophen  (TYLENOL ) 500 MG tablet, Take 500 mg by mouth every 6 (six) hours as needed for moderate pain., Disp: , Rfl:    albuterol  (VENTOLIN  HFA) 108 (90 Base) MCG/ACT inhaler, INHALE 2 PUFFS BY MOUTH EVERY 6 HOURS AS NEEDED FOR WHEEZING OR SHORTNESS OF BREATH, Disp: 18 g, Rfl: 0   amoxicillin -clavulanate (AUGMENTIN ) 875-125 MG tablet, Take 1 tablet by mouth 2 (two) times daily for 7 days., Disp: 14 tablet, Rfl: 0   atorvastatin  (LIPITOR) 20 MG tablet, Take 1 tablet (  20 mg total) by mouth daily., Disp: 90 tablet, Rfl: 3   betamethasone  valerate ointment (VALISONE ) 0.1 %, Apply 1 Application topically 2 (two) times daily., Disp: 30 g, Rfl: 0   Blood Glucose Monitoring Suppl DEVI, 1 each by Does not apply route in the morning, at noon, and at bedtime. May substitute to  any manufacturer covered by patient's insurance., Disp: 1 each, Rfl: 0   diltiazem  (DILACOR XR ) 120 MG 24 hr capsule, Take 1 capsule (120 mg total) by mouth daily., Disp: 90 capsule, Rfl: 3   FARXIGA  10 MG TABS tablet, Take 1 tablet by mouth once daily, Disp: 90 tablet, Rfl: 0   Glucose Blood (BLOOD GLUCOSE TEST STRIPS) STRP, 1 each by In Vitro route 2 (two) times daily. May substitute to any manufacturer covered by patient's insurance., Disp: 600 strip, Rfl: 0   lisinopril  (ZESTRIL ) 40 MG tablet, Take 1 tablet by mouth once daily, Disp: 90 tablet, Rfl: 0   predniSONE  (DELTASONE ) 20 MG tablet, Take 2 tablets (40 mg total) by mouth daily for 5 days., Disp: 10 tablet, Rfl: 0   SYMBICORT  80-4.5 MCG/ACT inhaler, Inhale 2 puffs by mouth twice daily, Disp: 11 g, Rfl: 1   Tiotropium Bromide Monohydrate  (SPIRIVA  RESPIMAT) 2.5 MCG/ACT AERS, INHALE 2 SPRAY(S) BY MOUTH ONCE DAILY AT 8 PM, Disp: 4 g, Rfl: 2   Vitamin D , Cholecalciferol, 25 MCG (1000 UT) TABS, Take 1 tablet by mouth once daily, Disp: 90 tablet, Rfl: 0   XARELTO  20 MG TABS tablet, TAKE 1 TABLET BY MOUTH ONCE DAILY WITH SUPPER, Disp: 90 tablet, Rfl: 0   gabapentin  (NEURONTIN ) 300 MG capsule, Take 1 capsule (300 mg total) by mouth 2 (two) times daily., Disp: 60 capsule, Rfl: 1  No Known Allergies  Objective:   BP 132/84   Pulse 73   Temp 97.9 F (36.6 C)   Resp 20   Ht 5' 4 (1.626 m)   Wt 187 lb (84.8 kg)   LMP 06/21/1973   SpO2 94% Comment: on 3 liters  BMI 32.10 kg/m    Physical Exam Vitals reviewed.  Constitutional:      General: She is not in acute distress.    Appearance: Normal appearance. She is not ill-appearing, toxic-appearing or diaphoretic.  HENT:     Head: Normocephalic and atraumatic.     Right Ear: Tympanic membrane, ear canal and external ear normal. There is no impacted cerumen.     Left Ear: Tympanic membrane, ear canal and external ear normal. There is no impacted cerumen.     Nose: No congestion or  rhinorrhea.     Right Sinus: Frontal sinus tenderness present. No maxillary sinus tenderness.     Left Sinus: Frontal sinus tenderness present. No maxillary sinus tenderness.     Mouth/Throat:     Mouth: Mucous membranes are moist.     Pharynx: Oropharynx is clear. No oropharyngeal exudate or posterior oropharyngeal erythema.  Eyes:     General: No scleral icterus.       Right eye: No discharge.        Left eye: No discharge.     Conjunctiva/sclera: Conjunctivae normal.  Cardiovascular:     Rate and Rhythm: Normal rate and regular rhythm.     Heart sounds: Normal heart sounds. No murmur heard.    No friction rub. No gallop.  Pulmonary:     Effort: Pulmonary effort is normal. No respiratory distress.     Breath sounds: No stridor. Wheezing present. No rhonchi  or rales.  Musculoskeletal:        General: Normal range of motion.     Cervical back: Normal range of motion.  Lymphadenopathy:     Cervical: Cervical adenopathy present.  Skin:    General: Skin is warm and dry.     Capillary Refill: Capillary refill takes less than 2 seconds.  Neurological:     General: No focal deficit present.     Mental Status: She is alert and oriented to person, place, and time. Mental status is at baseline.  Psychiatric:        Mood and Affect: Mood normal.        Behavior: Behavior normal.        Thought Content: Thought content normal.        Judgment: Judgment normal.

## 2023-11-24 ENCOUNTER — Encounter (HOSPITAL_BASED_OUTPATIENT_CLINIC_OR_DEPARTMENT_OTHER): Payer: Self-pay

## 2023-11-24 ENCOUNTER — Emergency Department (HOSPITAL_BASED_OUTPATIENT_CLINIC_OR_DEPARTMENT_OTHER)
Admission: EM | Admit: 2023-11-24 | Discharge: 2023-11-24 | Disposition: A | Discharge status: Home / Self Care | Source: Ambulatory Visit | Attending: Emergency Medicine | Admitting: Emergency Medicine

## 2023-11-24 ENCOUNTER — Ambulatory Visit: Payer: Self-pay | Admitting: *Deleted

## 2023-11-24 ENCOUNTER — Emergency Department (HOSPITAL_BASED_OUTPATIENT_CLINIC_OR_DEPARTMENT_OTHER)

## 2023-11-24 ENCOUNTER — Other Ambulatory Visit: Payer: Self-pay

## 2023-11-24 DIAGNOSIS — M47812 Spondylosis without myelopathy or radiculopathy, cervical region: Secondary | ICD-10-CM | POA: Diagnosis not present

## 2023-11-24 DIAGNOSIS — Z85118 Personal history of other malignant neoplasm of bronchus and lung: Secondary | ICD-10-CM | POA: Insufficient documentation

## 2023-11-24 DIAGNOSIS — I6789 Other cerebrovascular disease: Secondary | ICD-10-CM | POA: Insufficient documentation

## 2023-11-24 DIAGNOSIS — Z86711 Personal history of pulmonary embolism: Secondary | ICD-10-CM | POA: Diagnosis not present

## 2023-11-24 DIAGNOSIS — R0789 Other chest pain: Secondary | ICD-10-CM | POA: Insufficient documentation

## 2023-11-24 DIAGNOSIS — K760 Fatty (change of) liver, not elsewhere classified: Secondary | ICD-10-CM | POA: Diagnosis not present

## 2023-11-24 DIAGNOSIS — C3432 Malignant neoplasm of lower lobe, left bronchus or lung: Secondary | ICD-10-CM | POA: Diagnosis not present

## 2023-11-24 DIAGNOSIS — M25551 Pain in right hip: Secondary | ICD-10-CM

## 2023-11-24 DIAGNOSIS — Z043 Encounter for examination and observation following other accident: Secondary | ICD-10-CM | POA: Diagnosis not present

## 2023-11-24 DIAGNOSIS — I700 Atherosclerosis of aorta: Secondary | ICD-10-CM | POA: Diagnosis not present

## 2023-11-24 DIAGNOSIS — Z7901 Long term (current) use of anticoagulants: Secondary | ICD-10-CM | POA: Insufficient documentation

## 2023-11-24 DIAGNOSIS — S7001XA Contusion of right hip, initial encounter: Secondary | ICD-10-CM | POA: Diagnosis not present

## 2023-11-24 DIAGNOSIS — S0990XA Unspecified injury of head, initial encounter: Secondary | ICD-10-CM | POA: Diagnosis present

## 2023-11-24 DIAGNOSIS — W07XXXA Fall from chair, initial encounter: Secondary | ICD-10-CM | POA: Diagnosis not present

## 2023-11-24 DIAGNOSIS — E042 Nontoxic multinodular goiter: Secondary | ICD-10-CM | POA: Diagnosis not present

## 2023-11-24 DIAGNOSIS — S3011XA Contusion of abdominal wall, initial encounter: Secondary | ICD-10-CM | POA: Insufficient documentation

## 2023-11-24 DIAGNOSIS — M4312 Spondylolisthesis, cervical region: Secondary | ICD-10-CM | POA: Diagnosis not present

## 2023-11-24 DIAGNOSIS — I2510 Atherosclerotic heart disease of native coronary artery without angina pectoris: Secondary | ICD-10-CM | POA: Insufficient documentation

## 2023-11-24 DIAGNOSIS — M5384 Other specified dorsopathies, thoracic region: Secondary | ICD-10-CM | POA: Insufficient documentation

## 2023-11-24 DIAGNOSIS — S3991XA Unspecified injury of abdomen, initial encounter: Secondary | ICD-10-CM | POA: Diagnosis present

## 2023-11-24 DIAGNOSIS — N281 Cyst of kidney, acquired: Secondary | ICD-10-CM | POA: Diagnosis not present

## 2023-11-24 DIAGNOSIS — W19XXXA Unspecified fall, initial encounter: Secondary | ICD-10-CM

## 2023-11-24 LAB — CBC WITH DIFFERENTIAL/PLATELET
Abs Immature Granulocytes: 0.16 K/uL — ABNORMAL HIGH (ref 0.00–0.07)
Basophils Absolute: 0.1 K/uL (ref 0.0–0.1)
Basophils Relative: 0 %
Eosinophils Absolute: 0.3 K/uL (ref 0.0–0.5)
Eosinophils Relative: 3 %
HCT: 43.9 % (ref 36.0–46.0)
Hemoglobin: 14.9 g/dL (ref 12.0–15.0)
Immature Granulocytes: 1 %
Lymphocytes Relative: 11 %
Lymphs Abs: 1.2 K/uL (ref 0.7–4.0)
MCH: 31.6 pg (ref 26.0–34.0)
MCHC: 33.9 g/dL (ref 30.0–36.0)
MCV: 93.2 fL (ref 80.0–100.0)
Monocytes Absolute: 0.9 K/uL (ref 0.1–1.0)
Monocytes Relative: 8 %
Neutro Abs: 8.7 K/uL — ABNORMAL HIGH (ref 1.7–7.7)
Neutrophils Relative %: 77 %
Platelets: 195 K/uL (ref 150–400)
RBC: 4.71 MIL/uL (ref 3.87–5.11)
RDW: 13.2 % (ref 11.5–15.5)
WBC: 11.4 K/uL — ABNORMAL HIGH (ref 4.0–10.5)
nRBC: 0 % (ref 0.0–0.2)

## 2023-11-24 LAB — BASIC METABOLIC PANEL WITH GFR
Anion gap: 11 (ref 5–15)
BUN: 23 mg/dL (ref 8–23)
CO2: 24 mmol/L (ref 22–32)
Calcium: 10.3 mg/dL (ref 8.9–10.3)
Chloride: 107 mmol/L (ref 98–111)
Creatinine, Ser: 0.87 mg/dL (ref 0.44–1.00)
GFR, Estimated: >60 mL/min (ref >60)
Glucose, Bld: 131 mg/dL — ABNORMAL HIGH (ref 70–99)
Potassium: 4.2 mmol/L (ref 3.5–5.1)
Sodium: 141 mmol/L (ref 135–145)

## 2023-11-24 MED ORDER — IOHEXOL 300 MG/ML  SOLN
100.0000 mL | Freq: Once | INTRAMUSCULAR | Status: AC | PRN
  Administered 2023-11-24: 100 mL via INTRAVENOUS

## 2023-11-24 NOTE — ED Provider Notes (Addendum)
 [place] EMERGENCY DEPARTMENT AT [facility] Provider Note   CSN: [id number] Arrival date & time: 11/24/23  1020     Patient presents with: Kimberly Walters is a 73 y.o. female.   Patient sent in by her regular provider since she is on Xarelto  for pulmonary embolus had a fall on Thursday fell out of a [place]-chair onto her right side.  Has bruising to her right hip and right side of her abdomen.  They were concerned about internal injury.  Patient does have a mild headache.  Did not directly hit her head or have any loss of consciousness.  Patient has been able to ambulate and move all extremities since the fall.       Prior to Admission medications   Medication Sig Start Date End Date Taking? Authorizing Provider  Accu-Chek Softclix Lancets lancets 4 (four) times daily. 07/11/23   [provider]  acetaminophen  (TYLENOL ) 500 MG tablet Take 500 mg by mouth every 6 (six) hours as needed for moderate pain.    [provider]  albuterol  (VENTOLIN  HFA) 108 (90 Base) MCG/ACT inhaler INHALE 2 PUFFS BY MOUTH EVERY 6 HOURS AS NEEDED FOR WHEEZING OR SHORTNESS OF BREATH 06/24/23   Henson, Vickie L, NP-C  amoxicillin -clavulanate (AUGMENTIN ) 875-125 MG tablet Take 1 tablet by mouth 2 (two) times daily for 7 days. 11/17/23 11/24/23  Merlynn Niki FALCON, FNP  atorvastatin  (LIPITOR) 20 MG tablet Take 1 tablet (20 mg total) by mouth daily. 01/01/23 11/17/23  O'NealDarryle Ned, MD  betamethasone  valerate ointment (VALISONE ) 0.1 % Apply 1 Application topically 2 (two) times daily. 10/16/23   Henson, Vickie L, NP-C  Blood Glucose Monitoring Suppl DEVI 1 each by Does not apply route in the morning, at noon, and at bedtime. May substitute to any manufacturer covered by patient's insurance. 07/10/23   Henson, Vickie L, NP-C  diltiazem  (DILACOR XR ) 120 MG 24 hr capsule Take 1 capsule (120 mg total) by mouth daily. 01/31/23   O'NealDarryle Ned, MD  FARXIGA  10 MG TABS tablet Take  1 tablet by mouth once daily 09/29/23   Henson, Vickie L, NP-C  gabapentin  (NEURONTIN ) 300 MG capsule Take 1 capsule (300 mg total) by mouth 2 (two) times daily. 09/18/23   Henson, Vickie L, NP-C  Glucose Blood (BLOOD GLUCOSE TEST STRIPS) STRP 1 each by In Vitro route 2 (two) times daily. May substitute to any manufacturer covered by patient's insurance. 07/10/23   Henson, Vickie L, NP-C  lisinopril  (ZESTRIL ) 40 MG tablet Take 1 tablet by mouth once daily 09/30/23   Henson, Vickie L, NP-C  SYMBICORT  80-4.5 MCG/ACT inhaler Inhale 2 puffs by mouth twice daily 09/10/23   Henson, Vickie L, NP-C  Tiotropium Bromide Monohydrate  (SPIRIVA  RESPIMAT) 2.5 MCG/ACT AERS INHALE 2 SPRAY(S) BY MOUTH ONCE DAILY AT 8 PM 10/22/23   Henson, Vickie L, NP-C  Vitamin D , Cholecalciferol, 25 MCG (1000 UT) TABS Take 1 tablet by mouth once daily 05/14/23   Henson, Vickie L, NP-C  XARELTO  20 MG TABS tablet TAKE 1 TABLET BY MOUTH ONCE DAILY WITH SUPPER 10/09/23   Henson, Vickie L, NP-C    Allergies: Patient has no known allergies.    Review of Systems  Constitutional:  Negative for chills and fever.  HENT:  Negative for ear pain and sore throat.   Eyes:  Negative for pain and visual disturbance.  Respiratory:  Negative for cough and shortness of breath.   Cardiovascular:  Negative for chest pain and  palpitations.  Gastrointestinal:  Positive for abdominal pain. Negative for diarrhea, nausea and vomiting.  Genitourinary:  Negative for dysuria and hematuria.  Musculoskeletal:  Negative for arthralgias, back pain, joint swelling and neck pain.  Skin:  Negative for color change and rash.  Neurological:  Negative for seizures and syncope.  All other systems reviewed and are negative.   Updated Vital Signs BP 113/74 (BP Location: Left Arm)   Pulse 80   Temp 98.1 F (36.7 C)   Resp 18   LMP 06/21/1973   SpO2 96%   Physical Exam Vitals and nursing note reviewed.  Constitutional:      General: She is not in acute distress.     Appearance: Normal appearance. She is well-developed.  HENT:     Head: Normocephalic and atraumatic.     Mouth/Throat:     Mouth: Mucous membranes are moist.  Eyes:     Extraocular Movements: Extraocular movements intact.     Conjunctiva/sclera: Conjunctivae normal.     Pupils: Pupils are equal, round, and reactive to light.  Cardiovascular:     Rate and Rhythm: Normal rate and regular rhythm.     Heart sounds: No murmur heard. Pulmonary:     Effort: Pulmonary effort is normal. No respiratory distress.     Breath sounds: Normal breath sounds.     Comments: Tenderness to palpation to right lateral chest wall. Chest:     Chest wall: Tenderness present.  Abdominal:     Palpations: Abdomen is soft.     Tenderness: There is abdominal tenderness.     Comments: Significant bruising kind of right flank right upper quadrant area.  With tenderness.  Musculoskeletal:        General: No swelling.     Cervical back: Normal range of motion and neck supple. No rigidity.     Right lower leg: No edema.     Left lower leg: No edema.     Comments: Bruising to the right hip.  No obvious deformity.  Neurovascularly intact distally no leg swelling  Skin:    General: Skin is warm and dry.     Capillary Refill: Capillary refill takes less than 2 seconds.  Neurological:     General: No focal deficit present.     Mental Status: She is alert and oriented to person, place, and time.     Cranial Nerves: No cranial nerve deficit.     Sensory: No sensory deficit.  Psychiatric:        Mood and Affect: Mood normal.     (all labs ordered are listed, but only abnormal results are displayed) Labs Reviewed  CBC WITH DIFFERENTIAL/PLATELET  BASIC METABOLIC PANEL WITH GFR    EKG: None  Radiology: No results found.   Procedures   Medications Ordered in the ED - No data to display                                  Medical Decision Making Amount and/or Complexity of Data Reviewed Labs:  ordered. Radiology: ordered.  Risk Prescription drug management.   The bruising may be just subcutaneous.  But certainly important to go ahead and CT trauma head neck chest abdomen and pelvis with basic labs and IV contrast.  CBC white count 11.4 hemoglobin reassuring at 14.9 platelets 195.  Basic metabolic panel is very normal.  Glucose 131 GFR is greater than 60.  CT head cervical spine  CT abdomen pelvis and chest is resulted.  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  Incidental findings of atherosclerotic and coronary atherosclerotic disease.  CT head neck still pending.  CT head and neck without any acute findings.  The CT neck did not crossover but you can read the report.  Bruising secondary to just being on the blood thinner but no significant acute injuries.  As I discussed with patient we can have not completely ruled out rib injury but certainly no underlying lung abnormality related to that.  Patient stable for discharge home.  Labs are very reassuring    Final diagnoses:  Fall, initial encounter    ED Discharge Orders     None          Geraldene Hamilton, MD 11/24/23 [id number]    Geraldene Hamilton, MD 11/24/23 1452    Geraldene Hamilton, MD 11/24/23 [phone]

## 2023-11-24 NOTE — ED Notes (Signed)
 Patient transported to CT

## 2023-11-24 NOTE — Discharge Instructions (Signed)
 CT head neck chest abdomen pelvis without any acute findings.  This is very reassuring.  Follow-up with your doctor.  Return for any new or worse symptoms

## 2023-11-24 NOTE — ED Triage Notes (Signed)
 Patient reports falling Thursday on her right side hurting her right hip and shoulder. Ecchymosis noted on right side, back, and abdomen. She is able to ambulate and move all her extremities. She denies head strike but is on Xerelto.

## 2023-11-24 NOTE — Telephone Encounter (Signed)
 FYI Only or Action Required?: FYI only for provider: ED advised.  Patient was last seen in primary care on 11/17/2023 by Kimberly Niki FALCON, FNP.  Called Nurse Triage reporting Fall.  Symptoms began several days ago.    Symptoms are: gradually worsening.  Triage Disposition: Go to ED Now (or PCP Triage)  Patient/caregiver understands and will follow disposition?: Yes  Copied from CRM [id number]. Topic: Clinical - Red Word Triage >> Nov 24, 2023  8:24 AM Rea BROCKS wrote: Red Word that prompted transfer to Nurse Triage: Patient fell Thursday morning, bruises, pain in right hip. Bruise that appeared on stomach from navel. Pain in right shoulder and down side. Family states that right side is swollen but patient cannot tell but it does hurt and she is sore from the fall. Reason for Disposition  Taking Coumadin  (warfarin) or other strong blood thinner, or known bleeding disorder (e.g., thrombocytopenia)  Answer Assessment - Initial Assessment Questions 1. MECHANISM: How did the injury happen?      Patient reports she leaned over to pick up paper- fell out of her chair. Fell on  R side 2. ONSET: When did the injury happen? (e.g., minutes, hours ago)     Thursday 3. LOCATION: What part of the abdomen is injured?     R side, hip 4. APPEARANCE of INJURY: What does the injury look like?     Large areas of bruising and swelling above the hip on R side 5. PAIN: Is there any pain? If Yes, ask: How bad is the pain?  (Scale 0-10; or none, mild, moderate, severe)     Pain is in side- at swelling and in shoulder, 3/10 6. SIZE: For cuts, bruises, or swelling, ask: How large is it? (e.g., inches or centimeters)     Patient has new bruising on abdomen- size of hand  8. OTHER SYMPTOMS: Do you have any other symptoms?     none  Protocols used: Abdominal Injury-A-AH

## 2023-11-26 ENCOUNTER — Telehealth: Payer: Self-pay

## 2023-11-26 NOTE — Transitions of Care (Post Inpatient/ED Visit) (Signed)
   11/26/2023  Name: Kimberly Walters MRN: [id number] DOB: 12-Jun-1950  Today's TOC FU Call Status: Today's TOC FU Call Status:: Successful TOC FU Call Completed  Attempted to reach the patient regarding the most recent Inpatient/ED visit.  Follow Up Plan: No further outreach attempts will be made at this time. We have been unable to contact the patient. Pt is already been schedule for follow up on 12/04/23  Signature Ronnald Palms

## 2023-12-04 ENCOUNTER — Encounter: Payer: Self-pay | Admitting: Family Medicine

## 2023-12-04 ENCOUNTER — Ambulatory Visit: Payer: Self-pay | Admitting: Family Medicine

## 2023-12-04 ENCOUNTER — Ambulatory Visit: Admitting: Family Medicine

## 2023-12-04 ENCOUNTER — Ambulatory Visit

## 2023-12-04 VITALS — BP 108/72 | HR 57 | Temp 97.8°F | Ht 64.0 in | Wt 187.0 lb

## 2023-12-04 DIAGNOSIS — W19XXXA Unspecified fall, initial encounter: Secondary | ICD-10-CM

## 2023-12-04 DIAGNOSIS — M25512 Pain in left shoulder: Secondary | ICD-10-CM

## 2023-12-04 DIAGNOSIS — M549 Dorsalgia, unspecified: Secondary | ICD-10-CM

## 2023-12-04 DIAGNOSIS — R0789 Other chest pain: Secondary | ICD-10-CM | POA: Diagnosis not present

## 2023-12-04 DIAGNOSIS — M47814 Spondylosis without myelopathy or radiculopathy, thoracic region: Secondary | ICD-10-CM | POA: Diagnosis not present

## 2023-12-04 DIAGNOSIS — M25612 Stiffness of left shoulder, not elsewhere classified: Secondary | ICD-10-CM | POA: Diagnosis not present

## 2023-12-04 DIAGNOSIS — M4854XA Collapsed vertebra, not elsewhere classified, thoracic region, initial encounter for fracture: Secondary | ICD-10-CM | POA: Diagnosis not present

## 2023-12-04 DIAGNOSIS — M8580 Other specified disorders of bone density and structure, unspecified site: Secondary | ICD-10-CM | POA: Diagnosis not present

## 2023-12-04 DIAGNOSIS — M778 Other enthesopathies, not elsewhere classified: Secondary | ICD-10-CM | POA: Diagnosis not present

## 2023-12-04 DIAGNOSIS — M19012 Primary osteoarthritis, left shoulder: Secondary | ICD-10-CM

## 2023-12-04 DIAGNOSIS — M85812 Other specified disorders of bone density and structure, left shoulder: Secondary | ICD-10-CM | POA: Diagnosis not present

## 2023-12-04 DIAGNOSIS — M8080XA Other osteoporosis with current pathological fracture, unspecified site, initial encounter for fracture: Secondary | ICD-10-CM

## 2023-12-04 LAB — CBC WITH DIFFERENTIAL/PLATELET
Basophils Absolute: 0.1 K/uL (ref 0.0–0.1)
Basophils Relative: 0.9 % (ref 0.0–3.0)
Eosinophils Absolute: 0.1 K/uL (ref 0.0–0.7)
Eosinophils Relative: 2.2 % (ref 0.0–5.0)
HCT: 43.7 % (ref 36.0–46.0)
Hemoglobin: 14.6 g/dL (ref 12.0–15.0)
Lymphocytes Relative: 16.5 % (ref 12.0–46.0)
Lymphs Abs: 1.1 K/uL (ref 0.7–4.0)
MCHC: 33.5 g/dL (ref 30.0–36.0)
MCV: 93.5 fl (ref 78.0–100.0)
Monocytes Absolute: 0.5 K/uL (ref 0.1–1.0)
Monocytes Relative: 7.8 % (ref 3.0–12.0)
Neutro Abs: 4.8 K/uL (ref 1.4–7.7)
Neutrophils Relative %: 72.6 % (ref 43.0–77.0)
Platelets: 196 K/uL (ref 150.0–400.0)
RBC: 4.67 Mil/uL (ref 3.87–5.11)
RDW: 14.3 % (ref 11.5–15.5)
WBC: 6.6 K/uL (ref 4.0–10.5)

## 2023-12-04 LAB — TROPONIN I (HIGH SENSITIVITY): High Sens Troponin I: 11 ng/L (ref 2–17)

## 2023-12-04 MED ORDER — METHOCARBAMOL 500 MG PO TABS: 500.0000 mg | ORAL_TABLET | Freq: Three times a day (TID) | ORAL | 1 refills | Status: AC | PRN

## 2023-12-04 NOTE — Progress Notes (Signed)
 She has an old compression fracture of T11 that is not new. This is not causing her pain.  She should consider getting on treatment for osteoporosis since she has this fracture. If she is willing, please place referral to osteoporosis clinic. See labs and other X ray before calling

## 2023-12-04 NOTE — Patient Instructions (Signed)
 Please go downstairs for x-rays and blood work  You can take over-the-counter Tylenol  if needed but do not take any other pain medication except Robaxin which is the muscle relaxant I am prescribing you.  Be aware this medication can be sedating.  Use a heating pad and topical over-the-counter pain meds such as Salonpas with lidocaine  or lidocaine  patches  I will be in touch with your results.  Let me know if you have any new or worsening symptoms.

## 2023-12-04 NOTE — Progress Notes (Signed)
 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 Who is her orthopedist? She needs to see one. Ok to refer to [facility] if she does not have one. Urgent referral.

## 2023-12-04 NOTE — Progress Notes (Signed)
 Her labs are fine. Please see note regarding X rays before calling

## 2023-12-04 NOTE — Progress Notes (Signed)
 Subjective:     Patient ID: Kimberly Walters, female    DOB: May 17, 1950, 73 y.o.   MRN: [id number]  Chief Complaint  Patient presents with   Hospitalization Follow-up    Clemens on right side, bruising gone but having left shoulder pain    HPI  Discussed the use of AI scribe software for clinical note transcription with the patient, who gave verbal consent to proceed.  History of Present Illness Kimberly Walters is a 73 year old female who presents for follow-up after a recent fall.  Fall-related injuries - Clemens on right side two weeks ago while cleaning under desk chair on hardwood floors - Evaluated in the ED on 11/24/2023 - Subsequent development of large bruise on abdomen  Left shoulder pain - Persistent pain under left scapula described as sore, pinching sensation - Pain worsens with movement and radiates from back to front - Exacerbated by activities such as holding a hair dryer - No chest pain, palpitations, or skipped beats - Uses Tylenol  every six hours for pain relief   Relevant medical history seen on CT  - Emphysema - Aortic atherosclerosis - Coronary artery atherosclerosis - Treated left lower lobe lung cancer     Health Maintenance Due  Topic Date Due   FOOT EXAM  Never done   Mammogram  05/23/2023   Medicare Annual Wellness (AWV)  01/29/2024    Past Medical History:  Diagnosis Date   Anticoagulant-induced bleeding    Aortic atherosclerosis    Bereavement 11/16/2018   Cardiac arrest (HCC) 1989   pt states her heart stopped and they had to shock her heart and restarted it (w/ Dr. 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    GERD (gastroesophageal reflux disease)    Gross hematuria    History of hiatal hernia    Hyperlipidemia    Hypertension    Mitral valve prolapse    Nodule of lower lobe of left lung    Paroxysmal  atrial fibrillation with RVR (HCC)    Pleural effusion on left    Post-thrombotic syndrome of left lower extremity    Primary hypercoagulable state    Pulmonary emboli (HCC)    Pyelonephritis 2021   Upper respiratory infection, viral 04/02/2021    Past Surgical History:  Procedure Laterality Date   BIOPSY  07/19/2020   Procedure: BIOPSY;  Surgeon: Shila Gustav GAILS, MD;  Location: [facility];  Service: Endoscopy;;   CARDIAC CATHETERIZATION     pt states she had one in 1989 and 1996 w/ Dr. Lavon   COLONOSCOPY     ESOPHAGOGASTRODUODENOSCOPY (EGD) WITH PROPOFOL  N/A 07/19/2020   Procedure: ESOPHAGOGASTRODUODENOSCOPY (EGD) WITH PROPOFOL ;  Surgeon: Shila Gustav GAILS, MD;  Location: [facility];  Service: Endoscopy;  Laterality: N/A;   FUDUCIAL PLACEMENT N/A 03/06/2023   Procedure: PLACEMENT OF FUDUCIAL;  Surgeon: Kerrin Elspeth BROCKS, MD;  Location: [facility] OR;  Service: Thoracic;  Laterality: N/A;   HYSTERECTOMY ABDOMINAL WITH SALPINGO-OOPHORECTOMY  1975   LYMPH NODE BIOPSY Right 1983   Neck   VIDEO BRONCHOSCOPY WITH ENDOBRONCHIAL NAVIGATION N/A 03/06/2023   Procedure: VIDEO BRONCHOSCOPY WITH ENDOBRONCHIAL NAVIGATION;  Surgeon: Kerrin Elspeth BROCKS, MD;  Location: [facility] OR;  Service: Thoracic;  Laterality: N/A;    Family History  Problem Relation Age of Onset   Diabetes Mother    Heart disease Mother  Cancer Mother        Uterine   Diabetes Father    CAD Father    Heart attack Father    Hypothyroidism Sister    Melanoma Sister    Clotting disorder Sister    Stomach cancer Sister    Diabetes Sister    Diabetes Sister    Hypertension Sister    Hypothyroidism Sister    Diabetes Sister    Hypertension Sister    Hypothyroidism Sister    Cancer Sister    Cancer Sister    Diabetes Daughter    Diabetes Son    Diabetes Son    Breast cancer Half-Sister    Pancreatic cancer Nephew    Uterine cancer Niece    Ovarian cancer Other    Uterine cancer Other    Breast cancer Other     Colon cancer Neg Hx    Esophageal cancer Neg Hx     Social History   Socioeconomic History   Marital status: Widowed    Spouse name: Not on file   Number of children: 3   Years of education: Not on file   Highest education level: GED or equivalent  Occupational History   Occupation: Retired    Associate Professor: [employer]  Tobacco Use   Smoking status: Former    Current packs/day: 0.00    Average packs/day: 0.5 packs/day for 55.0 years (27.5 ttl pk-yrs)    Types: Cigarettes    Quit date: 07/01/2019    Years since quitting: 4.4   Smokeless tobacco: Never  Vaping Use   Vaping status: Never Used  Substance and Sexual Activity   Alcohol use: No   Drug use: No   Sexual activity: Yes    Birth control/protection: Surgical, Post-menopausal  Other Topics Concern   Not on file  Social History Narrative   Current Social History 09/12/2020      Patient lives with 2 sons and granddaughter in a one story home. There are 3 steps with handrail up to the entrance the patient uses.       Patient's method of transportation is personal car.      The highest level of education was GED      The patient currently retired from [employer].      Identified important Relationships are my children, my grandchildren, my great-grandchildren, and my sisters      Pets : dog and Brewing Technologist / Fun: read, watch movies, go walking, and just do things with the children.      Current Stressors: dog      Religious / Personal Beliefs: Old fashioned [employer].       Pt lives with her 2 sons and granddaughter-2025            Social Drivers of Health   Financial Resource Strain: Medium Risk (01/29/2023)   Overall Financial Resource Strain (CARDIA)    Difficulty of Paying Living Expenses: Somewhat hard  Food Insecurity: No Food Insecurity (01/29/2023)   Hunger Vital Sign    Worried About Running Out of Food in the Last Year: Never true    Ran Out of Food in the Last Year: Never true   Transportation Needs: No Transportation Needs (01/29/2023)   PRAPARE - Administrator, Civil Service (Medical): No    Lack of Transportation (Non-Medical): No  Physical Activity: Inactive (01/29/2023)   Exercise Vital Sign    Days of Exercise per Week: 0 days  Minutes of Exercise per Session: 0 min  Stress: No Stress Concern Present (01/29/2023)   [employer] of Occupational Health - Occupational Stress Questionnaire    Feeling of Stress : Not at all  Social Connections: Moderately Isolated (01/29/2023)   Social Connection and Isolation Panel    Frequency of Communication with Friends and Family: More than three times a week    Frequency of Social Gatherings with Friends and Family: Three times a week    Attends Religious Services: 1 to 4 times per year    Active Member of Clubs or Organizations: No    Attends Banker Meetings: Never    Marital Status: Widowed  Intimate Partner Violence: Patient Unable To Answer (01/29/2023)   Humiliation, Afraid, Rape, and Kick questionnaire    Fear of Current or Ex-Partner: Patient unable to answer    Emotionally Abused: Patient unable to answer    Physically Abused: Patient unable to answer    Sexually Abused: Patient unable to answer    Outpatient Medications Prior to Visit  Medication Sig Dispense Refill   Accu-Chek Softclix Lancets lancets 4 (four) times daily.     acetaminophen  (TYLENOL ) 500 MG tablet Take 500 mg by mouth every 6 (six) hours as needed for moderate pain.     albuterol  (VENTOLIN  HFA) 108 (90 Base) MCG/ACT inhaler INHALE 2 PUFFS BY MOUTH EVERY 6 HOURS AS NEEDED FOR WHEEZING OR SHORTNESS OF BREATH 18 g 0   atorvastatin  (LIPITOR) 20 MG tablet Take 1 tablet (20 mg total) by mouth daily. 90 tablet 3   betamethasone  valerate ointment (VALISONE ) 0.1 % Apply 1 Application topically 2 (two) times daily. 30 g 0   Blood Glucose Monitoring Suppl DEVI 1 each by Does not apply route in the morning, at noon, and at  bedtime. May substitute to any manufacturer covered by patient's insurance. 1 each 0   diltiazem  (DILACOR XR ) 120 MG 24 hr capsule Take 1 capsule (120 mg total) by mouth daily. 90 capsule 3   FARXIGA  10 MG TABS tablet Take 1 tablet by mouth once daily 90 tablet 0   gabapentin  (NEURONTIN ) 300 MG capsule Take 1 capsule (300 mg total) by mouth 2 (two) times daily. 60 capsule 1   Glucose Blood (BLOOD GLUCOSE TEST STRIPS) STRP 1 each by In Vitro route 2 (two) times daily. May substitute to any manufacturer covered by patient's insurance. 600 strip 0   lisinopril  (ZESTRIL ) 40 MG tablet Take 1 tablet by mouth once daily 90 tablet 0   SYMBICORT  80-4.5 MCG/ACT inhaler Inhale 2 puffs by mouth twice daily 11 g 1   Tiotropium Bromide Monohydrate  (SPIRIVA  RESPIMAT) 2.5 MCG/ACT AERS INHALE 2 SPRAY(S) BY MOUTH ONCE DAILY AT 8 PM 4 g 2   Vitamin D , Cholecalciferol, 25 MCG (1000 UT) TABS Take 1 tablet by mouth once daily 90 tablet 0   XARELTO  20 MG TABS tablet TAKE 1 TABLET BY MOUTH ONCE DAILY WITH SUPPER 90 tablet 0   No facility-administered medications prior to visit.    No Known Allergies  Review of Systems  Constitutional:  Negative for chills and fever.  Eyes:  Negative for blurred vision and double vision.  Respiratory:  Negative for cough and shortness of breath.        Baseline respiratory   Cardiovascular:  Negative for chest pain, palpitations and leg swelling.  Gastrointestinal:  Negative for abdominal pain, constipation, diarrhea, nausea and vomiting.  Genitourinary:  Negative for dysuria, frequency and urgency.  Musculoskeletal:  Positive for back pain, falls and joint pain. Negative for neck pain.  Neurological:  Negative for dizziness, focal weakness and headaches.  Psychiatric/Behavioral:  Negative for memory loss.        Objective:    Physical Exam Constitutional:      General: She is not in acute distress.    Appearance: She is not ill-appearing.  HENT:     Head: Atraumatic.      Nose: Nose normal.     Mouth/Throat:     Mouth: Mucous membranes are moist.     Pharynx: Oropharynx is clear.  Eyes:     Extraocular Movements: Extraocular movements intact.     Conjunctiva/sclera: Conjunctivae normal.     Pupils: Pupils are equal, round, and reactive to light.  Cardiovascular:     Rate and Rhythm: Normal rate and regular rhythm.  Pulmonary:     Effort: Pulmonary effort is normal.     Breath sounds: Normal breath sounds.  Chest:     Chest wall: Tenderness present.       Comments: Chest wall TTP without crepitus  Musculoskeletal:     Left shoulder: Decreased range of motion. Normal pulse.     Cervical back: Normal range of motion and neck supple. No tenderness. No pain with movement. Normal range of motion.     Thoracic back: Spasms and tenderness present. Decreased range of motion.     Lumbar back: Normal.       Back:     Right lower leg: No edema.     Left lower leg: No edema.     Comments: Spasm noted left thoracic spine with severe TTP of thoracic region on left  Difficulty with certain movements of left shoulder due to pain Limited abduction ability   Lymphadenopathy:     Cervical: No cervical adenopathy.  Skin:    General: Skin is warm and dry.  Neurological:     General: No focal deficit present.     Mental Status: She is alert and oriented to person, place, and time.     Cranial Nerves: No cranial nerve deficit.     Sensory: No sensory deficit.     Motor: No weakness.     Coordination: Coordination normal.  Psychiatric:        Mood and Affect: Mood normal.        Behavior: Behavior normal.        Thought Content: Thought content normal.      BP 108/72   Pulse (!) 57   Temp 97.8 F (36.6 C) (Temporal)   Ht 5' 4 (1.626 m)   Wt 187 lb (84.8 kg)   LMP 06/21/1973   SpO2 95% Comment: on oxygen   BMI 32.10 kg/m  Wt Readings from Last 3 Encounters:  12/04/23 187 lb (84.8 kg)  11/17/23 187 lb (84.8 kg)  10/16/23 186 lb 6.4 oz (84.6 kg)        Assessment & Plan:   Problem List Items Addressed This Visit   None Visit Diagnoses       Acute pain of left shoulder    -  Primary   Relevant Orders   Troponin I (High Sensitivity)   DG Shoulder Left     Chest wall tenderness       Relevant Orders   Troponin I (High Sensitivity)   DG Thoracic Spine W/Swimmers     Fall, initial encounter       Relevant Orders   DG Shoulder Left  DG Thoracic Spine W/Swimmers     Impaired range of motion of left shoulder       Relevant Orders   DG Shoulder Left     Upper back pain on left side       Relevant Medications   methocarbamol (ROBAXIN) 500 MG tablet   Other Relevant Orders   Troponin I (High Sensitivity)   CBC with Differential/Platelet   DG Thoracic Spine W/Swimmers       Assessment and Plan Assessment & Plan Left shoulder pain with muscle spasm Left shoulder pain with muscle spasm, likely due to muscle strain or spasm following a fall. Pain is exacerbated by movement and is described as a pinching sensation. No chest pain reported. Differential includes muscle spasm and possible rib fracture. - Prescribed Robaxin for muscle relaxation. - Advised use of a heating pad on the affected area. - Recommended application of a lidocaine  patch or Salonpas with lidocaine  for topical pain relief. - Ordered stat left shoulder x-ray to rule out joint issues. - Advised against driving, operating machinery, or consuming alcohol while on muscle relaxants. - Will consider referral to sports medicine if pain persists.  Fall Occurred two weeks ago, resulting in left shoulder pain and chest wall tenderness. 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 No acute  neurological deficits or stroke symptoms reported.  - Denies any memory issues  - Continue to monitor neurological status and cognitive function.  Left chest wall tenderness Likely related to muscle spasm or strain from the fall. - Continue to monitor for changes in pain or new symptoms.     I am having Kimberly Walters start on methocarbamol. I am also having her maintain her acetaminophen , atorvastatin , diltiazem , Vitamin D  (Cholecalciferol), albuterol , Blood Glucose Monitoring Suppl, BLOOD GLUCOSE TEST STRIPS, Symbicort , gabapentin , Farxiga , lisinopril , Xarelto , Accu-Chek Softclix Lancets, betamethasone  valerate ointment, and Spiriva  Respimat.  Meds ordered this encounter  Medications   methocarbamol (ROBAXIN) 500 MG tablet    Sig: Take 1 tablet (500 mg total) by mouth every 8 (eight) hours as needed for muscle spasms.    Dispense:  30 tablet    Refill:  1    Supervising Provider:   ROLLENE NORRIS A [[id number]]

## 2023-12-07 ENCOUNTER — Other Ambulatory Visit: Payer: Self-pay | Admitting: Family Medicine

## 2023-12-16 ENCOUNTER — Ambulatory Visit: Admitting: Sports Medicine

## 2023-12-16 ENCOUNTER — Other Ambulatory Visit: Payer: Self-pay

## 2023-12-16 ENCOUNTER — Encounter: Payer: Self-pay | Admitting: Sports Medicine

## 2023-12-16 DIAGNOSIS — E1122 Type 2 diabetes mellitus with diabetic chronic kidney disease: Secondary | ICD-10-CM

## 2023-12-16 DIAGNOSIS — G8929 Other chronic pain: Secondary | ICD-10-CM | POA: Diagnosis not present

## 2023-12-16 DIAGNOSIS — M25512 Pain in left shoulder: Secondary | ICD-10-CM | POA: Diagnosis not present

## 2023-12-16 DIAGNOSIS — M19012 Primary osteoarthritis, left shoulder: Secondary | ICD-10-CM | POA: Diagnosis not present

## 2023-12-16 MED ORDER — METHYLPREDNISOLONE ACETATE 40 MG/ML IJ SUSP
40.0000 mg | INTRAMUSCULAR | Status: AC | PRN
  Administered 2023-12-16: 40 mg via INTRA_ARTICULAR

## 2023-12-16 MED ORDER — BUPIVACAINE HCL 0.25 % IJ SOLN
2.0000 mL | INTRAMUSCULAR | Status: AC | PRN
  Administered 2023-12-16: 2 mL via INTRA_ARTICULAR

## 2023-12-16 MED ORDER — LIDOCAINE HCL 1 % IJ SOLN
2.0000 mL | INTRAMUSCULAR | Status: AC | PRN
  Administered 2023-12-16: 2 mL

## 2023-12-16 NOTE — Progress Notes (Signed)
 Patient says that she fell on her right side on October 30th, although she has had left shoulder pain longer than that. She had x-rays recently when she saw her PCP, who mentioned that her shoulder is bone on bone and suggested she see an orthopedic doctor for a possible injection. She does have pain that goes up the left side of her neck, and down her triceps, although that pain does not go beyond the elbow towards her hand. She is limited in ROM due to pain when compared to the unaffected right side. She does not have popping or clicking in the shoulder, but does have popping in her neck. She is taking a muscle relaxer and Tylenol , and does find that heat and hot showers help.

## 2023-12-16 NOTE — Progress Notes (Signed)
 Kimberly Walters - 73 y.o. female MRN [id number]  Date of birth: [birth year]  Office Visit Note: Visit Date: 12/16/2023 PCP: [name], NP-C Referred by: [name], NP-C  Subjective: Chief Complaint  Patient presents with   Left Shoulder - Pain   HPI: Kimberly Walters is a pleasant 74 y.o. female who presents today for acute on chronic left shoulder pain.  Patient states that she has had left shoulder pain for quite some time now.  However, she did fall on her right side on October 30th which worsened her pain, although she has had left shoulder pain longer than that. She had x-rays recently when she saw her PCP, who mentioned that her shoulder is bone on bone and suggested she see an orthopedic doctor for a possible injection. She does have pain that goes up the left side of her neck, and down her triceps, although that pain does not go beyond the elbow towards her hand. She is limited in ROM due to pain when compared to the unaffected right side. She does not have popping or clicking in the shoulder, but does have popping in her neck. She is taking a muscle relaxer (Robaxin ) and Tylenol , and does find that heat and hot showers help.  Recently stopped taking Tylenol  as the bottle send avoid other medications with this.  She is inquiring about that today.  She is unable to take NSAIDs as she is managed on Xarelto  20 mg daily.  She is a type-II diabetic.  Most recent A1c not at goal.  She is managed on Farxiga  10 mg daily.  Lab Results  Component Value Date   HGBA1C 8.4 (H) 07/09/2023   Pertinent ROS were reviewed with the patient and found to be negative unless otherwise specified above in HPI.   Assessment & Plan: Visit Diagnoses:  1. Primary osteoarthritis, left shoulder   2. Chronic left shoulder pain   3. Type 2 diabetes mellitus with chronic kidney disease, without long-term current use of insulin, unspecified CKD stage (HCC)    Plan: Impression is acute exacerbation  of chronic left shoulder pain which has x-ray evidence of severe osteoarthritis.  She fell on the contralateral side but this likely exacerbated her underlying OA.  She has limitation in range of motion and associated pain.  We discussed all treatment options, through shared decision making to proceed with ultrasound-guided left intra-articular shoulder injection.  Patient tolerated well, discussed postinjection protocol.  She is a type II diabetic, but non-insulin controlled.  Discussed transient glucose rise in coming days given CSI, will monitor as needed.  She will continue her Farxiga  10 mg daily and follow-up with PCP.  We discussed maximizing her range of motion and function of the shoulder in terms of physical therapy.  She prefers home therapy versus formal PT. I did print out a customized handout and did review these exercises with her in the room today. Will plan to start these after 72 hours from injection and perform at least once (up to 2x) daily.  She will continue her Robaxin  500 mg every 8 hours as needed for muscle pain, as well as Tylenol .  I will see her back in 1 month to reevaluate how she responds to the injection and her range of motion with her home therapy.  Follow-up: Return in about 1 month (around 01/15/2024) for L-shoulder f/u.   Meds & Orders: No orders of the defined types were placed in this encounter.   Orders Placed  This Encounter  Procedures   Large Joint Inj   US  Guided Needle Placement - No Linked Charges     Procedures: Large Joint Inj: L glenohumeral on 12/16/2023 1:55 PM Indications: pain Details: 22 G 3.5 in needle, ultrasound-guided posterior approach Medications: 2 mL lidocaine  1 %; 2 mL bupivacaine  0.25 %; 40 mg methylPREDNISolone  acetate 40 MG/ML Outcome: tolerated well, no immediate complications  US -guided glenohumeral joint injection, left shoulder After discussion on risks/benefits/indications, informed verbal consent was obtained. A timeout was  then performed. The patient was positioned lying lateral recumbent on examination table. The patient's shoulder was prepped with betadine and multiple alcohol swabs and utilizing ultrasound guidance, the patient's glenohumeral joint was identified on ultrasound. Using ultrasound guidance a 22-gauge, 3.5 inch needle with a mixture of 2:2:1 cc's lidocaine :bupivicaine:depomedrol was directed from a lateral to medial direction via in-plane technique into the glenohumeral joint with visualization of appropriate spread of injectate into the joint. Patient tolerated the procedure well without immediate complications.      Procedure, treatment alternatives, risks and benefits explained, specific risks discussed. Consent was given by the patient. Immediately prior to procedure a time out was called to verify the correct patient, procedure, equipment, support staff and site/side marked as required. Patient was prepped and draped in the usual sterile fashion.          Clinical History: No specialty comments available.  She reports that she quit smoking about 4 years ago. Her smoking use included cigarettes. She has a 27.5 pack-year smoking history. She has never used smokeless tobacco.  Recent Labs    12/24/22 1209 03/03/23 1145 07/09/23 0857  HGBA1C 7.3* 7.1* 8.4*    Objective:   Vital Signs: LMP 06/21/1973   Physical Exam  Gen: Well-appearing, in no acute distress; non-toxic CV: Well-perfused. Warm.  Resp: Breathing unlabored on room air; no wheezing. Psych: Fluid speech in conversation; appropriate affect; normal thought process  Ortho Exam - Left shoulder: No significant redness swelling or effusion.  There is limitation in both active and passive range of motion in all directions.  Forward flexion to approximately 115 degrees, abduction 95 degrees.  There is only about 15 to 20 degrees of external rotation.  No significant weakness with rotator cuff testing with the elbow at the side.   There is crepitus noted through end range of motion about the shoulder.  Imaging:  *3 views of the left shoulder from 12/04/2023 were independently reviewed and interpreted by myself today.  AP, scapular Y and axial view were visualized.  There is severe, bone-on-bone arthritic change of the glenohumeral joint with notable bony sclerosis as well as a large inferior Osteophyte off the humeral head.  There is asymmetric humeral head, likely in the nature of degenerative arthritic change.  No acute fracture noted.  Narrative & Impression  CLINICAL DATA:  Fall and left shoulder pain. Limited range of motion.   EXAM: LEFT SHOULDER - 2+ VIEW   COMPARISON:  None Available.   FINDINGS: No acute fracture or dislocation. The bones are osteopenic. There is arthritic changes of the left shoulder with severe narrowing of the glenohumeral joint space and bone-on-bone contact. There is spurring of the inferior humeral head. The soft tissues are unremarkable.   IMPRESSION: 1. No acute fracture or dislocation. 2. Severe arthritic changes of the left shoulder.     Electronically Signed   By: Vanetta Chou M.D.   On: 12/04/2023 14:36    Past Medical/Family/Surgical/Social History: Medications & Allergies reviewed per  EMR, new medications updated. Patient Active Problem List   Diagnosis Date Noted   Former smoker 07/09/2023   Pain and swelling of right lower leg 07/09/2023   Decreased pedal pulses 07/09/2023   Hypercoagulable state 07/01/2023   Leg swelling 07/01/2023   Cellulitis of right lower extremity 07/01/2023   Malignant neoplasm of lower lobe of left lung (HCC) 03/20/2023   Solitary pulmonary nodule on lung CT 02/12/2023   Exercise hypoxemia 02/12/2023   Nausea 01/17/2023   History of Helicobacter pylori infection 01/17/2023   Type 2 diabetes mellitus with chronic kidney disease, without long-term current use of insulin (HCC) 01/08/2023   DOE (dyspnea on exertion) 12/24/2022    Vitamin D  deficiency 12/24/2022   History of pulmonary embolism 12/24/2022   Smokes 12/24/2022   Right upper quadrant abdominal tenderness without rebound tenderness 12/24/2022   Right upper quadrant abdominal pain with positive Murphy Sign 12/24/2022   Palpitations 12/24/2022   Eye swelling 07/31/2022   Factor V Leiden 07/31/2022   Acute rhinosinusitis 03/29/2022   Osteoporosis 01/04/2022   Prediabetes 10/18/2021   Chronic petechia bilateral lower extremity 07/25/2021   Abdominal pain, epigastric 07/04/2020   Asymptomatic varicose veins of both lower extremities 02/18/2020   Bloating 10/15/2019   Aortic atherosclerosis 08/30/2019   Long term (current) use of anticoagulants 07/22/2019   Pulmonary embolism (HCC) 07/20/2019   Paroxysmal atrial fibrillation with RVR (HCC)    COPD GOLD 2 with ex desats 07/13/2019   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 11/17/2018   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    GERD (gastroesophageal reflux disease)    Gross hematuria    History of hiatal hernia    Hyperlipidemia    Hypertension    Mitral valve prolapse    Nodule of lower lobe of left lung    Paroxysmal atrial fibrillation with RVR (HCC)    Pleural effusion on left    Post-thrombotic syndrome of left lower extremity    Primary hypercoagulable state    Pulmonary emboli (HCC)    Pyelonephritis 2021   Upper respiratory infection, viral 04/02/2021   Family History   Problem Relation Age of Onset   Diabetes Mother    Heart disease Mother    Cancer Mother        Uterine   Diabetes Father    CAD Father    Heart attack Father    Hypothyroidism Sister    Melanoma Sister    Clotting disorder Sister    Stomach cancer Sister    Diabetes Sister    Diabetes Sister    Hypertension Sister    Hypothyroidism Sister    Diabetes Sister    Hypertension Sister    Hypothyroidism Sister    Cancer Sister    Cancer Sister    Diabetes Daughter    Diabetes Son    Diabetes Son    Breast cancer Half-Sister    Pancreatic cancer Nephew    Uterine  cancer Niece    Ovarian cancer Other    Uterine cancer Other    Breast cancer Other    Colon cancer Neg Hx    Esophageal cancer Neg Hx    Past Surgical History:  Procedure Laterality Date   BIOPSY  07/19/2020   Procedure: BIOPSY;  Surgeon: Shila Gustav GAILS, MD;  Location: [facility];  Service: Endoscopy;;   CARDIAC CATHETERIZATION     pt states she had one in 1989 and 1996 w/ Dr. Lavon   COLONOSCOPY     ESOPHAGOGASTRODUODENOSCOPY (EGD) WITH PROPOFOL  N/A 07/19/2020   Procedure: ESOPHAGOGASTRODUODENOSCOPY (EGD) WITH PROPOFOL ;  Surgeon: Shila Gustav GAILS, MD;  Location: [facility];  Service: Endoscopy;  Laterality: N/A;   FUDUCIAL PLACEMENT N/A 03/06/2023   Procedure: PLACEMENT OF FUDUCIAL;  Surgeon: Kerrin Elspeth BROCKS, MD;  Location: [facility] OR;  Service: Thoracic;  Laterality: N/A;   HYSTERECTOMY ABDOMINAL WITH SALPINGO-OOPHORECTOMY  1975   LYMPH NODE BIOPSY Right 1983   Neck   VIDEO BRONCHOSCOPY WITH ENDOBRONCHIAL NAVIGATION N/A 03/06/2023   Procedure: VIDEO BRONCHOSCOPY WITH ENDOBRONCHIAL NAVIGATION;  Surgeon: Kerrin Elspeth BROCKS, MD;  Location: [facility] OR;  Service: Thoracic;  Laterality: N/A;   Social History   Occupational History   Occupation: Retired    Associate Professor: [employer]  Tobacco Use   Smoking status: Former    Current packs/day: 0.00    Average packs/day: 0.5 packs/day for 55.0 years (27.5 ttl  pk-yrs)    Types: Cigarettes    Quit date: 07/01/2019    Years since quitting: 4.4   Smokeless tobacco: Never  Vaping Use   Vaping status: Never Used  Substance and Sexual Activity   Alcohol use: No   Drug use: No   Sexual activity: Yes    Birth control/protection: Surgical, Post-menopausal   Patient was instructed in 10 minutes of therapeutic exercises for right shoulder to improve strength, ROM and function according to my instructions and plan of care  during the office visit. A customized handout was provided and demonstration of proper technique shown and discussed. Patient did perform exercises and demonstrate understanding through teachback.  All questions discussed and answered.  Lonell Sprang, DO Primary Care Sports Medicine Physician  [facility] - Orthopedics  This note was dictated using Dragon naturally speaking software and may contain errors in syntax, spelling, or content which have not been identified prior to signing this note.

## 2023-12-22 ENCOUNTER — Ambulatory Visit: Admitting: Physician Assistant

## 2023-12-22 ENCOUNTER — Encounter: Payer: Self-pay | Admitting: Physician Assistant

## 2023-12-22 VITALS — Ht 64.0 in | Wt 187.0 lb

## 2023-12-22 DIAGNOSIS — M8000XA Age-related osteoporosis with current pathological fracture, unspecified site, initial encounter for fracture: Secondary | ICD-10-CM

## 2023-12-22 MED ORDER — DENOSUMAB-BBDZ 60 MG/ML ~~LOC~~ SOSY: 60.0000 mg | PREFILLED_SYRINGE | Freq: Once | SUBCUTANEOUS | Status: AC

## 2023-12-22 NOTE — Progress Notes (Signed)
 Office Visit Note   Patient: Kimberly Walters           Date of Birth: 06-Jul-1950           MRN: [id number] Visit Date: 12/22/2023              Requested by: Lendia Boby LITTIE, NP-C [address] [place],  [place] [address] PCP: Lendia Boby LITTIE, NP-C   Assessment & Plan: Visit Diagnoses:  1. Age-related osteoporosis with current pathological fracture, initial encounter     Plan: Patient is a pleasant 73 year old woman who is referred by Orie Lendia.  She has a history of multiple fragility fractures including a spontaneous lumbar compression fracture in 2022.  She has also had wrist fractures and rib fractures as well.  Her last bone density scan was in 2020 which demonstrated osteopenia at -2.4.  She has never taken medication for osteoporosis in the past.  She has a history of blood clotting disease.  She is does have a history of lung cancer.  No history of kidney disease or ulcers.  No history of gastric bypass she does not have severe reflux.  She does not have a history of epilepsy or seizures.  She did undergo a complete hysterectomy when she was 73 years old and did not have any hormone replacement therapy.  Source me this contributes to her osteoporosis as well as a history of smoking though she never smoked anymore.  She takes calcium  and vitamin D  just not sure how much.  She does enjoy walking does not take alcoholic beverages.  She has had no major dental work as she has dentures.  Not sure of her familial history.  I spent 45 minutes reviewing her chart answering questions and going over side effects of medication specifically Prolia  or Jubbonti .  I think this would be most appropriate for her.  We reviewed the side risks effects both minor and major.  I gave her information to review.  We will go forward with authorization she understands this may not be done until the new year she is fine with that in the meantime she is going to track her calcium  intake and make adjustments.  Also  will continue a maintenance dose of vitamin D   Follow-Up Instructions: Return if symptoms worsen or fail to improve.   Orders:  No orders of the defined types were placed in this encounter.  Meds ordered this encounter  Medications   denosumab -bbdz (JUBBONTI ) injection 60 mg    Patient is enrolled in [facility] program for this medication and I have provided a copy of the denosumab  Medication Guide and Patient Brochure.:   Yes    I have reviewed with the patient the information in the denosumab  Medication Guide and Patient Counseling Chart including the serious risks and symptoms of each risk.:   Yes    I have advised the patient to seek medical attention if they have signs or symptoms of any of the serious risks.:   Yes      Procedures: No procedures performed   Clinical Data: No additional findings.   Subjective: Chief Complaint  Patient presents with   Osteoporosis    HPI patient is a pleasant 73 year old woman accompanied by her sister referred by Dr. Lendia for evaluation of osteoporosis  Review of Systems  All other systems reviewed and are negative.    Objective: Vital Signs: Ht 5' 4 (1.626 m)   Wt 187 lb (84.8 kg)   LMP 06/21/1973  BMI 32.10 kg/m   Physical Exam Constitutional:      Appearance: Normal appearance.  Pulmonary:     Effort: Pulmonary effort is normal.  Skin:    General: Skin is warm and dry.  Neurological:     General: No focal deficit present.     Mental Status: She is alert and oriented to person, place, and time.  Psychiatric:        Mood and Affect: Mood normal.        Behavior: Behavior normal.       Specialty Comments:  No specialty comments available.  Imaging: No results found.   PMFS History: Patient Active Problem List   Diagnosis Date Noted   Former smoker 07/09/2023   Pain and swelling of right lower leg 07/09/2023   Decreased pedal pulses 07/09/2023   Hypercoagulable state 07/01/2023   Leg swelling 07/01/2023    Cellulitis of right lower extremity 07/01/2023   Malignant neoplasm of lower lobe of left lung (HCC) 03/20/2023   Solitary pulmonary nodule on lung CT 02/12/2023   Exercise hypoxemia 02/12/2023   Nausea 01/17/2023   History of Helicobacter pylori infection 01/17/2023   Type 2 diabetes mellitus with chronic kidney disease, without long-term current use of insulin (HCC) 01/08/2023   DOE (dyspnea on exertion) 12/24/2022   Vitamin D  deficiency 12/24/2022   History of pulmonary embolism 12/24/2022   Smokes 12/24/2022   Right upper quadrant abdominal tenderness without rebound tenderness 12/24/2022   Right upper quadrant abdominal pain with positive Murphy Sign 12/24/2022   Palpitations 12/24/2022   Eye swelling 07/31/2022   Factor V Leiden 07/31/2022   Acute rhinosinusitis 03/29/2022   Age-related osteoporosis without current pathological fracture 01/04/2022   Prediabetes 10/18/2021   Chronic petechia bilateral lower extremity 07/25/2021   Abdominal pain, epigastric 07/04/2020   Asymptomatic varicose veins of both lower extremities 02/18/2020   Bloating 10/15/2019   Aortic atherosclerosis 08/30/2019   Long term (current) use of anticoagulants 07/22/2019   Pulmonary embolism (HCC) 07/20/2019   Paroxysmal atrial fibrillation with RVR (HCC)    COPD GOLD 2 with ex desats 07/13/2019   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 11/17/2018   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     GERD (gastroesophageal reflux disease)    Gross hematuria    History of hiatal hernia    Hyperlipidemia    Hypertension    Mitral valve prolapse    Nodule of lower lobe of left lung    Paroxysmal atrial fibrillation with RVR (HCC)    Pleural effusion on left    Post-thrombotic syndrome of left lower extremity    Primary hypercoagulable state    Pulmonary emboli (HCC)    Pyelonephritis 2021   Upper respiratory infection, viral 04/02/2021    Family History  Problem Relation Age of Onset   Diabetes Mother    Heart disease Mother    Cancer Mother        Uterine  Diabetes Father    CAD Father    Heart attack Father    Hypothyroidism Sister    Melanoma Sister    Clotting disorder Sister    Stomach cancer Sister    Diabetes Sister    Diabetes Sister    Hypertension Sister    Hypothyroidism Sister    Diabetes Sister    Hypertension Sister    Hypothyroidism Sister    Cancer Sister    Cancer Sister    Diabetes Daughter    Diabetes Son    Diabetes Son    Breast cancer Half-Sister    Pancreatic cancer Nephew    Uterine cancer Niece    Ovarian cancer Other    Uterine cancer Other    Breast cancer Other    Colon cancer Neg Hx    Esophageal cancer Neg Hx     Past Surgical History:  Procedure Laterality Date   BIOPSY  07/19/2020   Procedure: BIOPSY;  Surgeon: Shila Gustav GAILS, MD;  Location: [facility];  Service: Endoscopy;;   CARDIAC CATHETERIZATION     pt states she had one in 1989 and 1996 w/ Dr. Lavon   COLONOSCOPY     ESOPHAGOGASTRODUODENOSCOPY (EGD) WITH PROPOFOL  N/A 07/19/2020   Procedure: ESOPHAGOGASTRODUODENOSCOPY (EGD) WITH PROPOFOL ;  Surgeon: Shila Gustav GAILS, MD;  Location: [facility];  Service: Endoscopy;  Laterality: N/A;   FUDUCIAL PLACEMENT N/A 03/06/2023   Procedure: PLACEMENT OF FUDUCIAL;  Surgeon: Kerrin Elspeth BROCKS, MD;  Location: [facility] OR;  Service: Thoracic;  Laterality: N/A;   HYSTERECTOMY ABDOMINAL WITH SALPINGO-OOPHORECTOMY  1975    LYMPH NODE BIOPSY Right 1983   Neck   VIDEO BRONCHOSCOPY WITH ENDOBRONCHIAL NAVIGATION N/A 03/06/2023   Procedure: VIDEO BRONCHOSCOPY WITH ENDOBRONCHIAL NAVIGATION;  Surgeon: Kerrin Elspeth BROCKS, MD;  Location: [facility] OR;  Service: Thoracic;  Laterality: N/A;   Social History   Occupational History   Occupation: Retired    Associate Professor: [employer]  Tobacco Use   Smoking status: Former    Current packs/day: 0.00    Average packs/day: 0.5 packs/day for 55.0 years (27.5 ttl pk-yrs)    Types: Cigarettes    Quit date: 07/01/2019    Years since quitting: 4.4   Smokeless tobacco: Never  Vaping Use   Vaping status: Never Used  Substance and Sexual Activity   Alcohol use: No   Drug use: No   Sexual activity: Yes    Birth control/protection: Surgical, Post-menopausal

## 2023-12-22 NOTE — Addendum Note (Signed)
 Addended by: Glennda Weatherholtz L on: 12/22/2023 03:00 PM   Modules accepted: Orders

## 2023-12-29 ENCOUNTER — Inpatient Hospital Stay: Attending: Internal Medicine

## 2023-12-29 ENCOUNTER — Ambulatory Visit (HOSPITAL_COMMUNITY): Admission: RE | Admit: 2023-12-29 | Discharge: 2023-12-29 | Attending: Physician Assistant

## 2023-12-29 DIAGNOSIS — C3492 Malignant neoplasm of unspecified part of left bronchus or lung: Secondary | ICD-10-CM

## 2023-12-29 DIAGNOSIS — Z08 Encounter for follow-up examination after completed treatment for malignant neoplasm: Secondary | ICD-10-CM | POA: Diagnosis present

## 2023-12-29 DIAGNOSIS — Z85118 Personal history of other malignant neoplasm of bronchus and lung: Secondary | ICD-10-CM | POA: Insufficient documentation

## 2023-12-29 LAB — CMP (CANCER CENTER ONLY)
ALT: 30 U/L (ref 0–44)
AST: 24 U/L (ref 15–41)
Albumin: 4.3 g/dL (ref 3.5–5.0)
Alkaline Phosphatase: 86 U/L (ref 38–126)
Anion gap: 13 (ref 5–15)
BUN: 16 mg/dL (ref 8–23)
CO2: 22 mmol/L (ref 22–32)
Calcium: 9.4 mg/dL (ref 8.9–10.3)
Chloride: 106 mmol/L (ref 98–111)
Creatinine: 1.07 mg/dL — ABNORMAL HIGH (ref 0.44–1.00)
GFR, Estimated: 54 mL/min — ABNORMAL LOW (ref >60)
Glucose, Bld: 183 mg/dL — ABNORMAL HIGH (ref 70–99)
Potassium: 4 mmol/L (ref 3.5–5.1)
Sodium: 141 mmol/L (ref 135–145)
Total Bilirubin: 0.6 mg/dL (ref 0.0–1.2)
Total Protein: 6.8 g/dL (ref 6.5–8.1)

## 2023-12-29 LAB — CBC WITH DIFFERENTIAL (CANCER CENTER ONLY)
Abs Immature Granulocytes: 0.06 K/uL (ref 0.00–0.07)
Basophils Absolute: 0.1 K/uL (ref 0.0–0.1)
Basophils Relative: 1 %
Eosinophils Absolute: 0.2 K/uL (ref 0.0–0.5)
Eosinophils Relative: 2 %
HCT: 43.2 % (ref 36.0–46.0)
Hemoglobin: 14.5 g/dL (ref 12.0–15.0)
Immature Granulocytes: 1 %
Lymphocytes Relative: 20 %
Lymphs Abs: 1.6 K/uL (ref 0.7–4.0)
MCH: 31.3 pg (ref 26.0–34.0)
MCHC: 33.6 g/dL (ref 30.0–36.0)
MCV: 93.3 fL (ref 80.0–100.0)
Monocytes Absolute: 0.8 K/uL (ref 0.1–1.0)
Monocytes Relative: 11 %
Neutro Abs: 5 K/uL (ref 1.7–7.7)
Neutrophils Relative %: 65 %
Platelet Count: 178 K/uL (ref 150–400)
RBC: 4.63 MIL/uL (ref 3.87–5.11)
RDW: 13.4 % (ref 11.5–15.5)
WBC Count: 7.8 K/uL (ref 4.0–10.5)
nRBC: 0 % (ref 0.0–0.2)

## 2023-12-29 MED ORDER — SODIUM CHLORIDE (PF) 0.9 % IJ SOLN
INTRAMUSCULAR | Status: AC
  Filled 2023-12-29: qty 50

## 2023-12-29 MED ORDER — IOHEXOL 300 MG/ML  SOLN
60.0000 mL | Freq: Once | INTRAMUSCULAR | Status: AC | PRN
  Administered 2023-12-29: 60 mL via INTRAVENOUS

## 2024-01-03 ENCOUNTER — Other Ambulatory Visit: Payer: Self-pay | Admitting: Family Medicine

## 2024-01-03 ENCOUNTER — Other Ambulatory Visit: Payer: Self-pay | Admitting: Cardiovascular Disease

## 2024-01-03 DIAGNOSIS — I10 Essential (primary) hypertension: Secondary | ICD-10-CM

## 2024-01-05 ENCOUNTER — Ambulatory Visit: Admitting: Internal Medicine

## 2024-01-05 VITALS — BP 139/71 | HR 60 | Temp 97.6°F | Resp 17 | Ht 64.0 in | Wt 187.0 lb

## 2024-01-05 DIAGNOSIS — C3490 Malignant neoplasm of unspecified part of unspecified bronchus or lung: Secondary | ICD-10-CM

## 2024-01-05 DIAGNOSIS — Z08 Encounter for follow-up examination after completed treatment for malignant neoplasm: Secondary | ICD-10-CM | POA: Diagnosis not present

## 2024-01-05 NOTE — Progress Notes (Signed)
 [facility] [phone]   [phone]  OFFICE PROGRESS NOTE  Kimberly Boby CROME, NP-C [address] [place] [address]  DIAGNOSIS: suspicious stage IA (T1b, N0, M0 lung cancer highly suspicious for non-small cell presented with left lower lobe lung nodule   PRIOR THERAPY: Status post curative radiotherapy to the left lower lobe lung nodule completed on April 18, 2023.  CURRENT THERAPY: Observation  INTERVAL HISTORY: Kimberly Walters 73 y.o. female returns to the clinic today for follow-up visit accompanied by her sister. Discussed the use of AI scribe software for clinical note transcription with the patient, who gave verbal consent to proceed.  History of Present Illness Kimberly Walters is a 73 year old female with stage 1A non-small cell lung cancer, status post curative radiotherapy, who presents for routine surveillance and restaging.  She completed curative radiotherapy for a suspicious left upper lobe lung nodule and is currently in the surveillance phase. She is asymptomatic from a pulmonary standpoint and reports no new symptoms since her last visit. She denies cough, dyspnea, and chest pain.  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 She received a shoulder injection for pain, which was notably uncomfortable. Her primary care provider has ordered a new bone density assay and injection, as her last bone density test was in 2021.    MEDICAL HISTORY: Past Medical History:  Diagnosis Date   Anticoagulant-induced bleeding    Aortic atherosclerosis    Bereavement 11/16/2018   Cardiac arrest (HCC) 1989   pt states her heart stopped and they had to shock her heart and restarted it (w/ Dr. Lavon)   Clotting disorder    Diabetes mellitus without complication (HCC)    Emphysema, unspecified  (HCC)    Factor V Leiden mutation    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    GERD (gastroesophageal reflux disease)    Gross hematuria    History of hiatal hernia    Hyperlipidemia    Hypertension    Mitral valve prolapse    Nodule of lower lobe of left lung    Paroxysmal atrial fibrillation with RVR (HCC)    Pleural effusion on left    Post-thrombotic syndrome of left lower extremity    Primary hypercoagulable state    Pulmonary emboli (HCC)    Pyelonephritis 2021   Upper respiratory infection, viral 04/02/2021    ALLERGIES:  has no known allergies.  MEDICATIONS:  Current Outpatient Medications  Medication Sig Dispense Refill   Accu-Chek Softclix Lancets lancets 4 (four) times daily.     acetaminophen  (TYLENOL ) 500 MG tablet Take 500 mg by mouth every 6 (six) hours as needed for moderate pain.     albuterol  (VENTOLIN  HFA) 108 (90 Base) MCG/ACT inhaler INHALE 2 PUFFS BY MOUTH EVERY 6 HOURS AS NEEDED FOR WHEEZING OR SHORTNESS OF BREATH 18 g 0   atorvastatin  (LIPITOR) 20 MG tablet Take 1 tablet (20 mg total) by mouth daily. 90 tablet 3   betamethasone  valerate ointment (VALISONE ) 0.1 % Apply 1 Application topically 2 (two) times daily. 30 g 0   Blood Glucose Monitoring Suppl DEVI 1 each by Does not apply route in the morning, at noon, and at bedtime. May substitute to any manufacturer covered by patient's insurance. 1 each 0   diltiazem  (DILACOR XR ) 120 MG 24 hr capsule Take 1 capsule (120 mg  total) by mouth daily. 90 capsule 3   FARXIGA  10 MG TABS tablet Take 1 tablet by mouth once daily 90 tablet 0   gabapentin  (NEURONTIN ) 300 MG capsule Take 1 capsule (300 mg total) by mouth 2 (two) times daily. 60 capsule 1   Glucose Blood (BLOOD GLUCOSE TEST STRIPS) STRP 1 each by In Vitro route 2 (two) times daily. May substitute to any manufacturer covered by patient's insurance. 600 strip 0   lisinopril  (ZESTRIL ) 40 MG tablet Take 1 tablet by mouth once daily 90 tablet 0   methocarbamol  (ROBAXIN )  500 MG tablet Take 1 tablet (500 mg total) by mouth every 8 (eight) hours as needed for muscle spasms. 30 tablet 1   SYMBICORT  80-4.5 MCG/ACT inhaler Inhale 2 puffs by mouth twice daily 11 g 0   Tiotropium Bromide Monohydrate  (SPIRIVA  RESPIMAT) 2.5 MCG/ACT AERS INHALE 2 SPRAY(S) BY MOUTH ONCE DAILY AT 8 PM 4 g 2   Vitamin D , Cholecalciferol, 25 MCG (1000 UT) TABS Take 1 tablet by mouth once daily 90 tablet 0   XARELTO  20 MG TABS tablet TAKE 1 TABLET BY MOUTH ONCE DAILY WITH SUPPER 90 tablet 0   Current Facility-Administered Medications  Medication Dose Route Frequency Provider Last Rate Last Admin   denosumab -bbdz (JUBBONTI ) injection 60 mg  60 mg Subcutaneous Once         SURGICAL HISTORY:  Past Surgical History:  Procedure Laterality Date   BIOPSY  07/19/2020   Procedure: BIOPSY;  Surgeon: Shila Gustav GAILS, MD;  Location: [facility];  Service: Endoscopy;;   CARDIAC CATHETERIZATION     pt states she had one in 1989 and 1996 w/ Dr. Lavon   COLONOSCOPY     ESOPHAGOGASTRODUODENOSCOPY (EGD) WITH PROPOFOL  N/A 07/19/2020   Procedure: ESOPHAGOGASTRODUODENOSCOPY (EGD) WITH PROPOFOL ;  Surgeon: Shila Gustav GAILS, MD;  Location: [facility];  Service: Endoscopy;  Laterality: N/A;   FUDUCIAL PLACEMENT N/A 03/06/2023   Procedure: PLACEMENT OF FUDUCIAL;  Surgeon: Kerrin Elspeth BROCKS, MD;  Location: [facility] OR;  Service: Thoracic;  Laterality: N/A;   HYSTERECTOMY ABDOMINAL WITH SALPINGO-OOPHORECTOMY  1975   LYMPH NODE BIOPSY Right 1983   Neck   VIDEO BRONCHOSCOPY WITH ENDOBRONCHIAL NAVIGATION N/A 03/06/2023   Procedure: VIDEO BRONCHOSCOPY WITH ENDOBRONCHIAL NAVIGATION;  Surgeon: Kerrin Elspeth BROCKS, MD;  Location: [facility] OR;  Service: Thoracic;  Laterality: N/A;    REVIEW OF SYSTEMS:  A comprehensive review of systems was negative except for: Respiratory: positive for dyspnea on exertion Musculoskeletal: positive for arthralgias   PHYSICAL EXAMINATION: General appearance: alert, cooperative,  fatigued, and no distress Head: Normocephalic, without obvious abnormality, atraumatic Neck: no adenopathy, no JVD, supple, symmetrical, trachea midline, and thyroid  not enlarged, symmetric, no tenderness/mass/nodules Lymph nodes: Cervical, supraclavicular, and axillary nodes normal. Resp: clear to auscultation bilaterally Back: symmetric, no curvature. ROM normal. No CVA tenderness. Cardio: regular rate and rhythm, S1, S2 normal, no murmur, click, rub or gallop GI: soft, non-tender; bowel sounds normal; no masses,  no organomegaly Extremities: extremities normal, atraumatic, no cyanosis or edema  ECOG PERFORMANCE STATUS: 1 - Symptomatic but completely ambulatory  Blood pressure 139/71, pulse 60, temperature 97.6 F (36.4 C), temperature source Temporal, resp. rate 17, height 5' 4 (1.626 m), weight 187 lb (84.8 kg), last menstrual period 06/21/1973, SpO2 96%.  LABORATORY DATA: Lab Results  Component Value Date   WBC 7.8 12/29/2023   HGB 14.5 12/29/2023   HCT 43.2 12/29/2023   MCV 93.3 12/29/2023   PLT 178 12/29/2023  Chemistry      Component Value Date/Time   NA 141 12/29/2023 0928   NA 143 08/02/2021 1518   K 4.0 12/29/2023 0928   CL 106 12/29/2023 0928   CO2 22 12/29/2023 0928   BUN 16 12/29/2023 0928   BUN 19 08/02/2021 1518   CREATININE 1.07 (H) 12/29/2023 0928      Component Value Date/Time   CALCIUM  9.4 12/29/2023 0928   ALKPHOS 86 12/29/2023 0928   AST 24 12/29/2023 0928   ALT 30 12/29/2023 0928   BILITOT 0.6 12/29/2023 0928       RADIOGRAPHIC STUDIES: CT Chest W Contrast Result Date: 01/01/2024 EXAM: CT CHEST WITH CONTRAST 12/29/2023 10:43:46 AM TECHNIQUE: CT of the chest was performed with the administration of 60 mL of iohexol  (OMNIPAQUE ) 300 MG/ML solution. Multiplanar reformatted images are provided for review. Automated exposure control, iterative reconstruction, and/or weight based adjustment of the mA/kV was utilized to reduce the radiation dose  to as low as reasonably achievable. COMPARISON: 11/24/2023 CLINICAL HISTORY: Non-small cell lung cancer (NSCLC), non-metastatic, assess treatment response. * Tracking Code: BO * FINDINGS: MEDIASTINUM: Moderate cardiomegaly. LAD coronary artery calcification. Aortic atherosclerosis. No pericardial effusion. The central airways are clear. No mediastinal or hilar adenopathy. No central pulmonary embolism on this non-dedicated exam. LUNGS AND PLEURA: Moderate centrilobular emphysema. Concurrent mild basilar predominant subpleural reticulation, greater right than left. Bibasilar scarring. Superior segment left lower lobe interstitial thickening and architectural distortion, adjacent to a fiducial include on image 67 / 5, similar. No local recurrence. No pneumothorax or pleural fluid. SOFT TISSUES/BONES: Osteopenia. Interval but nonacute bilateral rib fractures. Anterior left third rib fracture on image 49 / 2, somewhat expansile. Moderate T11 compression deformity with minimal ventral canal encroachment is not significantly changed. No supraclavicular adenopathy. UPPER ABDOMEN: Moderate hepatic steatosis. Normal adrenal glands. Limited images of the upper abdomen demonstrates no acute abnormality. IMPRESSION: 1. Radiation change in the left lower lobe, without local recurrence or metastatic disease. 2. Osteopenia with similar T11 compression deformity. New bilateral rib fractures; an anterior left third rib healing fracture is somewhat expansile. correlate with trauma in this area to exclude underlying metastasis. Recommend attention on follow-up. 3. Aortic atherosclerosis (ICD10-I70.0), coronary artery atherosclerosis, and emphysema (ICD10-J43.9). 4. Hepatic steatosis. Electronically signed by: Rockey Kilts MD 01/01/2024 12:09 PM EST RP Workstation: [id number]    ASSESSMENT AND PLAN: This is a very pleasant 73 years old white female with suspicious stage IA non-small cell lung presented with left lower lobe lung  nodule status post curative radiotherapy and currently on observation. She had repeat CT scan of the chest performed recently.  I personally independently reviewed the scan and discussed the result with the patient and her sister today.  Her scan showed no concerning findings for disease recurrence or metastasis but she continues to have osteopenia with compression fracture of T11 and new bilateral rib fractures. Assessment and Plan Assessment & Plan Stage IA non-small cell lung cancer of the left upper lobe, post-curative radiotherapy, under observation She remains asymptomatic and in the surveillance phase following curative radiotherapy for Stage IA non-small cell lung cancer of the left upper lobe. The most recent chest CT demonstrated no evidence of recurrence or metastatic disease. She reported no new symptoms or concerns related to lung cancer. - Reviewed most recent chest CT, which showed no evidence of recurrence or metastasis. - Provided copy of the scan to her. - Arranged repeat chest CT in six months for continued surveillance. - Planned to continue six-month interval  chest CTs for the first two years post-treatment, then transition to annual scans thereafter. - Scheduled follow-up visit in six months. The patient was advised to call immediately if she has any concerning symptoms in the interval. The patient voices understanding of current disease status and treatment options and is in agreement with the current care plan.  All questions were answered. The patient knows to call the clinic with any problems, questions or concerns. We can certainly see the patient much sooner if necessary.  The total time spent in the appointment was 20 minutes.  Disclaimer: This note was dictated with voice recognition software. Similar sounding words can inadvertently be transcribed and may not be corrected upon review.

## 2024-01-07 ENCOUNTER — Other Ambulatory Visit: Payer: Self-pay | Admitting: Family Medicine

## 2024-01-09 ENCOUNTER — Other Ambulatory Visit: Payer: Self-pay | Admitting: Family Medicine

## 2024-01-21 ENCOUNTER — Ambulatory Visit: Admitting: Sports Medicine

## 2024-01-21 ENCOUNTER — Encounter: Payer: Self-pay | Admitting: Sports Medicine

## 2024-01-21 DIAGNOSIS — R293 Abnormal posture: Secondary | ICD-10-CM | POA: Diagnosis not present

## 2024-01-21 DIAGNOSIS — M8000XA Age-related osteoporosis with current pathological fracture, unspecified site, initial encounter for fracture: Secondary | ICD-10-CM | POA: Diagnosis not present

## 2024-01-21 DIAGNOSIS — M19012 Primary osteoarthritis, left shoulder: Secondary | ICD-10-CM

## 2024-01-21 DIAGNOSIS — M5030 Other cervical disc degeneration, unspecified cervical region: Secondary | ICD-10-CM | POA: Diagnosis not present

## 2024-01-21 DIAGNOSIS — M62830 Muscle spasm of back: Secondary | ICD-10-CM | POA: Diagnosis not present

## 2024-01-21 NOTE — Progress Notes (Signed)
 "  Kimberly Walters - 73 y.o. female MRN [id number]  Date of birth: 1950/07/28  Office Visit Note: Visit Date: 01/21/2024 PCP: Lendia Boby LITTIE, NP-C Referred by: Lendia Boby LITTIE, NP-C  Subjective: Chief Complaint  Patient presents with   Left Shoulder - Follow-up   HPI: Kimberly Walters is a pleasant 73 y.o. female who presents today for f/u of left shoulder pain, osteoporosis; also with neck pain.  Left shoulder -has severe osteoarthritis, but we did perform ultrasound-guided glenohumeral joint injection back in 12/16/2023 which gave her significant relief of her pain, she also has improvement in range of motion.  She has been doing her home exercises and noticing improvement with this.  She is feeling more pain in the neck and the left side of the trapezius muscle here recently.  No numbness or tingling going down the arm.  Has been using Robaxin  500 mg does not feel this is helping significantly.  Also Tylenol .  Does have a history of osteoporosis, was seen on 12/22/2023, DEXA scan was ordered but has not been set up yet. Had DEXA scan ordered on 12/22/2023 - by [facility]. Has not received call about scheduling - asking about this.  Pertinent ROS were reviewed with the patient and found to be negative unless otherwise specified above in HPI.   Assessment & Plan: Visit Diagnoses:  1. Primary osteoarthritis, left shoulder   2. DDD (degenerative disc disease), cervical   3. Spasm of left trapezius muscle   4. Abnormal posture   5. Age-related osteoporosis with current pathological fracture, initial encounter    Plan: Impression is severe left shoulder glenohumeral joint osteoarthritis which improved significantly after ultrasound-guided injection.  She will continue her home exercises for this to maintain range of motion and function.  Can consider infrequent injections as needed given her OA if having flare.  She is dealing more with neck pain and trapezius pain which is a mix of  cervical DDD as well as postural abnormality and a hypertonic trapezius.  We discussed physical therapy for her, she prefers home therapy.  Did print out my handout for both cervical isometrics as well as scapular retraction exercises which my athletic trainer, Jinnie, did review with her in the room today.  Will perform these once daily.  Also recommended warm, moist heat, lidocaine  patches, heating pad with ROM.  Given that Robaxin  500 mg was not helpful, we will discontinue this going forward.  She may use Tylenol  Extra Strength as needed.   I will send a message to our scheduling about her DEXA scan so this can be hopefully ordered and scheduled in the near future.  Additional treatment considerations: Trigger point injection, cervical Facet inj  Follow-up: Return in about 6 weeks (around 03/03/2024) for L-trap, shoulder   Meds & Orders: No orders of the defined types were placed in this encounter.  No orders of the defined types were placed in this encounter.    Procedures: No procedures performed      Clinical History: No specialty comments available.  She reports that she quit smoking about 4 years ago. Her smoking use included cigarettes. She has a 27.5 pack-year smoking history. She has never used smokeless tobacco.  Recent Labs    03/03/23 1145 07/09/23 0857  HGBA1C 7.1* 8.4*    Objective:   Vital Signs: LMP 06/21/1973   Physical Exam  Gen: Well-appearing, in no acute distress; non-toxic CV: Well-perfused. Warm.  Resp: Breathing unlabored on room air; no wheezing.  Psych: Fluid speech in conversation; appropriate affect; normal thought process  Ortho Exam - Left shoulder: No shoulder protraction.  There is scapular kinesia left greater than right without winging.  Restriction and end range of motion with mild crepitus of the significantly improved range of motion which is pain-free compared to last visit.  - Neck: Limited flexion.  Positive TTP with muscular hypertonicity  over the left paraspinals and trapezius musculature.  Positive trigger point in the left trapezius muscle belly.  Bilateral shoulder protraction.  Imaging:  Narrative & Impression  EXAM: CT CERVICAL SPINE WITHOUT CONTRAST 11/24/2023 01:40:32 PM   TECHNIQUE: CT of the cervical spine was performed without the administration of intravenous contrast. Multiplanar reformatted images are provided for review. Automated exposure control, iterative reconstruction, and/or weight based adjustment of the mA/kV was utilized to reduce the radiation dose to as low as reasonably achievable.   COMPARISON: None available.   CLINICAL HISTORY: Fall   FINDINGS:   CERVICAL SPINE:   BONES AND ALIGNMENT: Reversal of the normal cervical lordosis. Trace anterolisthesis of C2 on C3, C3 on C4, and C7 on T1. Trace retrolisthesis of C4 on C5 and C5 on C6. No acute fracture or suspicious lesion.   DEGENERATIVE CHANGES: Advanced disc degeneration from C4-C5 through C6-C7 with mild degeneration at C3-C4 and C7-T1. Asymmetrically advanced facet arthrosis on the left at C2-C3 and on the right at C3-C4. No evidence of high grade spinal canal stenosis. At least moderate multilevel neural foraminal stenosis.   SOFT TISSUES: No prevertebral soft tissue swelling. Subcentimeter thyroid  nodules for which no follow up imaging is recommended. Upper chest reported separately.   IMPRESSION: 1. No acute cervical spine fracture.   Electronically signed by: Dasie Hamburg MD 11/24/2023 02:54 PM EST RP Workstation: [id number]    Narrative & Impression  CLINICAL DATA:  Fall and left shoulder pain. Limited range of motion.   EXAM: LEFT SHOULDER - 2+ VIEW   COMPARISON:  None Available.   FINDINGS: No acute fracture or dislocation. The bones are osteopenic. There is arthritic changes of the left shoulder with severe narrowing of the glenohumeral joint space and bone-on-bone contact. There is spurring of the  inferior humeral head. The soft tissues are unremarkable.   IMPRESSION: 1. No acute fracture or dislocation. 2. Severe arthritic changes of the left shoulder.     Electronically Signed   By: Vanetta Chou M.D.   On: 12/04/2023 14:36    Past Medical/Family/Surgical/Social History: Medications & Allergies reviewed per EMR, new medications updated. Patient Active Problem List   Diagnosis Date Noted   Former smoker 07/09/2023   Pain and swelling of right lower leg 07/09/2023   Decreased pedal pulses 07/09/2023   Hypercoagulable state 07/01/2023   Leg swelling 07/01/2023   Cellulitis of right lower extremity 07/01/2023   Malignant neoplasm of lower lobe of left lung (HCC) 03/20/2023   Solitary pulmonary nodule on lung CT 02/12/2023   Exercise hypoxemia 02/12/2023   Nausea 01/17/2023   History of Helicobacter pylori infection 01/17/2023   Type 2 diabetes mellitus with chronic kidney disease, without long-term current use of insulin (HCC) 01/08/2023   DOE (dyspnea on exertion) 12/24/2022   Vitamin D  deficiency 12/24/2022   History of pulmonary embolism 12/24/2022   Smokes 12/24/2022   Right upper quadrant abdominal tenderness without rebound tenderness 12/24/2022   Right upper quadrant abdominal pain with positive Beverley Sign 12/24/2022   Palpitations 12/24/2022   Eye swelling 07/31/2022   Factor V Leiden 07/31/2022  Acute rhinosinusitis 03/29/2022   Age-related osteoporosis without current pathological fracture 01/04/2022   Prediabetes 10/18/2021   Chronic petechia bilateral lower extremity 07/25/2021   Abdominal pain, epigastric 07/04/2020   Asymptomatic varicose veins of both lower extremities 02/18/2020   Bloating 10/15/2019   Aortic atherosclerosis 08/30/2019   Long term (current) use of anticoagulants 07/22/2019   Pulmonary embolism (HCC) 07/20/2019   Paroxysmal atrial fibrillation with RVR (HCC)    COPD GOLD 2 with ex desats 07/13/2019   Genetic predisposition to  cancer 11/17/2018   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    GERD (gastroesophageal reflux disease)    Gross hematuria    History of hiatal hernia    Hyperlipidemia    Hypertension    Mitral valve prolapse    Nodule of lower lobe of left lung    Paroxysmal atrial fibrillation with RVR (HCC)    Pleural effusion on left    Post-thrombotic syndrome of left lower extremity    Primary hypercoagulable state    Pulmonary emboli (HCC)    Pyelonephritis 2021   Upper respiratory infection, viral 04/02/2021   Family History  Problem Relation Age of Onset   Diabetes Mother    Heart disease Mother    Cancer Mother        Uterine   Diabetes Father    CAD Father    Heart attack Father    Hypothyroidism Sister    Melanoma Sister    Clotting disorder Sister    Stomach cancer Sister    Diabetes Sister    Diabetes Sister    Hypertension Sister    Hypothyroidism Sister    Diabetes Sister    Hypertension Sister    Hypothyroidism Sister    Cancer Sister    Cancer Sister    Diabetes Daughter    Diabetes Son    Diabetes Son    Breast cancer Half-Sister    Pancreatic cancer Nephew    Uterine cancer Niece    Ovarian cancer Other    Uterine cancer Other    Breast cancer Other    Colon cancer Neg Hx    Esophageal cancer Neg Hx    Past Surgical History:  Procedure Laterality Date   BIOPSY  07/19/2020   Procedure: BIOPSY;  Surgeon: Shila Gustav GAILS, MD;  Location:  [facility];  Service: Endoscopy;;   CARDIAC CATHETERIZATION     pt states she had one in 1989 and 1996 w/ Dr. Lavon   COLONOSCOPY     ESOPHAGOGASTRODUODENOSCOPY (EGD) WITH PROPOFOL  N/A 07/19/2020   Procedure: ESOPHAGOGASTRODUODENOSCOPY (EGD) WITH PROPOFOL ;  Surgeon: Shila Gustav GAILS, MD;  Location: [facility];  Service: Endoscopy;  Laterality: N/A;   FUDUCIAL PLACEMENT N/A 03/06/2023   Procedure: PLACEMENT OF FUDUCIAL;  Surgeon: Kerrin Elspeth BROCKS, MD;  Location: [facility] OR;  Service: Thoracic;  Laterality: N/A;   HYSTERECTOMY ABDOMINAL WITH SALPINGO-OOPHORECTOMY  1975   LYMPH NODE BIOPSY Right 1983  Neck   VIDEO BRONCHOSCOPY WITH ENDOBRONCHIAL NAVIGATION N/A 03/06/2023   Procedure: VIDEO BRONCHOSCOPY WITH ENDOBRONCHIAL NAVIGATION;  Surgeon: Kerrin Elspeth BROCKS, MD;  Location: [facility] OR;  Service: Thoracic;  Laterality: N/A;   Social History   Occupational History   Occupation: Retired    Associate Professor: [employer]  Tobacco Use   Smoking status: Former    Current packs/day: 0.00    Average packs/day: 0.5 packs/day for 55.0 years (27.5 ttl pk-yrs)    Types: Cigarettes    Quit date: 07/01/2019    Years since quitting: 4.5   Smokeless tobacco: Never  Vaping Use   Vaping status: Never Used  Substance and Sexual Activity   Alcohol use: No   Drug use: No   Sexual activity: Yes    Birth control/protection: Surgical, Post-menopausal   "

## 2024-01-21 NOTE — Progress Notes (Signed)
 Patient says that her shoulder is feeling much better. She is still having pain in the upper trapezius and into the left side of the neck. She denies any radicular symptoms in the left arm. She does feel she is able to move the arm from the shoulder better, and can actively reach overhead and forward.   She has seen Ronal Dragon to discuss osteoporosis since her last visit, and says that a repeat bone density scan was discussed. She would like to check on that today as she has not heard from anybody to schedule.  Patient was instructed in 10 minutes of therapeutic exercises for cervical isometrics and scapular retraction to improve strength, ROM and function according to my instructions and plan of care by a Certified Athletic Trainer during the office visit. A customized handout was provided and demonstration of proper technique shown and discussed. Patient did perform exercises and demonstrate understanding through teachback.  All questions discussed and answered.

## 2024-01-23 ENCOUNTER — Ambulatory Visit (HOSPITAL_COMMUNITY)
Admission: RE | Admit: 2024-01-23 | Discharge: 2024-01-23 | Disposition: A | Discharge status: Home / Self Care | Source: Ambulatory Visit | Attending: Physician Assistant | Admitting: Physician Assistant

## 2024-01-23 ENCOUNTER — Ambulatory Visit: Payer: Self-pay

## 2024-01-23 ENCOUNTER — Ambulatory Visit (INDEPENDENT_AMBULATORY_CARE_PROVIDER_SITE_OTHER)

## 2024-01-23 ENCOUNTER — Ambulatory Visit (HOSPITAL_COMMUNITY): Payer: Self-pay | Admitting: Physician Assistant

## 2024-01-23 ENCOUNTER — Encounter (HOSPITAL_COMMUNITY): Payer: Self-pay

## 2024-01-23 VITALS — BP 162/83 | HR 80 | Temp 97.6°F | Resp 18

## 2024-01-23 DIAGNOSIS — R051 Acute cough: Secondary | ICD-10-CM

## 2024-01-23 DIAGNOSIS — J189 Pneumonia, unspecified organism: Secondary | ICD-10-CM | POA: Insufficient documentation

## 2024-01-23 LAB — COMPREHENSIVE METABOLIC PANEL WITH GFR
ALT: 30 U/L (ref 0–44)
AST: 42 U/L — ABNORMAL HIGH (ref 15–41)
Albumin: 4.3 g/dL (ref 3.5–5.0)
Alkaline Phosphatase: 79 U/L (ref 38–126)
Anion gap: 14 (ref 5–15)
BUN: 22 mg/dL (ref 8–23)
CO2: 21 mmol/L — ABNORMAL LOW (ref 22–32)
Calcium: 9.8 mg/dL (ref 8.9–10.3)
Chloride: 105 mmol/L (ref 98–111)
Creatinine, Ser: 1.09 mg/dL — ABNORMAL HIGH (ref 0.44–1.00)
GFR, Estimated: 53 mL/min — ABNORMAL LOW
Glucose, Bld: 112 mg/dL — ABNORMAL HIGH (ref 70–99)
Potassium: 5.2 mmol/L — ABNORMAL HIGH (ref 3.5–5.1)
Sodium: 139 mmol/L (ref 135–145)
Total Bilirubin: 0.4 mg/dL (ref 0.0–1.2)
Total Protein: 7.3 g/dL (ref 6.5–8.1)

## 2024-01-23 LAB — CBC WITH DIFFERENTIAL/PLATELET
Abs Immature Granulocytes: 0.03 K/uL (ref 0.00–0.07)
Basophils Absolute: 0.1 K/uL (ref 0.0–0.1)
Basophils Relative: 1 %
Eosinophils Absolute: 0.2 K/uL (ref 0.0–0.5)
Eosinophils Relative: 3 %
HCT: 45.2 % (ref 36.0–46.0)
Hemoglobin: 15.1 g/dL — ABNORMAL HIGH (ref 12.0–15.0)
Immature Granulocytes: 1 %
Lymphocytes Relative: 22 %
Lymphs Abs: 1.4 K/uL (ref 0.7–4.0)
MCH: 31.7 pg (ref 26.0–34.0)
MCHC: 33.4 g/dL (ref 30.0–36.0)
MCV: 95 fL (ref 80.0–100.0)
Monocytes Absolute: 0.7 K/uL (ref 0.1–1.0)
Monocytes Relative: 11 %
Neutro Abs: 4.2 K/uL (ref 1.7–7.7)
Neutrophils Relative %: 62 %
Platelets: 203 K/uL (ref 150–400)
RBC: 4.76 MIL/uL (ref 3.87–5.11)
RDW: 13.4 % (ref 11.5–15.5)
WBC: 6.6 K/uL (ref 4.0–10.5)
nRBC: 0 % (ref 0.0–0.2)

## 2024-01-23 LAB — POCT INFLUENZA A/B
Influenza A, POC: NEGATIVE
Influenza B, POC: NEGATIVE

## 2024-01-23 LAB — POC SOFIA SARS ANTIGEN FIA: SARS Coronavirus 2 Ag: NEGATIVE

## 2024-01-23 MED ORDER — AMOXICILLIN-POT CLAVULANATE 875-125 MG PO TABS: 1.0000 | ORAL_TABLET | Freq: Two times a day (BID) | ORAL | 0 refills | Status: AC

## 2024-01-23 MED ORDER — PREDNISONE 20 MG PO TABS: 40.0000 mg | ORAL_TABLET | Freq: Every day | ORAL | 0 refills | Status: AC

## 2024-01-23 MED ORDER — BENZONATATE 100 MG PO CAPS: 100.0000 mg | ORAL_CAPSULE | Freq: Three times a day (TID) | ORAL | 0 refills | Status: AC

## 2024-01-23 MED ORDER — DOXYCYCLINE HYCLATE 100 MG PO CAPS: 100.0000 mg | ORAL_CAPSULE | Freq: Two times a day (BID) | ORAL | 0 refills | Status: AC

## 2024-01-23 NOTE — ED Provider Notes (Signed)
 " [facility]    CSN: [id number] Arrival date & time: 01/23/24  1644      History   Chief Complaint Chief Complaint  Patient presents with   Cough    Entered by patient   Nasal Congestion    HPI Kimberly Walters is a 74 y.o. female.   Patient presents today with a 3-day history of cough.  She reports associated nasal congestion, increased butyrin production, mild shortness of breath.  She does have a history of COPD and is on supplemental oxygen  but has not required additional oxygen  since her symptoms began.  She has been taking her Symbicort  and Spiriva  as prescribed and does not miss any doses.  She has been using her albuterol  with temporary improvement of symptoms.  She denies any fever, nausea, vomiting, diarrhea, chest pain.  She has been taking DayQuil and NyQuil without improvement of symptoms.  She denies any recent hospitalization for asthma.  Reports that she was treated with antibiotics 11/17/2023 (Augmentin ) but has not had additional antibiotics since then.  She was treated with prednisone  burst at the same time.  She does have a history of diabetes and her last A1c was 8.4% 07/09/2023.  She denies any known sick contacts but is interested in COVID and flu testing as she has been around family members for the holidays.  She is up-to-date on all of her recommended vaccinations including for pneumonia, COVID, flu, RSV.    Past Medical History:  Diagnosis Date   Anticoagulant-induced bleeding    Aortic atherosclerosis    Bereavement 11/16/2018   Cardiac arrest (HCC) 1989   pt states her heart stopped and they had to shock her heart and restarted it (w/ Dr. 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    GERD (gastroesophageal reflux disease)    Gross hematuria    History of hiatal hernia    Hyperlipidemia    Hypertension    Mitral  valve prolapse    Nodule of lower lobe of left lung    Paroxysmal atrial fibrillation with RVR (HCC)    Pleural effusion on left    Post-thrombotic syndrome of left lower extremity    Primary hypercoagulable state    Pulmonary emboli (HCC)    Pyelonephritis 2021   Upper respiratory infection, viral 04/02/2021    Patient Active Problem List   Diagnosis Date Noted   Former smoker 07/09/2023   Pain and swelling of right lower leg 07/09/2023   Decreased pedal pulses 07/09/2023   Hypercoagulable state 07/01/2023   Leg swelling 07/01/2023   Cellulitis of right lower extremity 07/01/2023   Malignant neoplasm of lower lobe of left lung (HCC) 03/20/2023   Solitary pulmonary nodule on lung CT 02/12/2023   Exercise hypoxemia 02/12/2023   Nausea 01/17/2023   History of Helicobacter pylori infection 01/17/2023   Type 2 diabetes mellitus with chronic kidney disease, without long-term current use of insulin (HCC) 01/08/2023   DOE (dyspnea on exertion) 12/24/2022   Vitamin D  deficiency 12/24/2022   History of pulmonary embolism 12/24/2022   Smokes 12/24/2022   Right upper quadrant abdominal tenderness without rebound tenderness 12/24/2022   Right upper quadrant abdominal pain with positive Murphy Sign 12/24/2022   Palpitations 12/24/2022   Eye swelling 07/31/2022   Factor V Leiden 07/31/2022   Acute rhinosinusitis 03/29/2022   Age-related osteoporosis  without current pathological fracture 01/04/2022   Prediabetes 10/18/2021   Chronic petechia bilateral lower extremity 07/25/2021   Abdominal pain, epigastric 07/04/2020   Asymptomatic varicose veins of both lower extremities 02/18/2020   Bloating 10/15/2019   Aortic atherosclerosis 08/30/2019   Long term (current) use of anticoagulants 07/22/2019   Pulmonary embolism (HCC) 07/20/2019   Paroxysmal atrial fibrillation with RVR (HCC)    COPD GOLD 2 with ex desats 07/13/2019   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 11/17/2018   Post-thrombotic  syndrome of left lower extremity 11/16/2018   Hypertension 11/16/2018   Mitral valve prolapse 11/16/2018   Tobacco use 11/16/2018    Past Surgical History:  Procedure Laterality Date   BIOPSY  07/19/2020   Procedure: BIOPSY;  Surgeon: Shila Gustav GAILS, MD;  Location: [facility];  Service: Endoscopy;;   CARDIAC CATHETERIZATION     pt states she had one in 1989 and 1996 w/ Dr. Lavon   COLONOSCOPY     ESOPHAGOGASTRODUODENOSCOPY (EGD) WITH PROPOFOL  N/A 07/19/2020   Procedure: ESOPHAGOGASTRODUODENOSCOPY (EGD) WITH PROPOFOL ;  Surgeon: Shila Gustav GAILS, MD;  Location: [facility];  Service: Endoscopy;  Laterality: N/A;   FUDUCIAL PLACEMENT N/A 03/06/2023   Procedure: PLACEMENT OF FUDUCIAL;  Surgeon: Kerrin Elspeth BROCKS, MD;  Location: [facility] OR;  Service: Thoracic;  Laterality: N/A;   HYSTERECTOMY ABDOMINAL WITH SALPINGO-OOPHORECTOMY  1975   LYMPH NODE BIOPSY Right 1983   Neck   VIDEO BRONCHOSCOPY WITH ENDOBRONCHIAL NAVIGATION N/A 03/06/2023   Procedure: VIDEO BRONCHOSCOPY WITH ENDOBRONCHIAL NAVIGATION;  Surgeon: Kerrin Elspeth BROCKS, MD;  Location: [facility] OR;  Service: Thoracic;  Laterality: N/A;    OB History   No obstetric history on file.      Home Medications    Prior to Admission medications  Medication Sig Start Date End Date Taking? Authorizing Provider  amoxicillin -clavulanate (AUGMENTIN ) 875-125 MG tablet Take 1 tablet by mouth every 12 (twelve) hours. 01/23/24  Yes Jadea Shiffer K, PA-C  benzonatate  (TESSALON ) 100 MG capsule Take 1 capsule (100 mg total) by mouth every 8 (eight) hours. 01/23/24  Yes Walda Hertzog K, PA-C  doxycycline  (VIBRAMYCIN ) 100 MG capsule Take 1 capsule (100 mg total) by mouth 2 (two) times daily. 01/23/24  Yes Marquerite Forsman K, PA-C  predniSONE  (DELTASONE ) 20 MG tablet Take 2 tablets (40 mg total) by mouth daily for 4 days. 01/23/24 01/27/24 Yes Davonne Baby K, PA-C  Accu-Chek Softclix Lancets lancets 4 (four) times daily. 07/11/23   [provider]   acetaminophen  (TYLENOL ) 500 MG tablet Take 500 mg by mouth every 6 (six) hours as needed for moderate pain.    [provider]  albuterol  (VENTOLIN  HFA) 108 (90 Base) MCG/ACT inhaler INHALE 2 PUFFS BY MOUTH EVERY 6 HOURS AS NEEDED FOR WHEEZING OR SHORTNESS OF BREATH 06/24/23   Henson, Vickie L, NP-C  atorvastatin  (LIPITOR) 20 MG tablet Take 1 tablet by mouth once daily 01/06/24   O'Neal, Darryle Ned, MD  betamethasone  valerate ointment (VALISONE ) 0.1 % Apply 1 Application topically 2 (two) times daily. Patient not taking: Reported on 01/23/2024 10/16/23   Lendia Nordmann L, NP-C  Blood Glucose Monitoring Suppl DEVI 1 each by Does not apply route in the morning, at noon, and at bedtime. May substitute to any manufacturer covered by patient's insurance. 07/10/23   Henson, Vickie L, NP-C  diltiazem  (DILACOR XR ) 120 MG 24 hr capsule Take 1 capsule (120 mg total) by mouth daily. 01/31/23   O'NealDarryle Ned, MD  FARXIGA  10 MG TABS tablet Take 1 tablet  by mouth once daily 01/05/24   Henson, Vickie L, NP-C  gabapentin  (NEURONTIN ) 300 MG capsule Take 1 capsule (300 mg total) by mouth 2 (two) times daily. Patient not taking: Reported on 01/23/2024 09/18/23   Lendia Nordmann L, NP-C  Glucose Blood (BLOOD GLUCOSE TEST STRIPS) STRP 1 each by In Vitro route 2 (two) times daily. May substitute to any manufacturer covered by patient's insurance. 07/10/23   Henson, Vickie L, NP-C  lisinopril  (ZESTRIL ) 40 MG tablet Take 1 tablet by mouth once daily 01/05/24   Henson, Vickie L, NP-C  methocarbamol  (ROBAXIN ) 500 MG tablet Take 1 tablet (500 mg total) by mouth every 8 (eight) hours as needed for muscle spasms. Patient not taking: Reported on 01/23/2024 12/04/23   Lendia Nordmann CROME, NP-C  SYMBICORT  80-4.5 MCG/ACT inhaler Inhale 2 puffs by mouth twice daily 01/07/24   Henson, Vickie L, NP-C  Tiotropium Bromide Monohydrate  (SPIRIVA  RESPIMAT) 2.5 MCG/ACT AERS INHALE 2 SPRAY(S) BY MOUTH ONCE DAILY AT 8 PM 10/22/23   Henson,  Vickie L, NP-C  Vitamin D , Cholecalciferol, 25 MCG (1000 UT) TABS Take 1 tablet by mouth once daily 05/14/23   Henson, Vickie L, NP-C  XARELTO  20 MG TABS tablet TAKE 1 TABLET BY MOUTH ONCE DAILY WITH SUPPER 01/09/24   Lendia Nordmann CROME, NP-C    Family History Family History  Problem Relation Age of Onset   Diabetes Mother    Heart disease Mother    Cancer Mother        Uterine   Diabetes Father    CAD Father    Heart attack Father    Hypothyroidism Sister    Melanoma Sister    Clotting disorder Sister    Stomach cancer Sister    Diabetes Sister    Diabetes Sister    Hypertension Sister    Hypothyroidism Sister    Diabetes Sister    Hypertension Sister    Hypothyroidism Sister    Cancer Sister    Cancer Sister    Diabetes Daughter    Diabetes Son    Diabetes Son    Breast cancer Half-Sister    Pancreatic cancer Nephew    Uterine cancer Niece    Ovarian cancer Other    Uterine cancer Other    Breast cancer Other    Colon cancer Neg Hx    Esophageal cancer Neg Hx     Social History Social History[1]   Allergies   Patient has no known allergies.   Review of Systems Review of Systems  Constitutional:  Positive for activity change. Negative for appetite change, fatigue and fever.  HENT:  Positive for congestion. Negative for sinus pressure, sneezing and sore throat.   Respiratory:  Positive for cough and shortness of breath.   Cardiovascular:  Negative for chest pain.  Gastrointestinal:  Negative for abdominal pain, diarrhea, nausea and vomiting.     Physical Exam Triage Vital Signs ED Triage Vitals [01/23/24 1705]  Encounter Vitals Group     BP (!) 162/83     Girls Systolic BP Percentile      Girls Diastolic BP Percentile      Boys Systolic BP Percentile      Boys Diastolic BP Percentile      Pulse Rate 80     Resp 18     Temp 97.6 F (36.4 C)     Temp Source Oral     SpO2 92 %     Weight      Height  Head Circumference      Peak Flow       Pain Score 0     Pain Loc      Pain Education      Exclude from Growth Chart    No data found.  Updated Vital Signs BP (!) 162/83 (BP Location: Left Arm)   Pulse 80   Temp 97.6 F (36.4 C) (Oral)   Resp 18   LMP 06/21/1973   SpO2 92%   Visual Acuity Right Eye Distance:   Left Eye Distance:   Bilateral Distance:    Right Eye Near:   Left Eye Near:    Bilateral Near:     Physical Exam Vitals reviewed.  Constitutional:      General: She is awake. She is not in acute distress.    Appearance: Normal appearance. She is well-developed. She is not ill-appearing.     Comments: Very pleasant female appears stated age in no acute distress sitting comfortably in exam room  HENT:     Head: Normocephalic and atraumatic.     Right Ear: Tympanic membrane, ear canal and external ear normal. Tympanic membrane is not erythematous or bulging.     Left Ear: Tympanic membrane, ear canal and external ear normal. Tympanic membrane is not erythematous or bulging.     Nose:     Right Sinus: No maxillary sinus tenderness or frontal sinus tenderness.     Left Sinus: No maxillary sinus tenderness or frontal sinus tenderness.     Mouth/Throat:     Pharynx: Uvula midline. No oropharyngeal exudate or posterior oropharyngeal erythema.  Cardiovascular:     Rate and Rhythm: Normal rate and regular rhythm.     Heart sounds: Normal heart sounds, S1 normal and S2 normal. No murmur heard. Pulmonary:     Effort: Pulmonary effort is normal.     Breath sounds: Examination of the right-lower field reveals rales. Rales present. No wheezing or rhonchi.  Psychiatric:        Behavior: Behavior is cooperative.      UC Treatments / Results  Labs (all labs ordered are listed, but only abnormal results are displayed) Labs Reviewed  CBC WITH DIFFERENTIAL/PLATELET  COMPREHENSIVE METABOLIC PANEL WITH GFR  POC SOFIA SARS ANTIGEN FIA  POCT INFLUENZA A/B    EKG   Radiology No results  found.  Procedures Procedures (including critical care time)  Medications Ordered in UC Medications - No data to display  Initial Impression / Assessment and Plan / UC Course  I have reviewed the triage vital signs and the nursing notes.  Pertinent labs & imaging results that were available during my care of the patient were reviewed by me and considered in my medical decision making (see chart for details).     Patient is well-appearing, afebrile, nontoxic, nontachycardic.  COVID and flu testing were obtained as this would change our management if she was positive and she has only had symptoms for 3 days due to her history of COPD she would be high risk for complications and therefore candidate for antiviral therapy but  this was negative in clinic.  She did have rales on exam and chest x-ray was obtained that showed patchy bibasilar opacities based on my primary read.  Will treat for CAP.  She was on Augmentin  and doxycycline  twice daily for 10 days.  No indication for dose adjustment based on metabolic panel from 12/29/2023 with creatinine of 1.07 and calculated creatinine clearance of 62 mg/min.  CBC and  CMP were obtained and if she has significant leukocytosis or abnormal kidney function she would need to go to the ER.  She was encouraged to use Mucinex , Flonase , nasal saline/sinus rinses.  Will start prednisone  40 mg for 4 days.  Discussed that she is not to take NSAIDs with this medication due to risk of GI bleeding.  Can use Tessalon  for cough.  She has an appointment scheduled with her primary care on Tuesday (01/27/2024) and was strongly encouraged to keep this appointment for recheck.  We discussed that if her symptoms worsen in any way and she has high fever, worsening cough, shortness of breath, weakness she needs to go to the emergency room immediately.  Return precautions given.  Excuse note provided.  Final Clinical Impressions(s) / UC Diagnoses   Final diagnoses:  Community acquired  pneumonia, unspecified laterality  Acute cough     Discharge Instructions      We are treating you for pneumonia.  Start Augmentin  twice daily as well as doxycycline  twice daily for 10 days.  Stay out of the sun while on this medication.  Take prednisone  40 mg for 4 days.  Do not take NSAIDs with this medication including aspirin, ibuprofen/Advil, naproxen/Aleve.  Use Tessalon  for cough.  Take Mucinex , Tylenol , nasal saline/sinus rinses for additional symptom relief.  Continue your COPD medications as previously prescribed.  Follow-up with your primary care next week as scheduled.  I will contact you if your blood work is abnormal.  If you are not feeling better within 1 to 2 days or if anything worsens and you have high fever, worsening cough, shortness of breath, weakness, increased oxygen  need you need to go to the ER.     ED Prescriptions     Medication Sig Dispense Auth. Provider   amoxicillin -clavulanate (AUGMENTIN ) 875-125 MG tablet Take 1 tablet by mouth every 12 (twelve) hours. 20 tablet Emryn Flanery K, PA-C   doxycycline  (VIBRAMYCIN ) 100 MG capsule Take 1 capsule (100 mg total) by mouth 2 (two) times daily. 20 capsule Sinda Leedom K, PA-C   benzonatate  (TESSALON ) 100 MG capsule Take 1 capsule (100 mg total) by mouth every 8 (eight) hours. 21 capsule Broedy Osbourne K, PA-C   predniSONE  (DELTASONE ) 20 MG tablet Take 2 tablets (40 mg total) by mouth daily for 4 days. 8 tablet Porchea Charrier K, PA-C      PDMP not reviewed this encounter.     [1]  Social History Tobacco Use   Smoking status: Former    Current packs/day: 0.00    Average packs/day: 0.5 packs/day for 55.0 years (27.5 ttl pk-yrs)    Types: Cigarettes    Quit date: 07/01/2019    Years since quitting: 4.5   Smokeless tobacco: Never  Vaping Use   Vaping status: Never Used  Substance Use Topics   Alcohol use: No   Drug use: No     Sherrell Rocky POUR, PA-C 01/23/24 1841  "

## 2024-01-23 NOTE — Discharge Instructions (Signed)
 We are treating you for pneumonia.  Start Augmentin  twice daily as well as doxycycline  twice daily for 10 days.  Stay out of the sun while on this medication.  Take prednisone  40 mg for 4 days.  Do not take NSAIDs with this medication including aspirin, ibuprofen/Advil, naproxen/Aleve.  Use Tessalon  for cough.  Take Mucinex , Tylenol , nasal saline/sinus rinses for additional symptom relief.  Continue your COPD medications as previously prescribed.  Follow-up with your primary care next week as scheduled.  I will contact you if your blood work is abnormal.  If you are not feeling better within 1 to 2 days or if anything worsens and you have high fever, worsening cough, shortness of breath, weakness, increased oxygen  need you need to go to the ER.

## 2024-01-23 NOTE — ED Triage Notes (Signed)
 Patient reports having a productive cough with light yellow sputum and nasal congestion x 3 days.  Patient wears O2 when out and about. Patient took it off when she entered the room.  Patient states she has been taking Nyquil for her symptoms.

## 2024-01-23 NOTE — Telephone Encounter (Signed)
 FYI Only or Action Required?: FYI only for provider: UC.  Patient was last seen in primary care on 12/04/2023 by Lendia Nordmann L, NP-C.  Called Nurse Triage reporting Cough.  Symptoms began several days ago.  Interventions attempted: OTC medications: dayquil, nyquil.  Symptoms are: gradually worsening.  Triage Disposition: See Physician Within 24 Hours  Patient/caregiver understands and will follow disposition?: Yes  Reason for Disposition  [1] Known COPD or other severe lung disease (i.e., bronchiectasis, cystic fibrosis, lung surgery) AND [2] symptoms getting worse (i.e., increased sputum purulence or amount, increased breathing difficulty  Answer Assessment - Initial Assessment Questions Pt reports productive cough x 3 days. Pt has been taking dayquil and nyquil with no improvement. Patient using her symbicort  and spiriva  as prescribed. States has not needed to use her albuterol . Denies SOB. Mild wheezing that resolves with coughing.   Advised pt to request list of safe OTC medications for future reference d/t her medical hx.  Pt agreeable to UC and was assisted in scheduling.   1. ONSET: When did the cough begin?      3 days 3. SPUTUM: Describe the color of your sputum (e.g., none, dry cough; clear, white, yellow, green)     Yellow 5. DIFFICULTY BREATHING: Are you having difficulty breathing? If Yes, ask: How bad is it? (e.g., mild, moderate, severe)      Denies 6. FEVER: Do you have a fever? If Yes, ask: What is your temperature, how was it measured, and when did it start?     Denies 7. CARDIAC HISTORY: Do you have any history of heart disease? (e.g., heart attack, congestive heart failure)      Hx of cardiac arrest/balloons, HTN 8. LUNG HISTORY: Do you have any history of lung disease?  (e.g., pulmonary embolus, asthma, emphysema)     COPD  Protocols used: Cough - Acute Productive-A-AH Copied from CRM #[id number]. Topic: Clinical - Medical Advice >> Jan 23, 2024  8:17 AM Donna BRAVO wrote: Reason for CRM:  nightquil since Tuesday night -coughing  -congestion -has COPD -Patient would like something called in to :  [facility] [address], [place] - [address] [place] [address] Phone: [phone] Fax: [phone] Hours: Not open 24 hours  Past Medical History:  Diagnosis Date   Anticoagulant-induced bleeding    Aortic atherosclerosis    Bereavement 11/16/2018   Cardiac arrest (HCC) 1989   pt states her heart stopped and they had to shock her heart and restarted it (w/ Dr. 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    GERD (gastroesophageal reflux disease)    Gross hematuria    History of hiatal hernia    Hyperlipidemia    Hypertension    Mitral valve prolapse    Nodule of lower lobe of left lung    Paroxysmal atrial fibrillation with RVR (HCC)    Pleural effusion on left    Post-thrombotic syndrome of left lower extremity    Primary hypercoagulable state    Pulmonary emboli (HCC)    Pyelonephritis 2021   Upper respiratory infection, viral 04/02/2021

## 2024-01-27 ENCOUNTER — Other Ambulatory Visit: Payer: Self-pay | Admitting: Cardiovascular Disease

## 2024-01-27 ENCOUNTER — Telehealth: Payer: Self-pay | Admitting: Physician Assistant

## 2024-01-27 ENCOUNTER — Encounter: Payer: Self-pay | Admitting: Family Medicine

## 2024-01-27 ENCOUNTER — Ambulatory Visit: Payer: Self-pay | Admitting: Family Medicine

## 2024-01-27 ENCOUNTER — Ambulatory Visit (INDEPENDENT_AMBULATORY_CARE_PROVIDER_SITE_OTHER): Admitting: Family Medicine

## 2024-01-27 VITALS — BP 112/66 | HR 90 | Temp 97.6°F | Ht 64.0 in

## 2024-01-27 DIAGNOSIS — E559 Vitamin D deficiency, unspecified: Secondary | ICD-10-CM | POA: Diagnosis not present

## 2024-01-27 DIAGNOSIS — I480 Paroxysmal atrial fibrillation: Secondary | ICD-10-CM | POA: Diagnosis not present

## 2024-01-27 DIAGNOSIS — D6851 Activated protein C resistance: Secondary | ICD-10-CM | POA: Diagnosis not present

## 2024-01-27 DIAGNOSIS — J449 Chronic obstructive pulmonary disease, unspecified: Secondary | ICD-10-CM

## 2024-01-27 DIAGNOSIS — N1831 Chronic kidney disease, stage 3a: Secondary | ICD-10-CM | POA: Diagnosis not present

## 2024-01-27 DIAGNOSIS — Z7901 Long term (current) use of anticoagulants: Secondary | ICD-10-CM

## 2024-01-27 DIAGNOSIS — E1122 Type 2 diabetes mellitus with diabetic chronic kidney disease: Secondary | ICD-10-CM | POA: Diagnosis not present

## 2024-01-27 DIAGNOSIS — J439 Emphysema, unspecified: Secondary | ICD-10-CM

## 2024-01-27 DIAGNOSIS — I10 Essential (primary) hypertension: Secondary | ICD-10-CM

## 2024-01-27 DIAGNOSIS — Z7984 Long term (current) use of oral hypoglycemic drugs: Secondary | ICD-10-CM

## 2024-01-27 DIAGNOSIS — M8080XA Other osteoporosis with current pathological fracture, unspecified site, initial encounter for fracture: Secondary | ICD-10-CM

## 2024-01-27 DIAGNOSIS — J189 Pneumonia, unspecified organism: Secondary | ICD-10-CM | POA: Diagnosis not present

## 2024-01-27 DIAGNOSIS — I700 Atherosclerosis of aorta: Secondary | ICD-10-CM | POA: Diagnosis not present

## 2024-01-27 LAB — CBC WITH DIFFERENTIAL/PLATELET
Basophils Absolute: 0 K/uL (ref 0.0–0.1)
Basophils Relative: 0.3 % (ref 0.0–3.0)
Eosinophils Absolute: 0.1 K/uL (ref 0.0–0.7)
Eosinophils Relative: 1.1 % (ref 0.0–5.0)
HCT: 43.2 % (ref 36.0–46.0)
Hemoglobin: 14.4 g/dL (ref 12.0–15.0)
Lymphocytes Relative: 11.9 % — ABNORMAL LOW (ref 12.0–46.0)
Lymphs Abs: 1.1 K/uL (ref 0.7–4.0)
MCHC: 33.3 g/dL (ref 30.0–36.0)
MCV: 93.9 fl (ref 78.0–100.0)
Monocytes Absolute: 0.5 K/uL (ref 0.1–1.0)
Monocytes Relative: 5.8 % (ref 3.0–12.0)
Neutro Abs: 7.4 K/uL (ref 1.4–7.7)
Neutrophils Relative %: 80.9 % — ABNORMAL HIGH (ref 43.0–77.0)
Platelets: 207 K/uL (ref 150.0–400.0)
RBC: 4.6 Mil/uL (ref 3.87–5.11)
RDW: 14.5 % (ref 11.5–15.5)
WBC: 9.1 K/uL (ref 4.0–10.5)

## 2024-01-27 LAB — COMPREHENSIVE METABOLIC PANEL WITH GFR
ALT: 23 U/L (ref 3–35)
AST: 18 U/L (ref 5–37)
Albumin: 4.4 g/dL (ref 3.5–5.2)
Alkaline Phosphatase: 67 U/L (ref 39–117)
BUN: 26 mg/dL — ABNORMAL HIGH (ref 6–23)
CO2: 26 meq/L (ref 19–32)
Calcium: 10.1 mg/dL (ref 8.4–10.5)
Chloride: 105 meq/L (ref 96–112)
Creatinine, Ser: 1.14 mg/dL (ref 0.40–1.20)
GFR: 47.53 mL/min — ABNORMAL LOW
Glucose, Bld: 186 mg/dL — ABNORMAL HIGH (ref 70–99)
Potassium: 3.8 meq/L (ref 3.5–5.1)
Sodium: 141 meq/L (ref 135–145)
Total Bilirubin: 0.8 mg/dL (ref 0.2–1.2)
Total Protein: 7 g/dL (ref 6.0–8.3)

## 2024-01-27 LAB — TSH: TSH: 0.95 u[IU]/mL (ref 0.35–5.50)

## 2024-01-27 LAB — VITAMIN D 25 HYDROXY (VIT D DEFICIENCY, FRACTURES): VITD: 28.93 ng/mL — ABNORMAL LOW (ref 30.00–100.00)

## 2024-01-27 LAB — HEMOGLOBIN A1C: Hgb A1c MFr Bld: 8.8 % — ABNORMAL HIGH (ref 4.6–6.5)

## 2024-01-27 NOTE — Telephone Encounter (Signed)
 This was faxed today to [facility].

## 2024-01-27 NOTE — Patient Instructions (Addendum)
 Please go downstairs for labs before you leave.  I will be in touch with your results and with recommendations.   Please call [facility] to check on the bone density test. It was ordered in early December.

## 2024-01-27 NOTE — Progress Notes (Signed)
 Her diabetes is worse. Her A1c is now 8.8%. Has she taken metformin  in the past or any other oral medications for her diabetes? We need to add medication to lower her blood sugars. Also, her vitamin D  is slightly low. I recommend she take over the counter vitamin D3 2,000 IUs daily.

## 2024-01-27 NOTE — Telephone Encounter (Signed)
 Pt checking the status of the order for the bone scan.

## 2024-01-27 NOTE — Progress Notes (Signed)
 "  Subjective:     Patient ID: Kimberly Walters, female    DOB: [birth year], 74 y.o.   MRN: [id number]  Chief Complaint  Patient presents with   Medical Management of Chronic Issues    3 month f/u    HPI  Discussed the use of AI scribe software for clinical note transcription with the patient, who gave verbal consent to proceed.  History of Present Illness Kimberly Walters is a 74 year old female with diabetes who presents for follow-up on chronic health conditions.  Respiratory symptoms and recent pneumonia - Diagnosed with pneumonia at urgent care on January 2nd after several days of fatigue, headache, and cough beginning on New Year's Eve - Treated with Augmentin , doxycycline , prednisone , and Tessalon  Perles - Completed prednisone  course - Breathing has improved since treatment - she is using oxygen - chronic   Glycemic control - Diabetes control is suboptimal - Prior hemoglobin A1c was 8.4 - Blood glucose readings increased after injection and prednisone  therapy - Takes Farxiga  daily for diabetes management - Fasting blood sugar was 151 mg/dL this morning  Cardiac arrhythmia and anticoagulation - Afib in past, hx of PE  - Takes Xarelto  for anticoagulation  Hyperlipidemia - Takes Lipitor 20 mg daily  Osteoporosis management -on treatment for osteoporosis - Takes vitamin D3 2000 IU daily - Bone density test has been ordered and is pending  Electrolyte abnormality - Potassium was elevated at urgent care visit - Awaiting follow-up blood work to recheck potassium level     Health Maintenance Due  Topic Date Due   FOOT EXAM  Never done   Mammogram  05/23/2023   Medicare Annual Wellness (AWV)  01/29/2024    Past Medical History:  Diagnosis Date   Anticoagulant-induced bleeding    Aortic atherosclerosis    Bereavement 11/16/2018   Cardiac arrest (HCC) 1989   pt states her heart stopped and they had to shock her heart and restarted it (w/ Dr. Lavon)   Clotting  disorder    Diabetes mellitus without complication (HCC)    Emphysema, unspecified (HCC)    Factor V Leiden mutation    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    GERD (gastroesophageal reflux disease)    Gross hematuria    History of hiatal hernia    Hyperlipidemia    Hypertension    Mitral valve prolapse    Nodule of lower lobe of left lung    Paroxysmal atrial fibrillation with RVR (HCC)    Pleural effusion on left    Post-thrombotic syndrome of left lower extremity    Primary hypercoagulable state    Pulmonary emboli (HCC)    Pyelonephritis 2021   Upper respiratory infection, viral 04/02/2021    Past Surgical History:  Procedure Laterality Date   BIOPSY  07/19/2020   Procedure: BIOPSY;  Surgeon: Shila Gustav GAILS, MD;  Location: [facility];  Service: Endoscopy;;   CARDIAC CATHETERIZATION     pt states she had one in 1989 and 1996 w/ Dr. Lavon   COLONOSCOPY     ESOPHAGOGASTRODUODENOSCOPY (EGD) WITH PROPOFOL  N/A 07/19/2020   Procedure: ESOPHAGOGASTRODUODENOSCOPY (EGD) WITH PROPOFOL ;  Surgeon: Shila Gustav GAILS, MD;  Location: [facility];  Service: Endoscopy;  Laterality: N/A;   FUDUCIAL PLACEMENT N/A 03/06/2023   Procedure: PLACEMENT OF FUDUCIAL;  Surgeon: Kerrin Elspeth BROCKS, MD;  Location: [facility] OR;  Service: Thoracic;  Laterality: N/A;   HYSTERECTOMY ABDOMINAL WITH SALPINGO-OOPHORECTOMY  1975   LYMPH NODE BIOPSY Right 1983   Neck   VIDEO  BRONCHOSCOPY WITH ENDOBRONCHIAL NAVIGATION N/A 03/06/2023   Procedure: VIDEO BRONCHOSCOPY WITH ENDOBRONCHIAL NAVIGATION;  Surgeon: Kerrin Elspeth BROCKS, MD;  Location: [facility] OR;  Service: Thoracic;  Laterality: N/A;    Family History  Problem Relation Age of Onset   Diabetes Mother    Heart disease Mother    Cancer Mother        Uterine   Diabetes Father    CAD Father    Heart attack Father    Hypothyroidism Sister    Melanoma Sister    Clotting disorder Sister    Stomach cancer Sister    Diabetes Sister    Diabetes Sister     Hypertension Sister    Hypothyroidism Sister    Diabetes Sister    Hypertension Sister    Hypothyroidism Sister    Cancer Sister    Cancer Sister    Diabetes Daughter    Diabetes Son    Diabetes Son    Breast cancer Half-Sister    Pancreatic cancer Nephew    Uterine cancer Niece    Ovarian cancer Other    Uterine cancer Other    Breast cancer Other    Colon cancer Neg Hx    Esophageal cancer Neg Hx     Social History   Socioeconomic History   Marital status: Widowed    Spouse name: Not on file   Number of children: 3   Years of education: Not on file   Highest education level: GED or equivalent  Occupational History   Occupation: Retired    Associate Professor: [employer]  Tobacco Use   Smoking status: Former    Current packs/day: 0.00    Average packs/day: 0.5 packs/day for 55.0 years (27.5 ttl pk-yrs)    Types: Cigarettes    Quit date: 07/01/2019    Years since quitting: 4.5   Smokeless tobacco: Never  Vaping Use   Vaping status: Never Used  Substance and Sexual Activity   Alcohol use: No   Drug use: No   Sexual activity: Yes    Birth control/protection: Surgical, Post-menopausal  Other Topics Concern   Not on file  Social History Narrative   Current Social History 09/12/2020      Patient lives with 2 sons and granddaughter in a one story home. There are 3 steps with handrail up to the entrance the patient uses.       Patient's method of transportation is personal car.      The highest level of education was GED      The patient currently retired from [employer].      Identified important Relationships are my children, my grandchildren, my great-grandchildren, and my sisters      Pets : dog and Brewing Technologist / Fun: read, watch movies, go walking, and just do things with the children.      Current Stressors: dog      Religious / Personal Beliefs: Old fashioned [employer].       Pt lives with her 2 sons and granddaughter-2025            Social  Drivers of Health   Tobacco Use: Medium Risk (01/27/2024)   Patient History    Smoking Tobacco Use: Former    Smokeless Tobacco Use: Never    Passive Exposure: Not on file  Financial Resource Strain: Medium Risk (01/29/2023)   Overall Financial Resource Strain (CARDIA)    Difficulty of Paying Living Expenses: Somewhat hard  Food Insecurity:  No Food Insecurity (01/29/2023)   Hunger Vital Sign    Worried About Running Out of Food in the Last Year: Never true    Ran Out of Food in the Last Year: Never true  Transportation Needs: No Transportation Needs (01/29/2023)   PRAPARE - Administrator, Civil Service (Medical): No    Lack of Transportation (Non-Medical): No  Physical Activity: Inactive (01/29/2023)   Exercise Vital Sign    Days of Exercise per Week: 0 days    Minutes of Exercise per Session: 0 min  Stress: No Stress Concern Present (01/29/2023)   [employer] of Occupational Health - Occupational Stress Questionnaire    Feeling of Stress : Not at all  Social Connections: Moderately Isolated (01/29/2023)   Social Connection and Isolation Panel    Frequency of Communication with Friends and Family: More than three times a week    Frequency of Social Gatherings with Friends and Family: Three times a week    Attends Religious Services: 1 to 4 times per year    Active Member of Clubs or Organizations: No    Attends Banker Meetings: Never    Marital Status: Widowed  Intimate Partner Violence: Not At Risk (01/05/2024)   Epic    Fear of Current or Ex-Partner: No    Emotionally Abused: No    Physically Abused: No    Sexually Abused: No  Depression (PHQ2-9): Low Risk (11/17/2023)   Depression (PHQ2-9)    PHQ-2 Score: 0  Alcohol Screen: Low Risk (01/29/2023)   Alcohol Screen    Last Alcohol Screening Score (AUDIT): 0  Housing: Low Risk (01/05/2024)   Epic    Unable to Pay for Housing in the Last Year: No    Number of Times Moved in the Last Year: 0    Homeless  in the Last Year: No  Utilities: Not At Risk (01/29/2023)   AHC Utilities    Threatened with loss of utilities: No  Health Literacy: Adequate Health Literacy (01/29/2023)   B1300 Health Literacy    Frequency of need for help with medical instructions: Never    Outpatient Medications Prior to Visit  Medication Sig Dispense Refill   Accu-Chek Softclix Lancets lancets 4 (four) times daily.     acetaminophen  (TYLENOL ) 500 MG tablet Take 500 mg by mouth every 6 (six) hours as needed for moderate pain.     albuterol  (VENTOLIN  HFA) 108 (90 Base) MCG/ACT inhaler INHALE 2 PUFFS BY MOUTH EVERY 6 HOURS AS NEEDED FOR WHEEZING OR SHORTNESS OF BREATH 18 g 0   amoxicillin -clavulanate (AUGMENTIN ) 875-125 MG tablet Take 1 tablet by mouth every 12 (twelve) hours. 20 tablet 0   atorvastatin  (LIPITOR) 20 MG tablet Take 1 tablet by mouth once daily 90 tablet 0   benzonatate  (TESSALON ) 100 MG capsule Take 1 capsule (100 mg total) by mouth every 8 (eight) hours. 21 capsule 0   Blood Glucose Monitoring Suppl DEVI 1 each by Does not apply route in the morning, at noon, and at bedtime. May substitute to any manufacturer covered by patient's insurance. 1 each 0   doxycycline  (VIBRAMYCIN ) 100 MG capsule Take 1 capsule (100 mg total) by mouth 2 (two) times daily. 20 capsule 0   FARXIGA  10 MG TABS tablet Take 1 tablet by mouth once daily 90 tablet 0   Glucose Blood (BLOOD GLUCOSE TEST STRIPS) STRP 1 each by In Vitro route 2 (two) times daily. May substitute to any manufacturer covered by patient's insurance. 600  strip 0   lisinopril  (ZESTRIL ) 40 MG tablet Take 1 tablet by mouth once daily 90 tablet 0   predniSONE  (DELTASONE ) 20 MG tablet Take 2 tablets (40 mg total) by mouth daily for 4 days. 8 tablet 0   SYMBICORT  80-4.5 MCG/ACT inhaler Inhale 2 puffs by mouth twice daily 12 g 2   Tiotropium Bromide Monohydrate  (SPIRIVA  RESPIMAT) 2.5 MCG/ACT AERS INHALE 2 SPRAY(S) BY MOUTH ONCE DAILY AT 8 PM 4 g 2   Vitamin D ,  Cholecalciferol, 25 MCG (1000 UT) TABS Take 1 tablet by mouth once daily 90 tablet 0   XARELTO  20 MG TABS tablet TAKE 1 TABLET BY MOUTH ONCE DAILY WITH SUPPER 90 tablet 0   diltiazem  (DILACOR XR ) 120 MG 24 hr capsule Take 1 capsule (120 mg total) by mouth daily. 90 capsule 3   betamethasone  valerate ointment (VALISONE ) 0.1 % Apply 1 Application topically 2 (two) times daily. (Patient not taking: Reported on 01/27/2024) 30 g 0   gabapentin  (NEURONTIN ) 300 MG capsule Take 1 capsule (300 mg total) by mouth 2 (two) times daily. (Patient not taking: Reported on 01/27/2024) 60 capsule 1   methocarbamol  (ROBAXIN ) 500 MG tablet Take 1 tablet (500 mg total) by mouth every 8 (eight) hours as needed for muscle spasms. (Patient not taking: Reported on 01/27/2024) 30 tablet 1   Facility-Administered Medications Prior to Visit  Medication Dose Route Frequency Provider Last Rate Last Admin   denosumab -bbdz (JUBBONTI ) injection 60 mg  60 mg Subcutaneous Once         Allergies[1]  Review of Systems  Constitutional:  Negative for chills and fever.  HENT:  Positive for congestion.   Respiratory:  Positive for cough. Negative for hemoptysis, sputum production, shortness of breath and wheezing.   Cardiovascular:  Negative for chest pain, palpitations and leg swelling.  Gastrointestinal:  Negative for abdominal pain, constipation, diarrhea, nausea and vomiting.  Musculoskeletal:  Negative for falls.  Neurological:  Negative for dizziness, focal weakness and headaches.       Objective:    Physical Exam Constitutional:      General: She is not in acute distress.    Appearance: She is not ill-appearing.     Comments: Nasal cannula in place- on chronic oxygen   HENT:     Mouth/Throat:     Mouth: Mucous membranes are moist.     Pharynx: Oropharynx is clear.  Eyes:     Extraocular Movements: Extraocular movements intact.     Conjunctiva/sclera: Conjunctivae normal.  Cardiovascular:     Rate and Rhythm: Normal  rate and regular rhythm.  Pulmonary:     Effort: Pulmonary effort is normal.     Comments: Decreased lung sounds in bases, course lung sounds on right Musculoskeletal:     Cervical back: Normal range of motion and neck supple.     Right lower leg: No edema.     Left lower leg: No edema.  Skin:    General: Skin is warm and dry.  Neurological:     General: No focal deficit present.     Mental Status: She is alert and oriented to person, place, and time.     Motor: No weakness.     Coordination: Coordination normal.     Gait: Gait normal.  Psychiatric:        Mood and Affect: Mood normal.        Behavior: Behavior normal.        Thought Content: Thought content normal.  BP 112/66   Pulse 90   Temp 97.6 F (36.4 C) (Temporal)   Ht 5' 4 (1.626 m)   LMP 06/21/1973   SpO2 90%   BMI 32.10 kg/m  Wt Readings from Last 3 Encounters:  01/05/24 187 lb (84.8 kg)  12/22/23 187 lb (84.8 kg)  12/04/23 187 lb (84.8 kg)       Assessment & Plan:   Problem List Items Addressed This Visit     Aortic atherosclerosis   COPD GOLD 2 with ex desats   Factor V Leiden   Hypertension   Relevant Orders   CBC with Differential/Platelet (Completed)   Comprehensive metabolic panel with GFR (Completed)   TSH (Completed)   Long term (current) use of anticoagulants   Type 2 diabetes mellitus with chronic kidney disease, without long-term current use of insulin (HCC) - Primary   Relevant Orders   CBC with Differential/Platelet (Completed)   Comprehensive metabolic panel with GFR (Completed)   Hemoglobin A1c (Completed)   TSH (Completed)   Vitamin D  deficiency   Relevant Orders   VITAMIN D  25 Hydroxy (Vit-D Deficiency, Fractures) (Completed)   Other Visit Diagnoses       Community acquired pneumonia of right lower lobe of lung         Pulmonary emphysema (HCC)         Localized osteoporosis with current pathological fracture, initial encounter           Assessment and  Plan Assessment & Plan Community acquired pneumonia Diagnosed on January 2nd, 2025, with symptoms starting on December 31st, 2024. Treated with Augmentin , doxycycline , prednisone , and Tessalon  Perles. Reports improvement in breathing.  - Reviewed UC notes and results.  - Continue current medications as prescribed  Type 2 diabetes mellitus with stage 3a chronic kidney disease A1c was 8.4, indicating suboptimal control. Current blood glucose reading is 151 mg/dL. Farxiga  was added to the regimen at her previous visit.  - Ordered A1c test - Continue Farxiga  as prescribed  Osteoporosis with current pathological fracture Currently on osteoporosis treatment. Bone density test ordered by osteoporosis clinic on December 1st, 2024, but not yet completed. - Call the osteoporosis clinic check on bone density test order.  Vitamin D  deficiency Currently taking vitamin D3, 2000 IU daily. - Ordered vitamin D  level test  Paroxysmal A-fib, factor V Leiden and history of PE Managed with Xarelto  for anticoagulation. Under the care of a cardiologist. - Continue Xarelto      I am having Neziah L. Benito maintain her acetaminophen , Vitamin D  (Cholecalciferol), albuterol , Blood Glucose Monitoring Suppl, BLOOD GLUCOSE TEST STRIPS, gabapentin , Accu-Chek Softclix Lancets, betamethasone  valerate ointment, Spiriva  Respimat, methocarbamol , lisinopril , Farxiga , atorvastatin , Symbicort , Xarelto , amoxicillin -clavulanate, doxycycline , benzonatate , and predniSONE . We will continue to administer denosumab -bbdz.  No orders of the defined types were placed in this encounter.      [1] No Known Allergies  "

## 2024-01-28 ENCOUNTER — Ambulatory Visit: Admitting: Dermatology

## 2024-01-28 ENCOUNTER — Other Ambulatory Visit: Payer: Self-pay | Admitting: Family Medicine

## 2024-01-28 MED ORDER — METFORMIN HCL ER 500 MG PO TB24: 1000.0000 mg | ORAL_TABLET | Freq: Every day | ORAL | 0 refills | Status: AC

## 2024-01-28 NOTE — Progress Notes (Signed)
 Called pt and informed of medication sent. Pt states [facility] texted her and she is going to pick up. Booked 3 month f/u

## 2024-02-03 ENCOUNTER — Ambulatory Visit

## 2024-02-03 ENCOUNTER — Other Ambulatory Visit: Payer: Self-pay | Admitting: Family Medicine

## 2024-02-03 VITALS — Ht 64.0 in | Wt 187.0 lb

## 2024-02-03 DIAGNOSIS — Z0000 Encounter for general adult medical examination without abnormal findings: Secondary | ICD-10-CM

## 2024-02-03 DIAGNOSIS — Z1231 Encounter for screening mammogram for malignant neoplasm of breast: Secondary | ICD-10-CM

## 2024-02-03 DIAGNOSIS — J439 Emphysema, unspecified: Secondary | ICD-10-CM

## 2024-02-03 NOTE — Patient Instructions (Addendum)
 Kimberly Walters,  Thank you for taking the time for your Medicare Wellness Visit. I appreciate your continued commitment to your health goals. Please review the care plan we discussed, and feel free to reach out if I can assist you further.  Please note that Annual Wellness Visits do not include a physical exam. Some assessments may be limited, especially if the visit was conducted virtually. If needed, we may recommend an in-person follow-up with your provider.  Ongoing Care Seeing your primary care provider every 3 to 6 months helps us  monitor your health and provide consistent, personalized care. Next office visit on 05/04/2024.  You are due for a foot exam and will have that done during your last office visit.    Referrals If a referral was made during today's visit and you haven't received any updates within two weeks, please contact the referred provider directly to check on the status. You have an order for:  [x]   3D Mammogram    Please call for appointment:  [facility] [address] [place], [place] [address] [phone]  Make sure to wear two-piece clothing.  No lotions, powders, or deodorants the day of the appointment. Make sure to bring picture ID and insurance card.  Bring list of medications you are currently taking including any supplements.    Recommended Screenings:  Health Maintenance  Topic Date Due   Complete foot exam   Never done   Breast Cancer Screening  05/23/2023   Medicare Annual Wellness Visit  01/29/2024   COVID-19 Vaccine (7 - Pfizer risk 2025-26 season) 04/21/2024   Yearly kidney health urinalysis for diabetes  07/08/2024   Hemoglobin A1C  07/26/2024   Eye exam for diabetics  08/31/2024   Yearly kidney function blood test for diabetes  01/26/2025   Colon Cancer Screening  10/16/2025   DTaP/Tdap/Td vaccine (2 - Td or Tdap) 08/03/2031   Pneumococcal Vaccine for age over 32  Completed   Flu Shot  Completed   Osteoporosis screening  with Bone Density Scan  Completed   Hepatitis C Screening  Completed   Zoster (Shingles) Vaccine  Completed   Meningitis B Vaccine  Aged Out   Screening for Lung Cancer  Discontinued   Stool Blood Test  Discontinued       02/03/2024    9:04 AM  Advanced Directives  Does Patient Have a Medical Advance Directive? Yes  Type of Estate Agent of [place];Living will  Copy of Healthcare Power of Attorney in Chart? No - copy requested    Vision: Annual vision screenings are recommended for early detection of glaucoma, cataracts, and diabetic retinopathy. These exams can also reveal signs of chronic conditions such as diabetes and high blood pressure.  Dental: Annual dental screenings help detect early signs of oral cancer, gum disease, and other conditions linked to overall health, including heart disease and diabetes.  Please see the attached documents for additional preventive care recommendations.

## 2024-02-03 NOTE — Progress Notes (Signed)
 "  Chief Complaint  Patient presents with   Medicare Wellness     Subjective:   Kimberly Walters is a 74 y.o. female who presents for a Medicare Annual Wellness Visit.  Visit info / Clinical Intake: Medicare Wellness Visit Type:: Subsequent Annual Wellness Visit Persons participating in visit and providing information:: patient Medicare Wellness Visit Mode:: Telephone If telephone:: video declined Since this visit was completed virtually, some vitals may be partially provided or unavailable. Missing vitals are due to the limitations of the virtual format.: Unable to obtain vitals - no equipment If Telephone or Video please confirm:: I connected with patient using audio/video enable telemedicine. I verified patient identity with two identifiers, discussed telehealth limitations, and patient agreed to proceed. Patient Location:: home Provider Location:: home Interpreter Needed?: No Pre-visit prep was completed: yes AWV questionnaire completed by patient prior to visit?: no Living arrangements:: with family/others Patient's Overall Health Status Rating: good Typical amount of pain: some Does pain affect daily life?: (!) yes (neck and shoulders) Are you currently prescribed opioids?: no  Dietary Habits and Nutritional Risks How many meals a day?: 3 (sometimes 2) Eats fruit and vegetables daily?: (!) no Most meals are obtained by: preparing own meals In the last 2 weeks, have you had any of the following?: none Diabetic:: (!) yes Any non-healing wounds?: no How often do you check your BS?: 2 Would you like to be referred to a Nutritionist or for Diabetic Management? : no  Functional Status Activities of Daily Living (to include ambulation/medication): Independent Ambulation: Independent with device- listed below Home Assistive Devices/Equipment: Eyeglasses; Walker (specify Type) (uses when out sometimes) Medication Administration: Independent Home Management (perform basic housework  or laundry): Independent Manage your own finances?: yes Primary transportation is: driving Concerns about vision?: no *vision screening is required for WTM* Concerns about hearing?: no  Fall Screening Falls in the past year?: 1 Number of falls in past year: 0 Was there an injury with Fall?: 0 Fall Risk Category Calculator: 0 Patient Fall Risk Level: High fall risk  Fall Risk Patient at Risk for Falls Due to: Impaired balance/gait Fall risk Follow up: Falls evaluation completed; Falls prevention discussed  Home and Transportation Safety: All rugs have non-skid backing?: N/A, no rugs All stairs or steps have railings?: yes (steps outside) Grab bars in the bathtub or shower?: yes Have non-skid surface in bathtub or shower?: yes Good home lighting?: yes Regular seat belt use?: yes Hospital stays in the last year:: no  Cognitive Assessment Difficulty concentrating, remembering, or making decisions? : no Will 6CIT or Mini Cog be Completed: no 6CIT or Mini Cog Declined: patient alert, oriented, able to answer questions appropriately and recall recent events  Advance Directives (For Healthcare) Does Patient Have a Medical Advance Directive?: Yes Type of Advance Directive: Healthcare Power of Convent; Living will Copy of Healthcare Power of Attorney in Chart?: No - copy requested Copy of Living Will in Chart?: No - copy requested Would patient like information on creating a medical advance directive?: No - Patient declined  Reviewed/Updated  Reviewed/Updated: Reviewed All (Medical, Surgical, Family, Medications, Allergies, Care Teams, Patient Goals)    Allergies (verified) Patient has no known allergies.   Current Medications (verified) Outpatient Encounter Medications as of 02/03/2024  Medication Sig   Accu-Chek Softclix Lancets lancets 4 (four) times daily.   acetaminophen  (TYLENOL ) 500 MG tablet Take 500 mg by mouth every 6 (six) hours as needed for moderate pain.    albuterol  (VENTOLIN  HFA) 108 (90  Base) MCG/ACT inhaler INHALE 2 PUFFS BY MOUTH EVERY 6 HOURS AS NEEDED FOR WHEEZING OR SHORTNESS OF BREATH   atorvastatin  (LIPITOR) 20 MG tablet Take 1 tablet by mouth once daily   benzonatate  (TESSALON ) 100 MG capsule Take 1 capsule (100 mg total) by mouth every 8 (eight) hours.   Blood Glucose Monitoring Suppl DEVI 1 each by Does not apply route in the morning, at noon, and at bedtime. May substitute to any manufacturer covered by patient's insurance.   FARXIGA  10 MG TABS tablet Take 1 tablet by mouth once daily   Glucose Blood (BLOOD GLUCOSE TEST STRIPS) STRP 1 each by In Vitro route 2 (two) times daily. May substitute to any manufacturer covered by patient's insurance.   lisinopril  (ZESTRIL ) 40 MG tablet Take 1 tablet by mouth once daily   SYMBICORT  80-4.5 MCG/ACT inhaler Inhale 2 puffs by mouth twice daily   Tiotropium Bromide Monohydrate  (SPIRIVA  RESPIMAT) 2.5 MCG/ACT AERS INHALE 2 SPRAY(S) BY MOUTH ONCE DAILY AT 8 PM   Vitamin D , Cholecalciferol, 25 MCG (1000 UT) TABS Take 1 tablet by mouth once daily   XARELTO  20 MG TABS tablet TAKE 1 TABLET BY MOUTH ONCE DAILY WITH SUPPER   amoxicillin -clavulanate (AUGMENTIN ) 875-125 MG tablet Take 1 tablet by mouth every 12 (twelve) hours. (Patient not taking: Reported on 02/03/2024)   betamethasone  valerate ointment (VALISONE ) 0.1 % Apply 1 Application topically 2 (two) times daily. (Patient not taking: Reported on 01/27/2024)   DILT-XR 120 MG 24 hr capsule Take 1 capsule by mouth once daily   doxycycline  (VIBRAMYCIN ) 100 MG capsule Take 1 capsule (100 mg total) by mouth 2 (two) times daily. (Patient not taking: Reported on 02/03/2024)   gabapentin  (NEURONTIN ) 300 MG capsule Take 1 capsule (300 mg total) by mouth 2 (two) times daily. (Patient not taking: Reported on 01/27/2024)   metFORMIN  (GLUCOPHAGE -XR) 500 MG 24 hr tablet Take 2 tablets (1,000 mg total) by mouth daily with breakfast.   methocarbamol  (ROBAXIN ) 500 MG tablet  Take 1 tablet (500 mg total) by mouth every 8 (eight) hours as needed for muscle spasms. (Patient not taking: Reported on 01/27/2024)   Facility-Administered Encounter Medications as of 02/03/2024  Medication   denosumab -bbdz (JUBBONTI ) injection 60 mg    History: Past Medical History:  Diagnosis Date   Anticoagulant-induced bleeding    Aortic atherosclerosis    Bereavement 11/16/2018   Cardiac arrest (HCC) 1989   pt states her heart stopped and they had to shock her heart and restarted it (w/ Dr. 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    GERD (gastroesophageal reflux disease)    Gross hematuria    History of hiatal hernia    Hyperlipidemia    Hypertension    Mitral valve prolapse    Nodule of lower lobe of left lung    Paroxysmal atrial fibrillation with RVR (HCC)    Pleural effusion on left    Post-thrombotic syndrome of left lower extremity    Primary hypercoagulable state    Pulmonary emboli (HCC)    Pyelonephritis 2021   Upper respiratory infection, viral 04/02/2021   Past Surgical History:  Procedure Laterality Date   BIOPSY  07/19/2020   Procedure: BIOPSY;  Surgeon: Shila Gustav GAILS, MD;  Location: [facility];  Service: Endoscopy;;   CARDIAC CATHETERIZATION     pt states she had one  in 1989 and 1996 w/ Dr. Lavon   COLONOSCOPY     ESOPHAGOGASTRODUODENOSCOPY (EGD) WITH PROPOFOL  N/A 07/19/2020   Procedure: ESOPHAGOGASTRODUODENOSCOPY (EGD) WITH PROPOFOL ;  Surgeon: Shila Gustav GAILS, MD;  Location: [facility];  Service: Endoscopy;  Laterality: N/A;   FUDUCIAL PLACEMENT N/A 03/06/2023   Procedure: PLACEMENT OF FUDUCIAL;  Surgeon: Kerrin Elspeth BROCKS, MD;  Location: [facility] OR;  Service: Thoracic;  Laterality: N/A;   HYSTERECTOMY ABDOMINAL WITH SALPINGO-OOPHORECTOMY  1975   LYMPH NODE BIOPSY Right 1983   Neck   VIDEO BRONCHOSCOPY WITH  ENDOBRONCHIAL NAVIGATION N/A 03/06/2023   Procedure: VIDEO BRONCHOSCOPY WITH ENDOBRONCHIAL NAVIGATION;  Surgeon: Kerrin Elspeth BROCKS, MD;  Location: [facility] OR;  Service: Thoracic;  Laterality: N/A;   Family History  Problem Relation Age of Onset   Diabetes Mother    Heart disease Mother    Cancer Mother        Uterine   Diabetes Father    CAD Father    Heart attack Father    Hypothyroidism Sister    Melanoma Sister    Clotting disorder Sister    Stomach cancer Sister    Diabetes Sister    Diabetes Sister    Hypertension Sister    Hypothyroidism Sister    Diabetes Sister    Hypertension Sister    Hypothyroidism Sister    Cancer Sister    Cancer Sister    Diabetes Daughter    Diabetes Son    Diabetes Son    Breast cancer Half-Sister    Pancreatic cancer Nephew    Uterine cancer Niece    Ovarian cancer Other    Uterine cancer Other    Breast cancer Other    Colon cancer Neg Hx    Esophageal cancer Neg Hx    Social History   Occupational History   Occupation: Retired    Associate Professor: [employer]  Tobacco Use   Smoking status: Former    Current packs/day: 0.00    Average packs/day: 0.5 packs/day for 55.0 years (27.5 ttl pk-yrs)    Types: Cigarettes    Quit date: 07/01/2019    Years since quitting: 4.5   Smokeless tobacco: Never  Vaping Use   Vaping status: Never Used  Substance and Sexual Activity   Alcohol use: No   Drug use: No   Sexual activity: Yes    Birth control/protection: Surgical, Post-menopausal   Tobacco Counseling Counseling given: Not Answered  SDOH Screenings   Food Insecurity: No Food Insecurity (02/03/2024)  Housing: Unknown (02/03/2024)  Transportation Needs: No Transportation Needs (02/03/2024)  Utilities: Not At Risk (02/03/2024)  Alcohol Screen: Low Risk (01/29/2023)  Depression (PHQ2-9): Low Risk (02/03/2024)  Financial Resource Strain: Medium Risk (01/29/2023)  Physical Activity: Insufficiently Active (02/03/2024)  Social Connections: Socially Isolated  (02/03/2024)  Stress: No Stress Concern Present (02/03/2024)  Tobacco Use: Medium Risk (02/03/2024)  Health Literacy: Adequate Health Literacy (02/03/2024)   See flowsheets for full screening details  Depression Screen PHQ 2 & 9 Depression Scale- Over the past 2 weeks, how often have you been bothered by any of the following problems? Little interest or pleasure in doing things: 0 Feeling down, depressed, or hopeless (PHQ Adolescent also includes...irritable): 0 PHQ-2 Total Score: 0 Trouble falling or staying asleep, or sleeping too much: 3 (staying asleep/not new-per pt) Feeling tired or having little energy: 0 Poor appetite or overeating (PHQ Adolescent also includes...weight loss): 0 Feeling bad about yourself - or that you are a failure or have let yourself or your family  down: 0 Trouble concentrating on things, such as reading the newspaper or watching television [facility] Adolescent also includes...like school work): 0 Moving or speaking so slowly that other people could have noticed. Or the opposite - being so fidgety or restless that you have been moving around a lot more than usual: 0 Thoughts that you would be better off dead, or of hurting yourself in some way: 0 PHQ-9 Total Score: 3 If you checked off any problems, how difficult have these problems made it for you to do your work, take care of things at home, or get along with other people?: Not difficult at all  Depression Treatment Depression Interventions/Treatment : [id number] Score <4 Follow-up Not Indicated     Goals Addressed               This Visit's Progress     Patient Stated (pt-stated)        Lose some weight/ still working on this goal/2026             Objective:    Today's Vitals   02/03/24 0851  Weight: 187 lb (84.8 kg)  Height: 5' 4 (1.626 m)   Body mass index is 32.1 kg/m.  Hearing/Vision screen Hearing Screening - Comments:: Denies hearing difficulties   Vision Screening - Comments:: Wears  eyeglasses/[facility]/UTD Immunizations and Health Maintenance Health Maintenance  Topic Date Due   FOOT EXAM  Never done   Mammogram  05/23/2023   COVID-19 Vaccine (7 - Pfizer risk 2025-26 season) 04/21/2024   Diabetic kidney evaluation - Urine ACR  07/08/2024   HEMOGLOBIN A1C  07/26/2024   OPHTHALMOLOGY EXAM  08/31/2024   Diabetic kidney evaluation - eGFR measurement  01/26/2025   Medicare Annual Wellness (AWV)  02/02/2025   Colonoscopy  10/16/2025   DTaP/Tdap/Td (2 - Td or Tdap) 08/03/2031   Pneumococcal Vaccine: 50+ Years  Completed   Influenza Vaccine  Completed   Bone Density Scan  Completed   Hepatitis C Screening  Completed   Zoster Vaccines- Shingrix  Completed   Meningococcal B Vaccine  Aged Out   Lung Cancer Screening  Discontinued   COLON CANCER SCREENING ANNUAL FOBT  Discontinued        Assessment/Plan:  This is a routine wellness examination for Kimberly Walters.  Patient Care Team: [name], NP-C as PCP - General (Family Medicine) O'Neal, Darryle Ned, MD as PCP - Cardiology (Cardiology) Amadeo Windell SAILOR, MD (Inactive) as Consulting Physician (Oncology) Legrand Victory CROME MOULD, MD as Consulting Physician (Gastroenterology) Prentis Duwaine BROCKS, RN as Oncology Nurse Navigator [place] [place], OD (Optometry)  I have personally reviewed and noted the following in the patients chart:   Medical and social history Use of alcohol, tobacco or illicit drugs  Current medications and supplements including opioid prescriptions. Functional ability and status Nutritional status Physical activity Advanced directives List of other physicians Hospitalizations, surgeries, and ER visits in previous 12 months Vitals Screenings to include cognitive, depression, and falls Referrals and appointments  Orders Placed This Encounter  Procedures   MM 3D DIAGNOSTIC MAMMOGRAM BILATERAL BREAST    Call [facility]    Standing Status:   Future    Expiration Date:   04/02/2025    Reason for Exam  (SYMPTOM  OR DIAGNOSIS REQUIRED):   BREAST CANCER SCREENING    Preferred imaging location?:   External   In addition, I have reviewed and discussed with patient certain preventive protocols, quality metrics, and best practice recommendations. A written personalized care plan for  preventive services as well as general preventive health recommendations were provided to patient.   Clotile Whittington L Abel Ra, CMA   02/03/2024   Return in 1 year (on 02/02/2025).  After Visit Summary: (Mail) Due to this being a telephonic visit, the after visit summary with patients personalized plan was offered to patient via mail   Nurse Notes: Patient is due for a foot exam and will have that done during her next office visit with PCP.  She is due for a mammogram and order has been placed today.  Patient had no other concerns to address today. "

## 2024-02-19 ENCOUNTER — Other Ambulatory Visit: Payer: Self-pay | Admitting: Family Medicine

## 2024-02-23 ENCOUNTER — Ambulatory Visit: Admitting: Dermatology

## 2024-03-03 ENCOUNTER — Ambulatory Visit: Admitting: Sports Medicine

## 2024-04-01 ENCOUNTER — Ambulatory Visit: Admitting: Dermatology

## 2024-05-04 ENCOUNTER — Ambulatory Visit: Admitting: Family Medicine

## 2024-06-28 ENCOUNTER — Inpatient Hospital Stay

## 2024-07-06 ENCOUNTER — Inpatient Hospital Stay: Admitting: Internal Medicine
